# Patient Record
Sex: Male | Born: 1935
Health system: Southern US, Community
[De-identification: ages and names within clinical notes are randomized; demographics above are authoritative.]

## PROBLEM LIST (undated history)

## (undated) DIAGNOSIS — N400 Enlarged prostate without lower urinary tract symptoms: Secondary | ICD-10-CM

## (undated) DIAGNOSIS — R0600 Dyspnea, unspecified: Secondary | ICD-10-CM

## (undated) DIAGNOSIS — I499 Cardiac arrhythmia, unspecified: Secondary | ICD-10-CM

## (undated) DIAGNOSIS — N529 Male erectile dysfunction, unspecified: Secondary | ICD-10-CM

## (undated) DIAGNOSIS — E669 Obesity, unspecified: Secondary | ICD-10-CM

## (undated) DIAGNOSIS — I10 Essential (primary) hypertension: Secondary | ICD-10-CM

## (undated) DIAGNOSIS — N189 Chronic kidney disease, unspecified: Secondary | ICD-10-CM

## (undated) DIAGNOSIS — Z8489 Family history of other specified conditions: Secondary | ICD-10-CM

## (undated) DIAGNOSIS — D649 Anemia, unspecified: Secondary | ICD-10-CM

## (undated) DIAGNOSIS — E119 Type 2 diabetes mellitus without complications: Secondary | ICD-10-CM

## (undated) DIAGNOSIS — J189 Pneumonia, unspecified organism: Secondary | ICD-10-CM

## (undated) HISTORY — DX: Benign prostatic hyperplasia without lower urinary tract symptoms: N40.0

## (undated) HISTORY — PX: HERNIA REPAIR: SHX51

## (undated) HISTORY — DX: Cardiac arrhythmia, unspecified: I49.9

## (undated) HISTORY — DX: Obesity, unspecified: E66.9

## (undated) HISTORY — PX: OTHER SURGICAL HISTORY: SHX169

## (undated) HISTORY — DX: Anemia, unspecified: D64.9

## (undated) HISTORY — DX: Type 2 diabetes mellitus without complications: E11.9

## (undated) HISTORY — DX: Chronic kidney disease, unspecified: N18.9

## (undated) HISTORY — DX: Male erectile dysfunction, unspecified: N52.9

---

## 1997-08-13 ENCOUNTER — Emergency Department (HOSPITAL_COMMUNITY): Admission: EM | Admit: 1997-08-13 | Discharge: 1997-08-13 | Payer: Self-pay | Admitting: Emergency Medicine

## 2000-06-03 ENCOUNTER — Encounter: Payer: Self-pay | Admitting: Family Medicine

## 2000-06-03 ENCOUNTER — Ambulatory Visit (HOSPITAL_COMMUNITY): Admission: RE | Admit: 2000-06-03 | Discharge: 2000-06-03 | Payer: Self-pay | Admitting: Family Medicine

## 2001-09-20 ENCOUNTER — Emergency Department (HOSPITAL_COMMUNITY): Admission: EM | Admit: 2001-09-20 | Discharge: 2001-09-20 | Payer: Self-pay | Admitting: *Deleted

## 2001-09-27 ENCOUNTER — Encounter: Payer: Self-pay | Admitting: Family Medicine

## 2001-09-27 ENCOUNTER — Encounter: Admission: RE | Admit: 2001-09-27 | Discharge: 2001-09-27 | Payer: Self-pay | Admitting: Family Medicine

## 2001-10-15 ENCOUNTER — Emergency Department (HOSPITAL_COMMUNITY): Admission: EM | Admit: 2001-10-15 | Discharge: 2001-10-15 | Payer: Self-pay | Admitting: Emergency Medicine

## 2001-10-15 ENCOUNTER — Encounter: Payer: Self-pay | Admitting: *Deleted

## 2001-10-26 ENCOUNTER — Encounter: Admission: RE | Admit: 2001-10-26 | Discharge: 2001-10-26 | Payer: Self-pay | Admitting: Family Medicine

## 2001-10-26 ENCOUNTER — Encounter: Payer: Self-pay | Admitting: Family Medicine

## 2006-04-25 ENCOUNTER — Encounter: Admission: RE | Admit: 2006-04-25 | Discharge: 2006-04-25 | Payer: Self-pay | Admitting: Emergency Medicine

## 2007-12-08 ENCOUNTER — Emergency Department (HOSPITAL_COMMUNITY): Admission: EM | Admit: 2007-12-08 | Discharge: 2007-12-08 | Payer: Self-pay | Admitting: Emergency Medicine

## 2009-02-08 ENCOUNTER — Emergency Department (HOSPITAL_COMMUNITY): Admission: EM | Admit: 2009-02-08 | Discharge: 2009-02-08 | Payer: Self-pay | Admitting: Family Medicine

## 2010-04-18 ENCOUNTER — Emergency Department (HOSPITAL_COMMUNITY): Payer: Medicare Other

## 2010-04-18 ENCOUNTER — Emergency Department (HOSPITAL_COMMUNITY)
Admission: EM | Admit: 2010-04-18 | Discharge: 2010-04-18 | Disposition: A | Discharge status: Home / Self Care | Payer: Medicare Other | Attending: Emergency Medicine | Admitting: Emergency Medicine

## 2010-04-18 DIAGNOSIS — R29898 Other symptoms and signs involving the musculoskeletal system: Secondary | ICD-10-CM | POA: Insufficient documentation

## 2010-04-18 DIAGNOSIS — R5381 Other malaise: Secondary | ICD-10-CM | POA: Insufficient documentation

## 2010-04-18 DIAGNOSIS — M79609 Pain in unspecified limb: Secondary | ICD-10-CM | POA: Insufficient documentation

## 2010-04-18 DIAGNOSIS — I10 Essential (primary) hypertension: Secondary | ICD-10-CM | POA: Insufficient documentation

## 2010-04-18 DIAGNOSIS — R609 Edema, unspecified: Secondary | ICD-10-CM | POA: Insufficient documentation

## 2010-04-18 DIAGNOSIS — E119 Type 2 diabetes mellitus without complications: Secondary | ICD-10-CM | POA: Insufficient documentation

## 2010-04-18 DIAGNOSIS — M6281 Muscle weakness (generalized): Secondary | ICD-10-CM | POA: Insufficient documentation

## 2010-04-18 DIAGNOSIS — M25469 Effusion, unspecified knee: Secondary | ICD-10-CM | POA: Insufficient documentation

## 2010-04-18 DIAGNOSIS — R5383 Other fatigue: Secondary | ICD-10-CM | POA: Insufficient documentation

## 2010-04-18 DIAGNOSIS — Z79899 Other long term (current) drug therapy: Secondary | ICD-10-CM | POA: Insufficient documentation

## 2010-04-18 DIAGNOSIS — M25569 Pain in unspecified knee: Secondary | ICD-10-CM | POA: Insufficient documentation

## 2010-04-18 LAB — DIFFERENTIAL
Basophils Absolute: 0 10*3/uL (ref 0.0–0.1)
Basophils Relative: 0 % (ref 0–1)
Eosinophils Absolute: 0.1 10*3/uL (ref 0.0–0.7)
Eosinophils Relative: 3 % (ref 0–5)
Lymphocytes Relative: 20 % (ref 12–46)
Lymphs Abs: 1.1 10*3/uL (ref 0.7–4.0)
Monocytes Absolute: 0.6 10*3/uL (ref 0.1–1.0)
Monocytes Relative: 10 % (ref 3–12)
Neutro Abs: 3.8 10*3/uL (ref 1.7–7.7)
Neutrophils Relative %: 67 % (ref 43–77)

## 2010-04-18 LAB — CBC
HCT: 38.3 % — ABNORMAL LOW (ref 39.0–52.0)
Hemoglobin: 13.3 g/dL (ref 13.0–17.0)
MCH: 30.5 pg (ref 26.0–34.0)
MCHC: 34.7 g/dL (ref 30.0–36.0)
MCV: 87.8 fL (ref 78.0–100.0)
Platelets: 190 10*3/uL (ref 150–400)
RBC: 4.36 MIL/uL (ref 4.22–5.81)
RDW: 13.1 % (ref 11.5–15.5)
WBC: 5.7 10*3/uL (ref 4.0–10.5)

## 2010-04-18 LAB — BASIC METABOLIC PANEL
BUN: 14 mg/dL (ref 6–23)
CO2: 23 mEq/L (ref 19–32)
Calcium: 8.7 mg/dL (ref 8.4–10.5)
Chloride: 104 mEq/L (ref 96–112)
Creatinine, Ser: 1.09 mg/dL (ref 0.4–1.5)
GFR calc Af Amer: >60 mL/min (ref >60)
GFR calc non Af Amer: >60 mL/min (ref >60)
Glucose, Bld: 162 mg/dL — ABNORMAL HIGH (ref 70–99)
Potassium: 3.9 mEq/L (ref 3.5–5.1)
Sodium: 138 mEq/L (ref 135–145)

## 2010-04-18 LAB — POCT CARDIAC MARKERS
CKMB, poc: 1.4 ng/mL (ref 1.0–8.0)
Myoglobin, poc: 72.9 ng/mL (ref 12–200)
Troponin i, poc: <0.05 ng/mL (ref 0.00–0.09)

## 2010-04-18 LAB — URINALYSIS, ROUTINE W REFLEX MICROSCOPIC
Bilirubin Urine: NEGATIVE
Glucose, UA: 250 mg/dL — AB
Hgb urine dipstick: NEGATIVE
Ketones, ur: NEGATIVE mg/dL
Nitrite: NEGATIVE
Protein, ur: NEGATIVE mg/dL
Specific Gravity, Urine: 1.02 (ref 1.005–1.030)
Urobilinogen, UA: 0.2 mg/dL (ref 0.0–1.0)
pH: 6 (ref 5.0–8.0)

## 2010-04-18 LAB — GLUCOSE, CAPILLARY: Glucose-Capillary: 166 mg/dL — ABNORMAL HIGH (ref 70–99)

## 2013-10-24 ENCOUNTER — Encounter: Payer: Self-pay | Admitting: *Deleted

## 2014-01-02 ENCOUNTER — Ambulatory Visit
Admission: RE | Admit: 2014-01-02 | Discharge: 2014-01-02 | Disposition: A | Discharge status: Home / Self Care | Payer: Medicare Other | Source: Ambulatory Visit | Attending: Family Medicine | Admitting: Family Medicine

## 2014-01-02 ENCOUNTER — Other Ambulatory Visit: Payer: Self-pay | Admitting: Family Medicine

## 2014-01-02 DIAGNOSIS — J690 Pneumonitis due to inhalation of food and vomit: Secondary | ICD-10-CM

## 2014-09-14 ENCOUNTER — Emergency Department (INDEPENDENT_AMBULATORY_CARE_PROVIDER_SITE_OTHER): Payer: Medicare Other

## 2014-09-14 ENCOUNTER — Encounter (HOSPITAL_COMMUNITY): Payer: Self-pay | Admitting: *Deleted

## 2014-09-14 ENCOUNTER — Emergency Department (INDEPENDENT_AMBULATORY_CARE_PROVIDER_SITE_OTHER)
Admission: EM | Admit: 2014-09-14 | Discharge: 2014-09-14 | Disposition: A | Discharge status: Home / Self Care | Payer: Medicare Other | Source: Home / Self Care | Attending: Emergency Medicine | Admitting: Emergency Medicine

## 2014-09-14 DIAGNOSIS — K5900 Constipation, unspecified: Secondary | ICD-10-CM

## 2014-09-14 MED ORDER — POLYETHYLENE GLYCOL 3350 17 GM/SCOOP PO POWD: 17.0000 g | Freq: Three times a day (TID) | ORAL | Status: DC

## 2014-09-14 NOTE — Discharge Instructions (Signed)
Use MiraLAX 3 times a day until you are having diarrhea. At that time, you can back off on the MiraLAX and use it only as needed. You should have a bowel movement tomorrow. Follow-up as needed.

## 2014-09-14 NOTE — ED Notes (Signed)
Pt  denys  Any  Pain  He  denys  Any  Nausea   Or  Vomiting   -  He  States  Has  Had  No  bm  In 8  Days   he  Has   Not   triea  A  Laxative  But  States  He  Tried  An enema   And    Stool   softners     He  Is  Sitting upright on the  Exam table  In no  Severe  Distress

## 2014-09-14 NOTE — ED Provider Notes (Signed)
CSN: [id number]     Arrival date & time 09/14/14  1545 History   First MD Initiated Contact with Patient 09/14/14 1624     Chief Complaint  Patient presents with  . Constipation   (Consider location/radiation/quality/duration/timing/severity/associated sxs/prior Treatment) HPI He is a 81 year old man here with his wife for evaluation of constipation. He states his last bowel movement was 8 days ago. He states it was normal. He reports some intermittent trouble with constipation in the past, but it typically resolves with an over-the-counter stool softener within a few days. He has been taking stool softeners as well as an enema without improvement. He states he was passing gas up until today. He denies passing any gas today. He reports no appetite, but denies nausea or vomiting. He reports feeling quite bloated.  Past Medical History  Diagnosis Date  . Chronic kidney disease   . Diabetes mellitus without complication   . BPH (benign prostatic hyperplasia)   . ED (erectile dysfunction)   . Obesity   . Anemia    Past Surgical History  Procedure Laterality Date  .  left knee arthrocopy     Family History  Problem Relation Age of Onset  . CVA Father   . Hypertension Father   . Leukemia Sister   . Thyroid disease Sister   . Cancer Brother    Social History  Substance Use Topics  . Smoking status: Former Games developer  . Smokeless tobacco: None  . Alcohol Use: No    Review of Systems As in history of present illness Allergies  Hydrochlorothiazide; Lipitor; and Viagra  Home Medications   Prior to Admission medications   Medication Sig Start Date End Date Taking? Authorizing Provider  amLODipine (NORVASC) 10 MG tablet Take 10 mg by mouth daily.    Historical Provider, MD  aspirin 81 MG chewable tablet Chew by mouth daily.    Historical Provider, MD  benazepril (LOTENSIN) 40 MG tablet Take 40 mg by mouth daily.    Historical Provider, MD  cyclobenzaprine (FLEXERIL) 5 MG tablet Take  5 mg by mouth 3 (three) times daily as needed for muscle spasms.    Historical Provider, MD  glipiZIDE (GLUCOTROL) 10 MG tablet Take 10 mg by mouth daily before breakfast.    Historical Provider, MD  metFORMIN (GLUCOPHAGE) 500 MG tablet Take by mouth 2 (two) times daily with a meal.    Historical Provider, MD  polyethylene glycol powder (GLYCOLAX/MIRALAX) powder Take 17 g by mouth 3 (three) times daily. Until having diarrhea; then use daily as needed. 09/14/14   Charm Rings, MD  rosuvastatin (CRESTOR) 20 MG tablet Take 20 mg by mouth daily.    Historical Provider, MD  tadalafil (CIALIS) 5 MG tablet Take 5 mg by mouth daily as needed for erectile dysfunction.    Historical Provider, MD  vardenafil (LEVITRA) 10 MG tablet Take 10 mg by mouth daily as needed for erectile dysfunction.    Historical Provider, MD  zolpidem (AMBIEN) 10 MG tablet Take 10 mg by mouth at bedtime as needed for sleep.    Historical Provider, MD   Meds Ordered and Administered this Visit  Medications - No data to display  BP 151/68 mmHg  Pulse 71  Temp(Src) 98 F (36.7 C) (Oral)  Resp 20  SpO2 98% No data found.   Physical Exam  Constitutional: He is oriented to person, place, and time. He appears well-developed and well-nourished. No distress.  Neck: Neck supple.  Cardiovascular: Normal rate.  Pulmonary/Chest: Effort normal.  Abdominal: Soft. Bowel sounds are normal. He exhibits distension. He exhibits no mass. There is no tenderness. There is no rebound and no guarding.  Slightly high-pitched bowel sounds  Neurological: He is alert and oriented to person, place, and time.    ED Course  Procedures (including critical care time)  Labs Review Labs Reviewed - No data to display  Imaging Review Dg Abd Acute W/chest  09/14/2014   CLINICAL DATA:  Constipation  EXAM: DG ABDOMEN ACUTE W/ 1V CHEST  COMPARISON:  Chest x-ray 01/02/2014  FINDINGS: Cardiac enlargement. Uncoiled aorta. Negative for heart failure. Negative  for mass or pneumonia. Lungs are clear  Negative for bowel obstruction. Moderate retained stool throughout the colon. No air-fluid levels of bowel. No free air. No urinary tract calcifications. No acute skeletal abnormality.  IMPRESSION: Cardiac enlargement.  No acute cardiopulmonary abnormality  Constipation without bowel obstruction.   Electronically Signed   By: Marlan Palau M.D.   On: 09/14/2014 17:24      MDM   1. Constipation, unspecified constipation type    Treat with MiraLAX. We did discuss appropriate use of an enema if needed. Follow-up as needed.    Charm Rings, MD 09/14/14 [phone]

## 2014-11-27 ENCOUNTER — Other Ambulatory Visit: Payer: Self-pay | Admitting: Urology

## 2014-11-28 ENCOUNTER — Other Ambulatory Visit (HOSPITAL_COMMUNITY): Payer: Self-pay | Admitting: *Deleted

## 2014-11-28 NOTE — Patient Instructions (Addendum)
Thomas BraceRobert L Boehne  11/28/2014   Your procedure is scheduled on: Thursday 12-12-14  [facility]  [facility] 3[facility] floor to  Short Stay Center at 700 AM.  Call this number if you have problems the morning of surgery [phone]   Remember: ONLY 1 PERSON MAY GO WITH YOU TO SHORT STAY TO GET  READY MORNING OF YOUR SURGERY.  Do not eat food or drink liquids :After Midnight.     Take these medicines the morning of surgery with A SIP OF WATER: Amlodipine (Norvasc), Rosuvastatin (Crestor) DO NOT TAKE ANY DIABETIC MEDICATIONS DAY OF YOUR SURGERY                               You may not have any metal on your body including hair pins and              piercings  Do not wear jewelry, make-up, lotions, powders or perfumes, deodorant             Do not wear nail polish.  Do not shave  48 hours prior to surgery.              Men may shave face and neck.   Do not bring valuables to the hospital. [facility] IS NOT             RESPONSIBLE   FOR VALUABLES.  Contacts, dentures or bridgework may not be worn into surgery.  Leave suitcase in the car. After surgery it may be brought to your room.     Patients discharged the day of surgery will not be allowed to drive home.  Name and phone number of your driver:  Special Instructions: practice deep breathing and leg exercises              Please read over the following fact sheets you were given: _____________________________________________________________________             [facility] - Preparing for [facility] Before surgery, you can play an important role.  Because skin is not sterile, your skin needs to be as free of germs as possible.  You can reduce the number of germs on your skin by washing with CHG (chlorahexidine gluconate) soap before surgery.  CHG is an antiseptic cleaner which kills germs and bonds with the skin to continue killing germs even after washing. Please DO NOT use if  you have an allergy to CHG or antibacterial soaps.  If your skin becomes reddened/irritated stop using the CHG and inform your nurse when you arrive at Short Stay. Do not shave (including legs and underarms) for at least 48 hours prior to the first CHG shower.  You may shave your face/neck. Please follow these instructions carefully:  1.  Shower with CHG Soap the night before surgery and the  morning of Surgery.  2.  If you choose to wash your hair, wash your hair first as usual with your  normal  shampoo.  3.  After you shampoo, rinse your hair and body thoroughly to remove the  shampoo.                           4.  Use CHG as you would any other liquid soap.  You can apply chg  directly  to the skin and wash                       Gently with a scrungie or clean washcloth.  5.  Apply the CHG Soap to your body ONLY FROM THE NECK DOWN.   Do not use on face/ open                           Wound or open sores. Avoid contact with eyes, ears mouth and genitals (private parts).                       Wash face,  Genitals (private parts) with your normal soap.             6.  Wash thoroughly, paying special attention to the area where your surgery  will be performed.  7.  Thoroughly rinse your body with warm water from the neck down.  8.  DO NOT shower/wash with your normal soap after using and rinsing off  the CHG Soap.                9.  Pat yourself dry with a clean towel.            10.  Wear clean pajamas.            11.  Place clean sheets on your bed the night of your first shower and do not  sleep with pets. Day of Surgery : Do not apply any lotions/deodorants the morning of surgery.  Please wear clean clothes to the hospital/surgery center.  FAILURE TO FOLLOW THESE INSTRUCTIONS MAY RESULT IN THE CANCELLATION OF YOUR SURGERY PATIENT SIGNATURE_________________________________  NURSE  SIGNATURE__________________________________  ________________________________________________________________________  WHAT IS A BLOOD TRANSFUSION? Blood Transfusion Information  A transfusion is the replacement of blood or some of its parts. Blood is made up of multiple cells which provide different functions.  Red blood cells carry oxygen and are used for blood loss replacement.  White blood cells fight against infection.  Platelets control bleeding.  Plasma helps clot blood.  Other blood products are available for specialized needs, such as hemophilia or other clotting disorders. BEFORE THE TRANSFUSION  Who gives blood for transfusions?   Healthy volunteers who are fully evaluated to make sure their blood is safe. This is blood bank blood. Transfusion therapy is the safest it has ever been in the practice of medicine. Before blood is taken from a donor, a complete history is taken to make sure that person has no history of diseases nor engages in risky social behavior (examples are intravenous drug use or sexual activity with multiple partners). The donor's travel history is screened to minimize risk of transmitting infections, such as malaria. The donated blood is tested for signs of infectious diseases, such as HIV and hepatitis. The blood is then tested to be sure it is compatible with you in order to minimize the chance of a transfusion reaction. If you or a relative donates blood, this is often done in anticipation of surgery and is not appropriate for emergency situations. It takes many days to process the donated blood. RISKS AND COMPLICATIONS Although transfusion therapy is very safe and saves many lives, the main dangers of transfusion include:   Getting an infectious disease.  Developing a transfusion reaction. This is an allergic reaction to something in the blood you were given. Every precaution is taken  to prevent this. The decision to have a blood transfusion has been  considered carefully by your caregiver before blood is given. Blood is not given unless the benefits outweigh the risks. AFTER THE TRANSFUSION  Right after receiving a blood transfusion, you will usually feel much better and more energetic. This is especially true if your red blood cells have gotten low (anemic). The transfusion raises the level of the red blood cells which carry oxygen, and this usually causes an energy increase.  The nurse administering the transfusion will monitor you carefully for complications. HOME CARE INSTRUCTIONS  No special instructions are needed after a transfusion. You may find your energy is better. Speak with your caregiver about any limitations on activity for underlying diseases you may have. SEEK MEDICAL CARE IF:   Your condition is not improving after your transfusion.  You develop redness or irritation at the intravenous (IV) site. SEEK IMMEDIATE MEDICAL CARE IF:  Any of the following symptoms occur over the next 12 hours:  Shaking chills.  You have a temperature by mouth above 102 F (38.9 C), not controlled by medicine.  Chest, back, or muscle pain.  People around you feel you are not acting correctly or are confused.  Shortness of breath or difficulty breathing.  Dizziness and fainting.  You get a rash or develop hives.  You have a decrease in urine output.  Your urine turns a dark color or changes to pink, red, or brown. Any of the following symptoms occur over the next 10 days:  You have a temperature by mouth above 102 F (38.9 C), not controlled by medicine.  Shortness of breath.  Weakness after normal activity.  The white part of the eye turns yellow (jaundice).  You have a decrease in the amount of urine or are urinating less often.  Your urine turns a dark color or changes to pink, red, or brown. Document Released: 12/26/1999 Document Revised: 03/22/2011 Document Reviewed: 08/14/2007 [facility] Patient Information 2014  [place], [place].  _______________________________________________________________________

## 2014-11-29 ENCOUNTER — Encounter (HOSPITAL_COMMUNITY)
Admission: RE | Admit: 2014-11-29 | Discharge: 2014-11-29 | Disposition: A | Discharge status: Home / Self Care | Payer: Medicare Other | Source: Ambulatory Visit | Attending: Urology | Admitting: Urology

## 2014-11-29 ENCOUNTER — Encounter (HOSPITAL_COMMUNITY): Payer: Self-pay

## 2014-11-29 DIAGNOSIS — Z01812 Encounter for preprocedural laboratory examination: Secondary | ICD-10-CM | POA: Diagnosis present

## 2014-11-29 HISTORY — DX: Pneumonia, unspecified organism: J18.9

## 2014-11-29 HISTORY — DX: Essential (primary) hypertension: I10

## 2014-11-29 HISTORY — DX: Family history of other specified conditions: Z84.89

## 2014-11-29 LAB — CBC
HCT: 39.6 % (ref 39.0–52.0)
Hemoglobin: 13.6 g/dL (ref 13.0–17.0)
MCH: 31 pg (ref 26.0–34.0)
MCHC: 34.3 g/dL (ref 30.0–36.0)
MCV: 90.2 fL (ref 78.0–100.0)
Platelets: 204 10*3/uL (ref 150–400)
RBC: 4.39 MIL/uL (ref 4.22–5.81)
RDW: 13.5 % (ref 11.5–15.5)
WBC: 4.5 10*3/uL (ref 4.0–10.5)

## 2014-11-29 NOTE — Progress Notes (Signed)
   11/29/14 0813  OBSTRUCTIVE SLEEP APNEA  Have you ever been diagnosed with sleep apnea through a sleep study? No  Do you snore loudly (loud enough to be heard through closed doors)?  1  Do you often feel tired, fatigued, or sleepy during the daytime (such as falling asleep during driving or talking to someone)? 0  Has anyone observed you stop breathing during your sleep? 1  Do you have, or are you being treated for high blood pressure? 1  BMI more than 35 kg/m2? 1  Age > 50 (1-yes) 1  Neck circumference greater than:Male 16 inches or larger, Male 17inches or larger? 1  Male Gender (Yes=1) 1  Obstructive Sleep Apnea Score 7

## 2014-11-29 NOTE — Pre-Procedure Instructions (Addendum)
CXR 09-14-14 epic EKG 11-18-14 on chart CMET 11-18-14 on chart Hgb A1C, Lipid, TSH 11-18-14 on chart  Will have to get T&S morning of surgery. Pre-op appt is >10 days. Stop Bang score is 7. Routed results to PCP.

## 2014-12-13 NOTE — Progress Notes (Signed)
Spoke with patient by phone, patient aware surgery date changed to December 15th 2016 npo after midnight arrive 1030 am [facility] short stay take amlodipine and crestor sip of water in am.

## 2014-12-26 ENCOUNTER — Encounter (HOSPITAL_COMMUNITY)
Admission: RE | Disposition: A | Discharge status: Home / Self Care | Payer: Self-pay | Source: Ambulatory Visit | Attending: Urology

## 2014-12-26 ENCOUNTER — Encounter (HOSPITAL_COMMUNITY): Payer: Self-pay | Admitting: *Deleted

## 2014-12-26 ENCOUNTER — Ambulatory Visit (HOSPITAL_COMMUNITY): Payer: Medicare Other | Admitting: Registered Nurse

## 2014-12-26 ENCOUNTER — Observation Stay (HOSPITAL_COMMUNITY)
Admission: RE | Admit: 2014-12-26 | Discharge: 2014-12-28 | Disposition: A | Discharge status: Home / Self Care | Payer: Medicare Other | Source: Ambulatory Visit | Attending: Urology | Admitting: Urology

## 2014-12-26 DIAGNOSIS — Z7984 Long term (current) use of oral hypoglycemic drugs: Secondary | ICD-10-CM | POA: Insufficient documentation

## 2014-12-26 DIAGNOSIS — Z7982 Long term (current) use of aspirin: Secondary | ICD-10-CM | POA: Insufficient documentation

## 2014-12-26 DIAGNOSIS — T83091A Other mechanical complication of indwelling urethral catheter, initial encounter: Secondary | ICD-10-CM | POA: Diagnosis not present

## 2014-12-26 DIAGNOSIS — Z79899 Other long term (current) drug therapy: Secondary | ICD-10-CM | POA: Diagnosis not present

## 2014-12-26 DIAGNOSIS — R338 Other retention of urine: Secondary | ICD-10-CM | POA: Diagnosis not present

## 2014-12-26 DIAGNOSIS — N400 Enlarged prostate without lower urinary tract symptoms: Secondary | ICD-10-CM | POA: Diagnosis present

## 2014-12-26 DIAGNOSIS — N401 Enlarged prostate with lower urinary tract symptoms: Secondary | ICD-10-CM | POA: Diagnosis not present

## 2014-12-26 DIAGNOSIS — E1122 Type 2 diabetes mellitus with diabetic chronic kidney disease: Secondary | ICD-10-CM | POA: Diagnosis not present

## 2014-12-26 DIAGNOSIS — R109 Unspecified abdominal pain: Secondary | ICD-10-CM | POA: Diagnosis present

## 2014-12-26 DIAGNOSIS — N189 Chronic kidney disease, unspecified: Secondary | ICD-10-CM | POA: Diagnosis not present

## 2014-12-26 DIAGNOSIS — I129 Hypertensive chronic kidney disease with stage 1 through stage 4 chronic kidney disease, or unspecified chronic kidney disease: Secondary | ICD-10-CM | POA: Insufficient documentation

## 2014-12-26 DIAGNOSIS — Z87891 Personal history of nicotine dependence: Secondary | ICD-10-CM | POA: Diagnosis not present

## 2014-12-26 HISTORY — PX: TRANSURETHRAL RESECTION OF PROSTATE: SHX73

## 2014-12-26 LAB — CBC
HCT: 37 % — ABNORMAL LOW (ref 39.0–52.0)
Hemoglobin: 12.4 g/dL — ABNORMAL LOW (ref 13.0–17.0)
MCH: 30.9 pg (ref 26.0–34.0)
MCHC: 33.5 g/dL (ref 30.0–36.0)
MCV: 92.3 fL (ref 78.0–100.0)
Platelets: 186 10*3/uL (ref 150–400)
RBC: 4.01 MIL/uL — ABNORMAL LOW (ref 4.22–5.81)
RDW: 13.5 % (ref 11.5–15.5)
WBC: 3.6 10*3/uL — ABNORMAL LOW (ref 4.0–10.5)

## 2014-12-26 LAB — BASIC METABOLIC PANEL
Anion gap: 9 (ref 5–15)
BUN: 19 mg/dL (ref 6–20)
CO2: 26 mmol/L (ref 22–32)
Calcium: 8.5 mg/dL — ABNORMAL LOW (ref 8.9–10.3)
Chloride: 108 mmol/L (ref 101–111)
Creatinine, Ser: 1.35 mg/dL — ABNORMAL HIGH (ref 0.61–1.24)
GFR calc Af Amer: 56 mL/min — ABNORMAL LOW (ref >60)
GFR calc non Af Amer: 48 mL/min — ABNORMAL LOW (ref >60)
Glucose, Bld: 111 mg/dL — ABNORMAL HIGH (ref 65–99)
Potassium: 4 mmol/L (ref 3.5–5.1)
Sodium: 143 mmol/L (ref 135–145)

## 2014-12-26 LAB — GLUCOSE, CAPILLARY
Glucose-Capillary: 119 mg/dL — ABNORMAL HIGH (ref 65–99)
Glucose-Capillary: 101 mg/dL — ABNORMAL HIGH (ref 65–99)
Glucose-Capillary: 114 mg/dL — ABNORMAL HIGH (ref 65–99)
Glucose-Capillary: 210 mg/dL — ABNORMAL HIGH (ref 65–99)

## 2014-12-26 SURGERY — TRANSURETHRAL RESECTION OF THE PROSTATE WITH GYRUS INSTRUMENTS
Anesthesia: General

## 2014-12-26 MED ORDER — ROSUVASTATIN CALCIUM 20 MG PO TABS
20.0000 mg | ORAL_TABLET | Freq: Every day | ORAL | Status: DC
  Administered 2014-12-26 – 2014-12-27 (×2): 20 mg via ORAL
  Filled 2014-12-26 (×2): qty 1

## 2014-12-26 MED ORDER — INSULIN ASPART 100 UNIT/ML ~~LOC~~ SOLN
0.0000 [IU] | Freq: Three times a day (TID) | SUBCUTANEOUS | Status: DC
  Administered 2014-12-27: 2 [IU] via SUBCUTANEOUS
  Administered 2014-12-27 (×2): 3 [IU] via SUBCUTANEOUS
  Administered 2014-12-28 (×2): 2 [IU] via SUBCUTANEOUS

## 2014-12-26 MED ORDER — ZOLPIDEM TARTRATE 5 MG PO TABS: 5.0000 mg | ORAL_TABLET | Freq: Every evening | ORAL | Status: DC | PRN

## 2014-12-26 MED ORDER — ASPIRIN 81 MG PO CHEW
81.0000 mg | CHEWABLE_TABLET | Freq: Every day | ORAL | Status: DC
Start: 2014-12-27 — End: 2014-12-28
  Administered 2014-12-28: 81 mg via ORAL
  Filled 2014-12-26: qty 1

## 2014-12-26 MED ORDER — PROPOFOL 10 MG/ML IV BOLUS
INTRAVENOUS | Status: DC | PRN
  Administered 2014-12-26: 200 mg via INTRAVENOUS

## 2014-12-26 MED ORDER — FENTANYL CITRATE (PF) 100 MCG/2ML IJ SOLN
INTRAMUSCULAR | Status: AC
  Filled 2014-12-26: qty 2

## 2014-12-26 MED ORDER — DEXTROSE 5 % IV SOLN
2.0000 g | INTRAVENOUS | Status: DC
  Administered 2014-12-27 – 2014-12-28 (×2): 2 g via INTRAVENOUS
  Filled 2014-12-26 (×2): qty 2

## 2014-12-26 MED ORDER — ONDANSETRON HCL 4 MG/2ML IJ SOLN
4.0000 mg | INTRAMUSCULAR | Status: DC | PRN
  Administered 2014-12-26 – 2014-12-27 (×2): 4 mg via INTRAVENOUS
  Filled 2014-12-26 (×2): qty 2

## 2014-12-26 MED ORDER — SODIUM CHLORIDE 0.9 % IV SOLN
INTRAVENOUS | Status: DC
  Administered 2014-12-26 – 2014-12-28 (×2): via INTRAVENOUS

## 2014-12-26 MED ORDER — DIPHENHYDRAMINE HCL 50 MG/ML IJ SOLN: 12.5000 mg | Freq: Four times a day (QID) | INTRAMUSCULAR | Status: DC | PRN

## 2014-12-26 MED ORDER — LIDOCAINE HCL (CARDIAC) 20 MG/ML IV SOLN
INTRAVENOUS | Status: AC
  Filled 2014-12-26: qty 5

## 2014-12-26 MED ORDER — FENTANYL CITRATE (PF) 100 MCG/2ML IJ SOLN
INTRAMUSCULAR | Status: DC | PRN
  Administered 2014-12-26: 50 ug via INTRAVENOUS
  Administered 2014-12-26: 25 ug via INTRAVENOUS
  Administered 2014-12-26: 50 ug via INTRAVENOUS
  Administered 2014-12-26: 25 ug via INTRAVENOUS

## 2014-12-26 MED ORDER — DIPHENHYDRAMINE HCL 12.5 MG/5ML PO ELIX: 12.5000 mg | ORAL_SOLUTION | Freq: Four times a day (QID) | ORAL | Status: DC | PRN

## 2014-12-26 MED ORDER — LACTATED RINGERS IV SOLN: INTRAVENOUS | Status: DC

## 2014-12-26 MED ORDER — HYDROMORPHONE HCL 1 MG/ML IJ SOLN
0.5000 mg | INTRAMUSCULAR | Status: DC | PRN
  Administered 2014-12-26: 0.5 mg via INTRAVENOUS
  Filled 2014-12-26: qty 1

## 2014-12-26 MED ORDER — ONDANSETRON HCL 4 MG/2ML IJ SOLN
INTRAMUSCULAR | Status: AC
  Filled 2014-12-26: qty 2

## 2014-12-26 MED ORDER — DEXTROSE 5 % IV SOLN
20.0000 mg | INTRAVENOUS | Status: DC | PRN
  Administered 2014-12-26: 10 ug/min via INTRAVENOUS

## 2014-12-26 MED ORDER — OXYCODONE-ACETAMINOPHEN 5-325 MG PO TABS
1.0000 | ORAL_TABLET | ORAL | Status: DC | PRN
  Administered 2014-12-27: 2 via ORAL
  Filled 2014-12-26: qty 2

## 2014-12-26 MED ORDER — ACETAMINOPHEN 325 MG PO TABS
650.0000 mg | ORAL_TABLET | ORAL | Status: DC | PRN
  Administered 2014-12-28: 650 mg via ORAL

## 2014-12-26 MED ORDER — MENTHOL 3 MG MT LOZG
1.0000 | LOZENGE | OROMUCOSAL | Status: DC | PRN
  Filled 2014-12-26 (×2): qty 9

## 2014-12-26 MED ORDER — BENAZEPRIL HCL 10 MG PO TABS
40.0000 mg | ORAL_TABLET | Freq: Every day | ORAL | Status: DC
  Administered 2014-12-26 – 2014-12-28 (×2): 40 mg via ORAL
  Filled 2014-12-26 (×2): qty 4

## 2014-12-26 MED ORDER — SODIUM CHLORIDE 0.9 % IR SOLN
Status: DC | PRN
  Administered 2014-12-26: 18000 mL

## 2014-12-26 MED ORDER — PROPOFOL 10 MG/ML IV BOLUS
INTRAVENOUS | Status: AC
  Filled 2014-12-26: qty 20

## 2014-12-26 MED ORDER — FENTANYL CITRATE (PF) 100 MCG/2ML IJ SOLN: 25.0000 ug | INTRAMUSCULAR | Status: DC | PRN

## 2014-12-26 MED ORDER — ONDANSETRON HCL 4 MG/2ML IJ SOLN
INTRAMUSCULAR | Status: DC | PRN
  Administered 2014-12-26: 4 mg via INTRAVENOUS

## 2014-12-26 MED ORDER — EPHEDRINE SULFATE 50 MG/ML IJ SOLN
INTRAMUSCULAR | Status: DC | PRN
  Administered 2014-12-26 (×2): 5 mg via INTRAVENOUS

## 2014-12-26 MED ORDER — LACTATED RINGERS IV SOLN
INTRAVENOUS | Status: DC | PRN
  Administered 2014-12-26: 12:00:00 via INTRAVENOUS

## 2014-12-26 MED ORDER — AMLODIPINE BESYLATE 10 MG PO TABS
10.0000 mg | ORAL_TABLET | Freq: Every day | ORAL | Status: DC
  Administered 2014-12-26 – 2014-12-28 (×2): 10 mg via ORAL
  Filled 2014-12-26 (×2): qty 1

## 2014-12-26 MED ORDER — LIDOCAINE HCL (CARDIAC) 20 MG/ML IV SOLN
INTRAVENOUS | Status: DC | PRN
  Administered 2014-12-26: 100 mg via INTRAVENOUS

## 2014-12-26 MED ORDER — DEXTROSE 5 % IV SOLN
1.0000 g | INTRAVENOUS | Status: AC
  Administered 2014-12-26: 1 g via INTRAVENOUS
  Filled 2014-12-26: qty 10

## 2014-12-26 MED ORDER — BELLADONNA ALKALOIDS-OPIUM 16.2-60 MG RE SUPP: 1.0000 | Freq: Four times a day (QID) | RECTAL | Status: DC | PRN

## 2014-12-26 SURGICAL SUPPLY — 14 items
BAG URINE DRAINAGE (UROLOGICAL SUPPLIES) ×2 IMPLANT
BAG URO CATCHER STRL LF (MISCELLANEOUS) ×2 IMPLANT
CATH FOLEY 3WAY 30CC 22FR (CATHETERS) ×2 IMPLANT
GLOVE BIO SURGEON STRL SZ8 (GLOVE) ×6 IMPLANT
GOWN STRL REUS W/TWL LRG LVL3 (GOWN DISPOSABLE) ×4 IMPLANT
HOLDER FOLEY CATH W/STRAP (MISCELLANEOUS) IMPLANT
IV NS IRRIG 3000ML ARTHROMATIC (IV SOLUTION) ×4 IMPLANT
LOOP CUT BIPOLAR 24F LRG (ELECTROSURGICAL) ×2 IMPLANT
MANIFOLD NEPTUNE II (INSTRUMENTS) ×2 IMPLANT
PACK CYSTO (CUSTOM PROCEDURE TRAY) ×2 IMPLANT
PLUG CATH AND CAP STER (CATHETERS) ×2 IMPLANT
SYR 30ML LL (SYRINGE) ×2 IMPLANT
SYRINGE IRR TOOMEY STRL 70CC (SYRINGE) ×2 IMPLANT
TUBING CONNECTING 10 (TUBING) ×2 IMPLANT

## 2014-12-26 NOTE — Brief Op Note (Signed)
12/26/2014  2:44 PM  PATIENT:  Almyra Braceobert L Keehan  81 y.o. male  PRE-OPERATIVE DIAGNOSIS:  BENIGN PROSTATIC HYPERPLASIA  POST-OPERATIVE DIAGNOSIS:  BPH  PROCEDURE:  Procedure(s): TRANSURETHRAL RESECTION OF THE PROSTATE (N/A)  SURGEON:  Surgeon(s) and Role:    * Malen GauzePatrick L Lutie Pickler, MD - Primary  PHYSICIAN ASSISTANT:   ASSISTANTS: none   ANESTHESIA:   general  EBL:  Total I/O In: 1250 [I.V.:1200; IV Piggyback:50] Out: 100 [Blood:100]  BLOOD ADMINISTERED:none  DRAINS: Urinary Catheter (Foley)   LOCAL MEDICATIONS USED:  NONE  SPECIMEN:  Source of Specimen:  prostate chips  DISPOSITION OF SPECIMEN:  PATHOLOGY  COUNTS:  YES  TOURNIQUET:  * No tourniquets in log *  DICTATION: .Note written in EPIC  PLAN OF CARE: Admit for overnight observation  PATIENT DISPOSITION:  PACU - hemodynamically stable.   Delay start of Pharmacological VTE agent (>24hrs) due to surgical blood loss or risk of bleeding: not applicable

## 2014-12-26 NOTE — H&P (Signed)
Urology Admission H&P  Chief Complaint: suprapubic pain  History of Present Illness: Mr Thomas Joseph is a 81yo with a hx of BPH with LUTS and urinary retention here for TURP. He has severe LUTS which has been refractory to medical management.   Past Medical History  Diagnosis Date  . Diabetes mellitus without complication (HCC)   . BPH (benign prostatic hyperplasia)   . ED (erectile dysfunction)   . Obesity   . Anemia   . Family history of adverse reaction to anesthesia     son had some problem years ago, pt unsure of what exact problem was  . Hypertension   . Pneumonia   . Chronic kidney disease    Past Surgical History  Procedure Laterality Date  .  left knee arthrocopy    . Hernia repair      Home Medications:  Prescriptions prior to admission  Medication Sig Dispense Refill Last Dose  . amLODipine (NORVASC) 10 MG tablet Take 10 mg by mouth daily.   12/25/2014 at 2300  . aspirin 81 MG chewable tablet Chew 81 mg by mouth daily.    12/12/2014  . benazepril (LOTENSIN) 40 MG tablet Take 40 mg by mouth daily.   12/25/2014 at 2300  . cetirizine (ZYRTEC) 10 MG tablet Take 10 mg by mouth daily as needed for allergies.   Past Week at Unknown time  . COENZYME Q-10 PO Take 1 capsule by mouth daily.   12/12/2014  . glipiZIDE (GLUCOTROL XL) 10 MG 24 hr tablet Take 10 mg by mouth daily with breakfast.   Past Week at Unknown time  . metFORMIN (GLUCOPHAGE-XR) 500 MG 24 hr tablet Take 500 mg by mouth daily with supper.   12/25/2014 at 2300  . polyethylene glycol powder (GLYCOLAX/MIRALAX) powder Take 17 g by mouth 3 (three) times daily. Until having diarrhea; then use daily as needed. (Patient taking differently: Take 17 g by mouth daily as needed (constipation). Until having diarrhea; then use daily as needed.) 255 g 0 12/19/2014  . rosuvastatin (CRESTOR) 20 MG tablet Take 20 mg by mouth at bedtime.    12/19/2014  . tamsulosin (FLOMAX) 0.4 MG CAPS capsule Take 0.4 mg by mouth 2 (two) times daily.    12/25/2014 at Unknown time  . zolpidem (AMBIEN) 10 MG tablet Take 5-10 mg by mouth at bedtime as needed for sleep.    12/24/2014  . cyclobenzaprine (FLEXERIL) 5 MG tablet Take 5 mg by mouth 3 (three) times daily as needed for muscle spasms.   More than a month at Unknown time   Allergies:  Allergies  Allergen Reactions  . Hydrochlorothiazide     Unknown rxn  . Lipitor [Atorvastatin] Other (See Comments)    Muscle pain  . Viagra [Sildenafil Citrate] Palpitations    Family History  Problem Relation Age of Onset  . CVA Father   . Hypertension Father   . Leukemia Sister   . Thyroid disease Sister   . Cancer Brother    Social History:  reports that he quit smoking about 30 years ago. He has never used smokeless tobacco. He reports that he drinks alcohol. He reports that he does not use illicit drugs.  Review of Systems  Genitourinary: Positive for urgency and frequency.  All other systems reviewed and are negative.   Physical Exam:  Vital signs in last 24 hours: Temp:  [97.8 F (36.6 C)] 97.8 F (36.6 C) (12/15 1026) Pulse Rate:  [78] 78 (12/15 1026) Resp:  [18] 18 (12/15  1026) BP: (148)/(64) 148/64 mmHg (12/15 1026) SpO2:  [100 %] 100 % (12/15 1026) Weight:  [118.389 kg (261 lb)] 118.389 kg (261 lb) (12/15 1049) Physical Exam  Constitutional: He is oriented to person, place, and time. He appears well-developed and well-nourished.  HENT:  Head: Normocephalic and atraumatic.  Eyes: EOM are normal. Pupils are equal, round, and reactive to light.  Neck: Normal range of motion. No thyromegaly present.  Cardiovascular: Normal rate and regular rhythm.   Respiratory: Effort normal. No respiratory distress.  GI: Soft. He exhibits no distension.  Musculoskeletal: Normal range of motion.  Neurological: He is alert and oriented to person, place, and time.  Skin: Skin is warm and dry.  Psychiatric: He has a normal mood and affect. His behavior is normal. Judgment and thought content  normal.    Laboratory Data:  Results for orders placed or performed during the hospital encounter of 12/26/14 (from the past 24 hour(s))  Glucose, capillary     Status: Abnormal   Collection Time: 12/26/14 10:30 AM  Result Value Ref Range   Glucose-Capillary 119 (H) 65 - 99 mg/dL   No results found for this or any previous visit (from the past 240 hour(s)). Creatinine: No results for input(s): CREATININE in the last 168 hours. Baseline Creatinine: unknown  Impression/Assessment:  81yo with BPH with LUTS, urinary retention  Plan:  The risks/benefits/alternatives to TURP was explained to the patient and he understands and wishes to proceed with surgery  MCKENZIE, PATRICK L 12/26/2014, 12:21 PM

## 2014-12-26 NOTE — Anesthesia Procedure Notes (Signed)
Procedure Name: LMA Insertion Date/Time: 12/26/2014 12:42 PM Performed by: Jarvis NewcomerARMISTEAD, [name] A Pre-anesthesia Checklist: Patient identified, Emergency Drugs available, Suction available, Patient being monitored and Timeout performed Patient Re-evaluated:Patient Re-evaluated prior to inductionOxygen Delivery Method: Circle system utilized Preoxygenation: Pre-oxygenation with 100% oxygen Intubation Type: IV induction LMA: LMA with gastric port inserted LMA Size: 4.0 Number of attempts: 1 Placement Confirmation: positive ETCO2 and breath sounds checked- equal and bilateral Tube secured with: Tape Dental Injury: Teeth and Oropharynx as per pre-operative assessment

## 2014-12-26 NOTE — Anesthesia Preprocedure Evaluation (Addendum)
Anesthesia Evaluation  Patient identified by MRN, date of birth, ID band Patient awake    Reviewed: Allergy & Precautions, H&P , NPO status , Patient's Chart, lab work & pertinent test results  Airway Mallampati: II  TM Distance: >3 FB Neck ROM: full    Dental  (+) Dental Advisory Given, Edentulous Upper, Edentulous Lower   Pulmonary neg pulmonary ROS, former smoker,    Pulmonary exam normal breath sounds clear to auscultation       Cardiovascular Exercise Tolerance: Good hypertension, Pt. on medications Normal cardiovascular exam Rhythm:regular Rate:Normal     Neuro/Psych negative neurological ROS  negative psych ROS   GI/Hepatic negative GI ROS, Neg liver ROS,   Endo/Other  diabetes, Well Controlled, Type 2, Oral Hypoglycemic Agents  Renal/GU negative Renal ROS  negative genitourinary   Musculoskeletal   Abdominal   Peds  Hematology negative hematology ROS (+)   Anesthesia Other Findings   Reproductive/Obstetrics negative OB ROS                           Anesthesia Physical Anesthesia Plan  ASA: III  Anesthesia Plan: General   Post-op Pain Management:    Induction: Intravenous  Airway Management Planned: LMA  Additional Equipment:   Intra-op Plan:   Post-operative Plan:   Informed Consent: I have reviewed the patients History and Physical, chart, labs and discussed the procedure including the risks, benefits and alternatives for the proposed anesthesia with the patient or authorized representative who has indicated his/her understanding and acceptance.   Dental Advisory Given  Plan Discussed with: CRNA and Surgeon  Anesthesia Plan Comments:         Anesthesia Quick Evaluation

## 2014-12-26 NOTE — Anesthesia Postprocedure Evaluation (Signed)
Anesthesia Post Note  Patient: Almyra BraceRobert L Ehlert  Procedure(s) Performed: Procedure(s) (LRB): TRANSURETHRAL RESECTION OF THE PROSTATE (N/A)  Patient location during evaluation: PACU Anesthesia Type: General Level of consciousness: awake and alert Pain management: pain level controlled Vital Signs Assessment: post-procedure vital signs reviewed and stable Respiratory status: spontaneous breathing, nonlabored ventilation, respiratory function stable and patient connected to nasal cannula oxygen Cardiovascular status: blood pressure returned to baseline and stable Postop Assessment: no signs of nausea or vomiting Anesthetic complications: no    Last Vitals:  Filed Vitals:   12/26/14 1515 12/26/14 1530  BP: 137/70 137/72  Pulse: 58 58  Temp:    Resp: 12 11    Last Pain: There were no vitals filed for this visit.               Morna Flud L

## 2014-12-26 NOTE — Transfer of Care (Signed)
Immediate Anesthesia Transfer of Care Note  Patient: Thomas BraceRobert L Joseph  Procedure(s) Performed: Procedure(s): TRANSURETHRAL RESECTION OF THE PROSTATE (N/A)  Patient Location: PACU  Anesthesia Type:General  Level of Consciousness: awake, alert , oriented and patient cooperative  Airway & Oxygen Therapy: Patient Spontanous Breathing and Patient connected to face mask oxygen  Post-op Assessment: Report given to RN, Post -op Vital signs reviewed and stable and Patient moving all extremities  Post vital signs: Reviewed and stable  Last Vitals:  Filed Vitals:   12/26/14 1026  BP: 148/64  Pulse: 78  Temp: 36.6 C  Resp: 18    Complications: No apparent anesthesia complications

## 2014-12-26 NOTE — Progress Notes (Signed)
PHARMACY NOTE -  Rocephin  Pharmacy has been assisting with dosing of Rocephin for bladder infection. Dosage will remain stable at 2g q24 and need for further dosage adjustment appears unlikely at present.    Will sign off at this time.  Please reconsult if a change in clinical status warrants re-evaluation of dosage.  Hessie KnowsJustin M Matthan Sledge, PharmD, BCPS Pager [phone] 12/26/2014 4:11 PM

## 2014-12-27 DIAGNOSIS — E1122 Type 2 diabetes mellitus with diabetic chronic kidney disease: Secondary | ICD-10-CM | POA: Diagnosis not present

## 2014-12-27 DIAGNOSIS — R338 Other retention of urine: Secondary | ICD-10-CM | POA: Diagnosis not present

## 2014-12-27 DIAGNOSIS — N401 Enlarged prostate with lower urinary tract symptoms: Secondary | ICD-10-CM | POA: Diagnosis not present

## 2014-12-27 DIAGNOSIS — I129 Hypertensive chronic kidney disease with stage 1 through stage 4 chronic kidney disease, or unspecified chronic kidney disease: Secondary | ICD-10-CM | POA: Diagnosis not present

## 2014-12-27 LAB — CBC
HCT: 38.9 % — ABNORMAL LOW (ref 39.0–52.0)
Hemoglobin: 13 g/dL (ref 13.0–17.0)
MCH: 30.7 pg (ref 26.0–34.0)
MCHC: 33.4 g/dL (ref 30.0–36.0)
MCV: 92 fL (ref 78.0–100.0)
Platelets: 195 10*3/uL (ref 150–400)
RBC: 4.23 MIL/uL (ref 4.22–5.81)
RDW: 13.4 % (ref 11.5–15.5)
WBC: 7.6 10*3/uL (ref 4.0–10.5)

## 2014-12-27 LAB — BASIC METABOLIC PANEL
Anion gap: 9 (ref 5–15)
BUN: 18 mg/dL (ref 6–20)
CO2: 25 mmol/L (ref 22–32)
Calcium: 8.4 mg/dL — ABNORMAL LOW (ref 8.9–10.3)
Chloride: 105 mmol/L (ref 101–111)
Creatinine, Ser: 1.26 mg/dL — ABNORMAL HIGH (ref 0.61–1.24)
GFR calc Af Amer: >60 mL/min (ref >60)
GFR calc non Af Amer: 52 mL/min — ABNORMAL LOW (ref >60)
Glucose, Bld: 150 mg/dL — ABNORMAL HIGH (ref 65–99)
Potassium: 4.1 mmol/L (ref 3.5–5.1)
Sodium: 139 mmol/L (ref 135–145)

## 2014-12-27 LAB — GLUCOSE, CAPILLARY
Glucose-Capillary: 144 mg/dL — ABNORMAL HIGH (ref 65–99)
Glucose-Capillary: 165 mg/dL — ABNORMAL HIGH (ref 65–99)
Glucose-Capillary: 161 mg/dL — ABNORMAL HIGH (ref 65–99)
Glucose-Capillary: 177 mg/dL — ABNORMAL HIGH (ref 65–99)

## 2014-12-27 NOTE — Progress Notes (Signed)
Pt standing on the side of the " c/o of dizziness and discomfort in the lower abdomen. Pt states, " I do not feel good", pt started to pass out, eyes rolled back, decreased LOC. Pt quickly aroused to tactile stimulation on chest area. Pt heart rhythm remained in NSR with PVC noted, confirmed with central telemetry monitoring. Rapid response team called, Pt vagal after standing. Pt returned to bed.  BP 163/71 then stable to 147/63,  HR 83-58 NSR with occasional PVC. Pt also c/o of pressure in bladder area, irrigated and clots removed from cath. Foley catheter begin to flow with small clots noted. Remained at the bedside with pt. Will cont to monitor. SRP, RN

## 2014-12-27 NOTE — Op Note (Signed)
Preoperative diagnosis: BPH with LUTS, urinary retention  Postoperative diagnosis: same  Procedure: 1 cystoscopy 2. Transurethral resection of the prostate  Attending: Dornell Grasmick  Anesthesia: General  Estimated blood loss: Minimal  Drains: 22 French foley  Specimens: 1. Prostate Chips  Antibiotics: Rocephin  Findings: Trilobar prostate enlargement. Ureteral orifices in normal anatomic location.   Indications: Patient is a 81-year-old male with a history of BPH and urinary retention.  After discussing treatment options, they decided proceed with transurethral resection of the prostate.  Procedure her in detail: The patient was brought to the operating room and a brief timeout was done to ensure correct patient, correct procedure, correct site.  General anesthesia was administered patient was placed in dorsal lithotomy position.  Their genitalia was then prepped and draped in usual sterile fashion.  A rigid 22 French cystoscope was passed in the urethra and the bladder.  Bladder was inspected and we noted no masses or lesions.  the ureteral orifices were in the normal orthotopic locations. removed the cystoscope and placed a resectoscope into the bladder. We then turned our attention to the prostate resection. Using the bipolar resectoscope we resected the median lobe first from the bladder neck to the verumontanum. We then started at the 12 oclock position on the left lobe and resection to the 6 o'clock position from the bladder neck to the verumontanum. We then did the same resection of the right lobe. Once the resection was complete we then cauterized individual bleeders. We then removed the prostate chips and sent them for pathology.  We then re-inspected the prostatic fossa and found no residual bleeding.  the bladder was then drained, a 22 French foley was placed and this concluded the procedure which was well tolerated by patient.  Complications: None  Condition: Stable,  extubated, transferred to PACU  Plan: Patient is admitted overnight with continuous bladder irrigation. If their urine is clear tomorrow they will be discharged home and followup in 5 days for foley catheter removal and pathology discussion.  

## 2014-12-27 NOTE — Progress Notes (Signed)
Have stayed with patient since vagal event, pt talking able to take sips of liquid, wife remain at the bedside. Pt VSS--147/63 58 O2 sat 94-96 % on RA. NSR  Pt O2 remained on during the entire night shift,. checked pt frequently through the night. Pt without distress until attempt to get up to side of bed. No other issues at the present time. Will cont to monitor. SRP, RN

## 2014-12-27 NOTE — Progress Notes (Signed)
12/27/14 1045  Around 10:15-10:30am  Patient started c/o feeling the same as before this morning when he experienced a vagal episode.  Pt was vomiting, his urine changed from clear to bloody. Spoke with telemetry and around the same time patient was having PVC's/ Bigemy PVC's. Paged Dr Ronne BinningMcKenzie [phone]) waiting a response.

## 2014-12-27 NOTE — Progress Notes (Signed)
1 Day Post-Op Subjective: Patient had vagal event this morning due to a clogged foley. Urine is clear with very slow drip CBI  Objective: Vital signs in last 24 hours: Temp:  [97.2 F (36.2 C)-99 F (37.2 C)] 98.4 F (36.9 C) (12/16 0639) Pulse Rate:  [53-78] 58 (12/16 0639) Resp:  [11-18] 16 (12/16 0639) BP: (137-167)/(61-76) 147/63 mmHg (12/16 0639) SpO2:  [97 %-100 %] 100 % (12/16 0639) Weight:  [118.389 kg (261 lb)] 118.389 kg (261 lb) (12/15 1049)  Intake/Output from previous day: 12/15 0701 - 12/16 0700 In: 12373.7 [P.O.:252; I.V.:1871.7; IV Piggyback:50] Out: [id number] [Urine:12500; Blood:100] Intake/Output this shift: Total I/O In: -  Out: 600 [Urine:600]  Physical Exam:  General:alert, cooperative and appears stated age GI: soft, non tender, normal bowel sounds, no palpable masses, no organomegaly, no inguinal hernia Male genitalia: no penile lesions or discharge no testicular masses no bladder distension noted Extremities: extremities normal, atraumatic, no cyanosis or edema  Lab Results:  Recent Labs  12/26/14 1506 12/27/14 0502  HGB 12.4* 13.0  HCT 37.0* 38.9*   BMET  Recent Labs  12/26/14 1506 12/27/14 0502  NA 143 139  K 4.0 4.1  CL 108 105  CO2 26 25  GLUCOSE 111* 150*  BUN 19 18  CREATININE 1.35* 1.26*  CALCIUM 8.5* 8.4*   No results for input(s): LABPT, INR in the last 72 hours. No results for input(s): LABURIN in the last 72 hours. No results found for this or any previous visit.  Studies/Results: No results found.  Assessment/Plan: 81yo with BPH with retention s/p TURP POD#1  1. CBI discontinued 2. Continue to monitor patient with telemetry given vagal event 3. Likely discharge tomorrow     Christmas Faraci L 12/27/2014, 10:12 AM

## 2014-12-28 DIAGNOSIS — N401 Enlarged prostate with lower urinary tract symptoms: Secondary | ICD-10-CM | POA: Diagnosis not present

## 2014-12-28 LAB — GLUCOSE, CAPILLARY
Glucose-Capillary: 124 mg/dL — ABNORMAL HIGH (ref 65–99)
Glucose-Capillary: 135 mg/dL — ABNORMAL HIGH (ref 65–99)

## 2014-12-28 MED ORDER — DOCUSATE SODIUM 100 MG PO CAPS: 100.0000 mg | ORAL_CAPSULE | Freq: Two times a day (BID) | ORAL | Status: DC

## 2014-12-28 MED ORDER — OXYCODONE-ACETAMINOPHEN 5-325 MG PO TABS: 1.0000 | ORAL_TABLET | ORAL | Status: DC | PRN

## 2014-12-28 NOTE — Progress Notes (Signed)
This morning, RN tried to get patient up to walk. Patient sat on the side of the bed for a few minutes, but felt dizzy. Decided to sit patient in the chair for a little while and when RN came back, patient's dizziness had gone away. RN checked vitals and was unable to get oxygen over 88%, so 2L was put on patient and it brought it up to 93%. Patient was also told to continue to use the IS. Will try to get patient to walk later.

## 2014-12-28 NOTE — Progress Notes (Signed)
Patient got up an ambulated 60 ft this morning on 2L of oxygen. He stated that he only felt slightly dizzy when he got back to his room.

## 2014-12-28 NOTE — Progress Notes (Signed)
2 Days Post-Op Subjective: Patient doing better since yesterday's vagal episode. His Foley is draining well. He has not vomited for almost 24 hours. He does report some dizziness on walking today. This has been improving.  Objective: Vital signs in last 24 hours: Temp:  [98 F (36.7 C)-99.2 F (37.3 C)] 98.6 F (37 C) (12/17 0434) Pulse Rate:  [61-74] 70 (12/17 0434) Resp:  [16-18] 18 (12/17 0434) BP: (124-168)/(49-62) 153/62 mmHg (12/17 0434) SpO2:  [93 %-94 %] 93 % (12/17 0434)  Intake/Output from previous day: 12/16 0701 - 12/17 0700 In: 9156.7 [P.O.:360; I.V.:2446.7; IV Piggyback:50] Out: 9250 [Urine:9250] Intake/Output this shift: Total I/O In: -  Out: 1000 [Urine:1000]  Physical Exam:  General:alert, cooperative and appears stated age GI: soft, non tender, normal bowel sounds, no palpable masses, no organomegaly, no inguinal hernia Foley in place. Clear. Off CBI Extremities: extremities normal, atraumatic, no cyanosis or edema  Lab Results:  Recent Labs  12/26/14 1506 12/27/14 0502  HGB 12.4* 13.0  HCT 37.0* 38.9*   BMET  Recent Labs  12/26/14 1506 12/27/14 0502  NA 143 139  K 4.0 4.1  CL 108 105  CO2 26 25  GLUCOSE 111* 150*  BUN 19 18  CREATININE 1.35* 1.26*  CALCIUM 8.5* 8.4*   No results for input(s): LABPT, INR in the last 72 hours. No results for input(s): LABURIN in the last 72 hours. No results found for this or any previous visit.  Studies/Results: No results found.  Assessment/Plan: 81yo with BPH with retention s/p TURP POD#2  1. Continue foley 2. If patient continues to do well this PM, will d/c home with foley.     Cloyde ReamsBrian James [facility] 12/28/2014, 11:12 AM

## 2014-12-29 NOTE — Discharge Summary (Signed)
Physician Discharge Summary  Patient ID: Thomas BraceRobert L Galgano MRN: [id number] DOB/AGE: 1933-10-20 81 y.o.  Admit date: 12/26/2014 Discharge date: 12/29/2014  Admission Diagnoses: BPH  Discharge Diagnoses:  Active Problems:   BPH (benign prostatic hyperplasia)   Discharged Condition: good  Hospital Course:  81 yo male who is s/p cystoscopy and transurethral resection of the prostate with Dr. Ronne BinningMcKenzie on 12/26/14. The morning of POD#1 he had a vagal event caused a his foley catheter becoming clogged. He recovered from this promptly and his CBI was slowly weaned off prior to discharge. He otherwise did well post-operatively. His diet was slowly advanced and at the time of discharge he was tolerating a regular diet, ambulating at his baseline, making excellent UOP via foley catheter with minimal hematuria off of CBI, and pain was well controlled with oral narcotics. He was discharged to home on POD#2 with the foley catheter in place.  Consults: None  Significant Diagnostic Studies: none  Treatments: surgery: cystoscopy and transurethral resection of the prostate, 12/26/14  Discharge Exam: Blood pressure 148/66, pulse 74, temperature 98.2 F (36.8 C), temperature source Oral, resp. rate 18, height 6' (1.829 m), weight 118.389 kg (261 lb), SpO2 94 %. General:alert, cooperative and appears stated age GI: soft, non tender, normal bowel sounds, no palpable masses, no organomegaly, no inguinal hernia Foley in place. Clear. Off CBI. Extremities: extremities normal, atraumatic, no cyanosis or edema  Disposition: 01-Home or Self Care     Medication List    TAKE these medications        AMBIEN 10 MG tablet  Generic drug:  zolpidem  Take 5-10 mg by mouth at bedtime as needed for sleep.     amLODipine 10 MG tablet  Commonly known as:  NORVASC  Take 10 mg by mouth daily.     aspirin 81 MG chewable tablet  Chew 81 mg by mouth daily.     benazepril 40 MG tablet  Commonly known as:   LOTENSIN  Take 40 mg by mouth daily.     cetirizine 10 MG tablet  Commonly known as:  ZYRTEC  Take 10 mg by mouth daily as needed for allergies.     COENZYME Q-10 PO  Take 1 capsule by mouth daily.     docusate sodium 100 MG capsule  Commonly known as:  COLACE  Take 1 capsule (100 mg total) by mouth 2 (two) times daily.     FLEXERIL 5 MG tablet  Generic drug:  cyclobenzaprine  Take 5 mg by mouth 3 (three) times daily as needed for muscle spasms.     glipiZIDE 10 MG 24 hr tablet  Commonly known as:  GLUCOTROL XL  Take 10 mg by mouth daily with breakfast.     metFORMIN 500 MG 24 hr tablet  Commonly known as:  GLUCOPHAGE-XR  Take 500 mg by mouth daily with supper.     oxyCODONE-acetaminophen 5-325 MG tablet  Commonly known as:  PERCOCET/ROXICET  Take 1-2 tablets by mouth every 4 (four) hours as needed for moderate pain.     polyethylene glycol powder powder  Commonly known as:  GLYCOLAX/MIRALAX  Take 17 g by mouth 3 (three) times daily. Until having diarrhea; then use daily as needed.     rosuvastatin 20 MG tablet  Commonly known as:  CRESTOR  Take 20 mg by mouth at bedtime.     tamsulosin 0.4 MG Caps capsule  Commonly known as:  FLOMAX  Take 0.4 mg by mouth 2 (two) times daily.  Follow-up Information    Follow up with Malen Gauze, MD On 12/31/2014.   Specialty:  Urology   Why:  at 9:15   Contact information:   [address] [place] [address] [phone]       Signed: Darron Doom 12/29/2014, 11:54 AM

## 2017-05-04 ENCOUNTER — Other Ambulatory Visit: Payer: Self-pay | Admitting: Family Medicine

## 2017-05-04 ENCOUNTER — Ambulatory Visit
Admission: RE | Admit: 2017-05-04 | Discharge: 2017-05-04 | Disposition: A | Discharge status: Home / Self Care | Payer: Medicare Other | Source: Ambulatory Visit | Attending: Family Medicine | Admitting: Family Medicine

## 2017-05-04 DIAGNOSIS — R1030 Lower abdominal pain, unspecified: Secondary | ICD-10-CM

## 2017-07-12 ENCOUNTER — Encounter: Payer: Self-pay | Admitting: Sports Medicine

## 2017-07-12 ENCOUNTER — Ambulatory Visit (INDEPENDENT_AMBULATORY_CARE_PROVIDER_SITE_OTHER): Payer: 59 | Admitting: Sports Medicine

## 2017-07-12 DIAGNOSIS — M79675 Pain in left toe(s): Secondary | ICD-10-CM

## 2017-07-12 DIAGNOSIS — B351 Tinea unguium: Secondary | ICD-10-CM

## 2017-07-12 DIAGNOSIS — M79674 Pain in right toe(s): Secondary | ICD-10-CM | POA: Diagnosis not present

## 2017-07-12 DIAGNOSIS — E1151 Type 2 diabetes mellitus with diabetic peripheral angiopathy without gangrene: Secondary | ICD-10-CM

## 2017-07-12 NOTE — Progress Notes (Signed)
Subjective: Thomas Joseph is a 84 y.o. male patient with history of diabetes who presents to office today complaining of long,mildly painful nails  while ambulating in shoes; unable to trim. Patient states that the glucose reading this morning was not recorded. Patient denies any new changes in medication or new problems.   Review of Systems  Cardiovascular: Positive for leg swelling.  All other systems reviewed and are negative.    Patient Active Problem List   Diagnosis Date Noted  . BPH (benign prostatic hyperplasia) 12/26/2014   Current Outpatient Medications on File Prior to Visit  Medication Sig Dispense Refill  . amLODipine (NORVASC) 10 MG tablet Take 10 mg by mouth daily.    Marland Kitchen. aspirin 81 MG chewable tablet Chew 81 mg by mouth daily.     . benazepril (LOTENSIN) 40 MG tablet Take 40 mg by mouth daily.    . cetirizine (ZYRTEC) 10 MG tablet Take 10 mg by mouth daily as needed for allergies.    Marland Kitchen. COENZYME Q-10 PO Take 1 capsule by mouth daily.    . cyclobenzaprine (FLEXERIL) 5 MG tablet Take 5 mg by mouth 3 (three) times daily as needed for muscle spasms.    Marland Kitchen. docusate sodium (COLACE) 100 MG capsule Take 1 capsule (100 mg total) by mouth 2 (two) times daily. 30 capsule 0  . glipiZIDE (GLUCOTROL XL) 10 MG 24 hr tablet Take 10 mg by mouth daily with breakfast.    . metFORMIN (GLUCOPHAGE-XR) 500 MG 24 hr tablet Take 500 mg by mouth daily with supper.    Marland Kitchen. oxyCODONE-acetaminophen (PERCOCET/ROXICET) 5-325 MG tablet Take 1-2 tablets by mouth every 4 (four) hours as needed for moderate pain. 30 tablet 0  . polyethylene glycol powder (GLYCOLAX/MIRALAX) powder Take 17 g by mouth 3 (three) times daily. Until having diarrhea; then use daily as needed. (Patient taking differently: Take 17 g by mouth daily as needed (constipation). Until having diarrhea; then use daily as needed.) 255 g 0  . rosuvastatin (CRESTOR) 20 MG tablet Take 20 mg by mouth at bedtime.     . tamsulosin (FLOMAX) 0.4 MG CAPS  capsule Take 0.4 mg by mouth 2 (two) times daily.    Marland Kitchen. zolpidem (AMBIEN) 10 MG tablet Take 5-10 mg by mouth at bedtime as needed for sleep.      No current facility-administered medications on file prior to visit.    Allergies  Allergen Reactions  . Hydrochlorothiazide     Unknown rxn  . Lipitor [Atorvastatin] Other (See Comments)    Muscle pain  . Viagra [Sildenafil Citrate] Palpitations    No results found for this or any previous visit (from the past 2160 hour(s)).  Objective: General: Patient is awake, alert, and oriented x 3 and in no acute distress.  Integument: Skin is warm, dry and supple bilateral. Nails are tender, long, thickened and dystrophic with subungual debris, consistent with onychomycosis, 1, 3-5 bilateral. No nails on 2nd toes from previous removal. No signs of infection. No open lesions or preulcerative lesions present bilateral. Remaining integument unremarkable.  Vasculature:  Dorsalis Pedis pulse 1/4 bilateral. Posterior Tibial pulse  0/4 bilateral. Capillary fill time <5 sec 1-5 bilateral. No hair growth to the level of the digits. Temperature gradient within normal limits. + varicosities present bilateral. 1+ pitting edema present bilateral.   Neurology: The patient has intact sensation measured with a 5.07/10g Semmes Weinstein Monofilament at all pedal sites bilateral . Vibratory sensation diminished bilateral with tuning fork. No Babinski sign present  bilateral.   Musculoskeletal: Asymptomatic pes planus pedal deformities noted bilateral. Muscular strength 5/5 in all lower extremity muscular groups bilateral without pain on range of motion . No tenderness with calf compression bilateral.  Assessment and Plan: Problem List Items Addressed This Visit    None    Visit Diagnoses    Pain due to onychomycosis of toenails of both feet    -  Primary   Diabetic angiopathy (HCC)          -Examined patient. -Discussed and educated patient on diabetic foot care,  especially with  regards to the vascular, neurological and musculoskeletal systems.  -Stressed the importance of good glycemic control and the detriment of not  controlling glucose levels in relation to the foot. -Mechanically debrided all nails 1-5 bilateral using sterile nail nipper and filed with dremel without incident  -Continue with PCP management for edema -Answered all patient questions -Patient to return  in 3 months for at risk foot care -Patient advised to call the office if any problems or questions arise in the meantime.  Asencion Islam, DPM

## 2017-09-08 ENCOUNTER — Ambulatory Visit (HOSPITAL_COMMUNITY)
Admission: EM | Admit: 2017-09-08 | Discharge: 2017-09-08 | Disposition: A | Discharge status: Home / Self Care | Payer: Medicare Other | Attending: Family Medicine | Admitting: Family Medicine

## 2017-09-08 ENCOUNTER — Encounter (HOSPITAL_COMMUNITY): Payer: Self-pay | Admitting: Emergency Medicine

## 2017-09-08 DIAGNOSIS — R42 Dizziness and giddiness: Secondary | ICD-10-CM | POA: Diagnosis not present

## 2017-09-08 DIAGNOSIS — H81393 Other peripheral vertigo, bilateral: Secondary | ICD-10-CM

## 2017-09-08 LAB — POCT I-STAT, CHEM 8
BUN: 22 mg/dL (ref 8–23)
Calcium, Ion: 1.13 mmol/L — ABNORMAL LOW (ref 1.15–1.40)
Chloride: 104 mmol/L (ref 98–111)
Creatinine, Ser: 1.2 mg/dL (ref 0.61–1.24)
Glucose, Bld: 137 mg/dL — ABNORMAL HIGH (ref 70–99)
HCT: 41 % (ref 39.0–52.0)
Hemoglobin: 13.9 g/dL (ref 13.0–17.0)
Potassium: 3.5 mmol/L (ref 3.5–5.1)
Sodium: 142 mmol/L (ref 135–145)
TCO2: 24 mmol/L (ref 22–32)

## 2017-09-08 MED ORDER — MECLIZINE HCL 12.5 MG PO TABS: 12.5000 mg | ORAL_TABLET | Freq: Three times a day (TID) | ORAL | 0 refills | Status: DC | PRN

## 2017-09-08 NOTE — Discharge Instructions (Signed)
Please use meclizine as needed for dizziness, this will cause drowsiness, please be careful with walking after taking to decrease your fall risk Please perform Epley maneuver onset of multiple times a day to work on getting stents back in place Please drink plenty of fluids to stay hydrated When transitioning from sitting to standing please pause after standing before beginning walking, but symptoms subside Please return here, right to emergency room if developing persistent dizziness, worsening dizziness, changes in vision, difficulty speaking, one-sided weakness, facial drooping, passing out, noticing changes in mentation

## 2017-09-08 NOTE — ED Triage Notes (Signed)
PT reports he woke up at 0500 and felt fine. PT woke up again at 0700 and felt very dizzy. PT reports he feels as though he's going to fall over or fall into the wall. Denies room spinning sensation. PT reports "odd" sensation in head and describes it as a "swelling" sensation. Denies pain in head. PT reports dizziness is worse when changing from a lying to a sitting or standing position. No medication changes in the past 3 weeks.  Grips equal, face symmetrical, no arm drift.   PT reports vision is normal today.

## 2017-09-08 NOTE — ED Provider Notes (Signed)
[facility]    CSN: [id number] Arrival date & time: 09/08/17  0909     History   Chief Complaint Chief Complaint  Patient presents with  . Dizziness    HPI Thomas Joseph is a 84 y.o. male history of BPH, CKD, DM type II, hypertension presenting today for evaluation of dizziness.  Patient states this morning as he got out of bed and went to the bathroom he began to feel an off-balance sensation.  Felt as if he was staggering, and his wife noticed this as well.  His symptoms resolved as he was sitting down.  He requested his wife called EMS.  They did not call EMS, but her presenting care.  Patient states his blood sugars typically around 100-1 20, he did not check it this morning when he was feeling this way.  Denies any changes in vision, chest pain or shortness of breath.  Denies nausea or vomiting.  Denies recent illness.  Denies changes in hearing.  Denies feeling as if he was going to pass out.  Notices his symptoms mainly when he goes from a sitting to standing position.  States that he has been eating and drinking like normal, wife notes that he drinks plenty of fluids.  Wife denies any changes in his mental status from his baseline.  Wife does note that he had similar symptoms on Monday, 3 days ago.  Patient is a previous smoker, but quit approximately 30 years ago.  Denies previous stroke.  Patient does have a history of vertigo.  He denies room spinning today.  HPI  Past Medical History:  Diagnosis Date  . Anemia   . BPH (benign prostatic hyperplasia)   . Chronic kidney disease   . Diabetes mellitus without complication (HCC)   . ED (erectile dysfunction)   . Family history of adverse reaction to anesthesia    son had some problem years ago, pt unsure of what exact problem was  . Hypertension   . Obesity   . Pneumonia     Patient Active Problem List   Diagnosis Date Noted  . BPH (benign prostatic hyperplasia) 12/26/2014    Past Surgical History:    Procedure Laterality Date  .  LEFT KNEE ARTHROCOPY    . HERNIA REPAIR    . TRANSURETHRAL RESECTION OF PROSTATE N/A 12/26/2014   Procedure: TRANSURETHRAL RESECTION OF THE PROSTATE;  Surgeon: Malen Gauze, MD;  Location: [facility];  Service: Urology;  Laterality: N/A;       Home Medications    Prior to Admission medications   Medication Sig Start Date End Date Taking? Authorizing Provider  amLODipine (NORVASC) 10 MG tablet Take 10 mg by mouth daily.    [provider]  aspirin 81 MG chewable tablet Chew 81 mg by mouth daily.     [provider]  benazepril (LOTENSIN) 40 MG tablet Take 40 mg by mouth daily.    [provider]  cetirizine (ZYRTEC) 10 MG tablet Take 10 mg by mouth daily as needed for allergies.    [provider]  COENZYME Q-10 PO Take 1 capsule by mouth daily.    [provider]  cyclobenzaprine (FLEXERIL) 5 MG tablet Take 5 mg by mouth 3 (three) times daily as needed for muscle spasms.    [provider]  docusate sodium (COLACE) 100 MG capsule Take 1 capsule (100 mg total) by mouth 2 (two) times daily. 12/28/14   Hildred Laser, MD  glipiZIDE (GLUCOTROL XL)  10 MG 24 hr tablet Take 10 mg by mouth daily with breakfast.    [provider]  lisinopril-hydrochlorothiazide (PRINZIDE,ZESTORETIC) 20-12.5 MG tablet Take 1 tablet by mouth daily. 05/30/17   [provider]  meclizine (ANTIVERT) 12.5 MG tablet Take 1 tablet (12.5 mg total) by mouth 3 (three) times daily as needed for dizziness. 09/08/17   Phoenix Dresser C, PA-C  metFORMIN (GLUCOPHAGE-XR) 500 MG 24 hr tablet Take 500 mg by mouth daily with supper.    [provider]  oxyCODONE-acetaminophen (PERCOCET/ROXICET) 5-325 MG tablet Take 1-2 tablets by mouth every 4 (four) hours as needed for moderate pain. Patient not taking: Reported on 07/12/2017 12/28/14   Hildred Laser, MD  polyethylene glycol powder (GLYCOLAX/MIRALAX) powder Take  17 g by mouth 3 (three) times daily. Until having diarrhea; then use daily as needed. Patient not taking: Reported on 07/12/2017 09/14/14   Charm Rings, MD  rosuvastatin (CRESTOR) 20 MG tablet Take 20 mg by mouth at bedtime.     [provider]  tadalafil (CIALIS) 5 MG tablet Take 5 mg by mouth daily as needed for erectile dysfunction.    [provider]  tamsulosin (FLOMAX) 0.4 MG CAPS capsule Take 0.4 mg by mouth 2 (two) times daily.    [provider]  zolpidem (AMBIEN) 10 MG tablet Take 5-10 mg by mouth at bedtime as needed for sleep.     [provider]    Family History Family History  Problem Relation Age of Onset  . CVA Father   . Hypertension Father   . Leukemia Sister   . Thyroid disease Sister   . Cancer Brother     Social History Social History   Tobacco Use  . Smoking status: Former Smoker    Last attempt to quit: 11/28/1984    Years since quitting: 32.8  . Smokeless tobacco: Never Used  Substance Use Topics  . Alcohol use: Yes    Comment: occasional glass of wine  . Drug use: No     Allergies   Hydrochlorothiazide; Lipitor [atorvastatin]; and Viagra [sildenafil citrate]   Review of Systems Review of Systems  Constitutional: Negative for activity change, appetite change, fatigue and fever.  HENT: Negative for congestion, sinus pressure and sore throat.   Eyes: Negative for photophobia, pain and visual disturbance.  Respiratory: Negative for cough and shortness of breath.   Cardiovascular: Negative for chest pain.  Gastrointestinal: Negative for abdominal pain, nausea and vomiting.  Genitourinary: Negative for decreased urine volume and hematuria.  Musculoskeletal: Negative for back pain, myalgias, neck pain and neck stiffness.  Skin: Negative for rash.  Neurological: Positive for dizziness, light-headedness and headaches. Negative for syncope, facial asymmetry, speech difficulty, weakness and numbness.     Physical  Exam Triage Vital Signs ED Triage Vitals  Enc Vitals Group     BP 09/08/17 0943 (!) 167/79     Pulse Rate 09/08/17 0943 (!) 55     Resp 09/08/17 0943 16     Temp 09/08/17 0943 97.7 F (36.5 C)     Temp Source 09/08/17 0943 Oral     SpO2 09/08/17 0943 98 %     Weight 09/08/17 0944 238 lb (108 kg)     Height --      Head Circumference --      Peak Flow --      Pain Score 09/08/17 0944 0     Pain Loc --      Pain Edu? --  Excl. in GC? --    Orthostatic VS for the past 24 hrs:  BP- Lying Pulse- Lying BP- Sitting Pulse- Sitting BP- Standing at 0 minutes Pulse- Standing at 0 minutes  09/08/17 1118 (!) 146/93 55 161/83 57 148/81 62    Updated Vital Signs BP (!) 167/79   Pulse (!) 55   Temp 97.7 F (36.5 C) (Oral)   Resp 16   Wt 238 lb (108 kg)   SpO2 98%   BMI 32.28 kg/m   Visual Acuity Right Eye Distance:   Left Eye Distance:   Bilateral Distance:    Right Eye Near:   Left Eye Near:    Bilateral Near:     Physical Exam  Constitutional: He is oriented to person, place, and time. He appears well-developed and well-nourished.  HENT:  Head: Normocephalic and atraumatic.  Bilateral ears without tenderness to palpation of external auricle, tragus and mastoid, EAC's without erythema or swelling, TM's with good bony landmarks and cone of light. Non erythematous.  Oral mucosa pink and moist, no tonsillar enlargement or exudate. Posterior pharynx patent and nonerythematous, no uvula deviation or swelling. Normal phonation.  Eyes: Pupils are equal, round, and reactive to light. Conjunctivae and EOM are normal.  Neck: Normal range of motion. Neck supple.  Full active range of motion of neck, negative Kernig and Brudzinski.  Cardiovascular: Normal rate and regular rhythm.  No murmur heard. Pulmonary/Chest: Effort normal and breath sounds normal. No respiratory distress.  Abdominal: Soft. There is no tenderness.  Musculoskeletal: He exhibits no edema.  Neurological: He is  alert and oriented to person, place, and time.  Patient A&O x3, cranial nerves II-XII grossly intact, strength at shoulders, hips and knees 5/5, equal bilaterally, reflexes difficult to obtain bilaterally patellar and biceps.. Normal Finger to nose, RAM and heel to shin. Negative Romberg and Pronator Drift. Gait without abnormality.  Positive Dix-Hallpike with reproduction of symptoms and horizontal nystagmus  Is able to ambulate from chair to exam table without assistance-did have symptoms of dizziness, but these resolved after sitting  Skin: Skin is warm and dry.  Psychiatric: He has a normal mood and affect.  Nursing note and vitals reviewed.    UC Treatments / Results  Labs (all labs ordered are listed, but only abnormal results are displayed) Labs Reviewed  POCT I-STAT, CHEM 8 - Abnormal; Notable for the following components:      Result Value   Glucose, Bld 137 (*)    Calcium, Ion 1.13 (*)    All other components within normal limits    EKG None  Radiology No results found.  Procedures Procedures (including critical care time)  Medications Ordered in UC Medications - No data to display  Initial Impression / Assessment and Plan / UC Course  I have reviewed the triage vital signs and the nursing notes.  Pertinent labs & imaging results that were available during my care of the patient were reviewed by me and considered in my medical decision making (see chart for details).     EKG sinus bradycardia, previous EKG similar, no arrhythmia noted or signs of ischemia or infarction.  Heart rate previously has been denied as well.  Negative orthostatics, i-STAT relatively normal.  Given symptoms reproducible with Dix-Hallpike, resolving with rest, vital signs stable, no focal neuro deficit, recommending treatment for vertigo with meclizine/Epley maneuver, hydration; with close monitoring for further changes in neuro status.  Discussed this with his wife and patient.  Advised  that definitive ruling out  of intracranial pathology like stroke/hemorrhage cannot be ruled out without CT.  Given Romberg negative, and reasoning above we will try outpatient therapy, but will have patient go to emergency room if having any worsening of symptoms.Discussed strict return precautions. Patient verbalized understanding and is agreeable with plan.  Final Clinical Impressions(s) / UC Diagnoses   Final diagnoses:  Dizziness  Peripheral vertigo of both ears     Discharge Instructions     Please use meclizine as needed for dizziness, this will cause drowsiness, please be careful with walking after taking to decrease your fall risk Please perform Epley maneuver onset of multiple times a day to work on getting stents back in place Please drink plenty of fluids to stay hydrated When transitioning from sitting to standing please pause after standing before beginning walking, but symptoms subside Please return here, right to emergency room if developing persistent dizziness, worsening dizziness, changes in vision, difficulty speaking, one-sided weakness, facial drooping, passing out, noticing changes in mentation    ED Prescriptions    Medication Sig Dispense Auth. Provider   meclizine (ANTIVERT) 12.5 MG tablet Take 1 tablet (12.5 mg total) by mouth 3 (three) times daily as needed for dizziness. 30 tablet Linn Goetze, [place] C, PA-C     Controlled Substance Prescriptions [place] Controlled Substance Registry consulted? Not Applicable   Lew DawesWieters, Jazlynn Nemetz C, [place] JerseyPA-C 09/08/17 1617

## 2017-09-24 ENCOUNTER — Emergency Department (HOSPITAL_COMMUNITY): Payer: Medicare Other

## 2017-09-24 ENCOUNTER — Emergency Department (HOSPITAL_COMMUNITY)
Admission: EM | Admit: 2017-09-24 | Discharge: 2017-09-25 | Disposition: A | Discharge status: Home / Self Care | Payer: Medicare Other | Attending: Emergency Medicine | Admitting: Emergency Medicine

## 2017-09-24 ENCOUNTER — Other Ambulatory Visit: Payer: Self-pay

## 2017-09-24 ENCOUNTER — Encounter (HOSPITAL_COMMUNITY): Payer: Self-pay | Admitting: Emergency Medicine

## 2017-09-24 DIAGNOSIS — Z79899 Other long term (current) drug therapy: Secondary | ICD-10-CM | POA: Insufficient documentation

## 2017-09-24 DIAGNOSIS — Z87891 Personal history of nicotine dependence: Secondary | ICD-10-CM | POA: Diagnosis not present

## 2017-09-24 DIAGNOSIS — Z7984 Long term (current) use of oral hypoglycemic drugs: Secondary | ICD-10-CM | POA: Insufficient documentation

## 2017-09-24 DIAGNOSIS — I129 Hypertensive chronic kidney disease with stage 1 through stage 4 chronic kidney disease, or unspecified chronic kidney disease: Secondary | ICD-10-CM | POA: Insufficient documentation

## 2017-09-24 DIAGNOSIS — E1122 Type 2 diabetes mellitus with diabetic chronic kidney disease: Secondary | ICD-10-CM | POA: Insufficient documentation

## 2017-09-24 DIAGNOSIS — Z7982 Long term (current) use of aspirin: Secondary | ICD-10-CM | POA: Diagnosis not present

## 2017-09-24 DIAGNOSIS — R42 Dizziness and giddiness: Secondary | ICD-10-CM | POA: Diagnosis not present

## 2017-09-24 DIAGNOSIS — N189 Chronic kidney disease, unspecified: Secondary | ICD-10-CM | POA: Insufficient documentation

## 2017-09-24 LAB — COMPREHENSIVE METABOLIC PANEL
ALT: 11 U/L (ref 0–44)
AST: 20 U/L (ref 15–41)
Albumin: 3.7 g/dL (ref 3.5–5.0)
Alkaline Phosphatase: 55 U/L (ref 38–126)
Anion gap: 11 (ref 5–15)
BUN: 21 mg/dL (ref 8–23)
CO2: 25 mmol/L (ref 22–32)
Calcium: 8.8 mg/dL — ABNORMAL LOW (ref 8.9–10.3)
Chloride: 103 mmol/L (ref 98–111)
Creatinine, Ser: 1.51 mg/dL — ABNORMAL HIGH (ref 0.61–1.24)
GFR calc Af Amer: 48 mL/min — ABNORMAL LOW (ref >60)
GFR calc non Af Amer: 41 mL/min — ABNORMAL LOW (ref >60)
Glucose, Bld: 190 mg/dL — ABNORMAL HIGH (ref 70–99)
Potassium: 3.9 mmol/L (ref 3.5–5.1)
Sodium: 139 mmol/L (ref 135–145)
Total Bilirubin: 0.7 mg/dL (ref 0.3–1.2)
Total Protein: 6.9 g/dL (ref 6.5–8.1)

## 2017-09-24 LAB — DIFFERENTIAL
Abs Immature Granulocytes: 0 10*3/uL (ref 0.0–0.1)
Basophils Absolute: 0 10*3/uL (ref 0.0–0.1)
Basophils Relative: 1 %
Eosinophils Absolute: 0.2 10*3/uL (ref 0.0–0.7)
Eosinophils Relative: 5 %
Immature Granulocytes: 0 %
Lymphocytes Relative: 27 %
Lymphs Abs: 1.2 10*3/uL (ref 0.7–4.0)
Monocytes Absolute: 0.5 10*3/uL (ref 0.1–1.0)
Monocytes Relative: 11 %
Neutro Abs: 2.6 10*3/uL (ref 1.7–7.7)
Neutrophils Relative %: 56 %

## 2017-09-24 LAB — CBC
HCT: 39.1 % (ref 39.0–52.0)
Hemoglobin: 13.4 g/dL (ref 13.0–17.0)
MCH: 31.4 pg (ref 26.0–34.0)
MCHC: 34.3 g/dL (ref 30.0–36.0)
MCV: 91.6 fL (ref 78.0–100.0)
Platelets: 217 10*3/uL (ref 150–400)
RBC: 4.27 MIL/uL (ref 4.22–5.81)
RDW: 12.9 % (ref 11.5–15.5)
WBC: 4.6 10*3/uL (ref 4.0–10.5)

## 2017-09-24 LAB — PROTIME-INR
INR: 1.02
Prothrombin Time: 13.3 seconds (ref 11.4–15.2)

## 2017-09-24 LAB — I-STAT CHEM 8, ED
BUN: 24 mg/dL — ABNORMAL HIGH (ref 8–23)
Calcium, Ion: 1.07 mmol/L — ABNORMAL LOW (ref 1.15–1.40)
Chloride: 104 mmol/L (ref 98–111)
Creatinine, Ser: 1.5 mg/dL — ABNORMAL HIGH (ref 0.61–1.24)
Glucose, Bld: 187 mg/dL — ABNORMAL HIGH (ref 70–99)
HCT: 38 % — ABNORMAL LOW (ref 39.0–52.0)
Hemoglobin: 12.9 g/dL — ABNORMAL LOW (ref 13.0–17.0)
Potassium: 3.8 mmol/L (ref 3.5–5.1)
Sodium: 141 mmol/L (ref 135–145)
TCO2: 26 mmol/L (ref 22–32)

## 2017-09-24 LAB — I-STAT TROPONIN, ED: Troponin i, poc: 0 ng/mL (ref 0.00–0.08)

## 2017-09-24 LAB — APTT: aPTT: 30 seconds (ref 24–36)

## 2017-09-24 MED ORDER — DIAZEPAM 2 MG PO TABS
2.0000 mg | ORAL_TABLET | Freq: Once | ORAL | Status: AC
  Administered 2017-09-25: 2 mg via ORAL
  Filled 2017-09-24: qty 1

## 2017-09-24 NOTE — ED Provider Notes (Signed)
[facility] EMERGENCY DEPARTMENT Provider Note   CSN: [id number] Arrival date & time: 09/24/17  2014     History   Chief Complaint Chief Complaint  Patient presents with  . Dizziness    HPI Thomas Joseph is a 84 y.o. male.  84 yo M with a chief complaint of dizziness.  Describes this is a feeling that his eyes are spinning in his head.  He had an episode of this about a week ago and was seen at an urgent care center diagnosed with peripheral vertigo and it improved over the course of 3 days.  He was back to his normal state of health until earlier today when he was at a family reunion and went to the bathroom when he got out he felt like the world was spinning around him.  Was so bad that he could not walk.  It improved somewhat but he was able to get into the car and had a family member drive him home.  The patient's dizziness has improved and now he is feeling much better.  He denies headache or neck pain denies cough or congestion.  The history is provided by the patient and the spouse.  Illness  This is a new problem. The current episode started 3 to 5 hours ago. The problem occurs constantly. The problem has been rapidly improving. Pertinent negatives include no chest pain, no abdominal pain, no headaches and no shortness of breath. Nothing aggravates the symptoms. Nothing relieves the symptoms. He has tried nothing for the symptoms. The treatment provided no relief.    Past Medical History:  Diagnosis Date  . Anemia   . BPH (benign prostatic hyperplasia)   . Chronic kidney disease   . Diabetes mellitus without complication (HCC)   . ED (erectile dysfunction)   . Family history of adverse reaction to anesthesia    son had some problem years ago, pt unsure of what exact problem was  . Hypertension   . Obesity   . Pneumonia     Patient Active Problem List   Diagnosis Date Noted  . BPH (benign prostatic hyperplasia) 12/26/2014    Past Surgical  History:  Procedure Laterality Date  .  LEFT KNEE ARTHROCOPY    . HERNIA REPAIR    . TRANSURETHRAL RESECTION OF PROSTATE N/A 12/26/2014   Procedure: TRANSURETHRAL RESECTION OF THE PROSTATE;  Surgeon: Malen Gauze, MD;  Location: [facility];  Service: Urology;  Laterality: N/A;        Home Medications    Prior to Admission medications   Medication Sig Start Date End Date Taking? Authorizing Provider  acetaminophen (TYLENOL) 325 MG tablet Take 325-650 mg by mouth every 12 (twelve) hours as needed for mild pain or headache.   Yes [provider]  amLODipine (NORVASC) 10 MG tablet Take 10 mg by mouth daily.   Yes [provider]  aspirin 81 MG chewable tablet Chew 81 mg by mouth every other day.    Yes [provider]  benazepril (LOTENSIN) 40 MG tablet Take 40 mg by mouth daily.   Yes [provider]  cetirizine (ZYRTEC) 10 MG tablet Take 10 mg by mouth daily as needed for allergies.   Yes [provider]  COENZYME Q-10 PO Take 1 capsule by mouth daily.   Yes [provider]  lisinopril-hydrochlorothiazide (PRINZIDE,ZESTORETIC) 20-12.5 MG tablet Take 1 tablet by mouth daily. 05/30/17  Yes [provider]  metFORMIN (GLUCOPHAGE-XR) 500 MG 24 hr tablet  Take 1,000 mg by mouth daily.    Yes [provider]  rosuvastatin (CRESTOR) 20 MG tablet Take 20 mg by mouth every 14 (fourteen) days.    Yes [provider]  zolpidem (AMBIEN) 10 MG tablet Take 10 mg by mouth at bedtime as needed for sleep.    Yes [provider]  docusate sodium (COLACE) 100 MG capsule Take 1 capsule (100 mg total) by mouth 2 (two) times daily. Patient not taking: Reported on 09/24/2017 12/28/14   Hildred LaserBudzyn, Brian James, MD  meclizine (ANTIVERT) 12.5 MG tablet Take 1 tablet (12.5 mg total) by mouth 3 (three) times daily as needed for dizziness. Patient not taking: Reported on 09/24/2017 09/08/17   Wieters, Fran LowesHallie C, PA-C    oxyCODONE-acetaminophen (PERCOCET/ROXICET) 5-325 MG tablet Take 1-2 tablets by mouth every 4 (four) hours as needed for moderate pain. Patient not taking: Reported on 09/24/2017 12/28/14   Hildred LaserBudzyn, Brian James, MD  polyethylene glycol powder (GLYCOLAX/MIRALAX) powder Take 17 g by mouth 3 (three) times daily. Until having diarrhea; then use daily as needed. Patient not taking: Reported on 09/24/2017 09/14/14   Charm RingsHonig, Erin J, MD  tadalafil (CIALIS) 5 MG tablet Take 5 mg by mouth daily as needed for erectile dysfunction.    [provider]    Family History Family History  Problem Relation Age of Onset  . CVA Father   . Hypertension Father   . Leukemia Sister   . Thyroid disease Sister   . Cancer Brother     Social History Social History   Tobacco Use  . Smoking status: Former Smoker    Last attempt to quit: 11/28/1984    Years since quitting: 32.8  . Smokeless tobacco: Never Used  Substance Use Topics  . Alcohol use: Yes    Comment: occasional glass of wine  . Drug use: No     Allergies   Hydrochlorothiazide; Lipitor [atorvastatin]; and Viagra [sildenafil citrate]   Review of Systems Review of Systems  Constitutional: Negative for chills and fever.  HENT: Negative for congestion and facial swelling.   Eyes: Negative for discharge and visual disturbance.  Respiratory: Negative for shortness of breath.   Cardiovascular: Negative for chest pain and palpitations.  Gastrointestinal: Negative for abdominal pain, diarrhea and vomiting.  Musculoskeletal: Negative for arthralgias and myalgias.  Skin: Negative for color change and rash.  Neurological: Positive for dizziness. Negative for tremors, syncope and headaches.  Psychiatric/Behavioral: Negative for confusion and dysphoric mood.     Physical Exam Updated Vital Signs BP (!) 170/77   Pulse (!) 59   Temp 97.7 F (36.5 C) (Oral)   Resp 15   SpO2 99%   Physical Exam  Constitutional: He is oriented to person,  place, and time. He appears well-developed and well-nourished.  HENT:  Head: Normocephalic and atraumatic.  Eyes: Pupils are equal, round, and reactive to light. EOM are normal.  Neck: Normal range of motion. Neck supple. No JVD present.  Cardiovascular: Normal rate and regular rhythm. Exam reveals no gallop and no friction rub.  No murmur heard. Pulmonary/Chest: No respiratory distress. He has no wheezes.  Abdominal: He exhibits no distension. There is no rebound and no guarding.  Musculoskeletal: Normal range of motion.  Neurological: He is alert and oriented to person, place, and time. He has normal strength. No cranial nerve deficit or sensory deficit. Coordination and gait normal.  Slow steady gait  Skin: No rash noted. No pallor.  Psychiatric: He has a normal mood and  affect. His behavior is normal.  Nursing note and vitals reviewed.    ED Treatments / Results  Labs (all labs ordered are listed, but only abnormal results are displayed) Labs Reviewed  COMPREHENSIVE METABOLIC PANEL - Abnormal; Notable for the following components:      Result Value   Glucose, Bld 190 (*)    Creatinine, Ser 1.51 (*)    Calcium 8.8 (*)    GFR calc non Af Amer 41 (*)    GFR calc Af Amer 48 (*)    All other components within normal limits  I-STAT CHEM 8, ED - Abnormal; Notable for the following components:   BUN 24 (*)    Creatinine, Ser 1.50 (*)    Glucose, Bld 187 (*)    Calcium, Ion 1.07 (*)    Hemoglobin 12.9 (*)    HCT 38.0 (*)    All other components within normal limits  PROTIME-INR  APTT  CBC  DIFFERENTIAL  I-STAT TROPONIN, ED  CBG MONITORING, ED    EKG EKG Interpretation  Date/Time:  Saturday September 24 2017 20:37:11 EDT Ventricular Rate:  67 PR Interval:  184 QRS Duration: 84 QT Interval:  394 QTC Calculation: 416 R Axis:   -19 Text Interpretation:  Normal sinus rhythm Normal ECG No significant change since last tracing Confirmed by Melene Plan [phone]) on 09/24/2017  9:10:21 PM   Radiology Ct Head Wo Contrast  Result Date: 09/24/2017 CLINICAL DATA:  Vertigo. EXAM: CT HEAD WITHOUT CONTRAST TECHNIQUE: Contiguous axial images were obtained from the base of the skull through the vertex without intravenous contrast. COMPARISON:  04/18/2010 FINDINGS: Brain: Mild for age low density in the periventricular white matter likely related to small vessel disease. No hydrocephalus, hemorrhage, intra-axial, or extra-axial fluid collection. Subtle hypoattenuation within the anterior medulla including on images 7 and 8 of series 3. Vascular: Intracranial atherosclerosis. Skull: Normal Sinuses/Orbits: Normal imaged portions of the orbits and globes. Mild left maxillary sinus mucosal thickening. Partial left mastoid opacification is similar to on the prior. Other: None. IMPRESSION: 1. Subtle hypoattenuation within the anterior medulla could be artifactual. If the patient has localizing symptoms in this region, consider further evaluation with MRI to exclude ischemia. 2. Otherwise, no acute intracranial abnormality. 3. Chronic left-sided mastoid effusion. Minimal left maxillary sinus mucosal thickening. Electronically Signed   By: Jeronimo Greaves M.D.   On: 09/24/2017 21:13    Procedures Procedures (including critical care time)  Medications Ordered in ED Medications  diazepam (VALIUM) tablet 2 mg (has no administration in time range)     Initial Impression / Assessment and Plan / ED Course  I have reviewed the triage vital signs and the nursing notes.  Pertinent labs & imaging results that were available during my care of the patient were reviewed by me and considered in my medical decision making (see chart for details).     84 yo M with a chief complaint of dizziness.  This started when the patient went to the bathroom at a family reunion.  He started walking back and felt like he could not walk felt like the world was spinning around him and he had to stop and lay down and  it improved his symptoms somewhat.  Eventually was able to get into his car and was driven home.  His dizziness had somewhat improved but he was still having some so he came to the ED to be evaluated.  On my exam he has no focal neurologic deficit.  He is currently  able to ambulate with slow steady steps which is his baseline.  He had a CT of the head that was performed in triage that was concerning for hypoattenuation of the anterior medulla, will discuss with neurology.  I discussed the case with Dr. Otelia Limes, he independently reviewed the CT scan images and recommended an MRI to fully evaluate. The patients results and plan were reviewed and discussed.   Any x-rays performed were independently reviewed by myself.   Differential diagnosis were considered with the presenting HPI.  Medications  diazepam (VALIUM) tablet 2 mg (has no administration in time range)    Vitals:   09/24/17 2200 09/24/17 2215 09/24/17 2230 09/24/17 2245  BP: (!) 176/82 (!) 172/72 (!) 159/72 (!) 170/77  Pulse: 60 (!) 57 (!) 59 (!) 59  Resp: 14 13 16 15   Temp:      TempSrc:      SpO2: 100% 100% 99% 99%    Final diagnoses:  Dizziness      Final Clinical Impressions(s) / ED Diagnoses   Final diagnoses:  Dizziness    ED Discharge Orders    None       Melene Plan, DO 09/24/17 2326

## 2017-09-24 NOTE — ED Triage Notes (Signed)
Pt seen recently at urgent care and diagnosed with vertigo. States symptoms resolved. Today symptoms recurred about 1730.  Dizzy with movement of head and feels unable to walk for fear of falling. Neuro intact in triage.

## 2017-09-25 ENCOUNTER — Emergency Department (HOSPITAL_COMMUNITY): Payer: Medicare Other

## 2017-09-25 DIAGNOSIS — R42 Dizziness and giddiness: Secondary | ICD-10-CM | POA: Diagnosis not present

## 2017-10-11 ENCOUNTER — Encounter: Payer: Self-pay | Admitting: Sports Medicine

## 2017-10-11 ENCOUNTER — Ambulatory Visit (INDEPENDENT_AMBULATORY_CARE_PROVIDER_SITE_OTHER): Payer: Medicare Other | Admitting: Sports Medicine

## 2017-10-11 DIAGNOSIS — B351 Tinea unguium: Secondary | ICD-10-CM | POA: Diagnosis not present

## 2017-10-11 DIAGNOSIS — M79674 Pain in right toe(s): Secondary | ICD-10-CM

## 2017-10-11 DIAGNOSIS — E1151 Type 2 diabetes mellitus with diabetic peripheral angiopathy without gangrene: Secondary | ICD-10-CM

## 2017-10-11 DIAGNOSIS — M79675 Pain in left toe(s): Secondary | ICD-10-CM | POA: Diagnosis not present

## 2017-10-11 NOTE — Progress Notes (Signed)
Subjective: Thomas Joseph is a 84 y.o. male patient with history of diabetes who presents to office today complaining of long,mildly painful nails  while ambulating in shoes; unable to trim. Patient states that the glucose reading this morning was not recorded but yesterday was 118 and last A1c of 7.1. Patient denies any new changes in medication or new problems except his doctor discontinuing the Lotensin medication since he was only on it for a short period of time.  No other issues noted.    Patient Active Problem List   Diagnosis Date Noted  . BPH (benign prostatic hyperplasia) 12/26/2014   Current Outpatient Medications on File Prior to Visit  Medication Sig Dispense Refill  . acetaminophen (TYLENOL) 325 MG tablet Take 325-650 mg by mouth every 12 (twelve) hours as needed for mild pain or headache.    Marland Kitchen amLODipine (NORVASC) 10 MG tablet Take 10 mg by mouth daily.    Marland Kitchen aspirin 81 MG chewable tablet Chew 81 mg by mouth every other day.     . benazepril (LOTENSIN) 40 MG tablet Take 40 mg by mouth daily.    . cetirizine (ZYRTEC) 10 MG tablet Take 10 mg by mouth daily as needed for allergies.    Marland Kitchen COENZYME Q-10 PO Take 1 capsule by mouth daily.    Marland Kitchen docusate sodium (COLACE) 100 MG capsule Take 1 capsule (100 mg total) by mouth 2 (two) times daily. (Patient not taking: Reported on 09/24/2017) 30 capsule 0  . lisinopril-hydrochlorothiazide (PRINZIDE,ZESTORETIC) 20-12.5 MG tablet Take 1 tablet by mouth daily.  4  . meclizine (ANTIVERT) 12.5 MG tablet Take 1 tablet (12.5 mg total) by mouth 3 (three) times daily as needed for dizziness. (Patient not taking: Reported on 09/24/2017) 30 tablet 0  . metFORMIN (GLUCOPHAGE-XR) 500 MG 24 hr tablet Take 1,000 mg by mouth daily.     Marland Kitchen oxyCODONE-acetaminophen (PERCOCET/ROXICET) 5-325 MG tablet Take 1-2 tablets by mouth every 4 (four) hours as needed for moderate pain. (Patient not taking: Reported on 09/24/2017) 30 tablet 0  . polyethylene glycol powder  (GLYCOLAX/MIRALAX) powder Take 17 g by mouth 3 (three) times daily. Until having diarrhea; then use daily as needed. (Patient not taking: Reported on 09/24/2017) 255 g 0  . rosuvastatin (CRESTOR) 20 MG tablet Take 20 mg by mouth every 14 (fourteen) days.     . tadalafil (CIALIS) 5 MG tablet Take 5 mg by mouth daily as needed for erectile dysfunction.    Marland Kitchen zolpidem (AMBIEN) 10 MG tablet Take 10 mg by mouth at bedtime as needed for sleep.      No current facility-administered medications on file prior to visit.    Allergies  Allergen Reactions  . Hydrochlorothiazide Other (See Comments)    Reaction not recalled, believes it just made him urinate very frequently  . Lipitor [Atorvastatin] Other (See Comments)    Muscle pain  . Viagra [Sildenafil Citrate] Palpitations    Recent Results (from the past 2160 hour(s))  I-STAT, chem 8     Status: Abnormal   Collection Time: 09/08/17 10:39 AM  Result Value Ref Range   Sodium 142 135 - 145 mmol/L   Potassium 3.5 3.5 - 5.1 mmol/L   Chloride 104 98 - 111 mmol/L   BUN 22 8 - 23 mg/dL   Creatinine, Ser 1.20 0.61 - 1.24 mg/dL   Glucose, Bld 137 (H) 70 - 99 mg/dL   Calcium, Ion 1.13 (L) 1.15 - 1.40 mmol/L   TCO2 24 22 - 32 mmol/L  Hemoglobin 13.9 13.0 - 17.0 g/dL   HCT 41.0 39.0 - 52.0 %  Protime-INR     Status: None   Collection Time: 09/24/17  8:39 PM  Result Value Ref Range   Prothrombin Time 13.3 11.4 - 15.2 seconds   INR 1.02     Comment: Performed at [facility] [address], [place], [place] [address]  APTT     Status: None   Collection Time: 09/24/17  8:39 PM  Result Value Ref Range   aPTT 30 24 - 36 seconds    Comment: Performed at [facility] [address] [place] [address]  CBC     Status: None   Collection Time: 09/24/17  8:39 PM  Result Value Ref Range   WBC 4.6 4.0 - 10.5 K/uL   RBC 4.27 4.22 - 5.81 MIL/uL   Hemoglobin 13.4 13.0 - 17.0 g/dL   HCT 39.1 39.0 - 52.0 %   MCV 91.6 78.0 - 100.0 fL    MCH 31.4 26.0 - 34.0 pg   MCHC 34.3 30.0 - 36.0 g/dL   RDW 12.9 11.5 - 15.5 %   Platelets 217 150 - 400 K/uL    Comment: Performed at [facility], [place] [address], [place], [place] [address]  Differential     Status: None   Collection Time: 09/24/17  8:39 PM  Result Value Ref Range   Neutrophils Relative % 56 %   Neutro Abs 2.6 1.7 - 7.7 K/uL   Lymphocytes Relative 27 %   Lymphs Abs 1.2 0.7 - 4.0 K/uL   Monocytes Relative 11 %   Monocytes Absolute 0.5 0.1 - 1.0 K/uL   Eosinophils Relative 5 %   Eosinophils Absolute 0.2 0.0 - 0.7 K/uL   Basophils Relative 1 %   Basophils Absolute 0.0 0.0 - 0.1 K/uL   Immature Granulocytes 0 %   Abs Immature Granulocytes 0.0 0.0 - 0.1 K/uL    Comment: Performed at [facility] [address] [place], [place] [address]  Comprehensive metabolic panel     Status: Abnormal   Collection Time: 09/24/17  8:39 PM  Result Value Ref Range   Sodium 139 135 - 145 mmol/L   Potassium 3.9 3.5 - 5.1 mmol/L   Chloride 103 98 - 111 mmol/L   CO2 25 22 - 32 mmol/L   Glucose, Bld 190 (H) 70 - 99 mg/dL   BUN 21 8 - 23 mg/dL   Creatinine, Ser 1.51 (H) 0.61 - 1.24 mg/dL   Calcium 8.8 (L) 8.9 - 10.3 mg/dL   Total Protein 6.9 6.5 - 8.1 g/dL   Albumin 3.7 3.5 - 5.0 g/dL   AST 20 15 - 41 U/L   ALT 11 0 - 44 U/L   Alkaline Phosphatase 55 38 - 126 U/L   Total Bilirubin 0.7 0.3 - 1.2 mg/dL   GFR calc non Af Amer 41 (L) >60 mL/min   GFR calc Af Amer 48 (L) >60 mL/min    Comment: (NOTE) The eGFR has been calculated using the CKD EPI equation. This calculation has not been validated in all clinical situations. eGFR's persistently <60 mL/min signify possible Chronic Kidney Disease.    Anion gap 11 5 - 15    Comment: Performed at [facility] [address], [place],  [address]  I-stat troponin, ED     Status: None   Collection Time: 09/24/17  8:47 PM  Result Value Ref Range  Troponin i, poc 0.00 0.00 - 0.08 ng/mL   Comment 3             Comment: Due to the release kinetics of cTnI, a negative result within the first hours of the onset of symptoms does not rule out myocardial infarction with certainty. If myocardial infarction is still suspected, repeat the test at appropriate intervals.   I-Stat Chem 8, ED     Status: Abnormal   Collection Time: 09/24/17  8:48 PM  Result Value Ref Range   Sodium 141 135 - 145 mmol/L   Potassium 3.8 3.5 - 5.1 mmol/L   Chloride 104 98 - 111 mmol/L   BUN 24 (H) 8 - 23 mg/dL   Creatinine, Ser 1.50 (H) 0.61 - 1.24 mg/dL   Glucose, Bld 187 (H) 70 - 99 mg/dL   Calcium, Ion 1.07 (L) 1.15 - 1.40 mmol/L   TCO2 26 22 - 32 mmol/L   Hemoglobin 12.9 (L) 13.0 - 17.0 g/dL   HCT 38.0 (L) 39.0 - 52.0 %    Objective: General: Patient is awake, alert, and oriented x 3 and in no acute distress.  Integument: Skin is warm, dry and supple bilateral. Nails are tender, long, thickened and dystrophic with subungual debris, consistent with onychomycosis, 1, 3-5 bilateral. No nails on 2nd toes from previous removal. No signs of infection. No open lesions or preulcerative lesions present bilateral. Remaining integument unremarkable.  Vasculature:  Dorsalis Pedis pulse 1/4 bilateral. Posterior Tibial pulse  0/4 bilateral. Capillary fill time <5 sec 1-5 bilateral. No hair growth to the level of the digits. Temperature gradient within normal limits. + varicosities present bilateral. 1+ pitting edema present bilateral.   Neurology: The patient has intact sensation measured with a 5.07/10g Semmes Weinstein Monofilament at all pedal sites bilateral . Vibratory sensation diminished bilateral with tuning fork. No Babinski sign present bilateral.   Musculoskeletal: Asymptomatic pes planus pedal deformities noted bilateral. Muscular strength 5/5 in all lower extremity muscular groups bilateral without pain on range of motion . No tenderness with calf compression bilateral.  Assessment and Plan: Problem List Items  Addressed This Visit    None    Visit Diagnoses    Pain due to onychomycosis of toenails of both feet    -  Primary   Diabetic angiopathy ([place])          -Examined patient. -Discussed and educated patient on diabetic foot care, especially with  regards to the vascular, neurological and musculoskeletal systems.  -Mechanically debrided all nails 1-5 bilateral using sterile nail nipper and filed with dremel without incident  -Continue with PCP management for edema as previously recommendeds -Patient to return  in 3 months for at risk foot care -Patient advised to call the office if any problems or questions arise in the meantime.  Landis Martins, DPM

## 2017-12-17 ENCOUNTER — Encounter (HOSPITAL_COMMUNITY): Payer: Self-pay

## 2017-12-17 ENCOUNTER — Other Ambulatory Visit: Payer: Self-pay

## 2017-12-17 ENCOUNTER — Emergency Department (HOSPITAL_COMMUNITY): Payer: Medicare Other

## 2017-12-17 ENCOUNTER — Emergency Department (HOSPITAL_COMMUNITY)
Admission: EM | Admit: 2017-12-17 | Discharge: 2017-12-17 | Disposition: A | Discharge status: Home / Self Care | Payer: Medicare Other | Attending: Emergency Medicine | Admitting: Emergency Medicine

## 2017-12-17 DIAGNOSIS — Z79899 Other long term (current) drug therapy: Secondary | ICD-10-CM | POA: Insufficient documentation

## 2017-12-17 DIAGNOSIS — R1030 Lower abdominal pain, unspecified: Secondary | ICD-10-CM | POA: Diagnosis not present

## 2017-12-17 DIAGNOSIS — M545 Low back pain, unspecified: Secondary | ICD-10-CM

## 2017-12-17 DIAGNOSIS — Z7982 Long term (current) use of aspirin: Secondary | ICD-10-CM | POA: Diagnosis not present

## 2017-12-17 DIAGNOSIS — Z87891 Personal history of nicotine dependence: Secondary | ICD-10-CM | POA: Diagnosis not present

## 2017-12-17 DIAGNOSIS — Z7984 Long term (current) use of oral hypoglycemic drugs: Secondary | ICD-10-CM | POA: Diagnosis not present

## 2017-12-17 DIAGNOSIS — M549 Dorsalgia, unspecified: Secondary | ICD-10-CM

## 2017-12-17 DIAGNOSIS — M62838 Other muscle spasm: Secondary | ICD-10-CM | POA: Diagnosis not present

## 2017-12-17 DIAGNOSIS — E119 Type 2 diabetes mellitus without complications: Secondary | ICD-10-CM | POA: Insufficient documentation

## 2017-12-17 DIAGNOSIS — I10 Essential (primary) hypertension: Secondary | ICD-10-CM | POA: Diagnosis not present

## 2017-12-17 LAB — CBC WITH DIFFERENTIAL/PLATELET
Abs Immature Granulocytes: 0.01 10*3/uL (ref 0.00–0.07)
Basophils Absolute: 0 10*3/uL (ref 0.0–0.1)
Basophils Relative: 1 %
Eosinophils Absolute: 0.3 10*3/uL (ref 0.0–0.5)
Eosinophils Relative: 6 %
HCT: 39.8 % (ref 39.0–52.0)
Hemoglobin: 13 g/dL (ref 13.0–17.0)
Immature Granulocytes: 0 %
Lymphocytes Relative: 25 %
Lymphs Abs: 1 10*3/uL (ref 0.7–4.0)
MCH: 29.5 pg (ref 26.0–34.0)
MCHC: 32.7 g/dL (ref 30.0–36.0)
MCV: 90.5 fL (ref 80.0–100.0)
Monocytes Absolute: 0.6 10*3/uL (ref 0.1–1.0)
Monocytes Relative: 14 %
Neutro Abs: 2.2 10*3/uL (ref 1.7–7.7)
Neutrophils Relative %: 54 %
Platelets: 220 10*3/uL (ref 150–400)
RBC: 4.4 MIL/uL (ref 4.22–5.81)
RDW: 13 % (ref 11.5–15.5)
WBC: 4.1 10*3/uL (ref 4.0–10.5)
nRBC: 0 % (ref 0.0–0.2)

## 2017-12-17 LAB — COMPREHENSIVE METABOLIC PANEL
ALT: 13 U/L (ref 0–44)
AST: 20 U/L (ref 15–41)
Albumin: 3.6 g/dL (ref 3.5–5.0)
Alkaline Phosphatase: 50 U/L (ref 38–126)
Anion gap: 10 (ref 5–15)
BUN: 18 mg/dL (ref 8–23)
CO2: 23 mmol/L (ref 22–32)
Calcium: 9 mg/dL (ref 8.9–10.3)
Chloride: 105 mmol/L (ref 98–111)
Creatinine, Ser: 1.27 mg/dL — ABNORMAL HIGH (ref 0.61–1.24)
GFR calc Af Amer: >60 mL/min (ref >60)
GFR calc non Af Amer: 52 mL/min — ABNORMAL LOW (ref >60)
Glucose, Bld: 117 mg/dL — ABNORMAL HIGH (ref 70–99)
Potassium: 3.8 mmol/L (ref 3.5–5.1)
Sodium: 138 mmol/L (ref 135–145)
Total Bilirubin: 0.6 mg/dL (ref 0.3–1.2)
Total Protein: 7 g/dL (ref 6.5–8.1)

## 2017-12-17 LAB — URINALYSIS, ROUTINE W REFLEX MICROSCOPIC
Bilirubin Urine: NEGATIVE
Glucose, UA: 50 mg/dL — AB
Hgb urine dipstick: NEGATIVE
Ketones, ur: NEGATIVE mg/dL
Leukocytes, UA: NEGATIVE
Nitrite: NEGATIVE
Protein, ur: NEGATIVE mg/dL
Specific Gravity, Urine: 1.017 (ref 1.005–1.030)
pH: 6 (ref 5.0–8.0)

## 2017-12-17 LAB — LIPASE, BLOOD: Lipase: 33 U/L (ref 11–51)

## 2017-12-17 MED ORDER — IOHEXOL 300 MG/ML  SOLN
100.0000 mL | Freq: Once | INTRAMUSCULAR | Status: AC | PRN
  Administered 2017-12-17: 100 mL via INTRAVENOUS

## 2017-12-17 MED ORDER — LIDOCAINE 5 % EX PTCH
1.0000 | MEDICATED_PATCH | CUTANEOUS | Status: DC
  Administered 2017-12-17: 1 via TRANSDERMAL
  Filled 2017-12-17: qty 1

## 2017-12-17 NOTE — ED Provider Notes (Signed)
[facility] EMERGENCY DEPARTMENT Provider Note   CSN: [id number] Arrival date & time: 12/17/17  1154     History   Chief Complaint No chief complaint on file.   HPI JAMAI DOLCE is a 84 y.o. male.  The history is provided by the patient and medical records. No language interpreter was used.  Back Pain   This is a recurrent problem. The current episode started yesterday. The problem occurs constantly. The problem has not changed since onset.The pain is associated with twisting. The pain is present in the lumbar spine. The quality of the pain is described as shooting and aching. The pain does not radiate. The pain is moderate. The symptoms are aggravated by twisting, bending and certain positions. The pain is the same all the time. Associated symptoms include abdominal pain. Pertinent negatives include no chest pain, no fever, no numbness, no headaches, no abdominal swelling, no bowel incontinence, no perianal numbness, no bladder incontinence, no dysuria, no pelvic pain, no leg pain, no paresthesias, no paresis, no tingling and no weakness. He has tried nothing for the symptoms. The treatment provided no relief.    Past Medical History:  Diagnosis Date  . Anemia   . BPH (benign prostatic hyperplasia)   . Chronic kidney disease   . Diabetes mellitus without complication (HCC)   . ED (erectile dysfunction)   . Family history of adverse reaction to anesthesia    son had some problem years ago, pt unsure of what exact problem was  . Hypertension   . Obesity   . Pneumonia     Patient Active Problem List   Diagnosis Date Noted  . BPH (benign prostatic hyperplasia) 12/26/2014    Past Surgical History:  Procedure Laterality Date  .  LEFT KNEE ARTHROCOPY    . HERNIA REPAIR    . TRANSURETHRAL RESECTION OF PROSTATE N/A 12/26/2014   Procedure: TRANSURETHRAL RESECTION OF THE PROSTATE;  Surgeon: Malen Gauze, MD;  Location: [facility];  Service: Urology;   Laterality: N/A;        Home Medications    Prior to Admission medications   Medication Sig Start Date End Date Taking? Authorizing Provider  acetaminophen (TYLENOL) 325 MG tablet Take 325-650 mg by mouth every 12 (twelve) hours as needed for mild pain or headache.    [provider]  amLODipine (NORVASC) 10 MG tablet Take 10 mg by mouth daily.    [provider]  aspirin 81 MG chewable tablet Chew 81 mg by mouth every other day.     [provider]  benazepril (LOTENSIN) 40 MG tablet Take 40 mg by mouth daily.    [provider]  cetirizine (ZYRTEC) 10 MG tablet Take 10 mg by mouth daily as needed for allergies.    [provider]  COENZYME Q-10 PO Take 1 capsule by mouth daily.    [provider]  docusate sodium (COLACE) 100 MG capsule Take 1 capsule (100 mg total) by mouth 2 (two) times daily. Patient not taking: Reported on 09/24/2017 12/28/14   Hildred Laser, MD  lisinopril-hydrochlorothiazide (PRINZIDE,ZESTORETIC) 20-12.5 MG tablet Take 1 tablet by mouth daily. 05/30/17   [provider]  meclizine (ANTIVERT) 12.5 MG tablet Take 1 tablet (12.5 mg total) by mouth 3 (three) times daily as needed for dizziness. 09/08/17   Wieters, Hallie C, PA-C  metFORMIN (GLUCOPHAGE-XR) 500 MG 24 hr tablet Take 1,000 mg by mouth daily.     [provider]  oxyCODONE-acetaminophen (  PERCOCET/ROXICET) 5-325 MG tablet Take 1-2 tablets by mouth every 4 (four) hours as needed for moderate pain. Patient not taking: Reported on 09/24/2017 12/28/14   Hildred Laser, MD  polyethylene glycol powder (GLYCOLAX/MIRALAX) powder Take 17 g by mouth 3 (three) times daily. Until having diarrhea; then use daily as needed. 09/14/14   Charm Rings, MD  rosuvastatin (CRESTOR) 20 MG tablet Take 20 mg by mouth every 14 (fourteen) days.     [provider]  tadalafil (CIALIS) 5 MG tablet Take 5 mg by mouth daily as needed for erectile  dysfunction.    [provider]  zolpidem (AMBIEN) 10 MG tablet Take 10 mg by mouth at bedtime as needed for sleep.     [provider]    Family History Family History  Problem Relation Age of Onset  . CVA Father   . Hypertension Father   . Leukemia Sister   . Thyroid disease Sister   . Cancer Brother     Social History Social History   Tobacco Use  . Smoking status: Former Smoker    Last attempt to quit: 11/28/1984    Years since quitting: 33.0  . Smokeless tobacco: Never Used  Substance Use Topics  . Alcohol use: Yes    Comment: occasional glass of wine  . Drug use: No     Allergies   Hydrochlorothiazide; Lipitor [atorvastatin]; and Viagra [sildenafil citrate]   Review of Systems Review of Systems  Constitutional: Negative for chills, diaphoresis, fatigue and fever.  HENT: Negative for congestion.   Respiratory: Negative for cough, chest tightness, shortness of breath and wheezing.   Cardiovascular: Negative for chest pain, palpitations and leg swelling.  Gastrointestinal: Positive for abdominal pain. Negative for bowel incontinence, constipation, diarrhea, nausea and vomiting.  Genitourinary: Negative for bladder incontinence, dysuria, flank pain, frequency and pelvic pain.  Musculoskeletal: Positive for back pain. Negative for neck pain and neck stiffness.  Skin: Negative for rash and wound.  Neurological: Negative for tingling, weakness, light-headedness, numbness, headaches and paresthesias.  Psychiatric/Behavioral: Negative for agitation.  All other systems reviewed and are negative.    Physical Exam Updated Vital Signs BP (!) 170/117 (BP Location: Right Arm)   Pulse 60   Temp 97.8 F (36.6 C) (Oral)   Resp 16   Ht 6' (1.829 m)   Wt 108.9 kg   SpO2 99%   BMI 32.55 kg/m   Physical Exam  Constitutional: He is oriented to person, place, and time. He appears well-developed and well-nourished. No distress.  HENT:  Head:  Normocephalic.  Nose: Nose normal.  Mouth/Throat: Oropharynx is clear and moist. No oropharyngeal exudate.  Eyes: Pupils are equal, round, and reactive to light. Conjunctivae and EOM are normal.  Neck: Normal range of motion.  Cardiovascular: Normal rate and intact distal pulses.  No murmur heard. Pulmonary/Chest: Effort normal and breath sounds normal. No stridor. No respiratory distress. He has no wheezes. He has no rales. He exhibits no tenderness.  Abdominal: Soft. Bowel sounds are normal. He exhibits no distension. There is no tenderness.  Musculoskeletal: He exhibits tenderness. He exhibits no edema.       Lumbar back: He exhibits pain and spasm.       Back:  Neurological: He is alert and oriented to person, place, and time. No cranial nerve deficit or sensory deficit. He exhibits normal muscle tone. Coordination normal.  Skin: Skin is warm. Capillary refill takes less than 2 seconds. He is not diaphoretic. No erythema. No  pallor.  Psychiatric: He has a normal mood and affect.  Nursing note and vitals reviewed.    ED Treatments / Results  Labs (all labs ordered are listed, but only abnormal results are displayed) Labs Reviewed  COMPREHENSIVE METABOLIC PANEL - Abnormal; Notable for the following components:      Result Value   Glucose, Bld 117 (*)    Creatinine, Ser 1.27 (*)    GFR calc non Af Amer 52 (*)    All other components within normal limits  URINALYSIS, ROUTINE W REFLEX MICROSCOPIC - Abnormal; Notable for the following components:   Glucose, UA 50 (*)    All other components within normal limits  URINE CULTURE  CBC WITH DIFFERENTIAL/PLATELET  LIPASE, BLOOD    EKG None  Radiology Ct Abdomen Pelvis W Contrast  Result Date: 12/17/2017 CLINICAL DATA:  Low abdominal pain radiating across the back to both sides of the back. EXAM: CT ABDOMEN AND PELVIS WITH CONTRAST TECHNIQUE: Multidetector CT imaging of the abdomen and pelvis was performed using the standard  protocol following bolus administration of intravenous contrast. CONTRAST:  100mL OMNIPAQUE IOHEXOL 300 MG/ML  SOLN COMPARISON:  None. FINDINGS: Lower chest: No acute abnormality. Hepatobiliary: 11 mm hypodense, fluid attenuating left hepatic mass most consistent with a cyst. Tiny 3 mm indeterminate hypodense lesion in the right hepatic lobe. No other solid hepatic mass. No gallstones, gallbladder wall thickening, or biliary dilatation. Pancreas: Unremarkable. No pancreatic ductal dilatation or surrounding inflammatory changes. Spleen: Normal in size without focal abnormality. Adrenals/Urinary Tract: Normal adrenal glands. 7.5 x 7.1 cm hypodense, fluid attenuating right renal mass most consistent with a cyst. Kidneys otherwise unremarkable. No urolithiasis or obstructive uropathy. Normal bladder. Stomach/Bowel: No bowel dilatation to suggest bowel obstruction. No pneumatosis, pneumoperitoneum or portal venous gas. Extensive diverticulosis without evidence of diverticulitis. Mild presacral soft tissue edema primarily around the posterior half of the rectum which may reflect mild proctitis. Normal appendix. Vascular/Lymphatic: No significant vascular findings are present. No enlarged abdominal or pelvic lymph nodes. Reproductive: Prostate is unremarkable. Other: No abdominal wall hernia or abnormality. No abdominopelvic ascites. Musculoskeletal: No acute osseous abnormality. No aggressive osseous lesion. Severe bilateral facet arthropathy L5-S1. Moderate bilateral facet arthropathy at L4-5. Mild osteoarthritis of bilateral sacroiliac joints. IMPRESSION: 1. Mild presacral soft tissue edema primarily around the posterior half of the rectum which may reflect mild proctitis. 2. Extensive diverticulosis without evidence of diverticulitis. Electronically Signed   By: Elige KoHetal  Patel   On: 12/17/2017 15:43   Ct L-spine No Charge  Result Date: 12/17/2017 CLINICAL DATA:  Back pain. EXAM: CT LUMBAR SPINE WITHOUT CONTRAST  TECHNIQUE: Multidetector CT imaging of the lumbar spine was performed without intravenous contrast administration. Multiplanar CT image reconstructions were also generated. COMPARISON:  None. FINDINGS: Segmentation: 5 lumbar type vertebrae. Alignment: Normal. Vertebrae: No acute fracture or focal pathologic process. Generalized osteopenia. Paraspinal and other soft tissues: No acute paraspinal abnormality. Other: Small hiatal hernia. Normal caliber abdominal aorta. Osteoarthritis of bilateral sacroiliac joints. Disc levels: Disc spaces are relatively well maintained. Severe bilateral facet arthropathy at L5-S1 with severe bilateral foraminal stenosis. Moderate bilateral facet arthropathy L4-5. IMPRESSION: 1.  No acute osseous injury of the lumbar spine. 2. Lumbar spine spondylosis as described above. Electronically Signed   By: Elige KoHetal  Patel   On: 12/17/2017 15:30    Procedures Procedures (including critical care time)  Medications Ordered in ED Medications  lidocaine (LIDODERM) 5 % 1 patch (1 patch Transdermal Patch Applied 12/17/17 1622)  iohexol (OMNIPAQUE) 300 MG/ML  solution 100 mL (100 mLs Intravenous Contrast Given 12/17/17 1506)     Initial Impression / Assessment and Plan / ED Course  I have reviewed the triage vital signs and the nursing notes.  Pertinent labs & imaging results that were available during my care of the patient were reviewed by me and considered in my medical decision making (see chart for details).     AARISH ROCKERS is a 84 y.o. male with a past medical history significant for diabetes, hypertension, chronic kidney disease, BPH status post transurethral resection of prostate, and obesity who presents with low back pain and abdominal pain.  Patient reports that he is having low back pain on and off for months that is worsened with movement and twisting.  He reports that last night when getting his fire built he bent over and then popped back up when he had sudden onset  of severe pain across his back.  He reports this was worse than before.  He denies any numbness, tingling, weakness of the legs.  He denies any loss of control of his bowel or bladder.  He reports he is also had some pain wrapping around from his back around towards his abdomen today and reports his urine has been slightly darker.  He denies any hematuria, dysuria, nausea, vomiting, fevers, or chills.  No other recent injuries.  No other complaints.  He describes the pain as up to 10 out of 10 in severity but is currently moderate.  He is able to ambulate.  On exam, patient had no numbness, tingling, or weakness of extremities.  Patient had normal sensation in his legs.  Symmetric radial pulses in upper extremities and DP and PT pulses in his ankles.  No significant edema seen.  No significant abdominal tenderness present.  Patient had tenderness across his low back.  No midline tenderness.  Lungs clear chest is nontender.  Patient otherwise resting comfortably.  Clinically I am most concerned about a musculoskeletal type back pain however given the patient's age of 41 and the acute worsening with bending, patient will have imaging to further evaluate.  Given the abdominal pain he was having earlier, will broaden the CT imaging to include abdomen and pelvis.  Patient will screen laboratory testing.  Imaging shows diverticulosis but no diverticulitis.  Mild proctitis discomfort but no evidence of abscess or perforation.  Patient denied any rectal pain on interview.  Patient had some muscle spasm on his back on reassessment.  CT scan showed no evidence of fracture or dislocation of his back.  Arthritis was seen in his back.  Suspect this is the cause of symptoms and he has associated muscle spasm with the bending and pulling it.  Patient given a Lidoderm patch which was applied and given a prescription for them.  He will follow-up with his PCP for further management.  He also had no other questions or concerns  and understood return precautions.  Patient was discharged in good condition after reassuring work-up.     Final Clinical Impressions(s) / ED Diagnoses   Final diagnoses:  Back pain  Acute bilateral low back pain without sciatica  Lower abdominal pain  Muscle spasm    ED Discharge Orders    None      Clinical Impression: 1. Acute bilateral low back pain without sciatica   2. Back pain   3. Lower abdominal pain   4. Muscle spasm     Disposition: Discharge  Condition: Good  I have discussed the  results, Dx and Tx plan with the pt(& family if present). He/she/they expressed understanding and agree(s) with the plan. Discharge instructions discussed at great length. Strict return precautions discussed and pt &/or family have verbalized understanding of the instructions. No further questions at time of discharge.    Discharge Medication List as of 12/17/2017  4:10 PM      Follow Up: Mila Palmer, MD [address] [place] [address] [phone]     [facility] EMERGENCY DEPARTMENT [address] [id number] mc [place] [address] [phone]       Neelie Welshans, Canary Brim, MD 12/17/17 [phone]

## 2017-12-17 NOTE — Discharge Instructions (Signed)
Your work-up today was overall reassuring.  I suspect you have musculoskeletal type back pain causing your discomfort.  This is also supported by how bending over, straining, and twisting makes it worse.  We did not see any acute fracture or dislocation on the imaging however we did see evidence of the mild diverticulosis in the mild irritation near your rectum.  Please stay hydrated and follow-up with your primary doctor in the next several days.  Please use the patch to help with your discomfort in your back and rest.  If any symptoms change or worsen, please return to the nearest emergency room.

## 2017-12-17 NOTE — ED Notes (Signed)
Patient verbalizes understanding of discharge instructions. Opportunity for questioning and answers were provided. Armband removed by staff, pt discharged from ED.  

## 2017-12-17 NOTE — ED Triage Notes (Signed)
Pt arrives from home with complaints of back pain radiating to his anterior lower abdomen since about a week ago, worse when he stands upright after being bent over for a period of time. Pt reports he has a hx of prostate enlargement and sometimes has urinary retention. Pt states he does not have back pain at this time, family bedside. Pt placed in position of comfort with call bell in reach, bed locked and lowered.

## 2017-12-18 LAB — URINE CULTURE: Culture: NO GROWTH

## 2018-01-17 ENCOUNTER — Ambulatory Visit (INDEPENDENT_AMBULATORY_CARE_PROVIDER_SITE_OTHER): Payer: Medicare Other | Admitting: Podiatry

## 2018-01-17 DIAGNOSIS — E1151 Type 2 diabetes mellitus with diabetic peripheral angiopathy without gangrene: Secondary | ICD-10-CM

## 2018-01-17 DIAGNOSIS — M79674 Pain in right toe(s): Secondary | ICD-10-CM | POA: Diagnosis not present

## 2018-01-17 DIAGNOSIS — M79675 Pain in left toe(s): Secondary | ICD-10-CM

## 2018-01-17 DIAGNOSIS — B351 Tinea unguium: Secondary | ICD-10-CM

## 2018-01-17 NOTE — Patient Instructions (Signed)

## 2018-02-01 ENCOUNTER — Emergency Department (HOSPITAL_COMMUNITY)
Admission: EM | Admit: 2018-02-01 | Discharge: 2018-02-01 | Disposition: A | Discharge status: Home / Self Care | Payer: Medicare Other | Attending: Emergency Medicine | Admitting: Emergency Medicine

## 2018-02-01 ENCOUNTER — Encounter (HOSPITAL_COMMUNITY): Payer: Self-pay | Admitting: Emergency Medicine

## 2018-02-01 ENCOUNTER — Other Ambulatory Visit: Payer: Self-pay

## 2018-02-01 ENCOUNTER — Emergency Department (HOSPITAL_COMMUNITY): Payer: Medicare Other

## 2018-02-01 DIAGNOSIS — R1031 Right lower quadrant pain: Secondary | ICD-10-CM | POA: Diagnosis not present

## 2018-02-01 DIAGNOSIS — Z7984 Long term (current) use of oral hypoglycemic drugs: Secondary | ICD-10-CM | POA: Insufficient documentation

## 2018-02-01 DIAGNOSIS — E1122 Type 2 diabetes mellitus with diabetic chronic kidney disease: Secondary | ICD-10-CM | POA: Insufficient documentation

## 2018-02-01 DIAGNOSIS — Z79899 Other long term (current) drug therapy: Secondary | ICD-10-CM | POA: Diagnosis not present

## 2018-02-01 DIAGNOSIS — I129 Hypertensive chronic kidney disease with stage 1 through stage 4 chronic kidney disease, or unspecified chronic kidney disease: Secondary | ICD-10-CM | POA: Diagnosis not present

## 2018-02-01 DIAGNOSIS — N189 Chronic kidney disease, unspecified: Secondary | ICD-10-CM | POA: Diagnosis not present

## 2018-02-01 DIAGNOSIS — Z87891 Personal history of nicotine dependence: Secondary | ICD-10-CM | POA: Diagnosis not present

## 2018-02-01 DIAGNOSIS — R109 Unspecified abdominal pain: Secondary | ICD-10-CM

## 2018-02-01 DIAGNOSIS — Z7982 Long term (current) use of aspirin: Secondary | ICD-10-CM | POA: Insufficient documentation

## 2018-02-01 LAB — URINALYSIS, ROUTINE W REFLEX MICROSCOPIC
Bilirubin Urine: NEGATIVE
Glucose, UA: >=500 mg/dL — AB
Hgb urine dipstick: NEGATIVE
Ketones, ur: NEGATIVE mg/dL
Leukocytes, UA: NEGATIVE
Nitrite: NEGATIVE
Protein, ur: 30 mg/dL — AB
Specific Gravity, Urine: 1.021 (ref 1.005–1.030)
pH: 5 (ref 5.0–8.0)

## 2018-02-01 LAB — CBC WITH DIFFERENTIAL/PLATELET
Abs Immature Granulocytes: 0.01 10*3/uL (ref 0.00–0.07)
Basophils Absolute: 0 10*3/uL (ref 0.0–0.1)
Basophils Relative: 1 %
Eosinophils Absolute: 0.2 10*3/uL (ref 0.0–0.5)
Eosinophils Relative: 3 %
HCT: 37.9 % — ABNORMAL LOW (ref 39.0–52.0)
Hemoglobin: 12.8 g/dL — ABNORMAL LOW (ref 13.0–17.0)
Immature Granulocytes: 0 %
Lymphocytes Relative: 21 %
Lymphs Abs: 1.1 10*3/uL (ref 0.7–4.0)
MCH: 30.5 pg (ref 26.0–34.0)
MCHC: 33.8 g/dL (ref 30.0–36.0)
MCV: 90.2 fL (ref 80.0–100.0)
Monocytes Absolute: 0.6 10*3/uL (ref 0.1–1.0)
Monocytes Relative: 11 %
Neutro Abs: 3.2 10*3/uL (ref 1.7–7.7)
Neutrophils Relative %: 64 %
Platelets: 206 10*3/uL (ref 150–400)
RBC: 4.2 MIL/uL — ABNORMAL LOW (ref 4.22–5.81)
RDW: 13.1 % (ref 11.5–15.5)
WBC: 5.1 10*3/uL (ref 4.0–10.5)
nRBC: 0 % (ref 0.0–0.2)

## 2018-02-01 LAB — COMPREHENSIVE METABOLIC PANEL
ALT: 15 U/L (ref 0–44)
AST: 25 U/L (ref 15–41)
Albumin: 3.5 g/dL (ref 3.5–5.0)
Alkaline Phosphatase: 51 U/L (ref 38–126)
Anion gap: 9 (ref 5–15)
BUN: 23 mg/dL (ref 8–23)
CO2: 24 mmol/L (ref 22–32)
Calcium: 9 mg/dL (ref 8.9–10.3)
Chloride: 108 mmol/L (ref 98–111)
Creatinine, Ser: 1.49 mg/dL — ABNORMAL HIGH (ref 0.61–1.24)
GFR calc Af Amer: 50 mL/min — ABNORMAL LOW (ref >60)
GFR calc non Af Amer: 43 mL/min — ABNORMAL LOW (ref >60)
Glucose, Bld: 125 mg/dL — ABNORMAL HIGH (ref 70–99)
Potassium: 3.7 mmol/L (ref 3.5–5.1)
Sodium: 141 mmol/L (ref 135–145)
Total Bilirubin: 0.4 mg/dL (ref 0.3–1.2)
Total Protein: 6.8 g/dL (ref 6.5–8.1)

## 2018-02-01 MED ORDER — IBUPROFEN 600 MG PO TABS: 600.0000 mg | ORAL_TABLET | Freq: Three times a day (TID) | ORAL | 0 refills | Status: DC

## 2018-02-01 MED ORDER — IBUPROFEN 400 MG PO TABS
600.0000 mg | ORAL_TABLET | Freq: Once | ORAL | Status: AC
  Administered 2018-02-01: 600 mg via ORAL
  Filled 2018-02-01: qty 1

## 2018-02-01 MED ORDER — TRAMADOL HCL 50 MG PO TABS: 50.0000 mg | ORAL_TABLET | Freq: Four times a day (QID) | ORAL | 0 refills | Status: DC | PRN

## 2018-02-01 MED ORDER — IOHEXOL 300 MG/ML  SOLN
100.0000 mL | Freq: Once | INTRAMUSCULAR | Status: AC | PRN
  Administered 2018-02-01: 100 mL via INTRAVENOUS

## 2018-02-01 MED ORDER — TRAMADOL HCL 50 MG PO TABS
50.0000 mg | ORAL_TABLET | Freq: Once | ORAL | Status: AC
  Administered 2018-02-01: 50 mg via ORAL
  Filled 2018-02-01: qty 1

## 2018-02-01 MED ORDER — SODIUM CHLORIDE 0.9 % IV BOLUS
1000.0000 mL | Freq: Once | INTRAVENOUS | Status: AC
  Administered 2018-02-01: 1000 mL via INTRAVENOUS

## 2018-02-01 NOTE — ED Provider Notes (Signed)
[facility] EMERGENCY DEPARTMENT Provider Note   CSN: [id number] Arrival date & time: 02/01/18  1710     History   Chief Complaint Chief Complaint  Patient presents with  . Flank Pain    HPI Thomas Joseph is a 85 y.o. male.  Patient is an 85 year old male with past medical history of diabetes, chronic renal insufficiency, hypertension.  He presents today for evaluation of right lower quadrant pain.  This started approximately 5 days ago and is worsening.  He had a similar episode 1 month ago with unremarkable work-up.  His pain improved, however is now returning.  He denies any fevers or chills.  He denies any bowel or bladder complaints.  The history is provided by the patient.  Flank Pain  This is a new problem. The problem occurs constantly. The problem has been gradually worsening. Associated symptoms include abdominal pain. Exacerbated by: Movement and raising his right leg. The symptoms are relieved by rest. He has tried nothing for the symptoms.    Past Medical History:  Diagnosis Date  . Anemia   . BPH (benign prostatic hyperplasia)   . Chronic kidney disease   . Diabetes mellitus without complication (HCC)   . ED (erectile dysfunction)   . Family history of adverse reaction to anesthesia    son had some problem years ago, pt unsure of what exact problem was  . Hypertension   . Obesity   . Pneumonia     Patient Active Problem List   Diagnosis Date Noted  . BPH (benign prostatic hyperplasia) 12/26/2014    Past Surgical History:  Procedure Laterality Date  .  LEFT KNEE ARTHROCOPY    . HERNIA REPAIR    . TRANSURETHRAL RESECTION OF PROSTATE N/A 12/26/2014   Procedure: TRANSURETHRAL RESECTION OF THE PROSTATE;  Surgeon: Malen Gauze, MD;  Location: [facility];  Service: Urology;  Laterality: N/A;        Home Medications    Prior to Admission medications   Medication Sig Start Date End Date Taking? Authorizing Provider    acetaminophen (TYLENOL) 325 MG tablet Take 325-650 mg by mouth every 12 (twelve) hours as needed for mild pain or headache.    [provider]  amLODipine (NORVASC) 10 MG tablet Take 10 mg by mouth daily.    [provider]  aspirin 81 MG chewable tablet Chew 81 mg by mouth every other day.     [provider]  benazepril (LOTENSIN) 40 MG tablet Take 40 mg by mouth daily.    [provider]  cetirizine (ZYRTEC) 10 MG tablet Take 10 mg by mouth daily as needed for allergies.    [provider]  COENZYME Q-10 PO Take 1 capsule by mouth daily.    [provider]  docusate sodium (COLACE) 100 MG capsule Take 1 capsule (100 mg total) by mouth 2 (two) times daily. 12/28/14   Hildred Laser, MD  lisinopril-hydrochlorothiazide (PRINZIDE,ZESTORETIC) 20-12.5 MG tablet Take 1 tablet by mouth daily. 05/30/17   [provider]  meclizine (ANTIVERT) 12.5 MG tablet Take 1 tablet (12.5 mg total) by mouth 3 (three) times daily as needed for dizziness. 09/08/17   Wieters, Hallie C, PA-C  metFORMIN (GLUCOPHAGE-XR) 500 MG 24 hr tablet Take 1,000 mg by mouth daily.     [provider]  oxyCODONE-acetaminophen (PERCOCET/ROXICET) 5-325 MG tablet Take 1-2 tablets by mouth every 4 (four) hours as needed for moderate pain. 12/28/14   Hildred Laser, MD  polyethylene glycol powder (GLYCOLAX/MIRALAX) powder Take 17 g by mouth 3 (three) times daily. Until having diarrhea; then use daily as needed. 09/14/14   Charm RingsHonig, Erin J, MD  rosuvastatin (CRESTOR) 20 MG tablet Take 20 mg by mouth every 14 (fourteen) days.     [provider]  tadalafil (CIALIS) 5 MG tablet Take 5 mg by mouth daily as needed for erectile dysfunction.    [provider]  zolpidem (AMBIEN) 10 MG tablet Take 10 mg by mouth at bedtime as needed for sleep.     [provider]    Family History Family History  Problem Relation Age of Onset  . CVA Father   .  Hypertension Father   . Leukemia Sister   . Thyroid disease Sister   . Cancer Brother     Social History Social History   Tobacco Use  . Smoking status: Former Smoker    Last attempt to quit: 11/28/1984    Years since quitting: 33.2  . Smokeless tobacco: Never Used  Substance Use Topics  . Alcohol use: Yes    Comment: occasional glass of wine  . Drug use: No     Allergies   Hydrochlorothiazide; Lipitor [atorvastatin]; and Viagra [sildenafil citrate]   Review of Systems Review of Systems  Gastrointestinal: Positive for abdominal pain.  Genitourinary: Positive for flank pain.  All other systems reviewed and are negative.    Physical Exam Updated Vital Signs BP (!) 159/75 (BP Location: Left Arm)   Pulse 77   Temp 97.7 F (36.5 C) (Oral)   Resp 16   SpO2 98%   Physical Exam Vitals signs and nursing note reviewed.  Constitutional:      General: He is not in acute distress.    Appearance: He is well-developed. He is not diaphoretic.  HENT:     Head: Normocephalic and atraumatic.  Neck:     Musculoskeletal: Normal range of motion and neck supple.  Cardiovascular:     Rate and Rhythm: Normal rate and regular rhythm.     Heart sounds: No murmur. No friction rub.  Pulmonary:     Effort: Pulmonary effort is normal. No respiratory distress.     Breath sounds: Normal breath sounds. No wheezing or rales.  Abdominal:     General: Bowel sounds are normal. There is no distension.     Palpations: Abdomen is soft.     Tenderness: There is no abdominal tenderness. There is no guarding or rebound.     Comments: There is tenderness to palpation in the right lower quadrant/right flank.  Musculoskeletal: Normal range of motion.  Skin:    General: Skin is warm and dry.  Neurological:     Mental Status: He is alert and oriented to person, place, and time.     Coordination: Coordination normal.      ED Treatments / Results  Labs (all labs ordered are listed, but only  abnormal results are displayed) Labs Reviewed  URINALYSIS, ROUTINE W REFLEX MICROSCOPIC - Abnormal; Notable for the following components:      Result Value   Glucose, UA >=500 (*)    Protein, ur 30 (*)    Bacteria, UA RARE (*)    All other components within normal limits  COMPREHENSIVE METABOLIC PANEL  CBC WITH DIFFERENTIAL/PLATELET    EKG None  Radiology No results found.  Procedures Procedures (including critical care time)  Medications Ordered in ED Medications  sodium chloride 0.9 % bolus 1,000 mL (has no administration in  time range)     Initial Impression / Assessment and Plan / ED Course  I have reviewed the triage vital signs and the nursing notes.  Pertinent labs & imaging results that were available during my care of the patient were reviewed by me and considered in my medical decision making (see chart for details).  Patient presents with right lower quadrant and right flank pain.  His work-up is unremarkable.  He has no white count or fever and CT scan shows no evidence for appendicitis, kidney stone, or other intra-abdominal process.  The patient's pain is worse when he ambulates and lift his his leg and goes away completely when he lies still.  I highly suspect a musculoskeletal etiology.  He will be treated with anti-inflammatories, tramadol, and follow-up with primary doctor if not improving.  Final Clinical Impressions(s) / ED Diagnoses   Final diagnoses:  None    ED Discharge Orders    None       Geoffery Lyonselo, Vanessa Kampf, MD 02/01/18 2041

## 2018-02-01 NOTE — ED Notes (Signed)
Pt alert and oriented in NAD. Pt and wife verbalized understanding of discharge instructions  

## 2018-02-01 NOTE — Discharge Instructions (Addendum)
Motrin as prescribed.  Tramadol as prescribed as needed for pain not relieved with Motrin.  Follow-up with your primary doctor if symptoms or not improving in the next 3 to 4 days, and return to the ER if you develop worsening pain, high fever, or other new and concerning symptoms.

## 2018-02-01 NOTE — ED Triage Notes (Signed)
Pt reports R flank pain radiating to R side. Pt reports being told he had back spasms x 1 month ago. Pt reports it went away and came back x 1 week, worsening today. Pt reports some nausea today, no vomiting, denies changes in urination.

## 2018-02-07 ENCOUNTER — Encounter: Payer: Self-pay | Admitting: Podiatry

## 2018-02-07 NOTE — Progress Notes (Signed)
Subjective: Thomas Joseph is a 85 y.o. y.o. male who presents for preventative foot care today with diabetes and PAD and cc of painful, discolored, thick toenails and painful callus/corn which interfere with daily activities. Pain is aggravated when wearing enclosed shoe gear. Pain is relieved with periodic professional debridement.  Mila Palmer, MD is his PCP.   Current Outpatient Medications:  .  acetaminophen (TYLENOL) 325 MG tablet, Take 325-650 mg by mouth every 12 (twelve) hours as needed for mild pain or headache., Disp: , Rfl:  .  amLODipine (NORVASC) 10 MG tablet, Take 10 mg by mouth daily., Disp: , Rfl:  .  aspirin 81 MG chewable tablet, Chew 81 mg by mouth every other day. , Disp: , Rfl:  .  benazepril (LOTENSIN) 40 MG tablet, Take 40 mg by mouth daily., Disp: , Rfl:  .  cetirizine (ZYRTEC) 10 MG tablet, Take 10 mg by mouth daily as needed for allergies., Disp: , Rfl:  .  COENZYME Q-10 PO, Take 1 capsule by mouth daily., Disp: , Rfl:  .  docusate sodium (COLACE) 100 MG capsule, Take 1 capsule (100 mg total) by mouth 2 (two) times daily., Disp: 30 capsule, Rfl: 0 .  lisinopril-hydrochlorothiazide (PRINZIDE,ZESTORETIC) 20-12.5 MG tablet, Take 1 tablet by mouth daily., Disp: , Rfl: 4 .  meclizine (ANTIVERT) 12.5 MG tablet, Take 1 tablet (12.5 mg total) by mouth 3 (three) times daily as needed for dizziness., Disp: 30 tablet, Rfl: 0 .  metFORMIN (GLUCOPHAGE-XR) 500 MG 24 hr tablet, Take 1,000 mg by mouth daily. , Disp: , Rfl:  .  oxyCODONE-acetaminophen (PERCOCET/ROXICET) 5-325 MG tablet, Take 1-2 tablets by mouth every 4 (four) hours as needed for moderate pain., Disp: 30 tablet, Rfl: 0 .  polyethylene glycol powder (GLYCOLAX/MIRALAX) powder, Take 17 g by mouth 3 (three) times daily. Until having diarrhea; then use daily as needed., Disp: 255 g, Rfl: 0 .  rosuvastatin (CRESTOR) 20 MG tablet, Take 20 mg by mouth every 14 (fourteen) days. , Disp: , Rfl:  .  tadalafil (CIALIS) 5 MG  tablet, Take 5 mg by mouth daily as needed for erectile dysfunction., Disp: , Rfl:  .  zolpidem (AMBIEN) 10 MG tablet, Take 10 mg by mouth at bedtime as needed for sleep. , Disp: , Rfl:  .  ibuprofen (ADVIL,MOTRIN) 600 MG tablet, Take 1 tablet (600 mg total) by mouth 3 (three) times daily., Disp: 12 tablet, Rfl: 0 .  traMADol (ULTRAM) 50 MG tablet, Take 1 tablet (50 mg total) by mouth every 6 (six) hours as needed., Disp: 15 tablet, Rfl: 0  Allergies  Allergen Reactions  . Hydrochlorothiazide Other (See Comments)    Reaction not recalled, believes it just made him urinate very frequently  . Lipitor [Atorvastatin] Other (See Comments)    Muscle pain  . Viagra [Sildenafil Citrate] Palpitations    Objective: Vascular Examination: Capillary refill time <4 seconds x 10 digits  Dorsalis pedis pulses 1/4 b/l  Posterior tibial pulses absent b/l  No digital hair x 10 digits  Skin temperature warm to cool b/l  +Varicosities b/l LE  Edema +1 BLE  Dermatological Examination: Skin warm, dry and supple b/l  Toenails 1, 3-5 b/l discolored, thick, dystrophic with subungual debris and pain with palpation to nailbeds due to thickness of nails.  Anonychia 2nd digits b/l secondary to permanent total nail avulsions. Nail beds in tact b/l.  Musculoskeletal: Muscle strength 5/5 to all LE muscle groups  Pes planus b/l  Neurological: Sensation intact with  10 gram monofilament.  Vibratory sensation diminished  Assessment: 1. Painful onychomycosis toenails 1-5 b/l 2. NIDDM with PAD   Plan: 1. Discuss diabetic foot care principles. Literature dispensed. 2. Toenails 1, 3-5 b/l were debrided in length and girth without iatrogenic bleeding. 3. Patient to continue soft, supportive shoe gear 4. Patient to report any pedal injuries to medical professional  5. Follow up 3 months.  6. Patient/POA to call should there be a concern in the interim.

## 2018-04-18 ENCOUNTER — Ambulatory Visit: Payer: Medicare Other | Admitting: Podiatry

## 2018-06-09 ENCOUNTER — Other Ambulatory Visit: Payer: Self-pay

## 2018-06-09 ENCOUNTER — Encounter: Payer: Self-pay | Admitting: Podiatry

## 2018-06-09 ENCOUNTER — Ambulatory Visit: Payer: Medicare Other | Admitting: Podiatry

## 2018-06-09 DIAGNOSIS — E119 Type 2 diabetes mellitus without complications: Secondary | ICD-10-CM

## 2018-06-09 DIAGNOSIS — M79675 Pain in left toe(s): Secondary | ICD-10-CM

## 2018-06-09 DIAGNOSIS — E1151 Type 2 diabetes mellitus with diabetic peripheral angiopathy without gangrene: Secondary | ICD-10-CM | POA: Diagnosis not present

## 2018-06-09 DIAGNOSIS — B351 Tinea unguium: Secondary | ICD-10-CM | POA: Diagnosis not present

## 2018-06-09 DIAGNOSIS — M79674 Pain in right toe(s): Secondary | ICD-10-CM | POA: Diagnosis not present

## 2018-06-09 NOTE — Patient Instructions (Signed)
Diabetes Mellitus and Foot Care Foot care is an important part of your health, especially when you have diabetes. Diabetes may cause you to have problems because of poor blood flow (circulation) to your feet and legs, which can cause your skin to:  Become thinner and drier.  Break more easily.  Heal more slowly.  Peel and crack. You may also have nerve damage (neuropathy) in your legs and feet, causing decreased feeling in them. This means that you may not notice minor injuries to your feet that could lead to more serious problems. Noticing and addressing any potential problems early is the best way to prevent future foot problems. How to care for your feet Foot hygiene  Wash your feet daily with warm water and mild soap. Do not use hot water. Then, pat your feet and the areas between your toes until they are completely dry. Do not soak your feet as this can dry your skin.  Trim your toenails straight across. Do not dig under them or around the cuticle. File the edges of your nails with an emery board or nail file.  Apply a moisturizing lotion or petroleum jelly to the skin on your feet and to dry, brittle toenails. Use lotion that does not contain alcohol and is unscented. Do not apply lotion between your toes. Shoes and socks  Wear clean socks or stockings every day. Make sure they are not too tight. Do not wear knee-high stockings since they may decrease blood flow to your legs.  Wear shoes that fit properly and have enough cushioning. Always look in your shoes before you put them on to be sure there are no objects inside.  To break in new shoes, wear them for just a few hours a day. This prevents injuries on your feet. Wounds, scrapes, corns, and calluses  Check your feet daily for blisters, cuts, bruises, sores, and redness. If you cannot see the bottom of your feet, use a mirror or ask someone for help.  Do not cut corns or calluses or try to remove them with medicine.  If you  find a minor scrape, cut, or break in the skin on your feet, keep it and the skin around it clean and dry. You may clean these areas with mild soap and water. Do not clean the area with peroxide, alcohol, or iodine.  If you have a wound, scrape, corn, or callus on your foot, look at it several times a day to make sure it is healing and not infected. Check for: ? Redness, swelling, or pain. ? Fluid or blood. ? Warmth. ? Pus or a bad smell. General instructions  Do not cross your legs. This may decrease blood flow to your feet.  Do not use heating pads or hot water bottles on your feet. They may burn your skin. If you have lost feeling in your feet or legs, you may not know this is happening until it is too late.  Protect your feet from hot and cold by wearing shoes, such as at the beach or on hot pavement.  Schedule a complete foot exam at least once a year (annually) or more often if you have foot problems. If you have foot problems, report any cuts, sores, or bruises to your health care provider immediately. Contact a health care provider if:  You have a medical condition that increases your risk of infection and you have any cuts, sores, or bruises on your feet.  You have an injury that is not   healing.  You have redness on your legs or feet.  You feel burning or tingling in your legs or feet.  You have pain or cramps in your legs and feet.  Your legs or feet are numb.  Your feet always feel cold.  You have pain around a toenail. Get help right away if:  You have a wound, scrape, corn, or callus on your foot and: ? You have pain, swelling, or redness that gets worse. ? You have fluid or blood coming from the wound, scrape, corn, or callus. ? Your wound, scrape, corn, or callus feels warm to the touch. ? You have pus or a bad smell coming from the wound, scrape, corn, or callus. ? You have a fever. ? You have a red line going up your leg. Summary  Check your feet every day  for cuts, sores, red spots, swelling, and blisters.  Moisturize feet and legs daily.  Wear shoes that fit properly and have enough cushioning.  If you have foot problems, report any cuts, sores, or bruises to your health care provider immediately.  Schedule a complete foot exam at least once a year (annually) or more often if you have foot problems. This information is not intended to replace advice given to you by your health care provider. Make sure you discuss any questions you have with your health care provider. Document Released: 12/26/1999 Document Revised: 02/09/2017 Document Reviewed: 01/30/2016 Elsevier Interactive Patient Education  2019 [employer]  Onychomycosis/Fungal Toenails  WHAT IS IT? An infection that lies within the keratin of your nail plate that is caused by a fungus.  WHY ME? Fungal infections affect all ages, sexes, races, and creeds.  There may be many factors that predispose you to a fungal infection such as age, coexisting medical conditions such as diabetes, or an autoimmune disease; stress, medications, fatigue, genetics, etc.  Bottom line: fungus thrives in a warm, moist environment and your shoes offer such a location.  IS IT CONTAGIOUS? Theoretically, yes.  You do not want to share shoes, nail clippers or files with someone who has fungal toenails.  Walking around barefoot in the same room or sleeping in the same bed is unlikely to transfer the organism.  It is important to realize, however, that fungus can spread easily from one nail to the next on the same foot.  HOW DO WE TREAT THIS?  There are several ways to treat this condition.  Treatment may depend on many factors such as age, medications, pregnancy, liver and kidney conditions, etc.  It is best to ask your doctor which options are available to you.  1. No treatment.   Unlike many other medical concerns, you can live with this condition.  However for many people this can be a painful condition and  may lead to ingrown toenails or a bacterial infection.  It is recommended that you keep the nails cut short to help reduce the amount of fungal nail. 2. Topical treatment.  These range from herbal remedies to prescription strength nail lacquers.  About 40-50% effective, topicals require twice daily application for approximately 9 to 12 months or until an entirely new nail has grown out.  The most effective topicals are medical grade medications available through physicians offices. 3. Oral antifungal medications.  With an 80-90% cure rate, the most common oral medication requires 3 to 4 months of therapy and stays in your system for a year as the new nail grows out.  Oral antifungal medications do require   blood work to make sure it is a safe drug for you.  A liver function panel will be performed prior to starting the medication and after the first month of treatment.  It is important to have the blood work performed to avoid any harmful side effects.  In general, this medication safe but blood work is required. 4. Laser Therapy.  This treatment is performed by applying a specialized laser to the affected nail plate.  This therapy is noninvasive, fast, and non-painful.  It is not covered by insurance and is therefore, out of pocket.  The results have been very good with a 80-95% cure rate.  The [facility] is the only practice in the area to offer this therapy. 5. Permanent Nail Avulsion.  Removing the entire nail so that a new nail will not grow back. 

## 2018-06-11 NOTE — Progress Notes (Signed)
Subjective: Thomas Joseph is a 85 y.o. y.o. male who presents for preventative diabetic foot care today with h/o PAD.  He is followed for painful, discolored, thick toenails which interfere with daily activities. Pain is aggravated when wearing enclosed shoe gear. Pain is relieved with periodic professional debridement.  Thomas Joseph, Sharon, MD is his PCP.    Current Outpatient Medications:  .  acetaminophen (TYLENOL) 325 MG tablet, Take 325-650 mg by mouth every 12 (twelve) hours as needed for mild pain or headache., Disp: , Rfl:  .  amLODipine (NORVASC) 10 MG tablet, Take 10 mg by mouth daily., Disp: , Rfl:  .  aspirin 81 MG chewable tablet, Chew 81 mg by mouth every other day. , Disp: , Rfl:  .  benazepril (LOTENSIN) 40 MG tablet, Take 40 mg by mouth daily., Disp: , Rfl:  .  cetirizine (ZYRTEC) 10 MG tablet, Take 10 mg by mouth daily as needed for allergies., Disp: , Rfl:  .  COENZYME Q-10 PO, Take 1 capsule by mouth daily., Disp: , Rfl:  .  docusate sodium (COLACE) 100 MG capsule, Take 1 capsule (100 mg total) by mouth 2 (two) times daily., Disp: 30 capsule, Rfl: 0 .  ibuprofen (ADVIL,MOTRIN) 600 MG tablet, Take 1 tablet (600 mg total) by mouth 3 (three) times daily., Disp: 12 tablet, Rfl: 0 .  lisinopril-hydrochlorothiazide (PRINZIDE,ZESTORETIC) 20-12.5 MG tablet, Take 1 tablet by mouth daily., Disp: , Rfl: 4 .  meclizine (ANTIVERT) 12.5 MG tablet, Take 1 tablet (12.5 mg total) by mouth 3 (three) times daily as needed for dizziness., Disp: 30 tablet, Rfl: 0 .  meloxicam (MOBIC) 15 MG tablet, Take 15 mg by mouth daily as needed., Disp: , Rfl:  .  metFORMIN (GLUCOPHAGE-XR) 500 MG 24 hr tablet, Take 1,000 mg by mouth daily. , Disp: , Rfl:  .  oxyCODONE-acetaminophen (PERCOCET/ROXICET) 5-325 MG tablet, Take 1-2 tablets by mouth every 4 (four) hours as needed for moderate pain., Disp: 30 tablet, Rfl: 0 .  polyethylene glycol powder (GLYCOLAX/MIRALAX) powder, Take 17 g by mouth 3 (three) times  daily. Until having diarrhea; then use daily as needed., Disp: 255 g, Rfl: 0 .  rosuvastatin (CRESTOR) 20 MG tablet, Take 20 mg by mouth every 14 (fourteen) days. , Disp: , Rfl:  .  tadalafil (CIALIS) 5 MG tablet, Take 5 mg by mouth daily as needed for erectile dysfunction., Disp: , Rfl:  .  tiZANidine (ZANAFLEX) 4 MG tablet, TAKE 1 TABLET BY MOUTH THREE TIMES A DAY AS NEEDED FOR 10 DAYS, Disp: , Rfl:  .  traMADol (ULTRAM) 50 MG tablet, Take 1 tablet (50 mg total) by mouth every 6 (six) hours as needed., Disp: 15 tablet, Rfl: 0 .  zolpidem (AMBIEN) 10 MG tablet, Take 10 mg by mouth at bedtime as needed for sleep. , Disp: , Rfl:   Allergies  Allergen Reactions  . Hydrochlorothiazide Other (See Comments)    Reaction not recalled, believes it just made him urinate very frequently  . Lipitor [Atorvastatin] Other (See Comments)    Muscle pain  . Viagra [Sildenafil Citrate] Palpitations    Objective: There were no vitals filed for this visit.  Vascular Examination: Capillary refill time <3 seconds x 10 digits  Dorsalis pedis pulses 1/4 b/l.  Posterior tibial pulses absent b/l.  No digital hair x 10 digits  Skin temperature warm to cool b/l  Dermatological Examination: Skin thin, shiny and atrophic b/l.  Toenails 1, 3-5 b/l  discolored, thick, dystrophic with subungual debris  and pain with palpation to nailbeds due to thickness of nails.  Anonychia b/l 2nd toe(s) with evidence of permanent total nail avulsion. Nailbed(s) completely epithelialized and intact.  Musculoskeletal: Muscle strength 5/5 to all LE muscle groups.  Pes planus b/l LE.  Neurological: Sensation intact with 10 gram monofilament.  Vibratory sensation diminished b/l.  Assessment: 1. Painful onychomycosis toenails 1 b/l and 3-5 b/l 2. NIDDM with Peripheral arterial disease 3. Pes planus b/l 4. Encounter for diabetic foot exam  Plan: 1. Discuss diabetic foot care principles. Literature  dispensed. 2. Diabetic foot exam performed today. 3. Toenails 1-5 b/l were debrided in length and girth without iatrogenic bleeding. 4. Patient to continue soft, supportive shoe gear daily. 5. Patient to report any pedal injuries to medical professional immediately. 6. Follow up 3 months.  7. Patient/POA to call should there be a concern in the interim.

## 2018-09-08 ENCOUNTER — Encounter: Payer: Self-pay | Admitting: Podiatry

## 2018-09-08 ENCOUNTER — Ambulatory Visit (INDEPENDENT_AMBULATORY_CARE_PROVIDER_SITE_OTHER): Payer: Medicare Other | Admitting: Podiatry

## 2018-09-08 ENCOUNTER — Other Ambulatory Visit: Payer: Self-pay

## 2018-09-08 DIAGNOSIS — E1151 Type 2 diabetes mellitus with diabetic peripheral angiopathy without gangrene: Secondary | ICD-10-CM | POA: Diagnosis not present

## 2018-09-08 DIAGNOSIS — M79675 Pain in left toe(s): Secondary | ICD-10-CM | POA: Diagnosis not present

## 2018-09-08 DIAGNOSIS — B351 Tinea unguium: Secondary | ICD-10-CM | POA: Diagnosis not present

## 2018-09-08 DIAGNOSIS — M79674 Pain in right toe(s): Secondary | ICD-10-CM | POA: Diagnosis not present

## 2018-09-08 NOTE — Patient Instructions (Signed)

## 2018-09-17 NOTE — Progress Notes (Signed)
Subjective:  Thomas Joseph presents to clinic today with cc of  painful, thick, discolored, elongated toenails b/l that become tender and cannot cut because of thickness. Pain is aggravated when wearing enclosed shoe gear.    Current Outpatient Medications:  .  acetaminophen (TYLENOL) 325 MG tablet, Take 325-650 mg by mouth every 12 (twelve) hours as needed for mild pain or headache., Disp: , Rfl:  .  amLODipine (NORVASC) 10 MG tablet, Take 10 mg by mouth daily., Disp: , Rfl:  .  aspirin 81 MG chewable tablet, Chew 81 mg by mouth every other day. , Disp: , Rfl:  .  benazepril (LOTENSIN) 40 MG tablet, Take 40 mg by mouth daily., Disp: , Rfl:  .  cetirizine (ZYRTEC) 10 MG tablet, Take 10 mg by mouth daily as needed for allergies., Disp: , Rfl:  .  COENZYME Q-10 PO, Take 1 capsule by mouth daily., Disp: , Rfl:  .  docusate sodium (COLACE) 100 MG capsule, Take 1 capsule (100 mg total) by mouth 2 (two) times daily., Disp: 30 capsule, Rfl: 0 .  ibuprofen (ADVIL,MOTRIN) 600 MG tablet, Take 1 tablet (600 mg total) by mouth 3 (three) times daily., Disp: 12 tablet, Rfl: 0 .  lisinopril-hydrochlorothiazide (PRINZIDE,ZESTORETIC) 20-12.5 MG tablet, Take 1 tablet by mouth daily., Disp: , Rfl: 4 .  meclizine (ANTIVERT) 12.5 MG tablet, Take 1 tablet (12.5 mg total) by mouth 3 (three) times daily as needed for dizziness., Disp: 30 tablet, Rfl: 0 .  meloxicam (MOBIC) 15 MG tablet, Take 15 mg by mouth daily as needed., Disp: , Rfl:  .  metFORMIN (GLUCOPHAGE-XR) 500 MG 24 hr tablet, Take 1,000 mg by mouth daily. , Disp: , Rfl:  .  oxyCODONE-acetaminophen (PERCOCET/ROXICET) 5-325 MG tablet, Take 1-2 tablets by mouth every 4 (four) hours as needed for moderate pain., Disp: 30 tablet, Rfl: 0 .  polyethylene glycol powder (GLYCOLAX/MIRALAX) powder, Take 17 g by mouth 3 (three) times daily. Until having diarrhea; then use daily as needed., Disp: 255 g, Rfl: 0 .  rosuvastatin (CRESTOR) 20 MG tablet, Take 20 mg by  mouth every 14 (fourteen) days. , Disp: , Rfl:  .  tadalafil (CIALIS) 20 MG tablet, Take 20 mg by mouth as needed., Disp: , Rfl:  .  tadalafil (CIALIS) 5 MG tablet, Take 5 mg by mouth daily as needed for erectile dysfunction., Disp: , Rfl:  .  tiZANidine (ZANAFLEX) 4 MG tablet, TAKE 1 TABLET BY MOUTH THREE TIMES A DAY AS NEEDED FOR 10 DAYS, Disp: , Rfl:  .  traMADol (ULTRAM) 50 MG tablet, Take 1 tablet (50 mg total) by mouth every 6 (six) hours as needed., Disp: 15 tablet, Rfl: 0 .  zolpidem (AMBIEN) 10 MG tablet, Take 10 mg by mouth at bedtime as needed for sleep. , Disp: , Rfl:    Allergies  Allergen Reactions  . Hydrochlorothiazide Other (See Comments)    Reaction not recalled, believes it just made him urinate very frequently  . Lipitor [Atorvastatin] Other (See Comments)    Muscle pain  . Viagra [Sildenafil Citrate] Palpitations     Objective: Physical Examination:  Vascular Examination: Capillary refill time <3 seconds x 8 digits.  DP pulses 1/4 b/l.  PT pulses nonpalpable b/l.  Digital hair absent b/l.   No edema noted b/l.  Skin temperature gradient warm to cool b/l.  Dermatological Examination: Skin thin, shiny and atrophic b/l.  No open wounds b/l.  No interdigital macerations noted b/l.  Anonychia b/l 2nd toe(s)  with evidence of permanent total nail avulsion. Nailbed(s) completely epithelialized and intact.  Elongated, thick, discolored brittle toenails with subungual debris and pain on dorsal palpation of nailbeds 1 b/l and 3-5 b/l.  Musculoskeletal Examination: Muscle strength 5/5 b/l to all muscle groups b/l.  Pes planus b/l LE.  No pain, crepitus or joint discomfort with active/passive ROM.  Neurological Examination: Sensation intact 5/5 b/l with 10 gram monofilament.  Vibratory sensation diminished b/l.  Assessment: Mycotic nail infection with pain 1 b/l and 3-5 b/l NIDDM with PAD  Plan: 1. Toenails 1 b/l and 3-5 b/l were debrided in length  and girth without iatrogenic laceration. 2.  Continue soft, supportive shoe gear daily. 3.  Report any pedal injuries to medical professional. 4.  Follow up 3 months. 5.  Patient/POA to call should there be a question/concern in there interim.

## 2018-12-22 ENCOUNTER — Ambulatory Visit: Payer: Medicare Other | Admitting: Podiatry

## 2019-01-24 ENCOUNTER — Ambulatory Visit: Payer: Medicare Other | Attending: Internal Medicine

## 2019-01-24 DIAGNOSIS — Z23 Encounter for immunization: Secondary | ICD-10-CM | POA: Insufficient documentation

## 2019-01-24 NOTE — Progress Notes (Signed)
   Covid-19 Vaccination Clinic  Name:  Thomas Joseph    MRN: [id number] DOB: October 10, 1933  01/24/2019  Mr. Thomas Joseph was observed post Covid-19 immunization for 15 minutes without incidence. He was provided with Vaccine Information Sheet and instruction to access the V-Safe system.   Mr. Thomas Joseph was instructed to call 911 with any severe reactions post vaccine: Marland Kitchen Difficulty breathing  . Swelling of your face and throat  . A fast heartbeat  . A bad rash all over your body  . Dizziness and weakness    Immunizations Administered    Name Date Dose VIS Date Route   Pfizer COVID-19 Vaccine 01/24/2019  9:56 AM 0.3 mL 12/22/2018 Intramuscular   Manufacturer: [employer], [employer]   Lot: [id number]   NDC: [address]-2

## 2019-02-02 ENCOUNTER — Ambulatory Visit (INDEPENDENT_AMBULATORY_CARE_PROVIDER_SITE_OTHER)
Admission: EM | Admit: 2019-02-02 | Discharge: 2019-02-02 | Disposition: A | Discharge status: Home / Self Care | Payer: Medicare Other | Source: Home / Self Care

## 2019-02-02 ENCOUNTER — Other Ambulatory Visit: Payer: Self-pay

## 2019-02-02 ENCOUNTER — Encounter (HOSPITAL_COMMUNITY): Payer: Self-pay | Admitting: Emergency Medicine

## 2019-02-02 ENCOUNTER — Emergency Department (HOSPITAL_COMMUNITY): Payer: Medicare Other

## 2019-02-02 ENCOUNTER — Inpatient Hospital Stay (HOSPITAL_COMMUNITY)
Admission: EM | Admit: 2019-02-02 | Discharge: 2019-02-08 | DRG: 177 | Disposition: A | Discharge status: Home / Self Care | Payer: Medicare Other | Attending: Internal Medicine | Admitting: Internal Medicine

## 2019-02-02 DIAGNOSIS — Z7982 Long term (current) use of aspirin: Secondary | ICD-10-CM

## 2019-02-02 DIAGNOSIS — Z806 Family history of leukemia: Secondary | ICD-10-CM

## 2019-02-02 DIAGNOSIS — Z888 Allergy status to other drugs, medicaments and biological substances status: Secondary | ICD-10-CM | POA: Diagnosis not present

## 2019-02-02 DIAGNOSIS — J9601 Acute respiratory failure with hypoxia: Secondary | ICD-10-CM

## 2019-02-02 DIAGNOSIS — Z8249 Family history of ischemic heart disease and other diseases of the circulatory system: Secondary | ICD-10-CM | POA: Diagnosis not present

## 2019-02-02 DIAGNOSIS — Z791 Long term (current) use of non-steroidal anti-inflammatories (NSAID): Secondary | ICD-10-CM

## 2019-02-02 DIAGNOSIS — I129 Hypertensive chronic kidney disease with stage 1 through stage 4 chronic kidney disease, or unspecified chronic kidney disease: Secondary | ICD-10-CM | POA: Diagnosis present

## 2019-02-02 DIAGNOSIS — N1831 Chronic kidney disease, stage 3a: Secondary | ICD-10-CM | POA: Diagnosis present

## 2019-02-02 DIAGNOSIS — N400 Enlarged prostate without lower urinary tract symptoms: Secondary | ICD-10-CM | POA: Diagnosis present

## 2019-02-02 DIAGNOSIS — I10 Essential (primary) hypertension: Secondary | ICD-10-CM | POA: Diagnosis present

## 2019-02-02 DIAGNOSIS — D631 Anemia in chronic kidney disease: Secondary | ICD-10-CM | POA: Diagnosis present

## 2019-02-02 DIAGNOSIS — Z20822 Contact with and (suspected) exposure to covid-19: Secondary | ICD-10-CM

## 2019-02-02 DIAGNOSIS — Z79899 Other long term (current) drug therapy: Secondary | ICD-10-CM

## 2019-02-02 DIAGNOSIS — N179 Acute kidney failure, unspecified: Secondary | ICD-10-CM | POA: Diagnosis present

## 2019-02-02 DIAGNOSIS — Z7984 Long term (current) use of oral hypoglycemic drugs: Secondary | ICD-10-CM

## 2019-02-02 DIAGNOSIS — Z9079 Acquired absence of other genital organ(s): Secondary | ICD-10-CM

## 2019-02-02 DIAGNOSIS — J1282 Pneumonia due to coronavirus disease 2019: Secondary | ICD-10-CM | POA: Diagnosis present

## 2019-02-02 DIAGNOSIS — Z823 Family history of stroke: Secondary | ICD-10-CM | POA: Diagnosis not present

## 2019-02-02 DIAGNOSIS — T502X5A Adverse effect of carbonic-anhydrase inhibitors, benzothiadiazides and other diuretics, initial encounter: Secondary | ICD-10-CM | POA: Diagnosis not present

## 2019-02-02 DIAGNOSIS — E860 Dehydration: Secondary | ICD-10-CM | POA: Diagnosis present

## 2019-02-02 DIAGNOSIS — Z87891 Personal history of nicotine dependence: Secondary | ICD-10-CM

## 2019-02-02 DIAGNOSIS — E118 Type 2 diabetes mellitus with unspecified complications: Secondary | ICD-10-CM | POA: Diagnosis not present

## 2019-02-02 DIAGNOSIS — Y92239 Unspecified place in hospital as the place of occurrence of the external cause: Secondary | ICD-10-CM | POA: Diagnosis not present

## 2019-02-02 DIAGNOSIS — E1122 Type 2 diabetes mellitus with diabetic chronic kidney disease: Secondary | ICD-10-CM | POA: Diagnosis present

## 2019-02-02 DIAGNOSIS — U071 COVID-19: Secondary | ICD-10-CM | POA: Diagnosis present

## 2019-02-02 DIAGNOSIS — Z8349 Family history of other endocrine, nutritional and metabolic diseases: Secondary | ICD-10-CM

## 2019-02-02 DIAGNOSIS — E876 Hypokalemia: Secondary | ICD-10-CM | POA: Diagnosis present

## 2019-02-02 LAB — LACTIC ACID, PLASMA
Lactic Acid, Venous: 1.6 mmol/L (ref 0.5–1.9)
Lactic Acid, Venous: 1.6 mmol/L (ref 0.5–1.9)

## 2019-02-02 LAB — COMPREHENSIVE METABOLIC PANEL
ALT: 17 U/L (ref 0–44)
AST: 36 U/L (ref 15–41)
Albumin: 3.1 g/dL — ABNORMAL LOW (ref 3.5–5.0)
Alkaline Phosphatase: 56 U/L (ref 38–126)
Anion gap: 12 (ref 5–15)
BUN: 26 mg/dL — ABNORMAL HIGH (ref 8–23)
CO2: 26 mmol/L (ref 22–32)
Calcium: 8.5 mg/dL — ABNORMAL LOW (ref 8.9–10.3)
Chloride: 102 mmol/L (ref 98–111)
Creatinine, Ser: 1.47 mg/dL — ABNORMAL HIGH (ref 0.61–1.24)
GFR calc Af Amer: 50 mL/min — ABNORMAL LOW (ref >60)
GFR calc non Af Amer: 43 mL/min — ABNORMAL LOW (ref >60)
Glucose, Bld: 133 mg/dL — ABNORMAL HIGH (ref 70–99)
Potassium: 3.4 mmol/L — ABNORMAL LOW (ref 3.5–5.1)
Sodium: 140 mmol/L (ref 135–145)
Total Bilirubin: 0.7 mg/dL (ref 0.3–1.2)
Total Protein: 7.5 g/dL (ref 6.5–8.1)

## 2019-02-02 LAB — CREATININE, SERUM
Creatinine, Ser: 1.41 mg/dL — ABNORMAL HIGH (ref 0.61–1.24)
GFR calc Af Amer: 53 mL/min — ABNORMAL LOW (ref >60)
GFR calc non Af Amer: 46 mL/min — ABNORMAL LOW (ref >60)

## 2019-02-02 LAB — C-REACTIVE PROTEIN: CRP: 22.1 mg/dL — ABNORMAL HIGH (ref <1.0)

## 2019-02-02 LAB — FERRITIN: Ferritin: 428 ng/mL — ABNORMAL HIGH (ref 24–336)

## 2019-02-02 LAB — CBC WITH DIFFERENTIAL/PLATELET
Abs Immature Granulocytes: 0.02 10*3/uL (ref 0.00–0.07)
Basophils Absolute: 0 10*3/uL (ref 0.0–0.1)
Basophils Relative: 0 %
Eosinophils Absolute: 0 10*3/uL (ref 0.0–0.5)
Eosinophils Relative: 0 %
HCT: 38.3 % — ABNORMAL LOW (ref 39.0–52.0)
Hemoglobin: 13.1 g/dL (ref 13.0–17.0)
Immature Granulocytes: 0 %
Lymphocytes Relative: 13 %
Lymphs Abs: 0.7 10*3/uL (ref 0.7–4.0)
MCH: 31 pg (ref 26.0–34.0)
MCHC: 34.2 g/dL (ref 30.0–36.0)
MCV: 90.8 fL (ref 80.0–100.0)
Monocytes Absolute: 0.5 10*3/uL (ref 0.1–1.0)
Monocytes Relative: 9 %
Neutro Abs: 4.2 10*3/uL (ref 1.7–7.7)
Neutrophils Relative %: 78 %
Platelets: 206 10*3/uL (ref 150–400)
RBC: 4.22 MIL/uL (ref 4.22–5.81)
RDW: 12.9 % (ref 11.5–15.5)
WBC: 5.4 10*3/uL (ref 4.0–10.5)
nRBC: 0 % (ref 0.0–0.2)

## 2019-02-02 LAB — FIBRINOGEN: Fibrinogen: 728 mg/dL — ABNORMAL HIGH (ref 210–475)

## 2019-02-02 LAB — HEMOGLOBIN A1C
Hgb A1c MFr Bld: 7 % — ABNORMAL HIGH (ref 4.8–5.6)
Mean Plasma Glucose: 154.2 mg/dL

## 2019-02-02 LAB — CBC
HCT: 41 % (ref 39.0–52.0)
Hemoglobin: 13.7 g/dL (ref 13.0–17.0)
MCH: 30.9 pg (ref 26.0–34.0)
MCHC: 33.4 g/dL (ref 30.0–36.0)
MCV: 92.3 fL (ref 80.0–100.0)
Platelets: 213 10*3/uL (ref 150–400)
RBC: 4.44 MIL/uL (ref 4.22–5.81)
RDW: 12.9 % (ref 11.5–15.5)
WBC: 5.1 10*3/uL (ref 4.0–10.5)
nRBC: 0 % (ref 0.0–0.2)

## 2019-02-02 LAB — PROCALCITONIN: Procalcitonin: 0.16 ng/mL

## 2019-02-02 LAB — D-DIMER, QUANTITATIVE: D-Dimer, Quant: 1.3 ug/mL-FEU — ABNORMAL HIGH (ref 0.00–0.50)

## 2019-02-02 LAB — GLUCOSE, CAPILLARY: Glucose-Capillary: 127 mg/dL — ABNORMAL HIGH (ref 70–99)

## 2019-02-02 LAB — LACTATE DEHYDROGENASE: LDH: 243 U/L — ABNORMAL HIGH (ref 98–192)

## 2019-02-02 LAB — ABO/RH: ABO/RH(D): A POS

## 2019-02-02 LAB — TRIGLYCERIDES: Triglycerides: 107 mg/dL (ref <150)

## 2019-02-02 LAB — POC SARS CORONAVIRUS 2 AG -  ED: SARS Coronavirus 2 Ag: NEGATIVE

## 2019-02-02 MED ORDER — SENNOSIDES-DOCUSATE SODIUM 8.6-50 MG PO TABS: 1.0000 | ORAL_TABLET | Freq: Every evening | ORAL | Status: DC | PRN

## 2019-02-02 MED ORDER — ACETAMINOPHEN 325 MG PO TABS: 650.0000 mg | ORAL_TABLET | Freq: Four times a day (QID) | ORAL | Status: DC | PRN

## 2019-02-02 MED ORDER — IPRATROPIUM-ALBUTEROL 20-100 MCG/ACT IN AERS
1.0000 | INHALATION_SPRAY | Freq: Four times a day (QID) | RESPIRATORY_TRACT | Status: DC
  Administered 2019-02-02 – 2019-02-05 (×11): 1 via RESPIRATORY_TRACT
  Filled 2019-02-02: qty 4

## 2019-02-02 MED ORDER — ASPIRIN 81 MG PO CHEW
81.0000 mg | CHEWABLE_TABLET | ORAL | Status: DC
  Administered 2019-02-02 – 2019-02-08 (×4): 81 mg via ORAL
  Filled 2019-02-02 (×7): qty 1

## 2019-02-02 MED ORDER — AMLODIPINE BESYLATE 10 MG PO TABS
10.0000 mg | ORAL_TABLET | Freq: Every day | ORAL | Status: DC
  Administered 2019-02-03 – 2019-02-08 (×6): 10 mg via ORAL
  Filled 2019-02-02 (×6): qty 1

## 2019-02-02 MED ORDER — ACETAMINOPHEN 650 MG RE SUPP: 650.0000 mg | Freq: Four times a day (QID) | RECTAL | Status: DC | PRN

## 2019-02-02 MED ORDER — ZINC SULFATE 220 (50 ZN) MG PO CAPS
220.0000 mg | ORAL_CAPSULE | Freq: Every day | ORAL | Status: DC
  Administered 2019-02-02 – 2019-02-08 (×7): 220 mg via ORAL
  Filled 2019-02-02 (×7): qty 1

## 2019-02-02 MED ORDER — ONDANSETRON HCL 4 MG PO TABS: 4.0000 mg | ORAL_TABLET | Freq: Four times a day (QID) | ORAL | Status: DC | PRN

## 2019-02-02 MED ORDER — ONDANSETRON HCL 4 MG/2ML IJ SOLN: 4.0000 mg | Freq: Four times a day (QID) | INTRAMUSCULAR | Status: DC | PRN

## 2019-02-02 MED ORDER — SODIUM CHLORIDE 0.9 % IV SOLN: INTRAVENOUS | Status: AC

## 2019-02-02 MED ORDER — DEXAMETHASONE SODIUM PHOSPHATE 10 MG/ML IJ SOLN
6.0000 mg | INTRAMUSCULAR | Status: DC
  Administered 2019-02-02 – 2019-02-04 (×3): 6 mg via INTRAVENOUS
  Filled 2019-02-02 (×3): qty 1

## 2019-02-02 MED ORDER — ENOXAPARIN SODIUM 60 MG/0.6ML ~~LOC~~ SOLN
50.0000 mg | SUBCUTANEOUS | Status: DC
  Administered 2019-02-02: 50 mg via SUBCUTANEOUS
  Filled 2019-02-02: qty 0.6

## 2019-02-02 MED ORDER — INSULIN ASPART 100 UNIT/ML ~~LOC~~ SOLN
0.0000 [IU] | Freq: Three times a day (TID) | SUBCUTANEOUS | Status: DC
  Administered 2019-02-03 (×2): 3 [IU] via SUBCUTANEOUS
  Administered 2019-02-03 – 2019-02-04 (×2): 2 [IU] via SUBCUTANEOUS
  Administered 2019-02-04 – 2019-02-05 (×3): 3 [IU] via SUBCUTANEOUS
  Administered 2019-02-05: 5 [IU] via SUBCUTANEOUS

## 2019-02-02 MED ORDER — ASCORBIC ACID 500 MG PO TABS
500.0000 mg | ORAL_TABLET | Freq: Every day | ORAL | Status: DC
  Administered 2019-02-02 – 2019-02-08 (×7): 500 mg via ORAL
  Filled 2019-02-02 (×7): qty 1

## 2019-02-02 MED ORDER — INSULIN ASPART 100 UNIT/ML ~~LOC~~ SOLN
0.0000 [IU] | Freq: Every day | SUBCUTANEOUS | Status: DC
  Administered 2019-02-03: 2 [IU] via SUBCUTANEOUS

## 2019-02-02 MED ORDER — GUAIFENESIN-DM 100-10 MG/5ML PO SYRP
10.0000 mL | ORAL_SOLUTION | ORAL | Status: DC | PRN
  Administered 2019-02-05: 10 mL via ORAL
  Filled 2019-02-02: qty 10

## 2019-02-02 MED ORDER — POTASSIUM CHLORIDE CRYS ER 20 MEQ PO TBCR
40.0000 meq | EXTENDED_RELEASE_TABLET | Freq: Once | ORAL | Status: AC
  Administered 2019-02-02: 40 meq via ORAL
  Filled 2019-02-02: qty 2

## 2019-02-02 NOTE — H&P (Signed)
History and Physical    Thomas Joseph DOB: 02-22-1933 DOA: 02/02/2019  PCP: Mila Palmer, MD  Patient coming from: Home  I have personally briefly reviewed patient's old medical records in [facility]  Chief Complaint: Shortness of breath, cough, weakness, fatigue, body aches  HPI: Thomas Joseph is a 86 y.o. male with medical history significant of essential hypertension and type 2 diabetes mellitus who presented to the ED via EMS for progressive shortness of breath.  Patient also endorses weakness, fatigue, body aches, in addition to loss of taste/appetite.  Patient reports recently received initial dose of Covid-19 vaccination.  He also reports that his wife at home is positive for Covid-19.  He also states has had very poor oral intake over the past several days due to his loss of taste/smell.  Denies headache, no chest pain, palpitations, no abdominal pain, no lower extremity edema.  ED Course: Temperature 98.2, HR 70, RR 20, BP 151/70, SPO2 85% on room air.  The BC count 5.4, hemoglobin 13.1, platelet 206, sodium 140, potassium 3.4, chloride 102, CO2 26, BUN 26, creatinine 1.47, glucose 133.  D-dimer 1.30, LDH 243, ferritin 428, CRP 22.1, lactic acid 1.6, procalcitonin 0.16.  Chest x-ray with bilateral interstitial edema consistent with multifocal pneumonia.  Covid-19 PCR pending at time of admission.  ED physician referred for admission given hypoxic respiratory failure with highly concerning for Covid-19 viral pneumonia.  Review of Systems: As per HPI otherwise 10 point review of systems negative.    Past Medical History:  Diagnosis Date  . Anemia   . BPH (benign prostatic hyperplasia)   . Diabetes mellitus without complication (HCC)   . ED (erectile dysfunction)   . Family history of adverse reaction to anesthesia    son had some problem years ago, pt unsure of what exact problem was  . Hypertension   . Obesity   . Pneumonia     Past Surgical  History:  Procedure Laterality Date  .  LEFT KNEE ARTHROCOPY    . HERNIA REPAIR    . TRANSURETHRAL RESECTION OF PROSTATE N/A 12/26/2014   Procedure: TRANSURETHRAL RESECTION OF THE PROSTATE;  Surgeon: Malen Gauze, MD;  Location: [facility];  Service: Urology;  Laterality: N/A;     reports that he quit smoking about 34 years ago. He has never used smokeless tobacco. He reports current alcohol use. He reports that he does not use drugs.  Allergies  Allergen Reactions  . Hydrochlorothiazide Other (See Comments)    Reaction not recalled, believes it just made him urinate very frequently  . Lipitor [Atorvastatin] Other (See Comments)    Muscle pain  . Viagra [Sildenafil Citrate] Palpitations    Family History  Problem Relation Age of Onset  . CVA Father   . Hypertension Father   . Leukemia Sister   . Thyroid disease Sister   . Cancer Brother     Family history reviewed and not pertinent   Prior to Admission medications   Medication Sig Start Date End Date Taking? Authorizing Provider  acetaminophen (TYLENOL) 325 MG tablet Take 325-650 mg by mouth every 12 (twelve) hours as needed for mild pain or headache.   Yes [provider]  amLODipine (NORVASC) 10 MG tablet Take 10 mg by mouth daily.   Yes [provider]  aspirin 81 MG chewable tablet Chew 81 mg by mouth every other day.    Yes [provider]  cetirizine (ZYRTEC) 10 MG tablet Take 10 mg  by mouth daily as needed for allergies.   Yes [provider]  ibuprofen (ADVIL,MOTRIN) 600 MG tablet Take 1 tablet (600 mg total) by mouth 3 (three) times daily. 02/01/18  Yes Delo, Riley Lam, MD  latanoprost (XALATAN) 0.005 % ophthalmic solution Place 1 drop into both eyes at bedtime. 01/19/19  Yes [provider]  lisinopril-hydrochlorothiazide (PRINZIDE,ZESTORETIC) 20-12.5 MG tablet Take 1 tablet by mouth daily. 05/30/17  Yes [provider]  meloxicam (MOBIC) 15 MG tablet Take 15 mg by  mouth daily as needed. 05/16/18  Yes [provider]  metFORMIN (GLUCOPHAGE-XR) 500 MG 24 hr tablet Take 1,000 mg by mouth daily.    Yes [provider]  tadalafil (CIALIS) 20 MG tablet Take 20 mg by mouth as needed. 08/23/18  Yes [provider]  benazepril (LOTENSIN) 40 MG tablet Take 40 mg by mouth daily.  02/02/19  [provider]  zolpidem (AMBIEN) 10 MG tablet Take 10 mg by mouth at bedtime as needed for sleep.   02/02/19  [provider]    Physical Exam: Vitals:   02/02/19 1500 02/02/19 1545 02/02/19 1615 02/02/19 1641  BP: (!) 144/76 (!) 150/75 (!) 145/74 103/73  Pulse: 65 66 62 62  Resp: (!) 27 (!) 30 (!) 27 (!) 8  Temp:      TempSrc:      SpO2: 96% 100% 100% 99%  Weight:      Height:        Constitutional: NAD, calm, comfortable, slightly ill in appearance Eyes: PERRL, lids and conjunctivae normal ENMT: Mucous membranes are dry. Posterior pharynx clear of any exudate or lesions.Normal dentition.  Neck: normal, supple, no masses, no thyromegaly Respiratory: clear to auscultation bilaterally, no wheezing, no crackles. Normal respiratory effort. No accessory muscle use.  On 2 L nasal cannula oxygenating 96% Cardiovascular: Regular rate and rhythm, no murmurs / rubs / gallops. No extremity edema. 2+ pedal pulses. No carotid bruits.  Abdomen: no tenderness, no masses palpated. No hepatosplenomegaly. Bowel sounds positive.  Musculoskeletal: no clubbing / cyanosis. No joint deformity upper and lower extremities. Good ROM, no contractures. Normal muscle tone.  Skin: no rashes, lesions, ulcers. No induration Neurologic: CN 2-12 grossly intact. Sensation intact, DTR normal. Strength 5/5 in all 4.  Psychiatric: Normal judgment and insight. Alert and oriented x 3. Normal mood.    Labs on Admission: I have personally reviewed following labs and imaging studies  CBC: Recent Labs  Lab 02/02/19 1345  WBC 5.4  NEUTROABS 4.2  HGB 13.1  HCT  38.3*  MCV 90.8  PLT 206   Basic Metabolic Panel: Recent Labs  Lab 02/02/19 1345  NA 140  K 3.4*  CL 102  CO2 26  GLUCOSE 133*  BUN 26*  CREATININE 1.47*  CALCIUM 8.5*   GFR: Estimated Creatinine Clearance: 48.1 mL/min (A) (by C-G formula based on SCr of 1.47 mg/dL (H)). Liver Function Tests: Recent Labs  Lab 02/02/19 1345  AST 36  ALT 17  ALKPHOS 56  BILITOT 0.7  PROT 7.5  ALBUMIN 3.1*   No results for input(s): LIPASE, AMYLASE in the last 168 hours. No results for input(s): AMMONIA in the last 168 hours. Coagulation Profile: No results for input(s): INR, PROTIME in the last 168 hours. Cardiac Enzymes: No results for input(s): CKTOTAL, CKMB, CKMBINDEX, TROPONINI in the last 168 hours. BNP (last 3 results) No results for input(s): PROBNP in the last 8760 hours. HbA1C: No results for input(s): HGBA1C in the last 72 hours. CBG:  No results for input(s): GLUCAP in the last 168 hours. Lipid Profile: Recent Labs    02/02/19 1345  TRIG 107   Thyroid Function Tests: No results for input(s): TSH, T4TOTAL, FREET4, T3FREE, THYROIDAB in the last 72 hours. Anemia Panel: Recent Labs    02/02/19 1345  FERRITIN 428*   Urine analysis:    Component Value Date/Time   COLORURINE YELLOW 02/01/2018 1750   APPEARANCEUR CLEAR 02/01/2018 1750   LABSPEC 1.021 02/01/2018 1750   PHURINE 5.0 02/01/2018 1750   GLUCOSEU >=500 (A) 02/01/2018 1750   HGBUR NEGATIVE 02/01/2018 1750   BILIRUBINUR NEGATIVE 02/01/2018 1750   KETONESUR NEGATIVE 02/01/2018 1750   PROTEINUR 30 (A) 02/01/2018 1750   UROBILINOGEN 0.2 04/18/2010 0910   NITRITE NEGATIVE 02/01/2018 1750   LEUKOCYTESUR NEGATIVE 02/01/2018 1750    Radiological Exams on Admission: DG Chest Port 1 View  Result Date: 02/02/2019 CLINICAL DATA:  COVID infection suspected. URI symptoms 3 days after COVID vaccine. EXAM: PORTABLE CHEST 1 VIEW COMPARISON:  01/02/2014. FINDINGS: Mediastinum hilar structures are unremarkable. Mild  cardiomegaly. Bilateral interstitial prominence. Interstitial edema and/or pneumonitis could present this fashion. Right base subsegmental atelectasis. No pleural effusion or pneumothorax. Degenerative change thoracic spine. IMPRESSION: 1.  Cardiomegaly.  No pulmonary venous congestion. 2. Bilateral interstitial prominence consistent with interstitial edema and/or pneumonitis. 3.  Right base subsegmental atelectasis. Electronically Signed   By: Maisie Fus  Register   On: 02/02/2019 13:13    EKG: Independently reviewed.   Assessment/Plan Principal Problem:   Acute respiratory failure with hypoxia (HCC) Active Problems:   Essential hypertension   Type 2 diabetes mellitus with complication, without long-term current use of insulin (HCC)   Hypokalemia   Acute respiratory failure with hypoxia Patient presenting with progressive shortness of breath, cough, weakness, fatigue and associated loss of taste/smell.  Patient with positive Covid-19 contacts at home, spouse.  Chest x-ray with bilateral interstitial edema consistent with multifocal pneumonia.  Elevated D-dimer, elevated inflammatory markers to include LDH, ferritin, CRP.  Symptoms and laboratory/x-ray findings consistent with acute Covid-19 pneumonia, although awaiting Covid-19 PCR to return. --Admit to inpatient, telemetry --Start Decadron 6 mg IV daily --Await return of Covid-19 test before starting remdesivir/Actemra --Continue supplemental oxygen, titrate to maintain SPO2 greater than 92% --Continue supportive care with vitamin C, zinc, Tylenol --Follow CBC, CMP, D-dimer, ferritin, and CRP daily --Continue airborne/contact isolation precautions  Hypokalemia Potassium 3.4 on admission, will replete. --Repeat electrolytes in a.m. to include magnesium  Essential hypertension BP 151/70.  On amlodipine 10 mg p.o. daily and lisinopril-HCTZ 20-12.5 mg p.o. daily at home. --Continue amlodipine --Hold lisinopril-HCTZ with a slight bump in  creatinine. --Continue to monitor blood pressure closely  Type 2 diabetes mellitus, non-insulin-dependent On Metformin XR 1000 mg p.o. daily at home. --Hold oral hypoglycemics while inpatient --Insulin sliding scale for coverage --Hemoglobin A1c   DVT prophylaxis: Lovenox Code Status: Full code Family Communication: Discussed with patient extensively at bedside Disposition Plan: Anticipate discharge home once oxygen has been titrated off Consults called: None Admission status: Inpatient   Severity of Illness: The appropriate patient status for this patient is INPATIENT. Inpatient status is judged to be reasonable and necessary in order to provide the required intensity of service to ensure the patient's safety. The patient's presenting symptoms, physical exam findings, and initial radiographic and laboratory data in the context of their chronic comorbidities is felt to place them at high risk for further clinical deterioration. Furthermore, it is not anticipated that the patient will be medically stable for  discharge from the hospital within 2 midnights of admission. The following factors support the patient status of inpatient.   " The patient's presenting symptoms include shortness of breath, cough, loss of taste/smell, body aches, weakness, fatigue " The worrisome physical exam findings include hypoxia " The initial radiographic and laboratory data are worrisome because of elevated D-dimer, elevated inflammatory markers to include LDH, ferritin, CRP " The chronic co-morbidities include hypertension, diabetes, known Covid-19 contact at home (spouse).   * I certify that at the point of admission it is my clinical judgment that the patient will require inpatient hospital care spanning beyond 2 midnights from the point of admission due to high intensity of service, high risk for further deterioration and high frequency of surveillance required.*    Mccartney Brucks J [place] DO [facility] Available via Epic secure chat 7am-7pm After these hours, please refer to coverage provider listed on [email or web address] 02/02/2019, 4:49 PM

## 2019-02-02 NOTE — ED Provider Notes (Signed)
Dickey Thomas Joseph   CSN: [id number] Arrival date & time: 02/02/19  1153     History Chief Complaint  Patient presents with  . Shortness of Breath    Thomas Joseph is a 85 y.o. male.  86 yo M with a chief complaint of cough congestion fever chills myalgias nausea vomiting diarrhea shortness of breath and loss of taste and smell.  Going on for the past couple weeks.  Corresponded couple days after he had the coronavirus vaccination.  He and his wife are both sick.  Wife has been having more cough anything else.  The history is provided by the patient.  Shortness of Breath Severity:  Moderate Onset quality:  Gradual Timing:  Constant Progression:  Worsening Chronicity:  New Relieved by:  Nothing Worsened by:  Nothing Ineffective treatments:  None tried Associated symptoms: cough, fever and vomiting   Associated symptoms: no abdominal pain, no chest pain, no headaches and no rash        Past Medical History:  Diagnosis Date  . Anemia   . BPH (benign prostatic hyperplasia)   . Diabetes mellitus without complication (Thomas Joseph)   . ED (erectile dysfunction)   . Family history of adverse reaction to anesthesia    son had some problem years ago, pt unsure of what exact problem was  . Hypertension   . Obesity   . Pneumonia     Patient Active Problem List   Diagnosis Date Noted  . BPH (benign prostatic hyperplasia) 12/26/2014    Past Surgical History:  Procedure Laterality Date  .  LEFT KNEE ARTHROCOPY    . HERNIA REPAIR    . TRANSURETHRAL RESECTION OF PROSTATE N/A 12/26/2014   Procedure: TRANSURETHRAL RESECTION OF THE PROSTATE;  Surgeon: Cleon Gustin, MD;  Location: [facility];  Service: Urology;  Laterality: N/A;       Family History  Problem Relation Age of Onset  . CVA Father   . Hypertension Father   . Leukemia Sister   . Thyroid disease Sister   . Cancer Brother     Social History   Tobacco Use  . Smoking  status: Former Smoker    Quit date: 11/28/1984    Years since quitting: 34.2  . Smokeless tobacco: Never Used  Substance Use Topics  . Alcohol use: Yes    Comment: occasional glass of wine  . Drug use: No    Home Medications Prior to Admission medications   Medication Sig Start Date End Date Taking? Authorizing Provider  acetaminophen (TYLENOL) 325 MG tablet Take 325-650 mg by mouth every 12 (twelve) hours as needed for mild pain or headache.    [provider]  amLODipine (NORVASC) 10 MG tablet Take 10 mg by mouth daily.    [provider]  aspirin 81 MG chewable tablet Chew 81 mg by mouth every other day.     [provider]  cetirizine (ZYRTEC) 10 MG tablet Take 10 mg by mouth daily as needed for allergies.    [provider]  ibuprofen (ADVIL,MOTRIN) 600 MG tablet Take 1 tablet (600 mg total) by mouth 3 (three) times daily. 02/01/18   Veryl Speak, MD  lisinopril-hydrochlorothiazide (PRINZIDE,ZESTORETIC) 20-12.5 MG tablet Take 1 tablet by mouth daily. 05/30/17   [provider]  meloxicam (MOBIC) 15 MG tablet Take 15 mg by mouth daily as needed. 05/16/18   [provider]  metFORMIN (GLUCOPHAGE-XR) 500 MG 24 hr tablet Take 1,000 mg by mouth daily.  [provider]  rosuvastatin (CRESTOR) 20 MG tablet Take 20 mg by mouth every 14 (fourteen) days.     [provider]  tadalafil (CIALIS) 20 MG tablet Take 20 mg by mouth as needed. 08/23/18   [provider]  tadalafil (CIALIS) 5 MG tablet Take 5 mg by mouth daily as needed for erectile dysfunction.    [provider]  benazepril (LOTENSIN) 40 MG tablet Take 40 mg by mouth daily.  02/02/19  [provider]  zolpidem (AMBIEN) 10 MG tablet Take 10 mg by mouth at bedtime as needed for sleep.   02/02/19  [provider]    Allergies    Hydrochlorothiazide, Lipitor [atorvastatin], and Viagra [sildenafil citrate]  Review of Systems     Review of Systems  Constitutional: Positive for chills and fever.  HENT: Negative for congestion and facial swelling.   Eyes: Negative for discharge and visual disturbance.  Respiratory: Positive for cough and shortness of breath.   Cardiovascular: Negative for chest pain and palpitations.  Gastrointestinal: Positive for diarrhea, nausea and vomiting. Negative for abdominal pain.  Musculoskeletal: Positive for myalgias. Negative for arthralgias.  Skin: Negative for color change and rash.  Neurological: Negative for tremors, syncope and headaches.  Psychiatric/Behavioral: Negative for confusion and dysphoric mood.    Physical Exam Updated Vital Signs BP (!) 144/76   Pulse 65   Temp 98.2 F (36.8 C) (Oral)   Resp (!) 27   Ht 6' (1.829 m)   Wt 107 kg   SpO2 96%   BMI 32.01 kg/m   Physical Exam Vitals and nursing Joseph reviewed.  Constitutional:      Appearance: He is well-developed.  HENT:     Head: Normocephalic and atraumatic.  Eyes:     Pupils: Pupils are equal, round, and reactive to light.  Neck:     Vascular: No JVD.  Cardiovascular:     Rate and Rhythm: Normal rate and regular rhythm.     Heart sounds: No murmur. No friction rub. No gallop.   Pulmonary:     Effort: No respiratory distress.     Breath sounds: No wheezing.  Abdominal:     General: There is no distension.     Tenderness: There is no guarding or rebound.  Musculoskeletal:        General: Normal range of motion.     Cervical back: Normal range of motion and neck supple.  Skin:    Coloration: Skin is not pale.     Findings: No rash.  Neurological:     Mental Status: He is alert and oriented to person, place, and time.  Psychiatric:        Behavior: Behavior normal.     ED Results / Procedures / Treatments   Labs (all labs ordered are listed, but only abnormal results are displayed) Labs Reviewed  CBC WITH DIFFERENTIAL/PLATELET - Abnormal; Notable for the following components:      Result  Value   HCT 38.3 (*)    All other components within normal limits  COMPREHENSIVE METABOLIC PANEL - Abnormal; Notable for the following components:   Potassium 3.4 (*)    Glucose, Bld 133 (*)    BUN 26 (*)    Creatinine, Ser 1.47 (*)    Calcium 8.5 (*)    Albumin 3.1 (*)    GFR calc non Af Amer 43 (*)    GFR calc Af Amer 50 (*)    All other components within normal limits  D-DIMER, QUANTITATIVE (NOT AT [facility]) - Abnormal; Notable for the following components:   D-Dimer, Quant 1.30 (*)    All other components within normal limits  LACTATE DEHYDROGENASE - Abnormal; Notable for the following components:   LDH 243 (*)    All other components within normal limits  FERRITIN - Abnormal; Notable for the following components:   Ferritin 428 (*)    All other components within normal limits  FIBRINOGEN - Abnormal; Notable for the following components:   Fibrinogen 728 (*)    All other components within normal limits  C-REACTIVE PROTEIN - Abnormal; Notable for the following components:   CRP 22.1 (*)    All other components within normal limits  SARS CORONAVIRUS 2 (TAT 6-24 HRS)  CULTURE, BLOOD (ROUTINE X 2)  CULTURE, BLOOD (ROUTINE X 2)  LACTIC ACID, PLASMA  PROCALCITONIN  TRIGLYCERIDES  LACTIC ACID, PLASMA    EKG None  Radiology DG Chest Port 1 View  Result Date: 02/02/2019 CLINICAL DATA:  COVID infection suspected. URI symptoms 3 days after COVID vaccine. EXAM: PORTABLE CHEST 1 VIEW COMPARISON:  01/02/2014. FINDINGS: Mediastinum hilar structures are unremarkable. Mild cardiomegaly. Bilateral interstitial prominence. Interstitial edema and/or pneumonitis could present this fashion. Right base subsegmental atelectasis. No pleural effusion or pneumothorax. Degenerative change thoracic spine. IMPRESSION: 1.  Cardiomegaly.  No pulmonary venous congestion. 2. Bilateral interstitial prominence consistent with interstitial edema and/or pneumonitis. 3.  Right base subsegmental atelectasis.  Electronically Signed   By: Maisie Fus  Register   On: 02/02/2019 13:13    Procedures Procedures (including critical care time)  Medications Ordered in ED Medications - No data to display  ED Course  I have reviewed the triage vital signs and the nursing notes.  Pertinent labs & imaging results that were available during my care of the patient were reviewed by me and considered in my medical decision making (see chart for details).    MDM Rules/Calculators/A&P                      86 yo M with a cc of cough congestion fever chills myalgias nausea vomiting diarrhea loss of taste and smell.  Going on for about 2 weeks.  Went to urgent care today and found to be hypoxic on room air.  Sent here for admission.  Interestingly the patient's wife tested positive but he tested negative on the rapid assay.  Suspect that he likely has the coronavirus based on his symptomatology.  Chest x-ray reviewed by me with interstitial infiltrates.  D-dimer elevated.  Discussed with hospitalist admission.  CRITICAL CARE Performed by: Rae Roam   Total critical care time: 35 minutes  Critical care time was exclusive of separately billable procedures and treating other patients.  Critical care was necessary to treat or prevent imminent or life-threatening deterioration.  Critical care was time spent personally by me on the following activities: development of treatment plan with patient and/or surrogate as well as nursing, discussions with consultants, evaluation of patient's response to treatment, examination of patient, obtaining history from patient or surrogate, ordering and performing treatments and interventions, ordering and review of laboratory studies, ordering and review of radiographic studies, pulse oximetry and re-evaluation of patient's condition.  The patients results and plan were reviewed and discussed.   Any x-rays performed were independently reviewed by myself.   Differential  diagnosis were considered with the presenting HPI.  Medications - No data to display  Vitals:   02/02/19 1300 02/02/19 1306 02/02/19 1430  02/02/19 1500  BP: (!) 166/83 (!) 166/83 (!) 151/70 (!) 144/76  Pulse: 68 69 70 65  Resp: (!) 29 (!) 29 20 (!) 27  Temp:      TempSrc:      SpO2: 98% 98% 99% 96%  Weight:      Height:        Final diagnoses:  Acute respiratory failure with hypoxia (HCC)  Suspected COVID-19 virus infection    Admission/ observation were discussed with the admitting physician, patient and/or family and they are comfortable with the plan.    Final Clinical Impression(s) / ED Diagnoses Final diagnoses:  Acute respiratory failure with hypoxia (HCC)  Suspected COVID-19 virus infection    Rx / DC Orders ED Discharge Orders    None       Melene Plan, DO 02/02/19 1541

## 2019-02-02 NOTE — ED Triage Notes (Signed)
Pt c/o SOB, chills, HA, congestion, running nose and loss of taste and smell 3 days after received his 1st COVID vaccine.

## 2019-02-02 NOTE — ED Triage Notes (Signed)
Patient arrived by EMS c/o SOB.   Paitent had COVID vaccine recently. Patient started experiencing COVID symptoms.   HX DM, HTN.

## 2019-02-02 NOTE — ED Notes (Signed)
Ems called for transport to ED. Pt is stable at this time. O2 95% on 2l/min/[place].

## 2019-02-02 NOTE — Discharge Instructions (Signed)
86 year old male with history of non-insulin-dependent DM, HTN, CKD, anemia, BPH comes in for 6-day history of URI symptoms.  Symptoms started 3 days after Covid vaccine.  Has cough, rhinorrhea, loss of taste and smell, shortness of breath.  Mid chest pain on exertion, denies current chest pain.  Patient was found tachypneic at 28 with O2 sat 85% on room air.  He was put on 2 L oxygen, which increased his O2 sat to 94%.  He is fatigued in appearance, but alert and oriented, nontoxic.  Heart: Regular rate and rhythm Lungs: Slight increased work of breathing, no accessory muscle use.  Lung exam limited due to shallow short breaths.  No obvious adventitious lung sounds.  Rapid Covid: Negative, however, wife also in department, with positive rapid Covid test.  EKG: NSR, 75bpm, no acute ST changes, no obvious change from prior EKG.  Given history and exam, patient transferred via EMS to emergency department for further evaluation.

## 2019-02-02 NOTE — ED Provider Notes (Signed)
[facility]    CSN: [id number] Arrival date & time: 02/02/19  0948      History   Chief Complaint Chief Complaint  Patient presents with  . Shortness of Breath    HPI Thomas Joseph is a 86 y.o. male.   86 year old male with history of noninsulin dependant DM, HTN, CKD, anemia, BPH comes in for 6 day of URI symptoms. States symptoms started 3 days after COVID vaccine. Denies any side effects/symptoms within 36 hours of vaccine. Wife with similar symptoms. He has cough, rhinorrhea, loss of taste, shortness of breath. Denies fever. Experiences mid chest pain on exertion at times. Denies current chest pain. Nausea without vomiting. Former smoker.   Last a1c 6.7     Past Medical History:  Diagnosis Date  . Anemia   . BPH (benign prostatic hyperplasia)   . Chronic kidney disease   . Diabetes mellitus without complication (HCC)   . ED (erectile dysfunction)   . Family history of adverse reaction to anesthesia    son had some problem years ago, pt unsure of what exact problem was  . Hypertension   . Obesity   . Pneumonia     Patient Active Problem List   Diagnosis Date Noted  . BPH (benign prostatic hyperplasia) 12/26/2014    Past Surgical History:  Procedure Laterality Date  .  LEFT KNEE ARTHROCOPY    . HERNIA REPAIR    . TRANSURETHRAL RESECTION OF PROSTATE N/A 12/26/2014   Procedure: TRANSURETHRAL RESECTION OF THE PROSTATE;  Surgeon: Malen Gauze, MD;  Location: [facility];  Service: Urology;  Laterality: N/A;       Home Medications    Prior to Admission medications   Medication Sig Start Date End Date Taking? Authorizing Provider  acetaminophen (TYLENOL) 325 MG tablet Take 325-650 mg by mouth every 12 (twelve) hours as needed for mild pain or headache.    [provider]  amLODipine (NORVASC) 10 MG tablet Take 10 mg by mouth daily.    [provider]  aspirin 81 MG chewable tablet Chew 81 mg by mouth every other day.      [provider]  cetirizine (ZYRTEC) 10 MG tablet Take 10 mg by mouth daily as needed for allergies.    [provider]  ibuprofen (ADVIL,MOTRIN) 600 MG tablet Take 1 tablet (600 mg total) by mouth 3 (three) times daily. 02/01/18   Geoffery Lyons, MD  lisinopril-hydrochlorothiazide (PRINZIDE,ZESTORETIC) 20-12.5 MG tablet Take 1 tablet by mouth daily. 05/30/17   [provider]  meloxicam (MOBIC) 15 MG tablet Take 15 mg by mouth daily as needed. 05/16/18   [provider]  metFORMIN (GLUCOPHAGE-XR) 500 MG 24 hr tablet Take 1,000 mg by mouth daily.     [provider]  rosuvastatin (CRESTOR) 20 MG tablet Take 20 mg by mouth every 14 (fourteen) days.     [provider]  tadalafil (CIALIS) 20 MG tablet Take 20 mg by mouth as needed. 08/23/18   [provider]  tadalafil (CIALIS) 5 MG tablet Take 5 mg by mouth daily as needed for erectile dysfunction.    [provider]  benazepril (LOTENSIN) 40 MG tablet Take 40 mg by mouth daily.  02/02/19  [provider]  zolpidem (AMBIEN) 10 MG tablet Take 10 mg by mouth at bedtime as needed for sleep.   02/02/19  [provider]    Family History Family History  Problem Relation Age of Onset  .  CVA Father   . Hypertension Father   . Leukemia Sister   . Thyroid disease Sister   . Cancer Brother     Social History Social History   Tobacco Use  . Smoking status: Former Smoker    Quit date: 11/28/1984    Years since quitting: 34.2  . Smokeless tobacco: Never Used  Substance Use Topics  . Alcohol use: Yes    Comment: occasional glass of wine  . Drug use: No     Allergies   Hydrochlorothiazide, Lipitor [atorvastatin], and Viagra [sildenafil citrate]   Review of Systems Review of Systems  Reason unable to perform ROS: See HPI as above.     Physical Exam Triage Vital Signs ED Triage Vitals [02/02/19 1002]  Enc Vitals Group     BP (!) 163/75     Pulse Rate  78     Resp (!) 28     Temp 98.4 F (36.9 C)     Temp Source Oral     SpO2 (!) 85 %     Weight      Height      Head Circumference      Peak Flow      Pain Score      Pain Loc      Pain Edu?      Excl. in [place]?    No data found.  Updated Vital Signs BP (!) 163/75 (BP Location: Left Arm)   Pulse 78   Temp 98.4 F (36.9 C) (Oral)   Resp (!) 28   SpO2 94%   Physical Exam Constitutional:      Appearance: Normal appearance.     Comments: Fatigued. Nontoxic in appearance.   HENT:     Head: Normocephalic and atraumatic.     Mouth/Throat:     Mouth: Mucous membranes are moist.     Pharynx: Oropharynx is clear. Uvula midline.  Cardiovascular:     Rate and Rhythm: Normal rate and regular rhythm.     Heart sounds: Normal heart sounds. No murmur. No friction rub. No gallop.   Pulmonary:     Effort: No prolonged expiration or retractions.     Comments: Slight increase work of breath. No accessory muscle use. Lung exam limited due to shallow short breaths, no obvious adventitious lung sounds.  Musculoskeletal:     Cervical back: Normal range of motion and neck supple.  Neurological:     General: No focal deficit present.     Mental Status: He is alert and oriented to person, place, and time.      UC Treatments / Results  Labs (all labs ordered are listed, but only abnormal results are displayed) Labs Reviewed  POC SARS CORONAVIRUS 2 AG -  ED - Normal    EKG   Radiology No results found.  Procedures Procedures (including critical care time)  Medications Ordered in UC Medications - No data to display  Initial Impression / Assessment and Plan / UC Course  I have reviewed the triage vital signs and the nursing notes.  Pertinent labs & imaging results that were available during my care of the patient were reviewed by me and considered in my medical decision making (see chart for details).    86 year old male with history of non-insulin-dependent DM, HTN, CKD,  anemia, BPH comes in for 6-day history of URI symptoms.  Symptoms started 3 days after Covid vaccine.  Has cough, rhinorrhea, loss of taste and smell, shortness of breath.  Mid chest  pain on exertion, denies current chest pain.  Patient was found tachypneic at 28 with O2 sat 85% on room air.  He was put on 2 L oxygen, which increased his O2 sat to 94%.  He is fatigued in appearance, but alert and oriented, nontoxic.  Heart: Regular rate and rhythm Lungs: Slight increased work of breathing, no accessory muscle use.  Lung exam limited due to shallow short breaths.  No obvious adventitious lung sounds.  Rapid Covid: Negative, however, wife also in department, with positive rapid Covid test.  EKG: NSR, 75bpm, no acute ST changes, no obvious change from prior EKG.  Given history and exam, patient transferred via EMS to emergency department for further evaluation.  Final Clinical Impressions(s) / UC Diagnoses   Final diagnoses:  Suspected COVID-19 virus infection  Acute respiratory failure with hypoxia [facility])    ED Prescriptions    None     PDMP not reviewed this encounter.   Belinda Fisher, PA-C 02/02/19 1128

## 2019-02-03 LAB — D-DIMER, QUANTITATIVE: D-Dimer, Quant: 5.75 ug/mL-FEU — ABNORMAL HIGH (ref 0.00–0.50)

## 2019-02-03 LAB — COMPREHENSIVE METABOLIC PANEL
ALT: 17 U/L (ref 0–44)
AST: 40 U/L (ref 15–41)
Albumin: 2.9 g/dL — ABNORMAL LOW (ref 3.5–5.0)
Alkaline Phosphatase: 57 U/L (ref 38–126)
Anion gap: 15 (ref 5–15)
BUN: 25 mg/dL — ABNORMAL HIGH (ref 8–23)
CO2: 21 mmol/L — ABNORMAL LOW (ref 22–32)
Calcium: 8.4 mg/dL — ABNORMAL LOW (ref 8.9–10.3)
Chloride: 104 mmol/L (ref 98–111)
Creatinine, Ser: 1.26 mg/dL — ABNORMAL HIGH (ref 0.61–1.24)
GFR calc Af Amer: >60 mL/min (ref >60)
GFR calc non Af Amer: 52 mL/min — ABNORMAL LOW (ref >60)
Glucose, Bld: 157 mg/dL — ABNORMAL HIGH (ref 70–99)
Potassium: 4.7 mmol/L (ref 3.5–5.1)
Sodium: 140 mmol/L (ref 135–145)
Total Bilirubin: 1.5 mg/dL — ABNORMAL HIGH (ref 0.3–1.2)
Total Protein: 7.2 g/dL (ref 6.5–8.1)

## 2019-02-03 LAB — GLUCOSE, CAPILLARY
Glucose-Capillary: 172 mg/dL — ABNORMAL HIGH (ref 70–99)
Glucose-Capillary: 215 mg/dL — ABNORMAL HIGH (ref 70–99)
Glucose-Capillary: 244 mg/dL — ABNORMAL HIGH (ref 70–99)
Glucose-Capillary: 203 mg/dL — ABNORMAL HIGH (ref 70–99)

## 2019-02-03 LAB — CBC WITH DIFFERENTIAL/PLATELET
Abs Immature Granulocytes: 0.03 10*3/uL (ref 0.00–0.07)
Basophils Absolute: 0 10*3/uL (ref 0.0–0.1)
Basophils Relative: 0 %
Eosinophils Absolute: 0 10*3/uL (ref 0.0–0.5)
Eosinophils Relative: 0 %
HCT: 42.8 % (ref 39.0–52.0)
Hemoglobin: 14.2 g/dL (ref 13.0–17.0)
Immature Granulocytes: 1 %
Lymphocytes Relative: 15 %
Lymphs Abs: 0.7 10*3/uL (ref 0.7–4.0)
MCH: 30.9 pg (ref 26.0–34.0)
MCHC: 33.2 g/dL (ref 30.0–36.0)
MCV: 93 fL (ref 80.0–100.0)
Monocytes Absolute: 0.1 10*3/uL (ref 0.1–1.0)
Monocytes Relative: 2 %
Neutro Abs: 3.7 10*3/uL (ref 1.7–7.7)
Neutrophils Relative %: 82 %
Platelets: 192 10*3/uL (ref 150–400)
RBC: 4.6 MIL/uL (ref 4.22–5.81)
RDW: 12.9 % (ref 11.5–15.5)
WBC: 4.5 10*3/uL (ref 4.0–10.5)
nRBC: 0 % (ref 0.0–0.2)

## 2019-02-03 LAB — FERRITIN: Ferritin: 423 ng/mL — ABNORMAL HIGH (ref 24–336)

## 2019-02-03 LAB — C-REACTIVE PROTEIN: CRP: 21.3 mg/dL — ABNORMAL HIGH (ref <1.0)

## 2019-02-03 LAB — MAGNESIUM: Magnesium: 1.9 mg/dL (ref 1.7–2.4)

## 2019-02-03 MED ORDER — ENOXAPARIN SODIUM 40 MG/0.4ML ~~LOC~~ SOLN
40.0000 mg | Freq: Two times a day (BID) | SUBCUTANEOUS | Status: DC
  Administered 2019-02-03 – 2019-02-08 (×11): 40 mg via SUBCUTANEOUS
  Filled 2019-02-03 (×11): qty 0.4

## 2019-02-03 NOTE — Progress Notes (Addendum)
PROGRESS NOTE  Thomas Joseph WER:[id number] DOB: [birth year] DOA: 02/02/2019 PCP: Mila Palmer, MD  HPI/Recap of past 24 hours:   Subjective patient seen and examined at bedside he stated he feels much better.  Patient's wife has Covid.  Patient does have symptoms of Covid but he is negative.  Assessment/Plan: Principal Problem:   Acute respiratory failure with hypoxia (HCC) Active Problems:   Essential hypertension   Type 2 diabetes mellitus with complication, without long-term current use of insulin (HCC)   Hypokalemia Acute respiratory failure with hypoxia  Patient presented with progressive shortness of breath cough weakness fatigue associated with loss of taste and smell. He had a positive COVID-19 contact with his wife at home his chest x-ray did show interstitial edema consistent with multifocal pneumonia elevated D-dimer elevated inflammatory markers to include LDH ferritin CRP. So his symptoms and laboratory findings were consistent with Covid 19 pneumonia but his COVID-19 PCR test was negative We will continue airborne and contact precaution. Continue Decadron 6 mg IV Continue oxygen nasal cannula Continue supportive care with zinc vitamin C Tylenol  Hypokalemia Improved --Repeat electrolytes in a.m. to include magnesium  Essential hypertension Continue amlodipine  We will continue to hold lisinopril-HCTZ 20-12.5 mg p.o. daily due to a bump in his creatinine. We will continue to monitor blood pressure and creatinine closely and reintroduce lisinopril and hydrochlorothiazide when needed.  Type 2 diabetes mellitus, non-insulin-dependent Uncontrolled. His Metformin is on hold due to increasing creatinine. We will continue insulin sliding scale  Code Status: Full  Severity of Illness: The appropriate patient status for this patient is INPATIENT. Inpatient status is judged to be reasonable and necessary in order to provide the required intensity of service to  ensure the patient's safety. The patient's presenting symptoms, physical exam findings, and initial radiographic and laboratory data in the context of their chronic comorbidities is felt to place them at high risk for further clinical deterioration. Furthermore, it is not anticipated that the patient will be medically stable for discharge from the hospital within 2 midnights of admission. The following factors support the patient status of inpatient.   " Still requiring antibiotics and IV as well as oxygen * I certify that at the point of admission it is my clinical judgment that the patient will require inpatient hospital care spanning beyond 2 midnights from the point of admission due to high intensity of service, high risk for further deterioration and high frequency of surveillance required.*    Family Communication: None at bedside  Disposition Plan: Home when stable   Consultants:  None  Procedures:  None  Antimicrobials:  Antibiotics  DVT prophylaxis: Lovenox   Objective: Vitals:   02/02/19 1747 02/02/19 1841 02/02/19 2146 02/03/19 0548  BP: (!) 161/88 (!) 167/79 (!) 170/79 (!) 141/76  Pulse: 68 74 73 66  Resp: (!) 33 (!) 22 (!) 22 20  Temp:  97.7 F (36.5 C) 99.4 F (37.4 C) 98.2 F (36.8 C)  TempSrc:  Oral Oral Oral  SpO2: 99% 96% 95% 96%  Weight:      Height:        Intake/Output Summary (Last 24 hours) at 02/03/2019 0840 Last data filed at 02/03/2019 0600 Gross per 24 hour  Intake 881.38 ml  Output 850 ml  Net 31.38 ml   Filed Weights   02/02/19 1209  Weight: 107 kg   Body mass index is 32.01 kg/m.  Exam:  . General: 86 y.o. year-old male well developed well nourished in no  acute distress.  Alert and oriented x3. . Cardiovascular: Regular rate and rhythm with no rubs or gallops.  No thyromegaly or JVD noted.   Marland Kitchen Respiratory: Clear to auscultation with no wheezes or rales. Good inspiratory effort. . Abdomen: Soft nontender nondistended with normal  bowel sounds x4 quadrants. . Musculoskeletal: No lower extremity edema. 2/4 pulses in all 4 extremities. . Skin: No ulcerative lesions noted or rashes, . Psychiatry: Mood is appropriate for condition and setting    Data Reviewed: CBC: Recent Labs  Lab 02/02/19 1345 02/02/19 1910 02/03/19 0334  WBC 5.4 5.1 4.5  NEUTROABS 4.2  --  3.7  HGB 13.1 13.7 14.2  HCT 38.3* 41.0 42.8  MCV 90.8 92.3 93.0  PLT 206 213 192   Basic Metabolic Panel: Recent Labs  Lab 02/02/19 1345 02/02/19 1910 02/03/19 0334  NA 140  --  140  K 3.4*  --  4.7  CL 102  --  104  CO2 26  --  21*  GLUCOSE 133*  --  157*  BUN 26*  --  25*  CREATININE 1.47* 1.41* 1.26*  CALCIUM 8.5*  --  8.4*  MG  --   --  1.9   GFR: Estimated Creatinine Clearance: 56.2 mL/min (A) (by C-G formula based on SCr of 1.26 mg/dL (H)). Liver Function Tests: Recent Labs  Lab 02/02/19 1345 02/03/19 0334  AST 36 40  ALT 17 17  ALKPHOS 56 57  BILITOT 0.7 1.5*  PROT 7.5 7.2  ALBUMIN 3.1* 2.9*   No results for input(s): LIPASE, AMYLASE in the last 168 hours. No results for input(s): AMMONIA in the last 168 hours. Coagulation Profile: No results for input(s): INR, PROTIME in the last 168 hours. Cardiac Enzymes: No results for input(s): CKTOTAL, CKMB, CKMBINDEX, TROPONINI in the last 168 hours. BNP (last 3 results) No results for input(s): PROBNP in the last 8760 hours. HbA1C: Recent Labs    02/02/19 1910  HGBA1C 7.0*   CBG: Recent Labs  Lab 02/02/19 2144 02/03/19 0739  GLUCAP 127* 172*   Lipid Profile: Recent Labs    02/02/19 1345  TRIG 107   Thyroid Function Tests: No results for input(s): TSH, T4TOTAL, FREET4, T3FREE, THYROIDAB in the last 72 hours. Anemia Panel: Recent Labs    02/02/19 1345 02/03/19 0334  FERRITIN 428* 423*   Urine analysis:    Component Value Date/Time   COLORURINE YELLOW 02/01/2018 1750   APPEARANCEUR CLEAR 02/01/2018 1750   LABSPEC 1.021 02/01/2018 1750   PHURINE 5.0  02/01/2018 1750   GLUCOSEU >=500 (A) 02/01/2018 1750   HGBUR NEGATIVE 02/01/2018 1750   BILIRUBINUR NEGATIVE 02/01/2018 1750   KETONESUR NEGATIVE 02/01/2018 1750   PROTEINUR 30 (A) 02/01/2018 1750   UROBILINOGEN 0.2 04/18/2010 0910   NITRITE NEGATIVE 02/01/2018 1750   LEUKOCYTESUR NEGATIVE 02/01/2018 1750   Sepsis Labs: @LABRCNTIP (procalcitonin:4,lacticidven:4)  ) Recent Results (from the past 240 hour(s))  Blood Culture (routine x 2)     Status: None (Preliminary result)   Collection Time: 02/02/19 12:35 PM   Specimen: BLOOD LEFT HAND  Result Value Ref Range Status   Specimen Description   Final    BLOOD LEFT HAND Performed at [facility], [address] [place], [place]    Special Requests   Final    BOTTLES DRAWN AEROBIC AND ANAEROBIC Blood Culture results may not be optimal due to an inadequate volume of blood received in culture bottles Performed at [facility], [address] [place], [place] [address]    Culture   Final    NO GROWTH < 24 HOURS Performed at [facility], [place] [address] [place], [place] [address]    Report Status PENDING  Incomplete  Blood Culture (routine x 2)     Status: None (Preliminary result)   Collection Time: 02/02/19 12:40 PM   Specimen: BLOOD  Result Value Ref Range Status   Specimen Description   Final    BLOOD LEFT ARM Performed at [facility] [address] [place], [place] [address]    Special Requests   Final    BOTTLES DRAWN AEROBIC ONLY Blood Culture results may not be optimal due to an inadequate volume of blood received in culture bottles Performed at [facility] [address], [place], [place] [address]    Culture   Final    NO GROWTH < 24 HOURS Performed at [facility] [address] [place], [place] [address]    Report Status PENDING  Incomplete      Studies: DG Chest Port 1 View  Result Date:  02/02/2019 CLINICAL DATA:  COVID infection suspected. URI symptoms 3 days after COVID vaccine. EXAM: PORTABLE CHEST 1 VIEW COMPARISON:  01/02/2014. FINDINGS: Mediastinum hilar structures are unremarkable. Mild cardiomegaly. Bilateral interstitial prominence. Interstitial edema and/or pneumonitis could present this fashion. Right base subsegmental atelectasis. No pleural effusion or pneumothorax. Degenerative change thoracic spine. IMPRESSION: 1.  Cardiomegaly.  No pulmonary venous congestion. 2. Bilateral interstitial prominence consistent with interstitial edema and/or pneumonitis. 3.  Right base subsegmental atelectasis. Electronically Signed   By: Whiting   On: 02/02/2019 13:13    Scheduled Meds: . amLODipine  10 mg Oral Daily  . vitamin C  500 mg Oral Daily  . aspirin  81 mg Oral QODAY  . dexamethasone (DECADRON) injection  6 mg Intravenous Q24H  . enoxaparin (LOVENOX) injection  50 mg Subcutaneous Q24H  . insulin aspart  0-5 Units Subcutaneous QHS  . insulin aspart  0-9 Units Subcutaneous TID WC  . Ipratropium-Albuterol  1 puff Inhalation Q6H  . zinc sulfate  220 mg Oral Daily    Continuous Infusions: . sodium chloride 75 mL/hr at 02/03/19 0600     LOS: 1 day     Cristal Deer, MD [facility]  To reach me or the doctor on call, go to: [email or web address] Password [email or web address]  02/03/2019, 8:40 AM

## 2019-02-03 NOTE — Progress Notes (Signed)
Spoke to [employer], wife, on the phone with updates and plan of care.

## 2019-02-03 NOTE — Evaluation (Signed)
Physical Therapy Evaluation Patient Details Name: Thomas Joseph MRN: [id number] DOB: 10-15-1933 Today's Date: 02/03/2019   History of Present Illness  Pt is 86 y.o. male with medical history significant of essential hypertension and type 2 diabetes mellitus who presented to the ED via EMS for progressive shortness of breath.  He had initial COVID vaccine and wife is at home + COVID 19.  Pt admitted with resp failure with hypoxia with suspected COVID  Clinical Impression   Pt admitted with above diagnosis. Pt motivated to work with therapy and was able to transfer and ambulate with min guard to min A.  He does have mild decrease in mobility and decreased gait speed.  O2 sats do decrease to 88% with activity on RA.   Pt currently with functional limitations due to the deficits listed below (see PT Problem List). Pt will benefit from skilled PT to increase their independence and safety with mobility to allow discharge to the venue listed below.       Follow Up Recommendations No PT follow up    Equipment Recommendations  None recommended by PT    Recommendations for Other Services       Precautions / Restrictions Precautions Precautions: Fall      Mobility  Bed Mobility Overal bed mobility: Needs Assistance Bed Mobility: Supine to Sit     Supine to sit: Min assist        Transfers Overall transfer level: Needs assistance Equipment used: Straight cane Transfers: Sit to/from Stand Sit to Stand: Min guard;From elevated surface         General transfer comment: increased time and multiple attempts; cues for safe hand placment  Ambulation/Gait Ambulation/Gait assistance: Min guard Gait Distance (Feet): 50 Feet Assistive device: Straight cane Gait Pattern/deviations: Decreased stride length;Shuffle;Narrow base of support Gait velocity: decreased   General Gait Details: assist for lines/leads; cues for increased step length  Stairs            Wheelchair  Mobility    Modified Rankin (Stroke Patients Only)       Balance Overall balance assessment: Needs assistance Sitting-balance support: No upper extremity supported;Feet supported Sitting balance-Leahy Scale: Good     Standing balance support: Single extremity supported;During functional activity Standing balance-Leahy Scale: Fair                               Pertinent Vitals/Pain Pain Assessment: No/denies pain    Home Living Family/patient expects to be discharged to:: Private residence Living Arrangements: Spouse/significant other Available Help at Discharge: Family Type of Home: House Home Access: Stairs to enter Entrance Stairs-Rails: Psychiatric nurse of Steps: 3 Home Layout: One level Home Equipment: Environmental consultant - 2 wheels;Cane - single point      Prior Function Level of Independence: Independent         Comments: Doesn't typically use cane but did recently due to [place]        Extremity/Trunk Assessment   Upper Extremity Assessment Upper Extremity Assessment: Overall WFL for tasks assessed    Lower Extremity Assessment Lower Extremity Assessment: Overall WFL for tasks assessed    Cervical / Trunk Assessment Cervical / Trunk Assessment: Normal  Communication   Communication: No difficulties  Cognition Arousal/Alertness: Awake/alert Behavior During Therapy: WFL for tasks assessed/performed Overall Cognitive Status: Within Functional Limits for tasks assessed  General Comments General comments (skin integrity, edema, etc.): Pt was on 2 LPM o2 at arrival with sats 99%.  Tried RA and sats 93% at rest but dropped to 88-89% with activity, recovering with <30 seconds rest and cues for deep breaths.  Left on 2 LPM o2 with sats back at 98%.    Exercises     Assessment/Plan    PT Assessment Patient needs continued PT services  PT Problem List Decreased  strength;Decreased mobility;Decreased safety awareness;Decreased activity tolerance;Decreased coordination;Cardiopulmonary status limiting activity;Decreased balance;Decreased knowledge of use of DME       PT Treatment Interventions DME instruction;Therapeutic activities;Gait training;Therapeutic exercise;Patient/family education;Stair training;Balance training;Functional mobility training    PT Goals (Current goals can be found in the Care Plan section)  Acute Rehab PT Goals Patient Stated Goal: return home; doesn't feel he will need HH therapy PT Goal Formulation: With patient Time For Goal Achievement: 02/17/19 Potential to Achieve Goals: Good    Frequency Min 3X/week   Barriers to discharge        Co-evaluation               AM-PAC PT "6 Clicks" Mobility  Outcome Measure Help needed turning from your back to your side while in a flat bed without using bedrails?: A Little Help needed moving from lying on your back to sitting on the side of a flat bed without using bedrails?: None Help needed moving to and from a bed to a chair (including a wheelchair)?: None Help needed standing up from a chair using your arms (e.g., wheelchair or bedside chair)?: None Help needed to walk in hospital room?: None Help needed climbing 3-5 steps with a railing? : A Little 6 Click Score: 22    End of Session Equipment Utilized During Treatment: Gait belt;Oxygen Activity Tolerance: Patient tolerated treatment well Patient left: with chair alarm set;in chair;with call bell/phone within reach Nurse Communication: Mobility status PT Visit Diagnosis: Unsteadiness on feet (R26.81)    Time: 9767-3419 PT Time Calculation (min) (ACUTE ONLY): 25 min   Charges:   PT Evaluation $PT Eval Moderate Complexity: 1 Mod          Royetta Asal, PT Acute Rehab Services Pager [phone] [facility] [phone] [facility] [phone]   Rayetta Humphrey 02/03/2019, 4:02 PM

## 2019-02-04 LAB — COMPREHENSIVE METABOLIC PANEL
ALT: 17 U/L (ref 0–44)
AST: 30 U/L (ref 15–41)
Albumin: 2.7 g/dL — ABNORMAL LOW (ref 3.5–5.0)
Alkaline Phosphatase: 51 U/L (ref 38–126)
Anion gap: 9 (ref 5–15)
BUN: 35 mg/dL — ABNORMAL HIGH (ref 8–23)
CO2: 22 mmol/L (ref 22–32)
Calcium: 8.4 mg/dL — ABNORMAL LOW (ref 8.9–10.3)
Chloride: 106 mmol/L (ref 98–111)
Creatinine, Ser: 1.22 mg/dL (ref 0.61–1.24)
GFR calc Af Amer: >60 mL/min (ref >60)
GFR calc non Af Amer: 54 mL/min — ABNORMAL LOW (ref >60)
Glucose, Bld: 225 mg/dL — ABNORMAL HIGH (ref 70–99)
Potassium: 4.3 mmol/L (ref 3.5–5.1)
Sodium: 137 mmol/L (ref 135–145)
Total Bilirubin: 0.7 mg/dL (ref 0.3–1.2)
Total Protein: 6.8 g/dL (ref 6.5–8.1)

## 2019-02-04 LAB — CBC WITH DIFFERENTIAL/PLATELET
Abs Immature Granulocytes: 0.02 10*3/uL (ref 0.00–0.07)
Basophils Absolute: 0 10*3/uL (ref 0.0–0.1)
Basophils Relative: 0 %
Eosinophils Absolute: 0 10*3/uL (ref 0.0–0.5)
Eosinophils Relative: 0 %
HCT: 38 % — ABNORMAL LOW (ref 39.0–52.0)
Hemoglobin: 12.8 g/dL — ABNORMAL LOW (ref 13.0–17.0)
Immature Granulocytes: 0 %
Lymphocytes Relative: 9 %
Lymphs Abs: 0.6 10*3/uL — ABNORMAL LOW (ref 0.7–4.0)
MCH: 30.5 pg (ref 26.0–34.0)
MCHC: 33.7 g/dL (ref 30.0–36.0)
MCV: 90.5 fL (ref 80.0–100.0)
Monocytes Absolute: 0.2 10*3/uL (ref 0.1–1.0)
Monocytes Relative: 3 %
Neutro Abs: 5.9 10*3/uL (ref 1.7–7.7)
Neutrophils Relative %: 88 %
Platelets: 272 10*3/uL (ref 150–400)
RBC: 4.2 MIL/uL — ABNORMAL LOW (ref 4.22–5.81)
RDW: 12.6 % (ref 11.5–15.5)
WBC: 6.7 10*3/uL (ref 4.0–10.5)
nRBC: 0 % (ref 0.0–0.2)

## 2019-02-04 LAB — GLUCOSE, CAPILLARY
Glucose-Capillary: 191 mg/dL — ABNORMAL HIGH (ref 70–99)
Glucose-Capillary: 246 mg/dL — ABNORMAL HIGH (ref 70–99)
Glucose-Capillary: 207 mg/dL — ABNORMAL HIGH (ref 70–99)
Glucose-Capillary: 176 mg/dL — ABNORMAL HIGH (ref 70–99)

## 2019-02-04 LAB — MAGNESIUM: Magnesium: 1.8 mg/dL (ref 1.7–2.4)

## 2019-02-04 LAB — D-DIMER, QUANTITATIVE: D-Dimer, Quant: 0.69 ug/mL-FEU — ABNORMAL HIGH (ref 0.00–0.50)

## 2019-02-04 LAB — FERRITIN: Ferritin: 440 ng/mL — ABNORMAL HIGH (ref 24–336)

## 2019-02-04 LAB — C-REACTIVE PROTEIN: CRP: 13.5 mg/dL — ABNORMAL HIGH (ref <1.0)

## 2019-02-04 NOTE — Progress Notes (Addendum)
PROGRESS NOTE  Thomas Joseph TKW:[id number] DOB: 1934/01/09 DOA: 02/02/2019 PCP: Jonathon Jordan, MD  HPI/Recap of past 59 hours: 86 year old male with history significant for hypertension, type 2 diabetes mellitus, who presented to the emergency room with shortness of breath cough weakness fatigue and body aches loss of sense of taste and smell as a result he has had very poor p.o. intake and requiring oxygen..  Patient reports recently received initial dose of Covid-19 vaccination.     Subjective patient seen and examined at bedside he stated he feels much better.  Patient's wife has Covid.  Patient does have symptoms of Covid but he is negative.  February 03, 2022. Subjective: Patient seen and examined at bedside he stated he is doing well denies any complaint no cough no fever.  Assessment/Plan: Principal Problem:   Acute respiratory failure with hypoxia (HCC) Active Problems:   Essential hypertension   Type 2 diabetes mellitus with complication, without long-term current use of insulin (HCC)   Hypokalemia Acute respiratory failure with hypoxia  Patient presented with progressive shortness of breath cough weakness fatigue associated with loss of taste and smell. He had a positive COVID-19 contact with his wife at home wife was seen in the emergency room but discharged home.  She is still coughing she said and she is being quarantined because she has 2 other sons living with them.  His chest x-ray did show interstitial edema consistent with multifocal pneumonia elevated D-dimer elevated inflammatory markers to include LDH ferritin CRP. So his symptoms and laboratory findings were consistent with Covid 19 pneumonia but his COVID-19 PCR test was negative We will continue airborne and contact precaution. Continue Decadron 6 mg IV Continue oxygen nasal cannula Continue supportive care with zinc vitamin C Tylenol I will reorder a PCR COVID-19 to recheck and confirm negativity of  positivity since all of his markers are consistent with COVID-19 We will begin to attempt to wean him off oxygen  Hypokalemia Improved --Repeat electrolytes in a.m. to include magnesium  Essential hypertension Continue amlodipine  We will continue to hold lisinopril-HCTZ 20-12.5 mg p.o. daily due to a bump in his creatinine. We will continue to monitor blood pressure and creatinine closely and reintroduce lisinopril and hydrochlorothiazide when needed.  Type 2 diabetes mellitus, non-insulin-dependent Uncontrolled. His Metformin is on hold due to increasing creatinine. We will continue insulin sliding scale  Code Status: Full  Severity of Illness: The appropriate patient status for this patient is INPATIENT. Inpatient status is judged to be reasonable and necessary in order to provide the required intensity of service to ensure the patient's safety. The patient's presenting symptoms, physical exam findings, and initial radiographic and laboratory data in the context of their chronic comorbidities is felt to place them at high risk for further clinical deterioration. Furthermore, it is not anticipated that the patient will be medically stable for discharge from the hospital within 2 midnights of admission. The following factors support the patient status of inpatient.   " Still requiring antibiotics and IV as well as oxygen * I certify that at the point of admission it is my clinical judgment that the patient will require inpatient hospital care spanning beyond 2 midnights from the point of admission due to high intensity of service, high risk for further deterioration and high frequency of surveillance required.*    Family Communication: Spoke with his wife Mattie on the phone and gave her update  Disposition Plan: Home when stable   Consultants:  None  Procedures:  None  Antimicrobials:  Antibiotics  DVT prophylaxis: Lovenox   Objective: Vitals:   02/03/19 0951 02/03/19  1451 02/03/19 2003 02/04/19 0625  BP: (!) 174/84 (!) 147/72 (!) 154/76 138/73  Pulse: 73 75 68 62  Resp:  18 (!) 24 20  Temp:  98.3 F (36.8 C) 98.1 F (36.7 C) 97.8 F (36.6 C)  TempSrc:  Oral Oral Oral  SpO2: 99% 96% 97% 95%  Weight:      Height:        Intake/Output Summary (Last 24 hours) at 02/04/2019 0913 Last data filed at 02/04/2019 4097 Gross per 24 hour  Intake 2236.55 ml  Output 1525 ml  Net 711.55 ml   Filed Weights   02/02/19 1209  Weight: 107 kg   Body mass index is 32.01 kg/m.  Exam:  . General: 86 y.o. year-old male well developed well nourished in no acute distress.  Alert and oriented x3. . Cardiovascular: Regular rate and rhythm with no rubs or gallops.  No thyromegaly or JVD noted.   Marland Kitchen Respiratory: Bilateral adventitious sounds to auscultation with no wheezes or rales. Good inspiratory effort. . Abdomen: Soft nontender nondistended with normal bowel sounds x4 quadrants. . Musculoskeletal: No lower extremity edema. 2/4 pulses in all 4 extremities. . Skin: No ulcerative lesions noted or rashes, . Psychiatry: Mood is appropriate for condition and setting    Data Reviewed: CBC: Recent Labs  Lab 02/02/19 1345 02/02/19 1910 02/03/19 0334 02/04/19 0348  WBC 5.4 5.1 4.5 6.7  NEUTROABS 4.2  --  3.7 5.9  HGB 13.1 13.7 14.2 12.8*  HCT 38.3* 41.0 42.8 38.0*  MCV 90.8 92.3 93.0 90.5  PLT 206 213 192 272   Basic Metabolic Panel: Recent Labs  Lab 02/02/19 1345 02/02/19 1910 02/03/19 0334 02/04/19 0348  NA 140  --  140 137  K 3.4*  --  4.7 4.3  CL 102  --  104 106  CO2 26  --  21* 22  GLUCOSE 133*  --  157* 225*  BUN 26*  --  25* 35*  CREATININE 1.47* 1.41* 1.26* 1.22  CALCIUM 8.5*  --  8.4* 8.4*  MG  --   --  1.9 1.8   GFR: Estimated Creatinine Clearance: 58 mL/min (by C-G formula based on SCr of 1.22 mg/dL). Liver Function Tests: Recent Labs  Lab 02/02/19 1345 02/03/19 0334 02/04/19 0348  AST 36 40 30  ALT 17 17 17   ALKPHOS 56 57  51  BILITOT 0.7 1.5* 0.7  PROT 7.5 7.2 6.8  ALBUMIN 3.1* 2.9* 2.7*   No results for input(s): LIPASE, AMYLASE in the last 168 hours. No results for input(s): AMMONIA in the last 168 hours. Coagulation Profile: No results for input(s): INR, PROTIME in the last 168 hours. Cardiac Enzymes: No results for input(s): CKTOTAL, CKMB, CKMBINDEX, TROPONINI in the last 168 hours. BNP (last 3 results) No results for input(s): PROBNP in the last 8760 hours. HbA1C: Recent Labs    02/02/19 1910  HGBA1C 7.0*   CBG: Recent Labs  Lab 02/03/19 0739 02/03/19 1223 02/03/19 1627 02/03/19 2000 02/04/19 0735  GLUCAP 172* 215* 244* 203* 191*   Lipid Profile: Recent Labs    02/02/19 1345  TRIG 107   Thyroid Function Tests: No results for input(s): TSH, T4TOTAL, FREET4, T3FREE, THYROIDAB in the last 72 hours. Anemia Panel: Recent Labs    02/03/19 0334 02/04/19 0348  FERRITIN 423* 440*   Urine analysis:    Component Value Date/Time  COLORURINE YELLOW 02/01/2018 1750   APPEARANCEUR CLEAR 02/01/2018 1750   LABSPEC 1.021 02/01/2018 1750   PHURINE 5.0 02/01/2018 1750   GLUCOSEU >=500 (A) 02/01/2018 1750   HGBUR NEGATIVE 02/01/2018 1750   BILIRUBINUR NEGATIVE 02/01/2018 1750   KETONESUR NEGATIVE 02/01/2018 1750   PROTEINUR 30 (A) 02/01/2018 1750   UROBILINOGEN 0.2 04/18/2010 0910   NITRITE NEGATIVE 02/01/2018 1750   LEUKOCYTESUR NEGATIVE 02/01/2018 1750   Sepsis Labs: @LABRCNTIP (procalcitonin:4,lacticidven:4)  ) Recent Results (from the past 240 hour(s))  Blood Culture (routine x 2)     Status: None (Preliminary result)   Collection Time: 02/02/19 12:35 PM   Specimen: BLOOD LEFT HAND  Result Value Ref Range Status   Specimen Description   Final    BLOOD LEFT HAND Performed at [facility], [address] [place], [place]    Special Requests   Final    BOTTLES DRAWN AEROBIC AND ANAEROBIC Blood Culture results may not be optimal due to an  inadequate volume of blood received in culture bottles Performed at [facility], [address] [place], [place]    Culture   Final    NO GROWTH < 24 HOURS Performed at [facility], [address] [place], [place]    Report Status PENDING  Incomplete  Blood Culture (routine x 2)     Status: None (Preliminary result)   Collection Time: 02/02/19 12:40 PM   Specimen: BLOOD  Result Value Ref Range Status   Specimen Description   Final    BLOOD LEFT ARM Performed at [facility], [address] [place], [place]    Special Requests   Final    BOTTLES DRAWN AEROBIC ONLY Blood Culture results may not be optimal due to an inadequate volume of blood received in culture bottles Performed at [facility], [address] [place], [place]    Culture   Final    NO GROWTH < 24 HOURS Performed at [facility], [address], [place], [place]    Report Status PENDING  Incomplete      Studies: No results found.  Scheduled Meds: . amLODipine  10 mg Oral Daily  . vitamin C  500 mg Oral Daily  . aspirin  81 mg Oral QODAY  . dexamethasone (DECADRON) injection  6 mg Intravenous Q24H  . enoxaparin (LOVENOX) injection  40 mg Subcutaneous Q12H  . insulin aspart  0-5 Units Subcutaneous QHS  . insulin aspart  0-9 Units Subcutaneous TID WC  . Ipratropium-Albuterol  1 puff Inhalation Q6H  . zinc sulfate  220 mg Oral Daily    Continuous Infusions:    LOS: 2 days     [id number], MD [facility]  To reach me or the doctor on call, go to: [email or web address] Password [facility]  02/04/2019, 9:13 AM

## 2019-02-05 LAB — CBC WITH DIFFERENTIAL/PLATELET
Abs Immature Granulocytes: 0.03 10*3/uL (ref 0.00–0.07)
Basophils Absolute: 0 10*3/uL (ref 0.0–0.1)
Basophils Relative: 0 %
Eosinophils Absolute: 0 10*3/uL (ref 0.0–0.5)
Eosinophils Relative: 0 %
HCT: 36.7 % — ABNORMAL LOW (ref 39.0–52.0)
Hemoglobin: 12.4 g/dL — ABNORMAL LOW (ref 13.0–17.0)
Immature Granulocytes: 0 %
Lymphocytes Relative: 7 %
Lymphs Abs: 0.5 10*3/uL — ABNORMAL LOW (ref 0.7–4.0)
MCH: 30.1 pg (ref 26.0–34.0)
MCHC: 33.8 g/dL (ref 30.0–36.0)
MCV: 89.1 fL (ref 80.0–100.0)
Monocytes Absolute: 0.2 10*3/uL (ref 0.1–1.0)
Monocytes Relative: 2 %
Neutro Abs: 6.8 10*3/uL (ref 1.7–7.7)
Neutrophils Relative %: 91 %
Platelets: 302 10*3/uL (ref 150–400)
RBC: 4.12 MIL/uL — ABNORMAL LOW (ref 4.22–5.81)
RDW: 12.5 % (ref 11.5–15.5)
WBC: 7.6 10*3/uL (ref 4.0–10.5)
nRBC: 0 % (ref 0.0–0.2)

## 2019-02-05 LAB — COMPREHENSIVE METABOLIC PANEL
ALT: 19 U/L (ref 0–44)
AST: 29 U/L (ref 15–41)
Albumin: 2.7 g/dL — ABNORMAL LOW (ref 3.5–5.0)
Alkaline Phosphatase: 51 U/L (ref 38–126)
Anion gap: 9 (ref 5–15)
BUN: 36 mg/dL — ABNORMAL HIGH (ref 8–23)
CO2: 24 mmol/L (ref 22–32)
Calcium: 8.5 mg/dL — ABNORMAL LOW (ref 8.9–10.3)
Chloride: 104 mmol/L (ref 98–111)
Creatinine, Ser: 1.33 mg/dL — ABNORMAL HIGH (ref 0.61–1.24)
GFR calc Af Amer: 57 mL/min — ABNORMAL LOW (ref >60)
GFR calc non Af Amer: 49 mL/min — ABNORMAL LOW (ref >60)
Glucose, Bld: 258 mg/dL — ABNORMAL HIGH (ref 70–99)
Potassium: 4.3 mmol/L (ref 3.5–5.1)
Sodium: 137 mmol/L (ref 135–145)
Total Bilirubin: 0.4 mg/dL (ref 0.3–1.2)
Total Protein: 6.8 g/dL (ref 6.5–8.1)

## 2019-02-05 LAB — SARS CORONAVIRUS 2 (TAT 6-24 HRS): SARS Coronavirus 2: POSITIVE — AB

## 2019-02-05 LAB — GLUCOSE, CAPILLARY
Glucose-Capillary: 221 mg/dL — ABNORMAL HIGH (ref 70–99)
Glucose-Capillary: 297 mg/dL — ABNORMAL HIGH (ref 70–99)
Glucose-Capillary: 222 mg/dL — ABNORMAL HIGH (ref 70–99)
Glucose-Capillary: 162 mg/dL — ABNORMAL HIGH (ref 70–99)

## 2019-02-05 LAB — MAGNESIUM: Magnesium: 1.8 mg/dL (ref 1.7–2.4)

## 2019-02-05 LAB — D-DIMER, QUANTITATIVE: D-Dimer, Quant: 0.42 ug/mL-FEU (ref 0.00–0.50)

## 2019-02-05 LAB — FERRITIN: Ferritin: 436 ng/mL — ABNORMAL HIGH (ref 24–336)

## 2019-02-05 LAB — C-REACTIVE PROTEIN: CRP: 7.4 mg/dL — ABNORMAL HIGH (ref <1.0)

## 2019-02-05 MED ORDER — HYDROCOD POLST-CPM POLST ER 10-8 MG/5ML PO SUER: 5.0000 mL | Freq: Two times a day (BID) | ORAL | Status: DC | PRN

## 2019-02-05 MED ORDER — ASCORBIC ACID 500 MG PO TABS: 500.0000 mg | ORAL_TABLET | Freq: Every day | ORAL | Status: DC

## 2019-02-05 MED ORDER — INSULIN ASPART 100 UNIT/ML ~~LOC~~ SOLN
0.0000 [IU] | Freq: Three times a day (TID) | SUBCUTANEOUS | Status: DC
  Administered 2019-02-05 – 2019-02-06 (×2): 5 [IU] via SUBCUTANEOUS
  Administered 2019-02-06: 3 [IU] via SUBCUTANEOUS
  Administered 2019-02-06 – 2019-02-07 (×2): 5 [IU] via SUBCUTANEOUS
  Administered 2019-02-07: 3 [IU] via SUBCUTANEOUS
  Administered 2019-02-08 (×2): 2 [IU] via SUBCUTANEOUS

## 2019-02-05 MED ORDER — ZINC SULFATE 220 (50 ZN) MG PO CAPS: 220.0000 mg | ORAL_CAPSULE | Freq: Every day | ORAL | Status: DC

## 2019-02-05 MED ORDER — ADULT MULTIVITAMIN W/MINERALS CH
1.0000 | ORAL_TABLET | Freq: Every day | ORAL | Status: DC
  Administered 2019-02-05 – 2019-02-08 (×4): 1 via ORAL
  Filled 2019-02-05 (×4): qty 1

## 2019-02-05 MED ORDER — SODIUM CHLORIDE 0.9 % IV SOLN
200.0000 mg | Freq: Once | INTRAVENOUS | Status: AC
  Administered 2019-02-05: 200 mg via INTRAVENOUS
  Filled 2019-02-05: qty 200

## 2019-02-05 MED ORDER — SODIUM CHLORIDE 0.9 % IV SOLN
100.0000 mg | Freq: Every day | INTRAVENOUS | Status: DC
  Administered 2019-02-06 – 2019-02-08 (×3): 100 mg via INTRAVENOUS
  Filled 2019-02-05 (×3): qty 20

## 2019-02-05 MED ORDER — DEXAMETHASONE SODIUM PHOSPHATE 10 MG/ML IJ SOLN
6.0000 mg | Freq: Every day | INTRAMUSCULAR | Status: DC
  Administered 2019-02-05 – 2019-02-08 (×4): 6 mg via INTRAVENOUS
  Filled 2019-02-05 (×4): qty 1

## 2019-02-05 MED ORDER — LINAGLIPTIN 5 MG PO TABS
5.0000 mg | ORAL_TABLET | Freq: Every day | ORAL | Status: DC
  Administered 2019-02-05 – 2019-02-08 (×4): 5 mg via ORAL
  Filled 2019-02-05 (×4): qty 1

## 2019-02-05 MED ORDER — IPRATROPIUM-ALBUTEROL 20-100 MCG/ACT IN AERS
1.0000 | INHALATION_SPRAY | Freq: Three times a day (TID) | RESPIRATORY_TRACT | Status: DC
  Administered 2019-02-05 – 2019-02-08 (×9): 1 via RESPIRATORY_TRACT
  Filled 2019-02-05: qty 4

## 2019-02-05 MED ORDER — INSULIN ASPART 100 UNIT/ML ~~LOC~~ SOLN
0.0000 [IU] | Freq: Every day | SUBCUTANEOUS | Status: DC
  Administered 2019-02-06 – 2019-02-07 (×2): 2 [IU] via SUBCUTANEOUS

## 2019-02-05 MED ORDER — GUAIFENESIN-DM 100-10 MG/5ML PO SYRP: 10.0000 mL | ORAL_SOLUTION | ORAL | Status: DC | PRN

## 2019-02-05 NOTE — Care Management Important Message (Signed)
Important Message  Patient Details IM Letter given to Cookie McGibboney to present to the Patient Name: Thomas Joseph MRN: [id number] Date of Birth: [birth year]   Medicare Important Message Given:  Yes     Caren Macadam 02/05/2019, 10:47 AM

## 2019-02-05 NOTE — Plan of Care (Signed)
  Problem: Clinical Measurements: Goal: Respiratory complications will improve Outcome: Progressing   Problem: Nutrition: Goal: Adequate nutrition will be maintained Outcome: Progressing   

## 2019-02-05 NOTE — Progress Notes (Signed)
CRITICAL VALUE ALERT  Critical Value:  COVID (+)  Date & Time Notied:  02/05/2019 1102  Provider Notified: Pola Corn  Orders Received/Actions taken: N/A

## 2019-02-05 NOTE — Progress Notes (Signed)
PROGRESS NOTE  Thomas Joseph  DOB: [birth year]  PCP: Mila Palmer, MD EVO:[id number]  DOA: 02/02/2019 Admitted From: Home  LOS: 3 days   Chief Complaint  Patient presents with  . Shortness of Breath   Brief narrative: Thomas Joseph is a 86 y.o. male with PMH significant for HTN, T2DM who lives at home with his wife and is physically active.   Patient presented to the ED on 1/22 with progressive shortness of breath, weakness, body aches, loss of taste/appetite.  Patient reports recently received initial dose of Covid-19 vaccination.  He also reports that his wife at home is positive for Covid-19.  In the ED, patient was afebrile, oxygen saturation 85% on room air requiring supplemental oxygen by nasal cannula. Blood work showed WBC count 5.4, BUN 26, creatinine 1.47 Covid PCR obtained on admission was negative however inflammatory markers were elevated D-dimer 1.30, LDH 243, ferritin 428, CRP 22.1, lactic acid 1.6, procalcitonin 0.16.   Chest x-ray with bilateral interstitial edema consistent with multifocal pneumonia.   Patient was admitted as PUI. A repeat Covid PCR was obtained on 1/24, resulted positive on 1/25.  Subjective: Patient was seen and examined this morning.  Pleasant elderly African-American male.  Lying down in bed.  Feels weak.  On 2 L oxygen by nasal cannula. Not in distress otherwise  Assessment/Plan: COVID pneumonia Acute respiratory failure with hypoxia - 2lpmO2 -Presented with fever, cough, shortness of breath, exposure to spouse who is Covid positive -Chest imaging -multifocal pneumonia -Treatment: Decadron 6 mg daily for 10 days, IV Remdesivir for 5 days to complete on 1/29 -Supportive care: Vitamin C, Zinc, inhalers, Tylenol, Antitussives - benzonatate, Mucinex -Oxygen - SpO2: 92 % O2 Flow Rate (L/min): 2 L/min -Continue airborne/contact isolation precautions. -WBC normal. Inflammatory markers trend as below.  Significantly improving D-dimer and  CRP level in last 3 days.  Lab Results  Component Value Date   SARSCOV2NAA POSITIVE (A) 02/04/2019    Recent Labs  Lab 02/02/19 1345 02/02/19 1910 02/03/19 0334 02/04/19 0348 02/05/19 0401  WBC 5.4 5.1 4.5 6.7 7.6   Recent Labs    02/03/19 0334 02/04/19 0348 02/05/19 0401  DDIMER 5.75* 0.69* 0.42  FERRITIN 423* 440* 436*  CRP 21.3* 13.5* 7.4*    Essential hypertension CKD stage III A -Home meds include amlodipine 10 mg p.o. daily and lisinopril-HCTZ 20-12.5 mg p.o. daily -Continue amlodipine -Hold lisinopril-HCTZ with a slight bump in creatinine.  Continue to monitor creatinine.  Seems to be on NSAIDs at home.  Advised to avoid. -Continue to monitor blood pressure closely  T2DM - A1c 7 -On Metformin XR 1000 mg p.o. daily at home. -Hold oral hypoglycemics while inpatient -Insulin sliding scale for coverage  Mobility: Encourage ambulation.  PT eval obtained.  No need of therapy outpatient Diet: Cardiac/diabetic diet Fluid: Not on IV fluid DVT prophylaxis:  Lovenox subcu Code Status:  Full code Family Communication:  Not at bedside Expected Discharge:  Continue inpatient management for now  Consultants:  None  Procedures:  None  Antimicrobials: Anti-infectives (From admission, onward)   Start     Dose/Rate Route Frequency Ordered Stop   02/06/19 1000  remdesivir 100 mg in sodium chloride 0.9 % 100 mL IVPB     100 mg 200 mL/hr over 30 Minutes Intravenous Daily 02/05/19 1108 02/10/19 0959   02/05/19 1115  remdesivir 200 mg in sodium chloride 0.9% 250 mL IVPB     200 mg 580 mL/hr over 30 Minutes Intravenous Once 02/05/19 1108  02/05/19 1241        Code Status: Full Code   Diet Order            Diet heart healthy/carb modified Room service appropriate? Yes; Fluid consistency: Thin  Diet effective now              Infusions:  . [START ON 02/06/2019] remdesivir 100 mg in NS 100 mL      Scheduled Meds: . amLODipine  10 mg Oral Daily  . vitamin C   500 mg Oral Daily  . aspirin  81 mg Oral QODAY  . dexamethasone (DECADRON) injection  6 mg Intravenous Daily  . enoxaparin (LOVENOX) injection  40 mg Subcutaneous Q12H  . insulin aspart  0-15 Units Subcutaneous TID WC  . insulin aspart  0-5 Units Subcutaneous QHS  . Ipratropium-Albuterol  1 puff Inhalation TID  . linagliptin  5 mg Oral Daily  . multivitamin with minerals  1 tablet Oral Daily  . zinc sulfate  220 mg Oral Daily    PRN meds: acetaminophen **OR** acetaminophen, chlorpheniramine-HYDROcodone, guaiFENesin-dextromethorphan, guaiFENesin-dextromethorphan, ondansetron **OR** ondansetron (ZOFRAN) IV, senna-docusate   Objective: Vitals:   02/05/19 0921 02/05/19 1340  BP: (!) 155/85 (!) 142/87  Pulse: 71 69  Resp: 16 18  Temp:  97.9 F (36.6 C)  SpO2: 93% 92%    Intake/Output Summary (Last 24 hours) at 02/05/2019 1423 Last data filed at 02/05/2019 1200 Gross per 24 hour  Intake 600 ml  Output 1425 ml  Net -825 ml   Filed Weights   02/02/19 1209  Weight: 107 kg   Weight change:  Body mass index is 32.01 kg/m.   Physical Exam: General exam: Appears calm and comfortable.  Feels tired Skin: No rashes, lesions or ulcers. HEENT: Atraumatic, normocephalic, supple neck, no obvious bleeding Lungs: Clear to auscultation bilaterally CVS: Regular rate and rhythm, no murmur GI/Abd soft, nontender, nondistended, bowel sound present CNS: Alert, awake, oriented x3 Psychiatry: Mood appropriate Extremities: No pedal edema, no calf tenderness  Data Review: I have personally reviewed the laboratory data and studies available.  Recent Labs  Lab 02/02/19 1345 02/02/19 1910 02/03/19 0334 02/04/19 0348 02/05/19 0401  WBC 5.4 5.1 4.5 6.7 7.6  NEUTROABS 4.2  --  3.7 5.9 6.8  HGB 13.1 13.7 14.2 12.8* 12.4*  HCT 38.3* 41.0 42.8 38.0* 36.7*  MCV 90.8 92.3 93.0 90.5 89.1  PLT 206 213 192 272 302   Recent Labs  Lab 02/02/19 1345 02/02/19 1910 02/03/19 0334 02/04/19 0348  02/05/19 0401  NA 140  --  140 137 137  K 3.4*  --  4.7 4.3 4.3  CL 102  --  104 106 104  CO2 26  --  21* 22 24  GLUCOSE 133*  --  157* 225* 258*  BUN 26*  --  25* 35* 36*  CREATININE 1.47* 1.41* 1.26* 1.22 1.33*  CALCIUM 8.5*  --  8.4* 8.4* 8.5*  MG  --   --  1.9 1.8 1.8    Terrilee Croak, MD  [facility] 02/05/2019

## 2019-02-06 LAB — COMPREHENSIVE METABOLIC PANEL
ALT: 22 U/L (ref 0–44)
AST: 32 U/L (ref 15–41)
Albumin: 2.7 g/dL — ABNORMAL LOW (ref 3.5–5.0)
Alkaline Phosphatase: 46 U/L (ref 38–126)
Anion gap: 9 (ref 5–15)
BUN: 33 mg/dL — ABNORMAL HIGH (ref 8–23)
CO2: 24 mmol/L (ref 22–32)
Calcium: 8.7 mg/dL — ABNORMAL LOW (ref 8.9–10.3)
Chloride: 104 mmol/L (ref 98–111)
Creatinine, Ser: 1.17 mg/dL (ref 0.61–1.24)
GFR calc Af Amer: >60 mL/min (ref >60)
GFR calc non Af Amer: 57 mL/min — ABNORMAL LOW (ref >60)
Glucose, Bld: 203 mg/dL — ABNORMAL HIGH (ref 70–99)
Potassium: 4.3 mmol/L (ref 3.5–5.1)
Sodium: 137 mmol/L (ref 135–145)
Total Bilirubin: 0.8 mg/dL (ref 0.3–1.2)
Total Protein: 6.6 g/dL (ref 6.5–8.1)

## 2019-02-06 LAB — GLUCOSE, CAPILLARY
Glucose-Capillary: 190 mg/dL — ABNORMAL HIGH (ref 70–99)
Glucose-Capillary: 208 mg/dL — ABNORMAL HIGH (ref 70–99)
Glucose-Capillary: 231 mg/dL — ABNORMAL HIGH (ref 70–99)
Glucose-Capillary: 203 mg/dL — ABNORMAL HIGH (ref 70–99)

## 2019-02-06 LAB — CBC WITH DIFFERENTIAL/PLATELET
Abs Immature Granulocytes: 0.11 10*3/uL — ABNORMAL HIGH (ref 0.00–0.07)
Basophils Absolute: 0 10*3/uL (ref 0.0–0.1)
Basophils Relative: 0 %
Eosinophils Absolute: 0 10*3/uL (ref 0.0–0.5)
Eosinophils Relative: 0 %
HCT: 36.7 % — ABNORMAL LOW (ref 39.0–52.0)
Hemoglobin: 12.4 g/dL — ABNORMAL LOW (ref 13.0–17.0)
Immature Granulocytes: 1 %
Lymphocytes Relative: 7 %
Lymphs Abs: 0.5 10*3/uL — ABNORMAL LOW (ref 0.7–4.0)
MCH: 30.2 pg (ref 26.0–34.0)
MCHC: 33.8 g/dL (ref 30.0–36.0)
MCV: 89.3 fL (ref 80.0–100.0)
Monocytes Absolute: 0.5 10*3/uL (ref 0.1–1.0)
Monocytes Relative: 6 %
Neutro Abs: 6.8 10*3/uL (ref 1.7–7.7)
Neutrophils Relative %: 86 %
Platelets: 288 10*3/uL (ref 150–400)
RBC: 4.11 MIL/uL — ABNORMAL LOW (ref 4.22–5.81)
RDW: 12.3 % (ref 11.5–15.5)
WBC: 8 10*3/uL (ref 4.0–10.5)
nRBC: 0 % (ref 0.0–0.2)

## 2019-02-06 LAB — D-DIMER, QUANTITATIVE: D-Dimer, Quant: 0.52 ug/mL-FEU — ABNORMAL HIGH (ref 0.00–0.50)

## 2019-02-06 LAB — C-REACTIVE PROTEIN: CRP: 4.3 mg/dL — ABNORMAL HIGH (ref <1.0)

## 2019-02-06 LAB — FERRITIN: Ferritin: 419 ng/mL — ABNORMAL HIGH (ref 24–336)

## 2019-02-06 LAB — MAGNESIUM: Magnesium: 1.8 mg/dL (ref 1.7–2.4)

## 2019-02-06 NOTE — Plan of Care (Signed)
  Problem: Nutrition: Goal: Adequate nutrition will be maintained Outcome: Progressing   

## 2019-02-06 NOTE — Progress Notes (Addendum)
PROGRESS NOTE    Thomas Joseph   WYO:[id number]  DOB: Jun 16, 1933  DOA: 02/02/2019 PCP: Mila Palmer, MD   Brief Narrative:  Thomas Joseph is a 86 y.o.malewith PMH significant for HTN, T2DM who lives at home with his wife and is physically active.   Patient presented to the ED on 1/22 with progressive shortness of breath, weakness, body aches, loss of taste/appetite. Patient reports recently received initial dose of Covid-19 vaccination. He also reports that his wife at home is positive for Covid-19.  In the ED, patient was afebrile, oxygen saturation 85% on room air requiring supplemental oxygen by nasal cannula. Blood work showed WBC count 5.4, BUN 26, creatinine 1.47 Covid PCR obtained on admission was negative however inflammatory markers were elevated D-dimer 1.30, LDH 243, ferritin 428, CRP 22.1, lactic acid 1.6, procalcitonin 0.16.  Chest x-ray with bilateral interstitial edema consistent with multifocal pneumonia.  Patient was admitted as PUI. A repeat Covid PCR was obtained on 1/24, resulted positive on 1/25.   Subjective: He feels tired and has a mild cough. Appetite is better today.     Assessment & Plan:   Principal Problem:   Acute respiratory failure with hypoxia with COVID 19 pneumonia - initial COVID swab was not sent to lab- rechecked on 1/25 and found to be + - admits to fever, dyspnea on exertion and cough. His spouse has COVID as well - CXR > multifocal pneumonia - cont Decadron and Remdesivir- Remdesivir started on 1/25 - last dose will be 1/29- inflammatory markers improving - cont Vit C, Zinc and inhalers - he is still requiring 2 L O2 today    Active Problems: AKI- dehydration - Cr 1.47 on admission- improved to normal- HCTZ being held    Essential hypertension - cont Amlodipine- hold Lisinopril and HCTZ    Type 2 diabetes mellitus with complication, without long-term current use of insulin - A1c is 7.0 - Metformin on hold- cont  SSI and Tradjenta      Hypokalemia- 3.4 - ? Due to HCTZ- improved  Anemia of chronic disease - Hb 12-13 range- follow  Time spent in minutes: 35 DVT prophylaxis: Lovenox Code Status: Full code Family Communication:  Disposition Plan: home- no PT needs Consultants:   none Procedures:   none Antimicrobials:  Anti-infectives (From admission, onward)   Start     Dose/Rate Route Frequency Ordered Stop   02/06/19 1000  remdesivir 100 mg in sodium chloride 0.9 % 100 mL IVPB     100 mg 200 mL/hr over 30 Minutes Intravenous Daily 02/05/19 1108 02/10/19 0959   02/05/19 1115  remdesivir 200 mg in sodium chloride 0.9% 250 mL IVPB     200 mg 580 mL/hr over 30 Minutes Intravenous Once 02/05/19 1108 02/05/19 1241       Objective: Vitals:   02/05/19 0921 02/05/19 1340 02/05/19 2049 02/06/19 0510  BP: (!) 155/85 (!) 142/87 (!) 149/82 (!) 147/82  Pulse: 71 69 60 61  Resp: 16 18 20 20   Temp:  97.9 F (36.6 C) 98 F (36.7 C) 97.9 F (36.6 C)  TempSrc:  Oral Oral Oral  SpO2: 93% 92% 99% 100%  Weight:      Height:        Intake/Output Summary (Last 24 hours) at 02/06/2019 1152 Last data filed at 02/06/2019 02/08/2019 Gross per 24 hour  Intake 180 ml  Output 1275 ml  Net -1095 ml   Filed Weights   02/02/19 1209  Weight: 107 kg  Examination: General exam: Appears comfortable  HEENT: PERRLA, oral mucosa moist, no sclera icterus or thrush Respiratory system: Clear to auscultation. Respiratory effort normal. Cardiovascular system: S1 & S2 heard, RRR.   Gastrointestinal system: Abdomen soft, non-tender, nondistended. Normal bowel sounds. Central nervous system: Alert and oriented. No focal neurological deficits. Extremities: No cyanosis, clubbing or edema Skin: No rashes or ulcers Psychiatry:  Mood & affect appropriate.     Data Reviewed: I have personally reviewed following labs and imaging studies  CBC: Recent Labs  Lab 02/02/19 1345 02/02/19 1345 02/02/19 1910  02/03/19 0334 02/04/19 0348 02/05/19 0401 02/06/19 0356  WBC 5.4   < > 5.1 4.5 6.7 7.6 8.0  NEUTROABS 4.2  --   --  3.7 5.9 6.8 6.8  HGB 13.1   < > 13.7 14.2 12.8* 12.4* 12.4*  HCT 38.3*   < > 41.0 42.8 38.0* 36.7* 36.7*  MCV 90.8   < > 92.3 93.0 90.5 89.1 89.3  PLT 206   < > 213 192 272 302 288   < > = values in this interval not displayed.   Basic Metabolic Panel: Recent Labs  Lab 02/02/19 1345 02/02/19 1345 02/02/19 1910 02/03/19 0334 02/04/19 0348 02/05/19 0401 02/06/19 0356  NA 140  --   --  140 137 137 137  K 3.4*  --   --  4.7 4.3 4.3 4.3  CL 102  --   --  104 106 104 104  CO2 26  --   --  21* 22 24 24   GLUCOSE 133*  --   --  157* 225* 258* 203*  BUN 26*  --   --  25* 35* 36* 33*  CREATININE 1.47*   < > 1.41* 1.26* 1.22 1.33* 1.17  CALCIUM 8.5*  --   --  8.4* 8.4* 8.5* 8.7*  MG  --   --   --  1.9 1.8 1.8 1.8   < > = values in this interval not displayed.   GFR: Estimated Creatinine Clearance: 60.5 mL/min (by C-G formula based on SCr of 1.17 mg/dL). Liver Function Tests: Recent Labs  Lab 02/02/19 1345 02/03/19 0334 02/04/19 0348 02/05/19 0401 02/06/19 0356  AST 36 40 30 29 32  ALT 17 17 17 19 22   ALKPHOS 56 57 51 51 46  BILITOT 0.7 1.5* 0.7 0.4 0.8  PROT 7.5 7.2 6.8 6.8 6.6  ALBUMIN 3.1* 2.9* 2.7* 2.7* 2.7*   No results for input(s): LIPASE, AMYLASE in the last 168 hours. No results for input(s): AMMONIA in the last 168 hours. Coagulation Profile: No results for input(s): INR, PROTIME in the last 168 hours. Cardiac Enzymes: No results for input(s): CKTOTAL, CKMB, CKMBINDEX, TROPONINI in the last 168 hours. BNP (last 3 results) No results for input(s): PROBNP in the last 8760 hours. HbA1C: No results for input(s): HGBA1C in the last 72 hours. CBG: Recent Labs  Lab 02/05/19 1156 02/05/19 1729 02/05/19 2046 02/06/19 0717 02/06/19 1135  GLUCAP 297* 222* 162* 190* 208*   Lipid Profile: No results for input(s): CHOL, HDL, LDLCALC, TRIG, CHOLHDL,  LDLDIRECT in the last 72 hours. Thyroid Function Tests: No results for input(s): TSH, T4TOTAL, FREET4, T3FREE, THYROIDAB in the last 72 hours. Anemia Panel: Recent Labs    02/05/19 0401 02/06/19 0356  FERRITIN 436* 419*   Urine analysis:    Component Value Date/Time   COLORURINE YELLOW 02/01/2018 1750   APPEARANCEUR CLEAR 02/01/2018 1750   LABSPEC 1.021 02/01/2018 1750   PHURINE 5.0 02/01/2018  [place] >=500 (A) 02/01/2018 1750   HGBUR NEGATIVE 02/01/2018 1750   BILIRUBINUR NEGATIVE 02/01/2018 1750   KETONESUR NEGATIVE 02/01/2018 1750   PROTEINUR 30 (A) 02/01/2018 1750   UROBILINOGEN 0.2 04/18/2010 0910   NITRITE NEGATIVE 02/01/2018 1750   LEUKOCYTESUR NEGATIVE 02/01/2018 1750   Sepsis Labs: @LABRCNTIP (procalcitonin:4,lacticidven:4) ) Recent Results (from the past 240 hour(s))  Blood Culture (routine x 2)     Status: None (Preliminary result)   Collection Time: 02/02/19 12:35 PM   Specimen: BLOOD LEFT HAND  Result Value Ref Range Status   Specimen Description   Final    BLOOD LEFT HAND Performed at [facility], [place] [address] [place], [place] [address]    Special Requests   Final    BOTTLES DRAWN AEROBIC AND ANAEROBIC Blood Culture results may not be optimal due to an inadequate volume of blood received in culture bottles Performed at [place] [address] [place], [place] [address]    Culture   Final    NO GROWTH 4 DAYS Performed at [facility], [place] [address], [place], [place] [address]    Report Status PENDING  Incomplete  Blood Culture (routine x 2)     Status: None (Preliminary result)   Collection Time: 02/02/19 12:40 PM   Specimen: BLOOD  Result Value Ref Range Status   Specimen Description   Final    BLOOD LEFT ARM Performed at [place] [address], [place], [place] [address]    Special Requests   Final    BOTTLES DRAWN AEROBIC ONLY Blood Culture results may not be  optimal due to an inadequate volume of blood received in culture bottles Performed at [place] [address], [place], [place] [address]    Culture   Final    NO GROWTH 4 DAYS Performed at [facility], [place] [address] [place], [place] [address]    Report Status PENDING  Incomplete  SARS CORONAVIRUS 2 (TAT 6-24 HRS) Nasopharyngeal Nasopharyngeal Swab     Status: Abnormal   Collection Time: 02/04/19  5:18 PM   Specimen: Nasopharyngeal Swab  Result Value Ref Range Status   SARS Coronavirus 2 POSITIVE (A) NEGATIVE Final    Comment: RESULT CALLED TO, READ BACK BY AND VERIFIED WITH: Marrian Salvage RN 11:05 02/05/19 ([email or web address]) (NOTE) SARS-CoV-2 target nucleic acids are DETECTED. The SARS-CoV-2 RNA is generally detectable in upper and lower respiratory specimens during the acute phase of infection. Positive results are indicative of the presence of SARS-CoV-2 RNA. Clinical correlation with patient history and other diagnostic information is  necessary to determine patient infection status. Positive results do not rule out bacterial infection or co-infection with other viruses.  The expected result is Negative. Fact Sheet for Patients: SugarRoll.be Fact Sheet for Healthcare Providers: https://www.woods-mathews.com/ This test is not yet approved or cleared by the [place] FDA and  has been authorized for detection and/or diagnosis of SARS-CoV-2 by FDA under an Emergency Use Authorization (EUA). This EUA will remain  in effect (meaning this test can be used) for th e duration of the COVID-19 declaration under Section 564(b)(1) of the Act, 21 U.S.C. section 360bbb-3(b)(1), unless the authorization is terminated or revoked sooner. Performed at [facility], [place] [address], [place], [place] [address]          Radiology Studies: No results found.    Scheduled Meds: . amLODipine  10 mg Oral Daily  .  vitamin  C  500 mg Oral Daily  . aspirin  81 mg Oral QODAY  . dexamethasone (DECADRON) injection  6 mg Intravenous Daily  . enoxaparin (LOVENOX) injection  40 mg Subcutaneous Q12H  . insulin aspart  0-15 Units Subcutaneous TID WC  . insulin aspart  0-5 Units Subcutaneous QHS  . Ipratropium-Albuterol  1 puff Inhalation TID  . linagliptin  5 mg Oral Daily  . multivitamin with minerals  1 tablet Oral Daily  . zinc sulfate  220 mg Oral Daily   Continuous Infusions: . remdesivir 100 mg in NS 100 mL 100 mg (02/06/19 1012)     LOS: 4 days      Calvert Cantor, MD [facility] Pager: [email or web address] Password [facility] 02/06/2019, 11:52 AM

## 2019-02-06 NOTE — Progress Notes (Signed)
Inpatient Diabetes Program Recommendations  AACE/ADA: New Consensus Statement on Inpatient Glycemic Control (2015)  Target Ranges:  Prepandial:   less than 140 mg/dL      Peak postprandial:   less than 180 mg/dL (1-2 hours)      Critically ill patients:  140 - 180 mg/dL   Lab Results  Component Value Date   GLUCAP 190 (H) 02/06/2019   HGBA1C 7.0 (H) 02/02/2019    Review of Glycemic Control Results for [place], SHELLHAMMER (MRN [id number]) as of 02/06/2019 09:51  Ref. Range 02/05/2019 08:03 02/05/2019 11:56 02/05/2019 17:29 02/05/2019 20:46 02/06/2019 07:17  Glucose-Capillary Latest Ref Range: 70 - 99 mg/dL 146 (H) 431 (H) 427 (H) 162 (H) 190 (H)   Diabetes history: DM 2 Outpatient Diabetes medications: Metformin 1000 mg Daily Current orders for Inpatient glycemic control:  Novolog 0-15 units tid + hs Tradjenta 5 mg Daily  Decadron 6 mg Q24 hours BUN/Creat: 33/1.17  Inpatient Diabetes Program Recommendations:    Consider increasing Novolog to 0-20 units tid resistant while on steroids.  Thanks,  Christena Deem RN, MSN, BC-ADM Inpatient Diabetes Coordinator Team Pager [phone] (8a-5p)

## 2019-02-06 NOTE — Progress Notes (Signed)
SATURATION QUALIFICATIONS: (This note is used to comply with regulatory documentation for home oxygen)  Patient Saturations on Room Air at Rest = 100%  Patient Saturations on Room Air while Ambulating = 96%  Patient Saturations on 2 Liters of oxygen while Ambulating = 98%  Please briefly explain why patient needs home oxygen:patient does not require oxygen at this time.   Patient walked in his room for about 20 minutes on room air and his o2 sat remained no lower than 92% but stayed in upper 90's the entire time. Patient was left on room air while sitting in his chair, will continue to monitor saturations closely.

## 2019-02-06 NOTE — Progress Notes (Signed)
Have removed 02 2L  to trial sats. Currently 94% on room air.

## 2019-02-06 NOTE — Progress Notes (Signed)
Physical Therapy Treatment Patient Details Name: Thomas Joseph MRN: [id number] DOB: July 02, 1933 Today's Date: 02/06/2019    History of Present Illness Pt is 86 y.o. male with medical history significant of essential hypertension and type 2 diabetes mellitus who presented to the ED via EMS for progressive shortness of breath.  He had initial COVID vaccine and wife is at home + COVID 19.  Pt admitted with resp failure with hypoxia with suspected COVID    PT Comments    Patient progress gait and therapeutic exercises today in therapy session. Patient on RA at start of session and resting at 90%, dropped to 87-88% with ambulation and 2L/min donned via nasal cannula. Pt's SpO2 improved to 95-96% with mobility. He continues to require min guard for gait with SPC. Patient instructed on functional LE strengthening with LAQ and repeated sit<>stands, he required rest break after 5 reps due to fatigue/SOB. Patient also challenged today with balance activities in SLS, pt improved with repetition and intermittent UE support on counter. Pateint will continue to benefit from skilled PT interventions to address impairments. Acute PT will progress as able.   Follow Up Recommendations  No PT follow up     Equipment Recommendations  None recommended by PT    Recommendations for Other Services       Precautions / Restrictions Precautions Precautions: Fall Restrictions Weight Bearing Restrictions: No    Mobility  Bed Mobility               General bed mobility comments: Pt  OOB at start of session and ended in recliner  Transfers Overall transfer level: Needs assistance Equipment used: None Transfers: Sit to/from Stand Sit to Stand: Min guard         General transfer comment: pt with increased time/effort to initiate power up, assist to give pt SPC in standing. guarding for safety.  Ambulation/Gait Ambulation/Gait assistance: Min guard Gait Distance (Feet): 60 Feet(laps in hospital  room) Assistive device: Straight cane Gait Pattern/deviations: Decreased stride length;Shuffle;Narrow base of support Gait velocity: decreased   General Gait Details: assist for line management with pulse ox monitor and Oxygen. pt on RA initially and noted to be resting at 90%, SpO2 dropped to 87-88% on RA with ambnulattion. 2L/min donned and pt SpO2 improved to 95-96% with ambulation.   Stairs             Wheelchair Mobility    Modified Rankin (Stroke Patients Only)       Balance Overall balance assessment: Needs assistance Sitting-balance support: No upper extremity supported;Feet supported Sitting balance-Leahy Scale: Good     Standing balance support: Single extremity supported;During functional activity Standing balance-Leahy Scale: Fair Standing balance comment: Balance activities performed for balance training. Pt unable to maintain SLS on bil LE without external support. Pt performed 3x SLS on bil LE at counter with bil UE support and intermittently raising hand off counter to challenge balance. min guard/assist required for safety.           Cognition Arousal/Alertness: Awake/alert Behavior During Therapy: WFL for tasks assessed/performed Overall Cognitive Status: Within Functional Limits for tasks assessed         Exercises General Exercises - Lower Extremity Long Arc Quad: AROM;10 reps;Seated;Both Mini-Sqauts: Strengthening;10 reps;Both(2x5 reps Sit<>Stand from EOB; no UE use for power up, elevated EOB.)    General Comments        Pertinent Vitals/Pain Pain Assessment: No/denies pain           PT Goals (current  goals can now be found in the care plan section) Acute Rehab PT Goals Patient Stated Goal: return home; doesn't feel he will need HH therapy PT Goal Formulation: With patient Time For Goal Achievement: 02/17/19 Potential to Achieve Goals: Good Progress towards PT goals: Progressing toward goals    Frequency    Min 3X/week       PT Plan Current plan remains appropriate       AM-PAC PT "6 Clicks" Mobility   Outcome Measure  Help needed turning from your back to your side while in a flat bed without using bedrails?: A Little Help needed moving from lying on your back to sitting on the side of a flat bed without using bedrails?: None Help needed moving to and from a bed to a chair (including a wheelchair)?: A Little Help needed standing up from a chair using your arms (e.g., wheelchair or bedside chair)?: A Little Help needed to walk in hospital room?: A Little Help needed climbing 3-5 steps with a railing? : A Little 6 Click Score: 19    End of Session Equipment Utilized During Treatment: Gait belt;Oxygen Activity Tolerance: Patient tolerated treatment well Patient left: in chair;with call bell/phone within reach Nurse Communication: Mobility status PT Visit Diagnosis: Unsteadiness on feet (R26.81)     Time: 6546-5035 PT Time Calculation (min) (ACUTE ONLY): 27 min  Charges:  $Gait Training: 8-22 mins $Therapeutic Exercise: 8-22 mins                    Wynn Maudlin, DPT Physical Therapist with [facility] [phone]  02/06/2019 5:28 PM

## 2019-02-07 DIAGNOSIS — E118 Type 2 diabetes mellitus with unspecified complications: Secondary | ICD-10-CM

## 2019-02-07 DIAGNOSIS — I10 Essential (primary) hypertension: Secondary | ICD-10-CM

## 2019-02-07 DIAGNOSIS — Z20822 Contact with and (suspected) exposure to covid-19: Secondary | ICD-10-CM

## 2019-02-07 LAB — CBC WITH DIFFERENTIAL/PLATELET
Abs Immature Granulocytes: 0.26 10*3/uL — ABNORMAL HIGH (ref 0.00–0.07)
Basophils Absolute: 0 10*3/uL (ref 0.0–0.1)
Basophils Relative: 0 %
Eosinophils Absolute: 0 10*3/uL (ref 0.0–0.5)
Eosinophils Relative: 0 %
HCT: 37.2 % — ABNORMAL LOW (ref 39.0–52.0)
Hemoglobin: 12.7 g/dL — ABNORMAL LOW (ref 13.0–17.0)
Immature Granulocytes: 3 %
Lymphocytes Relative: 8 %
Lymphs Abs: 0.8 10*3/uL (ref 0.7–4.0)
MCH: 30.6 pg (ref 26.0–34.0)
MCHC: 34.1 g/dL (ref 30.0–36.0)
MCV: 89.6 fL (ref 80.0–100.0)
Monocytes Absolute: 1 10*3/uL (ref 0.1–1.0)
Monocytes Relative: 11 %
Neutro Abs: 6.9 10*3/uL (ref 1.7–7.7)
Neutrophils Relative %: 78 %
Platelets: 288 10*3/uL (ref 150–400)
RBC: 4.15 MIL/uL — ABNORMAL LOW (ref 4.22–5.81)
RDW: 12.2 % (ref 11.5–15.5)
WBC: 9 10*3/uL (ref 4.0–10.5)
nRBC: 0 % (ref 0.0–0.2)

## 2019-02-07 LAB — COMPREHENSIVE METABOLIC PANEL
ALT: 25 U/L (ref 0–44)
AST: 28 U/L (ref 15–41)
Albumin: 2.7 g/dL — ABNORMAL LOW (ref 3.5–5.0)
Alkaline Phosphatase: 45 U/L (ref 38–126)
Anion gap: 7 (ref 5–15)
BUN: 33 mg/dL — ABNORMAL HIGH (ref 8–23)
CO2: 27 mmol/L (ref 22–32)
Calcium: 8.6 mg/dL — ABNORMAL LOW (ref 8.9–10.3)
Chloride: 103 mmol/L (ref 98–111)
Creatinine, Ser: 1.04 mg/dL (ref 0.61–1.24)
GFR calc Af Amer: >60 mL/min (ref >60)
GFR calc non Af Amer: >60 mL/min (ref >60)
Glucose, Bld: 144 mg/dL — ABNORMAL HIGH (ref 70–99)
Potassium: 4 mmol/L (ref 3.5–5.1)
Sodium: 137 mmol/L (ref 135–145)
Total Bilirubin: 0.7 mg/dL (ref 0.3–1.2)
Total Protein: 6.2 g/dL — ABNORMAL LOW (ref 6.5–8.1)

## 2019-02-07 LAB — CULTURE, BLOOD (ROUTINE X 2)
Culture: NO GROWTH
Culture: NO GROWTH

## 2019-02-07 LAB — FERRITIN: Ferritin: 437 ng/mL — ABNORMAL HIGH (ref 24–336)

## 2019-02-07 LAB — GLUCOSE, CAPILLARY
Glucose-Capillary: 111 mg/dL — ABNORMAL HIGH (ref 70–99)
Glucose-Capillary: 249 mg/dL — ABNORMAL HIGH (ref 70–99)
Glucose-Capillary: 195 mg/dL — ABNORMAL HIGH (ref 70–99)
Glucose-Capillary: 249 mg/dL — ABNORMAL HIGH (ref 70–99)

## 2019-02-07 LAB — MAGNESIUM: Magnesium: 2 mg/dL (ref 1.7–2.4)

## 2019-02-07 LAB — D-DIMER, QUANTITATIVE: D-Dimer, Quant: 0.54 ug/mL-FEU — ABNORMAL HIGH (ref 0.00–0.50)

## 2019-02-07 LAB — C-REACTIVE PROTEIN: CRP: 2.7 mg/dL — ABNORMAL HIGH (ref <1.0)

## 2019-02-07 MED ORDER — LACTULOSE 10 GM/15ML PO SOLN
20.0000 g | Freq: Every day | ORAL | Status: DC | PRN
  Administered 2019-02-07: 20 g via ORAL
  Filled 2019-02-07: qty 30

## 2019-02-07 NOTE — Progress Notes (Signed)
PROGRESS NOTE    Thomas Joseph  DPO:[id number] DOB: 1933-08-31 DOA: 02/02/2019 PCP: Jonathon Jordan, MD    Brief Narrative:  86 y.o.malewithPMH significant for HTN,T2DMwho lives at home with his wife and is physically active.  Patient presented to the ED on 1/22 with progressive shortness of breath, weakness, body aches,loss of taste/appetite. Patient reports recently received initial dose of Covid-19 vaccination. He also reports that his wife at home is positive for Covid-19. In the ED, patient was afebrile, oxygen saturation 85% on room air requiring supplemental oxygen by nasal cannula. Blood work showed WBC count 5.4,BUN 26, creatinine 1.47 Covid PCR obtained on admission was negative however inflammatory markers were elevated D-dimer 1.30, LDH 243, ferritin 428, CRP 22.1, lactic acid 1.6, procalcitonin 0.16.  Chest x-ray with bilateral interstitial edema consistent with multifocal pneumonia. Patient was admitted as PUI. A repeat Covid PCR was obtained on 1/24, resulted positive on 1/25.  Assessment & Plan:   Principal Problem:   Acute respiratory failure with hypoxia (HCC) Active Problems:   Essential hypertension   Type 2 diabetes mellitus with complication, without long-term current use of insulin (HCC)   Hypokalemia  Principal Problem:   Acute respiratory failure with hypoxia with COVID 19 pneumonia - initial COVID swab was not sent to lab- rechecked on 1/25 and found to be + - admits to fever, dyspnea on exertion and cough. His spouse has COVID as well - CXR > multifocal pneumonia - cont Decadron and Remdesivir- Remdesivir started on 1/25 - last dose will be 1/29- inflammatory markers improving - cont Vit C, Zinc and inhalers - Weaned to room air at this time, did complain of increased sob on ambulation  Active Problems: AKI- dehydration - Cr 1.47 on admission- improved to normal- HCTZ being held -Renal function improved    Essential  hypertension -BP stable at this time - cont Amlodipine- hold Lisinopril and HCTZ    Type 2 diabetes mellitus with complication, without long-term current use of insulin - A1c is 7.0 - Metformin currently on hold- cont SSI and Tradjenta      Hypokalemia- 3.4 - Likely secondary to HCTZ- improved  Anemia of chronic disease - Hb 12-13 range -Stable. Repeat CBC in AM  DVT prophylaxis: Lovenox subq Code Status: Full Family Communication: Pt in room, family not at bedside Disposition Plan: Uncertain at this time  Consultants:     Procedures:     Antimicrobials: Anti-infectives (From admission, onward)   Start     Dose/Rate Route Frequency Ordered Stop   02/06/19 1000  remdesivir 100 mg in sodium chloride 0.9 % 100 mL IVPB     100 mg 200 mL/hr over 30 Minutes Intravenous Daily 02/05/19 1108 02/10/19 0959   02/05/19 1115  remdesivir 200 mg in sodium chloride 0.9% 250 mL IVPB     200 mg 580 mL/hr over 30 Minutes Intravenous Once 02/05/19 1108 02/05/19 1241       Subjective: Reports feeling better, however did state feeling sob on minimal to mod exertion  Objective: Vitals:   02/06/19 1324 02/06/19 2052 02/07/19 0555 02/07/19 1500  BP:  (!) 150/93 (!) 162/90 128/67  Pulse:  64 (!) 55 61  Resp:      Temp: 97.6 F (36.4 C) 97.7 F (36.5 C) 97.8 F (36.6 C) 97.7 F (36.5 C)  TempSrc: Oral Oral Oral Oral  SpO2:  97% 96% 95%  Weight:      Height:        Intake/Output Summary (Last 24  hours) at 02/07/2019 1758 Last data filed at 02/07/2019 1732 Gross per 24 hour  Intake 220 ml  Output 1525 ml  Net -1305 ml   Filed Weights   02/02/19 1209  Weight: 107 kg    Examination:  General exam: Appears calm and comfortable  Respiratory system: Clear to auscultation. Respiratory effort normal. Cardiovascular system: S1 & S2 heard, Regular Gastrointestinal system: Abdomen is nondistended, soft and nontender. No organomegaly or masses felt. Normal bowel sounds  heard. Central nervous system: Alert and oriented. No focal neurological deficits. Extremities: Symmetric 5 x 5 power. Skin: No rashes, lesions  Psychiatry: Judgement and insight appear normal. Mood & affect appropriate.   Data Reviewed: I have personally reviewed following labs and imaging studies  CBC: Recent Labs  Lab 02/03/19 0334 02/04/19 0348 02/05/19 0401 02/06/19 0356 02/07/19 0223  WBC 4.5 6.7 7.6 8.0 9.0  NEUTROABS 3.7 5.9 6.8 6.8 6.9  HGB 14.2 12.8* 12.4* 12.4* 12.7*  HCT 42.8 38.0* 36.7* 36.7* 37.2*  MCV 93.0 90.5 89.1 89.3 89.6  PLT 192 272 302 288 288   Basic Metabolic Panel: Recent Labs  Lab 02/03/19 0334 02/04/19 0348 02/05/19 0401 02/06/19 0356 02/07/19 0223  NA 140 137 137 137 137  K 4.7 4.3 4.3 4.3 4.0  CL 104 106 104 104 103  CO2 21* 22 24 24 27   GLUCOSE 157* 225* 258* 203* 144*  BUN 25* 35* 36* 33* 33*  CREATININE 1.26* 1.22 1.33* 1.17 1.04  CALCIUM 8.4* 8.4* 8.5* 8.7* 8.6*  MG 1.9 1.8 1.8 1.8 2.0   GFR: Estimated Creatinine Clearance: 68.1 mL/min (by C-G formula based on SCr of 1.04 mg/dL). Liver Function Tests: Recent Labs  Lab 02/03/19 0334 02/04/19 0348 02/05/19 0401 02/06/19 0356 02/07/19 0223  AST 40 30 29 32 28  ALT 17 17 19 22 25   ALKPHOS 57 51 51 46 45  BILITOT 1.5* 0.7 0.4 0.8 0.7  PROT 7.2 6.8 6.8 6.6 6.2*  ALBUMIN 2.9* 2.7* 2.7* 2.7* 2.7*   No results for input(s): LIPASE, AMYLASE in the last 168 hours. No results for input(s): AMMONIA in the last 168 hours. Coagulation Profile: No results for input(s): INR, PROTIME in the last 168 hours. Cardiac Enzymes: No results for input(s): CKTOTAL, CKMB, CKMBINDEX, TROPONINI in the last 168 hours. BNP (last 3 results) No results for input(s): PROBNP in the last 8760 hours. HbA1C: No results for input(s): HGBA1C in the last 72 hours. CBG: Recent Labs  Lab 02/06/19 1640 02/06/19 2150 02/07/19 0727 02/07/19 1159 02/07/19 1730  GLUCAP 231* 203* 111* 249* 195*   Lipid  Profile: No results for input(s): CHOL, HDL, LDLCALC, TRIG, CHOLHDL, LDLDIRECT in the last 72 hours. Thyroid Function Tests: No results for input(s): TSH, T4TOTAL, FREET4, T3FREE, THYROIDAB in the last 72 hours. Anemia Panel: Recent Labs    02/06/19 0356 02/07/19 0223  FERRITIN 419* 437*   Sepsis Labs: Recent Labs  Lab 02/02/19 1345 02/02/19 1910  PROCALCITON 0.16  --   LATICACIDVEN 1.6 1.6    Recent Results (from the past 240 hour(s))  Blood Culture (routine x 2)     Status: None   Collection Time: 02/02/19 12:35 PM   Specimen: BLOOD LEFT HAND  Result Value Ref Range Status   Specimen Description   Final    BLOOD LEFT HAND Performed at [facility], [address], [place], [place]    Special Requests   Final    BOTTLES DRAWN AEROBIC AND ANAEROBIC Blood Culture  results may not be optimal due to an inadequate volume of blood received in culture bottles Performed at [facility], [address] [place], [place] [address]    Culture   Final    NO GROWTH 5 DAYS Performed at [facility], [address] [place], [place] [address]    Report Status 02/07/2019 FINAL  Final  Blood Culture (routine x 2)     Status: None   Collection Time: 02/02/19 12:40 PM   Specimen: BLOOD  Result Value Ref Range Status   Specimen Description   Final    BLOOD LEFT ARM Performed at [facility], [address] [place], [place] [address]    Special Requests   Final    BOTTLES DRAWN AEROBIC ONLY Blood Culture results may not be optimal due to an inadequate volume of blood received in culture bottles Performed at [facility], [address], [place], [place] [address]    Culture   Final    NO GROWTH 5 DAYS Performed at [facility], [address] [place], [place] [address]    Report Status 02/07/2019 FINAL  Final  SARS CORONAVIRUS 2 (TAT 6-24 HRS) Nasopharyngeal Nasopharyngeal Swab      Status: Abnormal   Collection Time: 02/04/19  5:18 PM   Specimen: Nasopharyngeal Swab  Result Value Ref Range Status   SARS Coronavirus 2 POSITIVE (A) NEGATIVE Final    Comment: RESULT CALLED TO, READ BACK BY AND VERIFIED WITH: Cory Munch RN 11:05 02/05/19 ([email or web address]) (NOTE) SARS-CoV-2 target nucleic acids are DETECTED. The SARS-CoV-2 RNA is generally detectable in upper and lower respiratory specimens during the acute phase of infection. Positive results are indicative of the presence of SARS-CoV-2 RNA. Clinical correlation with patient history and other diagnostic information is  necessary to determine patient infection status. Positive results do not rule out bacterial infection or co-infection with other viruses.  The expected result is Negative. Fact Sheet for Patients: HairSlick.no Fact Sheet for Healthcare Providers: quierodirigir.com This test is not yet approved or cleared by the [place] FDA and  has been authorized for detection and/or diagnosis of SARS-CoV-2 by FDA under an Emergency Use Authorization (EUA). This EUA will remain  in effect (meaning this test can be used) for th e duration of the COVID-19 declaration under Section 564(b)(1) of the Act, 21 U.S.C. section 360bbb-3(b)(1), unless the authorization is terminated or revoked sooner. Performed at [facility], [address], [place], [place] [address]      Radiology Studies: No results found.  Scheduled Meds: . amLODipine  10 mg Oral Daily  . vitamin C  500 mg Oral Daily  . aspirin  81 mg Oral QODAY  . dexamethasone (DECADRON) injection  6 mg Intravenous Daily  . enoxaparin (LOVENOX) injection  40 mg Subcutaneous Q12H  . insulin aspart  0-15 Units Subcutaneous TID WC  . insulin aspart  0-5 Units Subcutaneous QHS  . Ipratropium-Albuterol  1 puff Inhalation TID  . linagliptin  5 mg Oral Daily  . multivitamin with minerals  1 tablet Oral Daily   . zinc sulfate  220 mg Oral Daily   Continuous Infusions: . remdesivir 100 mg in NS 100 mL 100 mg (02/07/19 0917)     LOS: 5 days   Rickey Barbara, MD [facility] Pager On Amion  If 7PM-7AM, please contact night-coverage 02/07/2019, 5:58 PM

## 2019-02-07 NOTE — Progress Notes (Signed)
   02/07/19 1200  Clinical Encounter Type  Visited With Patient;Other (Comment)  Visit Type Initial;Psychological support;Spiritual support  Referral From Nurse  Consult/Referral To Chaplain  Spiritual Encounters  Spiritual Needs Emotional;Other (Comment) (Clothing )  Stress Factors  Patient Stress Factors Other (Comment) (Clothing )   I provided the patient with lounge pants per request from the nurse.   Please, contact Spiritual Care for further assistance.   Chaplain Clint Bolder M.Div., [facility]

## 2019-02-07 NOTE — TOC Progression Note (Signed)
Transition of Care [facility]) - Progression Note    Patient Details  Name: Thomas Joseph MRN: [id number] Date of Birth: Aug 26, 1933  [facility] CM/SW Contact  Geni Bers, RN Phone Number: 02/07/2019, 3:34 PM  Clinical Narrative:    Pt from home with wife. TOC will follow for discharge needs.         Expected Discharge Plan and Services                                                 Social Determinants of Health (SDOH) Interventions    Readmission Risk Interventions No flowsheet data found.

## 2019-02-07 NOTE — Plan of Care (Signed)
  Problem: Education: Goal: Knowledge of General Education information will improve Description: Including pain rating scale, medication(s)/side effects and non-pharmacologic comfort measures Outcome: Progressing   Problem: Health Behavior/Discharge Planning: Goal: Ability to manage health-related needs will improve Outcome: Progressing   Problem: Clinical Measurements: Goal: Ability to maintain clinical measurements within normal limits will improve Outcome: Progressing Goal: Respiratory complications will improve Outcome: Progressing   Problem: Nutrition: Goal: Adequate nutrition will be maintained Outcome: Progressing   Problem: Elimination: Goal: Will not experience complications related to urinary retention Outcome: Progressing   Problem: Pain Managment: Goal: General experience of comfort will improve Outcome: Progressing   Problem: Safety: Goal: Ability to remain free from injury will improve Outcome: Progressing

## 2019-02-08 DIAGNOSIS — U071 COVID-19: Principal | ICD-10-CM

## 2019-02-08 LAB — COMPREHENSIVE METABOLIC PANEL
ALT: 33 U/L (ref 0–44)
AST: 34 U/L (ref 15–41)
Albumin: 2.5 g/dL — ABNORMAL LOW (ref 3.5–5.0)
Alkaline Phosphatase: 44 U/L (ref 38–126)
Anion gap: 7 (ref 5–15)
BUN: 32 mg/dL — ABNORMAL HIGH (ref 8–23)
CO2: 25 mmol/L (ref 22–32)
Calcium: 8.6 mg/dL — ABNORMAL LOW (ref 8.9–10.3)
Chloride: 103 mmol/L (ref 98–111)
Creatinine, Ser: 1.16 mg/dL (ref 0.61–1.24)
GFR calc Af Amer: >60 mL/min (ref >60)
GFR calc non Af Amer: 58 mL/min — ABNORMAL LOW (ref >60)
Glucose, Bld: 141 mg/dL — ABNORMAL HIGH (ref 70–99)
Potassium: 4 mmol/L (ref 3.5–5.1)
Sodium: 135 mmol/L (ref 135–145)
Total Bilirubin: 0.4 mg/dL (ref 0.3–1.2)
Total Protein: 5.9 g/dL — ABNORMAL LOW (ref 6.5–8.1)

## 2019-02-08 LAB — C-REACTIVE PROTEIN: CRP: 2 mg/dL — ABNORMAL HIGH (ref <1.0)

## 2019-02-08 LAB — FERRITIN: Ferritin: 395 ng/mL — ABNORMAL HIGH (ref 24–336)

## 2019-02-08 LAB — GLUCOSE, CAPILLARY
Glucose-Capillary: 122 mg/dL — ABNORMAL HIGH (ref 70–99)
Glucose-Capillary: 131 mg/dL — ABNORMAL HIGH (ref 70–99)

## 2019-02-08 MED ORDER — ASCORBIC ACID 500 MG PO TABS: 500.0000 mg | ORAL_TABLET | Freq: Every day | ORAL | 0 refills | Status: AC

## 2019-02-08 MED ORDER — DEXAMETHASONE 6 MG PO TABS: 6.0000 mg | ORAL_TABLET | Freq: Every day | ORAL | 0 refills | Status: AC

## 2019-02-08 MED ORDER — ENOXAPARIN SODIUM 40 MG/0.4ML ~~LOC~~ SOLN: 40.0000 mg | SUBCUTANEOUS | Status: DC

## 2019-02-08 MED ORDER — ZINC SULFATE 220 (50 ZN) MG PO CAPS: 220.0000 mg | ORAL_CAPSULE | Freq: Every day | ORAL | 0 refills | Status: AC

## 2019-02-08 MED ORDER — MAGNESIUM CITRATE PO SOLN
1.0000 | Freq: Once | ORAL | Status: AC
  Administered 2019-02-08: 1 via ORAL
  Filled 2019-02-08: qty 296

## 2019-02-08 MED ORDER — BISACODYL 10 MG RE SUPP
10.0000 mg | Freq: Once | RECTAL | Status: AC
  Administered 2019-02-08: 10 mg via RECTAL
  Filled 2019-02-08: qty 1

## 2019-02-08 NOTE — Discharge Instructions (Addendum)
You are scheduled for an outpatient infusion of Remdesivir at 230PM on Friday 1/29.  Please report to [facility] at [address]  Drive to the security guard and tell them you are here for an infusion. They will direct you to the front entrance where we will come and get you.  For questions call [phone].  Thanks

## 2019-02-08 NOTE — Progress Notes (Signed)
Pt a & o x 4. Dc instructions given, pt verbalized understanding and is aware of infusion appt. All questions answered.

## 2019-02-08 NOTE — Discharge Summary (Signed)
Physician Discharge Summary  Thomas Joseph DXI:[id number] DOB: [birth year] DOA: 02/02/2019  PCP: Mila Palmer, MD  Admit date: 02/02/2019 Discharge date: 02/08/2019  Admitted From: Home Disposition:  Home  Recommendations for Outpatient Follow-up:  1. Follow up with PCP in 1-2 weeks 2. Follow up at [facility] for final dose of remdesivir on 1/29  Discharge Condition:Improved CODE STATUS:Full Diet recommendation: Diabetic   Brief/Interim Summary: 86 y.o.malewithPMH significant for HTN,T2DMwho lives at home with his wife and is physically active.  Patient presented to the ED on 1/22 with progressive shortness of breath, weakness, body aches,loss of taste/appetite. Patient reports recently received initial dose of Covid-19 vaccination. He also reports that his wife at home is positive for Covid-19. In the ED, patient was afebrile, oxygen saturation 85% on room air requiring supplemental oxygen by nasal cannula. Blood work showed WBC count 5.4,BUN 26, creatinine 1.47 Covid PCR obtained on admission was negative however inflammatory markers were elevated D-dimer 1.30, LDH 243, ferritin 428, CRP 22.1, lactic acid 1.6, procalcitonin 0.16.  Chest x-ray with bilateral interstitial edema consistent with multifocal pneumonia. Patient was admitted as PUI. A repeat Covid PCR was obtained on 1/24, resulted positive on 1/25.  Discharge Diagnoses:  Principal Problem:   Acute respiratory failure with hypoxia (HCC) Active Problems:   Essential hypertension   Type 2 diabetes mellitus with complication, without long-term current use of insulin (HCC)   Hypokalemia  Principal Problem: Acute respiratory failure with hypoxia with COVID 19 pneumonia - initial COVID swab was not sent to lab- rechecked on 1/25 and found to be + - admits to fever, dyspnea on exertion and cough. His spouse has COVID as well - CXR > multifocal pneumonia - cont Decadron and Remdesivir- Remdesivir started on  1/25 - last dose will be 1/29- inflammatory markers improving - cont Vit C, Zinc and inhalers - Successfully weaned to room air -Pt to receive final dose of Remdesivir as outpatient on 1/29 at 230pm at [facility] -Will complete 6 more days of oral decadron to complete course of steroids  Active Problems: AKI- dehydration - Cr 1.47 on admission- improved to normal- HCTZ being held -Renal function improved  Essential hypertension -BP stable at this time - cont Amlodipine- hold Lisinopril and HCTZ -BP remains stable at time of d/c -Would f/u blood pressure closely as outpatient and titrate bp meds as needed  Type 2 diabetes mellitus with complication, without long-term current use of insulin - A1c is 7.0 - Metformin on hold while in hospital -Resume home meds on d/c  Hypokalemia- 3.4 - Likely secondary to HCTZ- improved  Anemia of chronic disease - Hb 12-13 range  Discharge Instructions   Allergies as of 02/08/2019      Reactions   Hydrochlorothiazide Other (See Comments)   Reaction not recalled, believes it just made him urinate very frequently   Lipitor [atorvastatin] Other (See Comments)   Muscle pain   Viagra [sildenafil Citrate] Palpitations      Medication List    STOP taking these medications   ibuprofen 600 MG tablet Commonly known as: ADVIL   lisinopril-hydrochlorothiazide 20-12.5 MG tablet Commonly known as: ZESTORETIC   meloxicam 15 MG tablet Commonly known as: MOBIC   tadalafil 20 MG tablet Commonly known as: CIALIS     TAKE these medications   acetaminophen 325 MG tablet Commonly known as: TYLENOL Take 325-650 mg by mouth every 12 (twelve) hours as needed for mild pain or headache.   amLODipine 10 MG tablet Commonly known as: NORVASC Take  10 mg by mouth daily.   ascorbic acid 500 MG tablet Commonly known as: VITAMIN C Take 1 tablet (500 mg total) by mouth daily for 7 days. Start taking on: February 09, 2019   aspirin 81 MG chewable  tablet Chew 81 mg by mouth every other day.   cetirizine 10 MG tablet Commonly known as: ZYRTEC Take 10 mg by mouth daily as needed for allergies.   dexamethasone 6 MG tablet Commonly known as: DECADRON Take 1 tablet (6 mg total) by mouth daily for 6 days. Start taking on: February 09, 2019   latanoprost 0.005 % ophthalmic solution Commonly known as: XALATAN Place 1 drop into both eyes at bedtime.   metFORMIN 500 MG 24 hr tablet Commonly known as: GLUCOPHAGE-XR Take 1,000 mg by mouth daily.   zinc sulfate 220 (50 Zn) MG capsule Take 1 capsule (220 mg total) by mouth daily for 7 days. Start taking on: February 09, 2019      Follow-up Information    Mila Palmer, MD. Schedule an appointment as soon as possible for a visit in 3 week(s).   Specialty: Family Medicine Contact information: [address] [place] [address] [phone]          Allergies  Allergen Reactions  . Hydrochlorothiazide Other (See Comments)    Reaction not recalled, believes it just made him urinate very frequently  . Lipitor [Atorvastatin] Other (See Comments)    Muscle pain  . Viagra [Sildenafil Citrate] Palpitations     Procedures/Studies: DG Chest Port 1 View  Result Date: 02/02/2019 CLINICAL DATA:  COVID infection suspected. URI symptoms 3 days after COVID vaccine. EXAM: PORTABLE CHEST 1 VIEW COMPARISON:  01/02/2014. FINDINGS: Mediastinum hilar structures are unremarkable. Mild cardiomegaly. Bilateral interstitial prominence. Interstitial edema and/or pneumonitis could present this fashion. Right base subsegmental atelectasis. No pleural effusion or pneumothorax. Degenerative change thoracic spine. IMPRESSION: 1.  Cardiomegaly.  No pulmonary venous congestion. 2. Bilateral interstitial prominence consistent with interstitial edema and/or pneumonitis. 3.  Right base subsegmental atelectasis. Electronically Signed   By: Maisie Fus  Register   On: 02/02/2019 13:13      Subjective: Without complaints this AM  Discharge Exam: Vitals:   02/08/19 0552 02/08/19 1322  BP: 138/80 133/70  Pulse: (!) 54 70  Resp: 15   Temp: 98.1 F (36.7 C) 97.6 F (36.4 C)  SpO2: 97% 98%   Vitals:   02/07/19 1500 02/07/19 2047 02/08/19 0552 02/08/19 1322  BP: 128/67 138/73 138/80 133/70  Pulse: 61 (!) 57 (!) 54 70  Resp:  17 15   Temp: 97.7 F (36.5 C) 98.6 F (37 C) 98.1 F (36.7 C) 97.6 F (36.4 C)  TempSrc: Oral Oral Oral Oral  SpO2: 95% 95% 97% 98%  Weight:      Height:        General: Pt is alert, awake, not in acute distress Cardiovascular: RRR, S1/S2 +, no rubs, no gallops Respiratory: CTA bilaterally, no wheezing, no rhonchi Abdominal: Soft, NT, ND, bowel sounds + Extremities: no edema, no cyanosis   The results of significant diagnostics from this hospitalization (including imaging, microbiology, ancillary and laboratory) are listed below for reference.     Microbiology: Recent Results (from the past 240 hour(s))  Blood Culture (routine x 2)     Status: None   Collection Time: 02/02/19 12:35 PM   Specimen: BLOOD LEFT HAND  Result Value Ref Range Status   Specimen Description   Final    BLOOD LEFT HAND  Performed at [facility], [address] [place], [place] [address]    Special Requests   Final    BOTTLES DRAWN AEROBIC AND ANAEROBIC Blood Culture results may not be optimal due to an inadequate volume of blood received in culture bottles Performed at [facility], [address] [place], [place] [address]    Culture   Final    NO GROWTH 5 DAYS Performed at [facility], [address] [place], [place] [address]    Report Status 02/07/2019 FINAL  Final  Blood Culture (routine x 2)     Status: None   Collection Time: 02/02/19 12:40 PM   Specimen: BLOOD  Result Value Ref Range Status   Specimen Description   Final    BLOOD LEFT ARM Performed at [facility], [address] [place], [place] [address]    Special Requests   Final    BOTTLES DRAWN AEROBIC ONLY Blood Culture results may not be optimal due to an inadequate volume of blood received in culture bottles Performed at [facility], [address] [place], [place] [address]    Culture   Final    NO GROWTH 5 DAYS Performed at [facility], [address] [place], [place] [address]    Report Status 02/07/2019 FINAL  Final  SARS CORONAVIRUS 2 (TAT 6-24 HRS) Nasopharyngeal Nasopharyngeal Swab     Status: Abnormal   Collection Time: 02/04/19  5:18 PM   Specimen: Nasopharyngeal Swab  Result Value Ref Range Status   SARS Coronavirus 2 POSITIVE (A) NEGATIVE Final    Comment: RESULT CALLED TO, READ BACK BY AND VERIFIED WITH: Cory MunchA. Melton RN 11:05 02/05/19 ([email or web address]) (NOTE) SARS-CoV-2 target nucleic acids are DETECTED. The SARS-CoV-2 RNA is generally detectable in upper and lower respiratory specimens during the acute phase of infection. Positive results are indicative of the presence of SARS-CoV-2 RNA. Clinical correlation with patient history and other diagnostic information is  necessary to determine patient infection status. Positive results do not rule out bacterial infection or co-infection with other viruses.  The expected result is Negative. Fact Sheet for Patients: HairSlick.nohttps://www.fda.gov/media/138098/download Fact Sheet for Healthcare Providers: quierodirigir.comhttps://www.fda.gov/media/138095/download This test is not yet approved or cleared by the [place] FDA and  has been authorized for detection and/or diagnosis of SARS-CoV-2 by FDA under an Emergency Use Authorization (EUA). This EUA will remain  in effect (meaning this test can be used) for th e duration of the COVID-19 declaration under Section 564(b)(1) of the Act, 21 U.S.C. section 360bbb-3(b)(1), unless the authorization is terminated or revoked sooner. Performed at [facility], [address]  [place], [place] [address]      Labs: BNP (last 3 results) No results for input(s): BNP in the last 8760 hours. Basic Metabolic Panel: Recent Labs  Lab 02/03/19 0334 02/03/19 0334 02/04/19 0348 02/05/19 0401 02/06/19 0356 02/07/19 0223 02/08/19 0352  NA 140   < > 137 137 137 137 135  K 4.7   < > 4.3 4.3 4.3 4.0 4.0  CL 104   < > 106 104 104 103 103  CO2 21*   < > 22 24 24 27 25   GLUCOSE 157*   < > 225* 258* 203* 144* 141*  BUN 25*   < > 35* 36* 33* 33* 32*  CREATININE 1.26*   < > 1.22 1.33* 1.17 1.04 1.16  CALCIUM 8.4*   < > 8.4* 8.5* 8.7* 8.6*  8.6*  MG 1.9  --  1.8 1.8 1.8 2.0  --    < > = values in this interval not displayed.   Liver Function Tests: Recent Labs  Lab 02/04/19 0348 02/05/19 0401 02/06/19 0356 02/07/19 0223 02/08/19 0352  AST 30 29 32 28 34  ALT 17 19 22 25  33  ALKPHOS 51 51 46 45 44  BILITOT 0.7 0.4 0.8 0.7 0.4  PROT 6.8 6.8 6.6 6.2* 5.9*  ALBUMIN 2.7* 2.7* 2.7* 2.7* 2.5*   No results for input(s): LIPASE, AMYLASE in the last 168 hours. No results for input(s): AMMONIA in the last 168 hours. CBC: Recent Labs  Lab 02/03/19 0334 02/04/19 0348 02/05/19 0401 02/06/19 0356 02/07/19 0223  WBC 4.5 6.7 7.6 8.0 9.0  NEUTROABS 3.7 5.9 6.8 6.8 6.9  HGB 14.2 12.8* 12.4* 12.4* 12.7*  HCT 42.8 38.0* 36.7* 36.7* 37.2*  MCV 93.0 90.5 89.1 89.3 89.6  PLT 192 272 302 288 288   Cardiac Enzymes: No results for input(s): CKTOTAL, CKMB, CKMBINDEX, TROPONINI in the last 168 hours. BNP: Invalid input(s): POCBNP CBG: Recent Labs  Lab 02/07/19 1159 02/07/19 1730 02/07/19 2044 02/08/19 0748 02/08/19 1131  GLUCAP 249* 195* 249* 122* 131*   D-Dimer Recent Labs    02/06/19 0356 02/07/19 0223  DDIMER 0.52* 0.54*   Hgb A1c No results for input(s): HGBA1C in the last 72 hours. Lipid Profile No results for input(s): CHOL, HDL, LDLCALC, TRIG, CHOLHDL, LDLDIRECT in the last 72 hours. Thyroid function studies No results for input(s): TSH, T4TOTAL, T3FREE,  THYROIDAB in the last 72 hours.  Invalid input(s): FREET3 Anemia work up Recent Labs    02/07/19 0223 02/08/19 0352  FERRITIN 437* 395*   Urinalysis    Component Value Date/Time   COLORURINE YELLOW 02/01/2018 1750   APPEARANCEUR CLEAR 02/01/2018 1750   LABSPEC 1.021 02/01/2018 1750   PHURINE 5.0 02/01/2018 1750   GLUCOSEU >=500 (A) 02/01/2018 1750   HGBUR NEGATIVE 02/01/2018 1750   BILIRUBINUR NEGATIVE 02/01/2018 1750   KETONESUR NEGATIVE 02/01/2018 1750   PROTEINUR 30 (A) 02/01/2018 1750   UROBILINOGEN 0.2 04/18/2010 0910   NITRITE NEGATIVE 02/01/2018 1750   LEUKOCYTESUR NEGATIVE 02/01/2018 1750   Sepsis Labs Invalid input(s): PROCALCITONIN,  WBC,  LACTICIDVEN Microbiology Recent Results (from the past 240 hour(s))  Blood Culture (routine x 2)     Status: None   Collection Time: 02/02/19 12:35 PM   Specimen: BLOOD LEFT HAND  Result Value Ref Range Status   Specimen Description   Final    BLOOD LEFT HAND Performed at [facility], [place] [address] [place], [place] [address]    Special Requests   Final    BOTTLES DRAWN AEROBIC AND ANAEROBIC Blood Culture results may not be optimal due to an inadequate volume of blood received in culture bottles Performed at [facility] [address] [place], [place] [address]    Culture   Final    NO GROWTH 5 DAYS Performed at [facility], [place] [address], [place], [place] [address]    Report Status 02/07/2019 FINAL  Final  Blood Culture (routine x 2)     Status: None   Collection Time: 02/02/19 12:40 PM   Specimen: BLOOD  Result Value Ref Range Status   Specimen Description   Final    BLOOD LEFT ARM Performed at [facility] [address] [place], [place] [address]    Special Requests   Final  BOTTLES DRAWN AEROBIC ONLY Blood Culture results may not be optimal due to an inadequate volume of blood received in culture bottles Performed at [facility], [address], [place], [place] [address]    Culture   Final    NO GROWTH 5 DAYS Performed at [facility], [address], [place], [place] [address]    Report Status 02/07/2019 FINAL  Final  SARS CORONAVIRUS 2 (TAT 6-24 HRS) Nasopharyngeal Nasopharyngeal Swab     Status: Abnormal   Collection Time: 02/04/19  5:18 PM   Specimen: Nasopharyngeal Swab  Result Value Ref Range Status   SARS Coronavirus 2 POSITIVE (A) NEGATIVE Final    Comment: RESULT CALLED TO, READ BACK BY AND VERIFIED WITH: Cory Munch RN 11:05 02/05/19 ([email or web address]) (NOTE) SARS-CoV-2 target nucleic acids are DETECTED. The SARS-CoV-2 RNA is generally detectable in upper and lower respiratory specimens during the acute phase of infection. Positive results are indicative of the presence of SARS-CoV-2 RNA. Clinical correlation with patient history and other diagnostic information is  necessary to determine patient infection status. Positive results do not rule out bacterial infection or co-infection with other viruses.  The expected result is Negative. Fact Sheet for Patients: HairSlick.no Fact Sheet for Healthcare Providers: quierodirigir.com This test is not yet approved or cleared by the [place] FDA and  has been authorized for detection and/or diagnosis of SARS-CoV-2 by FDA under an Emergency Use Authorization (EUA). This EUA will remain  in effect (meaning this test can be used) for th e duration of the COVID-19 declaration under Section 564(b)(1) of the Act, 21 U.S.C. section 360bbb-3(b)(1), unless the authorization is terminated or revoked sooner. Performed at [facility], [address] [place], [place] [address]    Time spent:  SIGNED:   Rickey Barbara, MD  [facility] 02/08/2019, 1:43 PM  If 7PM-7AM, please contact night-coverage

## 2019-02-08 NOTE — Progress Notes (Signed)
Patient scheduled for outpatient Remdesivir infusion at 230PM Friday 1/29.  Please advise them to report to [facility] at [address]  Drive to the security guard and tell them you are here for an infusion. They will direct you to the front entrance where we will come and get you.  For questions call [phone].  Thanks

## 2019-02-09 ENCOUNTER — Ambulatory Visit (HOSPITAL_COMMUNITY)
Admission: RE | Admit: 2019-02-09 | Discharge: 2019-02-09 | Disposition: A | Discharge status: Home / Self Care | Payer: Medicare Other | Source: Ambulatory Visit | Attending: Pulmonary Disease | Admitting: Pulmonary Disease

## 2019-02-09 DIAGNOSIS — U071 COVID-19: Secondary | ICD-10-CM | POA: Insufficient documentation

## 2019-02-09 MED ORDER — ALBUTEROL SULFATE HFA 108 (90 BASE) MCG/ACT IN AERS: 2.0000 | INHALATION_SPRAY | Freq: Once | RESPIRATORY_TRACT | Status: DC | PRN

## 2019-02-09 MED ORDER — FAMOTIDINE IN NACL 20-0.9 MG/50ML-% IV SOLN: 20.0000 mg | Freq: Once | INTRAVENOUS | Status: DC | PRN

## 2019-02-09 MED ORDER — SODIUM CHLORIDE 0.9 % IV SOLN
INTRAVENOUS | Status: AC
  Administered 2019-02-09: 15:00:00 100 mg via INTRAVENOUS
  Filled 2019-02-09: qty 20

## 2019-02-09 MED ORDER — SODIUM CHLORIDE 0.9 % IV SOLN: 100.0000 mg | Freq: Once | INTRAVENOUS | Status: AC

## 2019-02-09 MED ORDER — DIPHENHYDRAMINE HCL 50 MG/ML IJ SOLN: 50.0000 mg | Freq: Once | INTRAMUSCULAR | Status: DC | PRN

## 2019-02-09 MED ORDER — SODIUM CHLORIDE 0.9 % IV SOLN: INTRAVENOUS | Status: DC | PRN

## 2019-02-09 MED ORDER — METHYLPREDNISOLONE SODIUM SUCC 125 MG IJ SOLR: 125.0000 mg | Freq: Once | INTRAMUSCULAR | Status: DC | PRN

## 2019-02-09 MED ORDER — EPINEPHRINE 0.3 MG/0.3ML IJ SOAJ: 0.3000 mg | Freq: Once | INTRAMUSCULAR | Status: DC | PRN

## 2019-02-09 NOTE — Discharge Instructions (Signed)

## 2019-02-09 NOTE — Progress Notes (Signed)
  Diagnosis: COVID-19  Physician: Dr. Delford Field   Procedure: Covid Infusion Clinic Med: remdesivir infusion.  Complications: No immediate complications noted.  Discharge: Discharged home   Thomas Joseph 02/09/2019

## 2019-02-13 ENCOUNTER — Ambulatory Visit: Payer: Medicare Other

## 2019-03-05 ENCOUNTER — Ambulatory Visit
Admission: RE | Admit: 2019-03-05 | Discharge: 2019-03-05 | Disposition: A | Discharge status: Home / Self Care | Payer: Medicare Other | Source: Ambulatory Visit | Attending: Family Medicine | Admitting: Family Medicine

## 2019-03-05 ENCOUNTER — Other Ambulatory Visit: Payer: Self-pay | Admitting: Family Medicine

## 2019-03-05 DIAGNOSIS — U071 COVID-19: Secondary | ICD-10-CM

## 2019-03-07 ENCOUNTER — Ambulatory Visit: Payer: Medicare Other | Admitting: Podiatry

## 2019-04-11 ENCOUNTER — Other Ambulatory Visit: Payer: Self-pay

## 2019-04-11 ENCOUNTER — Ambulatory Visit (HOSPITAL_COMMUNITY)
Admission: RE | Admit: 2019-04-11 | Discharge: 2019-04-11 | Disposition: A | Discharge status: Home / Self Care | Payer: Medicare Other | Source: Ambulatory Visit | Attending: Nurse Practitioner | Admitting: Nurse Practitioner

## 2019-04-11 ENCOUNTER — Ambulatory Visit (INDEPENDENT_AMBULATORY_CARE_PROVIDER_SITE_OTHER): Payer: Medicare Other | Admitting: Nurse Practitioner

## 2019-04-11 VITALS — BP 130/68 | HR 80 | Temp 96.9°F | Ht 72.0 in | Wt 230.2 lb

## 2019-04-11 DIAGNOSIS — R0602 Shortness of breath: Secondary | ICD-10-CM | POA: Insufficient documentation

## 2019-04-11 DIAGNOSIS — R5383 Other fatigue: Secondary | ICD-10-CM

## 2019-04-11 DIAGNOSIS — I499 Cardiac arrhythmia, unspecified: Secondary | ICD-10-CM | POA: Diagnosis not present

## 2019-04-11 DIAGNOSIS — J9811 Atelectasis: Secondary | ICD-10-CM | POA: Insufficient documentation

## 2019-04-11 DIAGNOSIS — Z8616 Personal history of COVID-19: Secondary | ICD-10-CM

## 2019-04-11 DIAGNOSIS — R531 Weakness: Secondary | ICD-10-CM

## 2019-04-11 NOTE — Progress Notes (Signed)
@Patient  ID: Thomas Joseph, male    DOB: Apr 02, 1933, 86 y.o.   MRN: [id number]  Chief Complaint  Patient presents with  . Post COVID    Tested positive 1/24. Admitted 1/22-1/28. Was referred to [place] by his PCP at [facility] is currently haveing ongoing symptoms of SOB and fatigue.    Referring provider: Jonathon Jordan, MD  86 year old male with history of hypertension, type 2 diabetes mellitus, hypokalemia.  Diagnosed with Covid in January 2021.  Recent Significant Encounters:   02/02/19 - 02/08/19 Hospital admission: Patient was admitted to the hospital for acute respiratory failure with hypoxia with Covid pneumonia.  Patient was treated with remdesivir and Decadron.  Patient was discharged home on room air.  Imaging:   02/02/19 Chest xray: Cardiomegaly.  No pulmonary venous congestion. Bilateral interstitial prominence consistent with interstitial edema and/or pneumonitis. Right base subsegmental atelectasis.  03/06/19 chest xray: Lungs clear. No adenopathy.   HPI Patient presents today for post Covid care clinic visit.  Patient states that since hospital discharge he has continued to have ongoing shortness of breath with exertion.  He also complains today of fatigue.  Patient was discharged home from the hospital on room air. He was not ordered physical therapy.  He states that he has been more sedentary since hospital discharge.  He states that he does have a good appetite and is drinking plenty of fluids.  He is compliant with medications.  He denies any cough or wheeze.  He has had recent labs through PCP office.  He has not had a follow-up chest x-ray since 03/05/2019. Denies f/c/s, n/v/d, hemoptysis, PND, chest pain or edema.    Note: patient walked in office today - sats remained over 96% for entire walk - heart rate and rhythm irregular with exertion - this could be due to frequent PVC's, but will refer to cardiology for further evaluation. Heart rate dropped to 46 while  ambulating - this may not be a true reading due to heart rhythm irregularity.         Allergies  Allergen Reactions  . Hydrochlorothiazide Other (See Comments)    Reaction not recalled, believes it just made him urinate very frequently  . Lipitor [Atorvastatin] Other (See Comments)    Muscle pain  . Viagra [Sildenafil Citrate] Palpitations    Immunization History  Administered Date(s) Administered  . PFIZER SARS-COV-2 Vaccination 01/24/2019    Past Medical History:  Diagnosis Date  . Anemia   . BPH (benign prostatic hyperplasia)   . Diabetes mellitus without complication ([place])   . ED (erectile dysfunction)   . Family history of adverse reaction to anesthesia    son had some problem years ago, pt unsure of what exact problem was  . Hypertension   . Obesity   . Pneumonia     Tobacco History: Social History   Tobacco Use  Smoking Status Former Smoker  . Quit date: 11/28/1984  . Years since quitting: 34.3  Smokeless Tobacco Never Used   Counseling given: Not Answered   Outpatient Encounter Medications as of 04/11/2019  Medication Sig  . acetaminophen (TYLENOL) 325 MG tablet Take 325-650 mg by mouth every 12 (twelve) hours as needed for mild pain or headache.  Marland Kitchen amLODipine (NORVASC) 10 MG tablet Take 10 mg by mouth daily.  Marland Kitchen aspirin 81 MG chewable tablet Chew 81 mg by mouth every other day.   . cetirizine (ZYRTEC) 10 MG tablet Take 10 mg by mouth daily as needed for allergies.  Marland Kitchen  latanoprost (XALATAN) 0.005 % ophthalmic solution Place 1 drop into both eyes at bedtime.  . metFORMIN (GLUCOPHAGE-XR) 500 MG 24 hr tablet Take 1,000 mg by mouth daily.   . [DISCONTINUED] benazepril (LOTENSIN) 40 MG tablet Take 40 mg by mouth daily.  . [DISCONTINUED] zolpidem (AMBIEN) 10 MG tablet Take 10 mg by mouth at bedtime as needed for sleep.    No facility-administered encounter medications on file as of 04/11/2019.     Review of Systems  Review of Systems  Constitutional:  Positive for activity change (decreased) and fatigue. Negative for appetite change, chills and fever.  HENT: Negative.   Respiratory: Positive for shortness of breath. Negative for cough and wheezing.   Cardiovascular: Negative.  Negative for chest pain, palpitations and leg swelling.  Gastrointestinal: Negative.   Allergic/Immunologic: Negative.   Neurological: Negative.   Psychiatric/Behavioral: Negative for decreased concentration.       Physical Exam  BP 130/68 (BP Location: Left Arm, Patient Position: Sitting, Cuff Size: Small)   Pulse 80   Temp (!) 96.9 F (36.1 C)   Ht 6' (1.829 m)   Wt 230 lb 4 oz (104.4 kg)   SpO2 98%   BMI 31.23 kg/m   Wt Readings from Last 5 Encounters:  04/11/19 230 lb 4 oz (104.4 kg)  02/02/19 236 lb (107 kg)  12/17/17 240 lb (108.9 kg)  09/08/17 238 lb (108 kg)  12/26/14 261 lb (118.4 kg)     Physical Exam Vitals and nursing note reviewed.  Constitutional:      General: He is not in acute distress.    Appearance: He is well-developed.  Cardiovascular:     Rate and Rhythm: Normal rate. Rhythm irregular.  Pulmonary:     [place]: Pulmonary effort is normal. No accessory muscle usage.     Breath sounds: Normal breath sounds. No decreased breath sounds or wheezing.     Comments: Crackles noted to left lung base Musculoskeletal:     Right lower leg: No edema.     Left lower leg: No edema.  Skin:    General: Skin is warm and dry.  Neurological:     Mental Status: He is alert and oriented to person, place, and time.        Assessment & Plan:   History of COVID-19 Shortness of breath: Walked in office today - sats remained over 96% for entire walk. Crackles noted to left lung base. Denies any cough or fever.   Plan: Will check x ray and call with results  Continue deep breathing exercises   Irregular heart rhythm: Walked in office today - sats remained over 96% for entire walk - heart rate and rhythm irregular with exertion -  this could be due to frequent PVC's, but will refer to cardiology for further evaluation. Heart rate dropped to 46 while ambulating - this may not be a true reading due to heart rhythm irregularity.  EKG showed NSR with PAC.    Fatigue Generalized weakness: This could be due to a combination of physical deconditioning and heart issue.   Plan: Stay well hydrated Stay active  Will order PT  Covid nutritional rehabilitation handout given      Ivonne Andrew, NP 04/11/2019

## 2019-04-11 NOTE — Patient Instructions (Addendum)
Will check x ray and call with results  Stay active  Will order PT  Walked in office today - sats remained over 96% for entire walk - heart rate and rhythm irregular with exertion - this could be due to frequent PVC's, but will refer to cardiology for further evaluation. Heart rate dropped to 46 while ambulating - this may not be a true reading due to heart rhythm irregularity.    EKG showed NSR with PAC  Covid nutritional rehabilitation handout given   Fatigue If you have fatigue, you feel tired all the time and have a lack of energy or a lack of motivation. Fatigue may make it difficult to start or complete tasks because of exhaustion. In general, occasional or mild fatigue is often a normal response to activity or life. However, long-lasting (chronic) or extreme fatigue may be a symptom of a medical condition. Follow these instructions at home: General instructions  Watch your fatigue for any changes.  Go to bed and get up at the same time every day.  Avoid fatigue by pacing yourself during the day and getting enough sleep at night.  Maintain a healthy weight. Medicines  Take over-the-counter and prescription medicines only as told by your health care provider.  Take a multivitamin, if told by your health care provider.  Do not use herbal or dietary supplements unless they are approved by your health care provider. Activity   Exercise regularly, as told by your health care provider.  Use or practice techniques to help you relax, such as yoga, tai chi, meditation, or massage therapy. Eating and drinking   Avoid heavy meals in the evening.  Eat a well-balanced diet, which includes lean proteins, whole grains, plenty of fruits and vegetables, and low-fat dairy products.  Avoid consuming too much caffeine.  Avoid the use of alcohol.  Drink enough fluid to keep your urine pale yellow. Lifestyle  Change situations that cause you stress. Try to keep your work and  personal schedule in balance.  Do not use any products that contain nicotine or tobacco, such as cigarettes and e-cigarettes. If you need help quitting, ask your health care provider.  Do not use drugs. Contact a health care provider if:  Your fatigue does not get better.  You have a fever.  You suddenly lose or gain weight.  You have headaches.  You have trouble falling asleep or sleeping through the night.  You feel angry, guilty, anxious, or sad.  You are unable to have a bowel movement (constipation).  Your skin is dry.  You have swelling in your legs or another part of your body. Get help right away if:  You feel confused.  Your vision is blurry.  You feel faint or you pass out.  You have a severe headache.  You have severe pain in your abdomen, your back, or the area between your waist and hips (pelvis).  You have chest pain, shortness of breath, or an irregular or fast heartbeat.  You are unable to urinate, or you urinate less than normal.  You have abnormal bleeding, such as bleeding from the rectum, vagina, nose, lungs, or nipples.  You vomit blood.  You have thoughts about hurting yourself or others. If you ever feel like you may hurt yourself or others, or have thoughts about taking your own life, get help right away. You can go to your nearest emergency department or call:  Your local emergency services (911 in the [place]  A suicide crisis helpline,  such as the [facility] at 305-252-9235. This is open 24 hours a day. Summary  If you have fatigue, you feel tired all the time and have a lack of energy or a lack of motivation.  Fatigue may make it difficult to start or complete tasks because of exhaustion.  Long-lasting (chronic) or extreme fatigue may be a symptom of a medical condition.  Exercise regularly, as told by your health care provider.  Change situations that cause you stress. Try to keep your work and  personal schedule in balance. This information is not intended to replace advice given to you by your health care provider. Make sure you discuss any questions you have with your health care provider. Document Revised: 07/19/2018 Document Reviewed: 09/22/2016 Elsevier Patient Education  2020 [employer].

## 2019-04-11 NOTE — Assessment & Plan Note (Signed)
Shortness of breath: Walked in office today - sats remained over 96% for entire walk. Crackles noted to left lung base. Denies any cough or fever.   Plan: Will check x ray and call with results  Continue deep breathing exercises   Irregular heart rhythm: Walked in office today - sats remained over 96% for entire walk - heart rate and rhythm irregular with exertion - this could be due to frequent PVC's, but will refer to cardiology for further evaluation. Heart rate dropped to 46 while ambulating - this may not be a true reading due to heart rhythm irregularity.  EKG showed NSR with PAC.    Fatigue Generalized weakness: This could be due to a combination of physical deconditioning and heart issue.   Plan: Stay well hydrated Stay active  Will order PT  Covid nutritional rehabilitation handout given

## 2019-04-15 ENCOUNTER — Other Ambulatory Visit: Payer: Self-pay

## 2019-04-15 ENCOUNTER — Ambulatory Visit
Admission: EM | Admit: 2019-04-15 | Discharge: 2019-04-15 | Disposition: A | Discharge status: Home / Self Care | Payer: Medicare Other | Attending: Physician Assistant | Admitting: Physician Assistant

## 2019-04-15 DIAGNOSIS — K644 Residual hemorrhoidal skin tags: Secondary | ICD-10-CM

## 2019-04-15 DIAGNOSIS — R197 Diarrhea, unspecified: Secondary | ICD-10-CM

## 2019-04-15 DIAGNOSIS — K625 Hemorrhage of anus and rectum: Secondary | ICD-10-CM | POA: Diagnosis not present

## 2019-04-15 LAB — POCT URINALYSIS DIP (MANUAL ENTRY)
Bilirubin, UA: NEGATIVE
Blood, UA: NEGATIVE
Glucose, UA: 250 mg/dL — AB
Leukocytes, UA: NEGATIVE
Nitrite, UA: NEGATIVE
Protein Ur, POC: NEGATIVE mg/dL
Spec Grav, UA: 1.025 (ref 1.010–1.025)
Urobilinogen, UA: 1 E.U./dL
pH, UA: 6.5 (ref 5.0–8.0)

## 2019-04-15 LAB — POCT FASTING CBG KUC MANUAL ENTRY: POCT Glucose (KUC): 206 mg/dL — AB (ref 70–99)

## 2019-04-15 LAB — POC HEMOCCULT BLD/STL (OFFICE/1-CARD/DIAGNOSTIC): Fecal Occult Blood, POC: POSITIVE — AB

## 2019-04-15 MED ORDER — HYDROCORTISONE ACETATE 25 MG RE SUPP: 25.0000 mg | Freq: Two times a day (BID) | RECTAL | 0 refills | Status: DC

## 2019-04-15 NOTE — Discharge Instructions (Signed)
Start anusol as needed. Sitz bath-- warm bath soaks for at least 15-20 mins. Keep hydrated, urine should be clear to pale yellow in color. Please call your PCP tomorrow to let them know you are having blood in the stool, and if it has continued, or now stopped. If blood continues, please make appointment to follow up. If you develop abdominal pain, fever, weakness, dizziness, go to the emergency department for further evaluation.

## 2019-04-15 NOTE — ED Triage Notes (Addendum)
Patient presents with diarrhea x 2 episodes with nausea, no vomiting.  Symptoms started early this afternoon. Patient reports he noticed moderate amount of red blood in stool each time. He denies history of hemorrhoids, but notes having trouble constipation.

## 2019-04-15 NOTE — ED Provider Notes (Signed)
[facility]    CSN: [id number] Arrival date & time: 04/15/19  1429      History   Chief Complaint Chief Complaint  Patient presents with  . Diarrhea  . Nausea    HPI Thomas Joseph is a 86 y.o. male.   86 year old male with history of noninsulin dependant DM, BPH, HTN comes in for 2 episodes of loose stools with nausea few hours prior to arrival. States had BRB with wiping, and then noted commode to have some blood as well. Had some abdominal cramping prior to bowel movements that has completely resolved. Nausea without vomiting. Denies fever, chills, body aches. Denies weakness, dizziness, syncope. Last colonoscopy 10-15 years ago, and was told no longer needed due to age. Denies history of abnormal colonoscopy. Last a1c 7.8.  History of COVID 01/2019 requiring admission, and is followed by pulmonology since.      Past Medical History:  Diagnosis Date  . Anemia   . BPH (benign prostatic hyperplasia)   . Diabetes mellitus without complication ([place])   . ED (erectile dysfunction)   . Family history of adverse reaction to anesthesia    son had some problem years ago, pt unsure of what exact problem was  . Hypertension   . Obesity   . Pneumonia     Patient Active Problem List   Diagnosis Date Noted  . History of COVID-19 04/11/2019  . Shortness of breath 04/11/2019  . Irregular heart rhythm 04/11/2019  . Fatigue 04/11/2019  . Generalized weakness 04/11/2019  . Acute respiratory failure with hypoxia ([place]) 02/02/2019  . Essential hypertension 02/02/2019  . Type 2 diabetes mellitus with complication, without long-term current use of insulin ([place]) 02/02/2019  . Hypokalemia 02/02/2019  . BPH (benign prostatic hyperplasia) 12/26/2014    Past Surgical History:  Procedure Laterality Date  .  LEFT KNEE ARTHROCOPY    . HERNIA REPAIR    . TRANSURETHRAL RESECTION OF PROSTATE N/A 12/26/2014   Procedure: TRANSURETHRAL RESECTION OF THE PROSTATE;  Surgeon: Cleon Gustin, MD;  Location: [facility];  Service: Urology;  Laterality: N/A;       Home Medications    Prior to Admission medications   Medication Sig Start Date End Date Taking? Authorizing Provider  acetaminophen (TYLENOL) 325 MG tablet Take 325-650 mg by mouth every 12 (twelve) hours as needed for mild pain or headache.    [provider]  amLODipine (NORVASC) 10 MG tablet Take 10 mg by mouth daily.    [provider]  aspirin 81 MG chewable tablet Chew 81 mg by mouth every other day.     [provider]  cetirizine (ZYRTEC) 10 MG tablet Take 10 mg by mouth daily as needed for allergies.    [provider]  hydrocortisone (ANUSOL-HC) 25 MG suppository Place 1 suppository (25 mg total) rectally 2 (two) times daily. 04/15/19   Tasia Catchings, Liane Tribbey V, PA-C  latanoprost (XALATAN) 0.005 % ophthalmic solution Place 1 drop into both eyes at bedtime. 01/19/19   [provider]  metFORMIN (GLUCOPHAGE-XR) 500 MG 24 hr tablet Take 1,000 mg by mouth daily.     [provider]  benazepril (LOTENSIN) 40 MG tablet Take 40 mg by mouth daily.  02/02/19  [provider]  zolpidem (AMBIEN) 10 MG tablet Take 10 mg by mouth at bedtime as needed for sleep.   02/02/19  [provider]    Family History Family History  Problem Relation Age of Onset  .  CVA Father   . Hypertension Father   . Leukemia Sister   . Thyroid disease Sister   . Cancer Brother     Social History Social History   Tobacco Use  . Smoking status: Former Smoker    Quit date: 11/28/1984    Years since quitting: 34.4  . Smokeless tobacco: Never Used  Substance Use Topics  . Alcohol use: Not Currently    Comment: occasional glass of wine  . Drug use: No     Allergies   Hydrochlorothiazide, Lipitor [atorvastatin], and Viagra [sildenafil citrate]   Review of Systems Review of Systems  Reason unable to perform ROS: See HPI as above.     Physical Exam Triage Vital  Signs ED Triage Vitals [04/15/19 1445]  Enc Vitals Group     BP (!) 145/78     Pulse Rate 90     Resp 15     Temp 97.7 F (36.5 C)     Temp Source Oral     SpO2 98 %     Weight      Height      Head Circumference      Peak Flow      Pain Score 0     Pain Loc      Pain Edu?      Excl. in GC?    No data found.  Updated Vital Signs BP (!) 145/78 (BP Location: Left Arm)   Pulse 90   Temp 97.7 F (36.5 C) (Oral)   Resp 15   SpO2 98%   Physical Exam Exam conducted with a chaperone present.  Constitutional:      General: He is not in acute distress.    Appearance: Normal appearance. He is not ill-appearing, toxic-appearing or diaphoretic.  HENT:     Head: Normocephalic and atraumatic.  Cardiovascular:     Rate and Rhythm: Normal rate and regular rhythm.  Pulmonary:     Effort: Pulmonary effort is normal. No respiratory distress.     Comments: LCTAB Abdominal:     General: Bowel sounds are normal.     Palpations: Abdomen is soft.     Tenderness: There is no abdominal tenderness. There is no right CVA tenderness, left CVA tenderness, guarding or rebound.  Genitourinary:    Rectum: Guaiac result positive.     Comments: External hemorrhoid noted. No swelling, erythema, warmth to the rectum. No blood visible. No prolapse internal hemorrhoid.  Musculoskeletal:     Cervical back: Normal range of motion and neck supple.  Skin:    General: Skin is warm and dry.  Neurological:     Mental Status: He is alert and oriented to person, place, and time.      UC Treatments / Results  Labs (all labs ordered are listed, but only abnormal results are displayed) Labs Reviewed  POCT FASTING CBG [facility] MANUAL ENTRY - Abnormal; Notable for the following components:      Result Value   POCT Glucose ([facility]) 206 (*)    All other components within normal limits  POCT URINALYSIS DIP (MANUAL ENTRY) - Abnormal; Notable for the following components:   Glucose, UA =250 (*)    Ketones, POC UA  trace (5) (*)    All other components within normal limits  POC HEMOCCULT BLD/STL (OFFICE/1-CARD/DIAGNOSTIC) - Abnormal; Notable for the following components:   Fecal Occult Blood, POC Positive (*)    All other components within normal limits    EKG   Radiology No  results found.  Procedures Procedures (including critical care time)  Medications Ordered in UC Medications - No data to display  Initial Impression / Assessment and Plan / UC Course  I have reviewed the triage vital signs and the nursing notes.  Pertinent labs & imaging results that were available during my care of the patient were reviewed by me and considered in my medical decision making (see chart for details).    No alarming signs on exam. Discussed to monitor CBG, low suspicion for DKA/HHS at this time with patient's presentation. Discussed possible internal hemorrhoids causing symptoms. Will provide symptomatic treatment at this time. Patient to call PCP tomorrow to inform of current symptoms. May need further evaluation if symptoms not improving. Strict return precautions given. Patient expresses understanding and agrees to plan.  Final Clinical Impressions(s) / UC Diagnoses   Final diagnoses:  BRBPR (bright red blood per rectum)  External hemorrhoid  Diarrhea, unspecified type   ED Prescriptions    Medication Sig Dispense Auth. Provider   hydrocortisone (ANUSOL-HC) 25 MG suppository Place 1 suppository (25 mg total) rectally 2 (two) times daily. 12 suppository Belinda Fisher, PA-C     PDMP not reviewed this encounter.   Belinda Fisher, PA-C 04/15/19 1526

## 2019-04-16 ENCOUNTER — Other Ambulatory Visit: Payer: Self-pay | Admitting: Nurse Practitioner

## 2019-04-16 DIAGNOSIS — R531 Weakness: Secondary | ICD-10-CM

## 2019-04-16 DIAGNOSIS — R0602 Shortness of breath: Secondary | ICD-10-CM

## 2019-04-16 DIAGNOSIS — Z8616 Personal history of COVID-19: Secondary | ICD-10-CM

## 2019-04-16 DIAGNOSIS — R5383 Other fatigue: Secondary | ICD-10-CM

## 2019-04-18 ENCOUNTER — Ambulatory Visit: Payer: Medicare Other | Attending: Internal Medicine

## 2019-04-18 DIAGNOSIS — Z23 Encounter for immunization: Secondary | ICD-10-CM

## 2019-04-18 NOTE — Progress Notes (Signed)
   Covid-19 Vaccination Clinic  Name:  Thomas Joseph    MRN: [id number] DOB: 16-Jul-1933  04/18/2019  Mr. Thomas Joseph was observed post Covid-19 immunization for 15 minutes without incident. He was provided with Vaccine Information Sheet and instruction to access the V-Safe system.   Mr. Thomas Joseph was instructed to call 911 with any severe reactions post vaccine: Marland Kitchen Difficulty breathing  . Swelling of face and throat  . A fast heartbeat  . A bad rash all over body  . Dizziness and weakness   Immunizations Administered    Name Date Dose VIS Date Route   Pfizer COVID-19 Vaccine 04/18/2019  2:10 PM 0.3 mL 12/22/2018 Intramuscular   Manufacturer: [employer], [employer]   Lot: [id number]   NDC: [address]-5

## 2019-05-03 ENCOUNTER — Encounter: Payer: Self-pay | Admitting: Physical Therapy

## 2019-05-03 ENCOUNTER — Ambulatory Visit: Payer: Medicare Other | Attending: Nurse Practitioner | Admitting: Physical Therapy

## 2019-05-03 ENCOUNTER — Telehealth: Payer: Self-pay | Admitting: Cardiology

## 2019-05-03 ENCOUNTER — Other Ambulatory Visit: Payer: Self-pay

## 2019-05-03 VITALS — BP 152/74 | HR 66

## 2019-05-03 DIAGNOSIS — R0602 Shortness of breath: Secondary | ICD-10-CM | POA: Diagnosis not present

## 2019-05-03 DIAGNOSIS — Z8616 Personal history of COVID-19: Secondary | ICD-10-CM

## 2019-05-03 DIAGNOSIS — M6281 Muscle weakness (generalized): Secondary | ICD-10-CM | POA: Insufficient documentation

## 2019-05-03 DIAGNOSIS — R262 Difficulty in walking, not elsewhere classified: Secondary | ICD-10-CM | POA: Insufficient documentation

## 2019-05-03 NOTE — Progress Notes (Signed)
Cardiology Office Note   Date:  05/04/2019   ID:  [name], DOB [birth year], MRN [id number]  PCP:  Mila Palmer, MD  Cardiologist:   No primary care provider on file. Referring:  Mila Palmer, MD  Chief Complaint  Patient presents with  . Irregular Heart Beat      History of Present Illness: Thomas Joseph is a 86 y.o. male who is referred by Mila Palmer, MD  for evaluation of an irregular heart rate.   He had COVID in Jan.  He had SOB with this.  He is slowly recovering.  He is however, starting to get back to some yard work.  He was able to get wood in the last few days for his wood stove.    He has had palpitations.  I do notice from looking at his primary care referral notes that when they were walking him recently in the office he was noted to have some skipped beats.  He does not recall feeling these.  It was question whether it could be PVCs.  His oxygen saturation was fine.  It was said that his heart rate was 46.  EKG did demonstrate premature atrial contractions.  He does not feel any irregular heartbeat.  He says his breathing is slowly getting better.  He denies any chest pressure, neck or arm discomfort.  He denies any PND or orthopnea.  He has had no weight gain.  He has some chronic mild right lower extremity edema.  Past Medical History:  Diagnosis Date  . Anemia   . BPH (benign prostatic hyperplasia)   . Diabetes mellitus without complication ([facility])   . ED (erectile dysfunction)   . Family history of adverse reaction to anesthesia    son had some problem years ago, pt unsure of what exact problem was  . Hypertension   . Obesity   . Pneumonia     Past Surgical History:  Procedure Laterality Date  .  LEFT KNEE ARTHROCOPY    . HERNIA REPAIR    . TRANSURETHRAL RESECTION OF PROSTATE N/A 12/26/2014   Procedure: TRANSURETHRAL RESECTION OF THE PROSTATE;  Surgeon: Malen Gauze, MD;  Location: [facility];  Service: Urology;  Laterality: N/A;       Current Outpatient Medications  Medication Sig Dispense Refill  . acetaminophen (TYLENOL) 325 MG tablet Take 325-650 mg by mouth every 12 (twelve) hours as needed for mild pain or headache.    Marland Kitchen amLODipine (NORVASC) 10 MG tablet Take 10 mg by mouth daily.    Marland Kitchen aspirin 81 MG chewable tablet Chew 81 mg by mouth every other day.     . cetirizine (ZYRTEC) 10 MG tablet Take 10 mg by mouth daily as needed for allergies.    . hydrocortisone (ANUSOL-HC) 25 MG suppository Place 1 suppository (25 mg total) rectally 2 (two) times daily. 12 suppository 0  . latanoprost (XALATAN) 0.005 % ophthalmic solution Place 1 drop into both eyes at bedtime.    . metFORMIN (GLUCOPHAGE-XR) 500 MG 24 hr tablet Take 1,000 mg by mouth daily.      No current facility-administered medications for this visit.    Allergies:   Hydrochlorothiazide, Lipitor [atorvastatin], and Viagra [sildenafil citrate]    Social History:  The patient  reports that he quit smoking about 34 years ago. He has never used smokeless tobacco. He reports previous alcohol use. He reports that he does not use drugs.   Family History:  The patient's family history  includes CVA in his father; Cancer in his brother; Hypertension in his father; Leukemia in his sister; Thyroid disease in his sister.    ROS:  Please see the history of present illness.   Otherwise, review of systems are positive for none.   All other systems are reviewed and negative.    PHYSICAL EXAM: VS:  BP 134/62 (BP Location: Left Arm, Patient Position: Sitting, Cuff Size: Normal)   Pulse (!) 59   Temp (!) 97.2 F (36.2 C)   Ht 6' (1.829 m)   Wt 230 lb (104.3 kg)   BMI 31.19 kg/m  , BMI Body mass index is 31.19 kg/m. GENERAL:  Well appearing HEENT:  Pupils equal round and reactive, fundi not visualized, oral mucosa unremarkable NECK:  No jugular venous distention, waveform within normal limits, carotid upstroke brisk and symmetric, no bruits, no  thyromegaly LYMPHATICS:  No cervical, inguinal adenopathy LUNGS:  Clear to auscultation bilaterally BACK:  No CVA tenderness CHEST:  Unremarkable HEART:  PMI not displaced or sustained,S1 and S2 within normal limits, no S3, no S4, no clicks, no rubs, no murmurs ABD:  Flat, positive bowel sounds normal in frequency in pitch, no bruits, no rebound, no guarding, no midline pulsatile mass, no hepatomegaly, no splenomegaly EXT:  2 plus pulses throughout, mild right leg edema, no cyanosis no clubbing SKIN:  No rashes no nodules NEURO:  Cranial nerves II through XII grossly intact, motor grossly intact throughout PSYCH:  Cognitively intact, oriented to person place and time    EKG:  EKG is ordered today. The ekg ordered today demonstrates sinus rhythm, rate 59, axis within normal limits, intervals within normal limits, no acute ST-T wave changes.   Recent Labs: 02/07/2019: Hemoglobin 12.7; Magnesium 2.0; Platelets 288 02/08/2019: ALT 33; BUN 32; Creatinine, Ser 1.16; Potassium 4.0; Sodium 135    Lipid Panel    Component Value Date/Time   TRIG 107 02/02/2019 1345      Wt Readings from Last 3 Encounters:  05/04/19 230 lb (104.3 kg)  04/11/19 230 lb 4 oz (104.4 kg)  02/02/19 236 lb (107 kg)      Other studies Reviewed: Additional studies/ records that were reviewed today include: Officer records and hospital records from January. Review of the above records demonstrates:  Please see elsewhere in the note.     ASSESSMENT AND PLAN:  PALPITATIONS: He was apparently having some PACs but I cannot find this EKG.  He is not having any symptoms.  He has a normal EKG.  His exam is unremarkable.  I looked through the hospitalization from January and do not see that there was any suggestion of cardiac involvement.  He did require an echo.  While there can be Covid involvement of the myocardium gets going to be relatively uncommon probably 2% and I do not see a suggestion of this on him.  No  further testing.  HTN: Blood pressures well controlled.  No change in therapy.  DM: A1c is 7.7 and higher since Covid.  I will defer to Mila Palmer, MD  LIPID: I do not see a more recent lipid than September when his LDL was 125.  My goal would be to suggest an LDL at least less than 100 and consideration of a statin but again I will defer to Mila Palmer, MD  COVID EDUCATION:   He has had the vaccine.   Current medicines are reviewed at length with the patient today.  The patient does not have concerns regarding medicines.  The following changes have been made:  no change  Labs/ tests ordered today include: None  Orders Placed This Encounter  Procedures  . EKG 12-Lead     Disposition:   FU with me as needed.     Signed, Minus Breeding, MD  05/04/2019 1:00 PM    [place]

## 2019-05-03 NOTE — Therapy (Signed)
[facility] [address] [place], [place], [address] Phone: [phone]   Fax:  [phone]  Physical Therapy Evaluation  Patient Details  Name: MC BLOODWORTH MRN: [id number] Date of Birth: [birth year] Referring Provider (PT): Angus Seller, NP   Encounter Date: 05/03/2019  PT End of Session - 05/03/19 1123    Visit Number  1    Number of Visits  12    Date for PT Re-Evaluation  06/14/19    Authorization Type  [employer] Medicare, progress note by visit 10    PT Start Time  0928    PT Stop Time  1014    PT Time Calculation (min)  46 min    Activity Tolerance  Patient limited by fatigue    Behavior During Therapy  [facility] for tasks assessed/performed       Past Medical History:  Diagnosis Date  . Anemia   . BPH (benign prostatic hyperplasia)   . Diabetes mellitus without complication (HCC)   . ED (erectile dysfunction)   . Family history of adverse reaction to anesthesia    son had some problem years ago, pt unsure of what exact problem was  . Hypertension   . Obesity   . Pneumonia     Past Surgical History:  Procedure Laterality Date  .  LEFT KNEE ARTHROCOPY    . HERNIA REPAIR    . TRANSURETHRAL RESECTION OF PROSTATE N/A 12/26/2014   Procedure: TRANSURETHRAL RESECTION OF THE PROSTATE;  Surgeon: Malen Gauze, MD;  Location: [facility];  Service: Urology;  Laterality: N/A;    Vitals:   05/03/19 0937  BP: (!) 152/74  Pulse: 66  SpO2: 99%     Subjective Assessment - 05/03/19 1018    Subjective  Pt. is a 86 y/o male referred to PT for issues with weakness and fatigue/limited activity tolerance s/p Covid infection in [place] in which he was hospitalized from 02/02/19-02/08/19 with subsequent d/c home. Pt. reports limited activity tolerance with difficulty for chores and yardwork and has had more difficulty walking with limited walking tolerance. Of note he is also pending visit with cardiologist tomorrow for irregular  heartbeat noted at last office visit with referring provider.    Pertinent History  Diabetic, Covid hospitalization 02/02/19-02/08/19    Limitations  Lifting;House hold activities;Standing;Walking    Patient Stated Goals  Improve activity tolerance    Currently in Pain?  No/denies         [facility] PT Assessment - 05/03/19 0001      Assessment   Medical Diagnosis  History of Covid 19, weakness, fatigue, SOB    Referring Provider (PT)  Angus Seller, NP    Onset Date/Surgical Date  02/02/19    Hand Dominance  Right    Next MD Visit  --   cardiology visit tomorrow   Prior Therapy  none      Precautions   Precautions  None      Restrictions   Weight Bearing Restrictions  No      Balance Screen   Has the patient fallen in the past 6 months  No      Home Environment   Living Environment  Private residence    Living Arrangements  Spouse/significant other;Children    Type of Home  House    Home Access  --   no stairs   Home Layout  One level    Home Equipment  [place] - single point      Prior Function  Level of Independence  Independent with basic ADLs;Independent with community mobility without device      Cognition   Overall Cognitive Status  Within Functional Limits for tasks assessed      Observation/Other Assessments   Focus on Therapeutic Outcomes (FOTO)   --   not assessed     Posture/Postural Control   Posture Comments  Rounded shoulders      ROM / Strength   AROM / PROM / Strength  AROM;Strength      AROM   Overall AROM Comments  Bilat. UE/LE AROM grossly [facility]      Strength   Strength Assessment Site  Shoulder;Elbow;Wrist;Hip;Knee;Ankle    Right/Left Shoulder  Right;Left    Right Shoulder Flexion  5/5    Right Shoulder ABduction  5/5    Right Shoulder Internal Rotation  5/5    Right Shoulder External Rotation  5/5    Left Shoulder Flexion  4+/5    Left Shoulder ABduction  4+/5    Left Shoulder Internal Rotation  5/5    Left Shoulder External Rotation  4+/5     Right/Left Elbow  Right;Left    Right Elbow Flexion  5/5    Right Elbow Extension  5/5    Left Elbow Flexion  5/5    Left Elbow Extension  4+/5    Right/Left Wrist  Right;Left    Right Wrist Flexion  5/5    Right Wrist Extension  5/5    Left Wrist Flexion  5/5    Left Wrist Extension  5/5    Right/Left Hip  Right;Left    Right Hip Flexion  4/5    Right Hip External Rotation   4/5    Right Hip Internal Rotation  4/5    Left Hip Flexion  4/5    Left Hip External Rotation  4/5    Left Hip Internal Rotation  4/5    Right/Left Knee  Right;Left    Right Knee Flexion  5/5    Right Knee Extension  4+/5    Left Knee Flexion  5/5    Left Knee Extension  4+/5    Right/Left Ankle  Right;Left    Right Ankle Dorsiflexion  4+/5    Right Ankle Inversion  4/5    Right Ankle Eversion  4/5    Left Ankle Dorsiflexion  4+/5    Left Ankle Inversion  4/5    Left Ankle Eversion  4/5      Transfers   Five time sit to stand comments   28 seconds      Ambulation/Gait   Gait Comments  Pt. ambulates independently without AD with decreased bilateral steplength, decreased arm swing and decreased cadence      6 minute walk test results    Aerobic Endurance Distance Walked  595      Balance   Balance Assessed  Yes   SLS: Unable bilat.     Standardized Balance Assessment   Standardized Balance Assessment  Berg Balance Test      Berg Balance Test   Sit to Stand  Able to stand  independently using hands    Standing Unsupported  Able to stand safely 2 minutes    Sitting with Back Unsupported but Feet Supported on Floor or Stool  Able to sit safely and securely 2 minutes    Stand to Sit  Controls descent by using hands    Transfers  Able to transfer safely, definite need of hands  Standing Unsupported with Eyes Closed  Able to stand 10 seconds safely    Standing Unsupported with Feet Together  Able to place feet together independently and stand for 1 minute with supervision    From Standing,  Reach Forward with Outstretched Arm  Can reach confidently >25 cm (10")    From Standing Position, Pick up Object from Floor  Able to pick up shoe safely and easily    From Standing Position, Turn to Look Behind Over each Shoulder  Looks behind from both sides and weight shifts well    Turn 360 Degrees  Able to turn 360 degrees safely but slowly    Standing Unsupported, Alternately Place Feet on Step/Stool  Able to complete 4 steps without aid or supervision    Standing Unsupported, One Foot in Front  Able to plae foot ahead of the other independently and hold 30 seconds    Standing on One Leg  Tries to lift leg/unable to hold 3 seconds but remains standing independently    Total Score  44                Objective measurements completed on examination: See above findings.      [facility] Adult PT Treatment/Exercise - 05/03/19 0001      Exercises   Exercises  --   brief HEP handout review            PT Education - 05/03/19 1123    Education Details  POC, eval findings, HEP    Person(s) Educated  Patient    Methods  Explanation;Demonstration;Verbal cues;Handout    Comprehension  Verbalized understanding       PT Short Term Goals - 05/03/19 1130      PT SHORT TERM GOAL #1   Title  Improve 5xsit<>stand time to 20 sec or less as demonstration of improved leg strength to assist transfers and stair navigation    Baseline  28 sec    Time  3    Period  Weeks    Status  New    Target Date  05/24/19      PT SHORT TERM GOAL #2   Title  Independent with initial HEP    Time  3    Period  Weeks    Status  New    Target Date  05/24/19        PT Long Term Goals - 05/03/19 1129      PT LONG TERM GOAL #1   Title  Independent with advanced HEP for independent exercise progression after therapy d/c    Baseline  basic HEP at eval, will continue to update    Time  6    Period  Weeks    Status  New    Target Date  06/14/19      PT LONG TERM GOAL #2   Title  Improve 6  min walk test distance by 100 feet or greater to improve tolerance for community mobility for activities such as grocery shopping and yardwork    Baseline  595 feet    Time  6    Period  Weeks    Status  New    Target Date  06/14/19      PT LONG TERM GOAL #3   Title  Increase LE strength at leats grossly 1/2 MMT grade to improve ability for transfers from low seats    Baseline  see objective    Time  6    Period  Weeks  Status  New    Target Date  06/14/19      PT LONG TERM GOAL #4   Title  Improve Berg Balance Test score at least 5 points to work towards decreased fall risk    Baseline  44/56    Time  6    Period  Weeks    Status  New    Target Date  06/14/19             Plan - 05/03/19 1124    Clinical Impression Statement  Pt. presents with continued muscle weakness and decreased functional activity tolerance 3 months s/p Covid 19 infection and subsequent hospitalization. Eval findings include LE>UE general weakness, decreased balance, and limited cardiorespiratory endurance per 6 min walk test with associated decreased activity tolerance. Pt. would benefit from PT to help address current deficits to improve aiblity and tolerance for functional mobility.    Personal Factors and Comorbidities  Age;Comorbidity 2    Comorbidities  Diabetic, recent Covid infection    Examination-Activity Limitations  Lift;Squat;Locomotion Level;Stairs;Stand    Examination-Participation Restrictions  Community Activity;Shop;Yard Work;Cleaning    Stability/Clinical Decision Making  Stable/Uncomplicated    Clinical Decision Making  Low    Rehab Potential  Good    PT Frequency  2x / week    PT Duration  6 weeks    PT Treatment/Interventions  ADLs/Self Care Home Management;Cryotherapy;Moist Heat;Manual techniques;Patient/family education;Therapeutic exercise;Balance training;Gait training;Therapeutic activities;Functional mobility training;Neuromuscular re-education;Energy conservation;Stair  training    PT Next Visit Plan  NUSTEP warm up, try partial squats at counter, step ups, Airex heel raises and marches, general LE/UE strengthening and balance work, gait training for increased steplength and arm swing    PT [place]: brief HEP at eval for sit<>stands, heel raises and hip abd SLR in standing-will add/update as appropriate at future sessions       Patient will benefit from skilled therapeutic intervention in order to improve the following deficits and impairments:  Abnormal gait, Decreased balance, Decreased endurance, Difficulty walking, Decreased activity tolerance, Decreased strength, Postural dysfunction  Visit Diagnosis: History of 2019 novel coronavirus disease (COVID-19) - Plan: PT plan of care cert/re-cert  Muscle weakness (generalized) - Plan: PT plan of care cert/re-cert  Difficulty in walking, not elsewhere classified - Plan: PT plan of care cert/re-cert     Problem List Patient Active Problem List   Diagnosis Date Noted  . History of COVID-19 04/11/2019  . Shortness of breath 04/11/2019  . Irregular heart rhythm 04/11/2019  . Fatigue 04/11/2019  . Generalized weakness 04/11/2019  . Acute respiratory failure with hypoxia ([place]) 02/02/2019  . Essential hypertension 02/02/2019  . Type 2 diabetes mellitus with complication, without long-term current use of insulin ([place]) 02/02/2019  . Hypokalemia 02/02/2019  . BPH (benign prostatic hyperplasia) 12/26/2014    Beaulah Dinning, PT, DPT 05/03/19 11:38 AM  [facility] [address] [place], [place], [address] Phone: [phone]   Fax:  [phone]  Name: DELRON COMER MRN: [id number] Date of Birth: 07/09/1933

## 2019-05-03 NOTE — Telephone Encounter (Signed)
Patient's wife states she is requesting to accompany the patient during his appointment scheduled for 05/04/19 with Dr. Antoine Poche in order to assist him with his paperwork. She states she will leave the office after assisting him. Please advise.

## 2019-05-03 NOTE — Therapy (Signed)
[facility] Outpatient Rehabilitation [facility] [address] [place], [place], [address] Phone: [phone]   Fax:  [phone]  Physical Therapy Evaluation  Patient Details  Name: Thomas Joseph MRN: [id number] Date of Birth: 1933-04-16 Referring Provider (PT): Angus Seller, NP   Encounter Date: 05/03/2019  PT End of Session - 05/03/19 1123    Visit Number  1    Number of Visits  12    Date for PT Re-Evaluation  06/14/19    Authorization Type  [employer] Medicare, progress note by visit 10    PT Start Time  0928    PT Stop Time  1014    PT Time Calculation (min)  46 min    Activity Tolerance  Patient limited by fatigue    Behavior During Therapy  [facility] for tasks assessed/performed       Past Medical History:  Diagnosis Date  . Anemia   . BPH (benign prostatic hyperplasia)   . Diabetes mellitus without complication (HCC)   . ED (erectile dysfunction)   . Family history of adverse reaction to anesthesia    son had some problem years ago, pt unsure of what exact problem was  . Hypertension   . Obesity   . Pneumonia     Past Surgical History:  Procedure Laterality Date  .  LEFT KNEE ARTHROCOPY    . HERNIA REPAIR    . TRANSURETHRAL RESECTION OF PROSTATE N/A 12/26/2014   Procedure: TRANSURETHRAL RESECTION OF THE PROSTATE;  Surgeon: Malen Gauze, MD;  Location: [facility];  Service: Urology;  Laterality: N/A;    Vitals:   05/03/19 0937  BP: (!) 152/74  Pulse: 66  SpO2: 99%     Subjective Assessment - 05/03/19 1018    Subjective  Pt. is a 86 y/o male referred to PT for issues with weakness and fatigue/limited activity tolerance s/p Covid infection in [place] in which he was hospitalized from 02/02/19-02/08/19 with subsequent d/c home. Pt. reports limited activity tolerance with difficulty for chores and yardwork and has had more difficulty walking with limited walking tolerance. Of note he is also pending visit with cardiologist tomorrow for irregular  heartbeat noted at last office visit with referring provider.    Pertinent History  Diabetic, Covid hospitalization 02/02/19-02/08/19    Limitations  Lifting;House hold activities;Standing;Walking    Patient Stated Goals  Improve activity tolerance    Currently in Pain?  No/denies         [facility] PT Assessment - 05/03/19 0001      Assessment   Medical Diagnosis  History of Covid 19, weakness, fatigue, SOB    Referring Provider (PT)  Angus Seller, NP    Onset Date/Surgical Date  02/02/19    Hand Dominance  Right    Next MD Visit  --   cardiology visit tomorrow   Prior Therapy  none      Precautions   Precautions  None      Restrictions   Weight Bearing Restrictions  No      Balance Screen   Has the patient fallen in the past 6 months  No      Home Environment   Living Environment  Private residence    Living Arrangements  Spouse/significant other;Children    Type of Home  House    Home Access  --   no stairs   Home Layout  One level    Home Equipment  [place] - single point      Prior Function  Level of Independence  Independent with basic ADLs;Independent with community mobility without device      Cognition   Overall Cognitive Status  Within Functional Limits for tasks assessed      Observation/Other Assessments   Focus on Therapeutic Outcomes (FOTO)   --   not assessed     Posture/Postural Control   Posture Comments  Rounded shoulders      ROM / Strength   AROM / PROM / Strength  AROM;Strength      AROM   Overall AROM Comments  Bilat. UE/LE AROM grossly [facility]      Strength   Strength Assessment Site  Shoulder;Elbow;Wrist;Hip;Knee;Ankle    Right/Left Shoulder  Right;Left    Right Shoulder Flexion  5/5    Right Shoulder ABduction  5/5    Right Shoulder Internal Rotation  5/5    Right Shoulder External Rotation  5/5    Left Shoulder Flexion  4+/5    Left Shoulder ABduction  4+/5    Left Shoulder Internal Rotation  5/5    Left Shoulder External Rotation  4+/5     Right/Left Elbow  Right;Left    Right Elbow Flexion  5/5    Right Elbow Extension  5/5    Left Elbow Flexion  5/5    Left Elbow Extension  4+/5    Right/Left Wrist  Right;Left    Right Wrist Flexion  5/5    Right Wrist Extension  5/5    Left Wrist Flexion  5/5    Left Wrist Extension  5/5    Right/Left Hip  Right;Left    Right Hip Flexion  4/5    Right Hip External Rotation   4/5    Right Hip Internal Rotation  4/5    Left Hip Flexion  4/5    Left Hip External Rotation  4/5    Left Hip Internal Rotation  4/5    Right/Left Knee  Right;Left    Right Knee Flexion  5/5    Right Knee Extension  4+/5    Left Knee Flexion  5/5    Left Knee Extension  4+/5    Right/Left Ankle  Right;Left    Right Ankle Dorsiflexion  4+/5    Right Ankle Inversion  4/5    Right Ankle Eversion  4/5    Left Ankle Dorsiflexion  4+/5    Left Ankle Inversion  4/5    Left Ankle Eversion  4/5      Transfers   Five time sit to stand comments   28 seconds      Ambulation/Gait   Gait Comments  Pt. ambulates independently without AD with decreased bilateral steplength, decreased arm swing and decreased cadence      6 minute walk test results    Aerobic Endurance Distance Walked  595      Balance   Balance Assessed  Yes   SLS: Unable bilat.     Standardized Balance Assessment   Standardized Balance Assessment  Berg Balance Test      Berg Balance Test   Sit to Stand  Able to stand  independently using hands    Standing Unsupported  Able to stand safely 2 minutes    Sitting with Back Unsupported but Feet Supported on Floor or Stool  Able to sit safely and securely 2 minutes    Stand to Sit  Controls descent by using hands    Transfers  Able to transfer safely, definite need of hands  Standing Unsupported with Eyes Closed  Able to stand 10 seconds safely    Standing Unsupported with Feet Together  Able to place feet together independently and stand for 1 minute with supervision    From Standing,  Reach Forward with Outstretched Arm  Can reach confidently >25 cm (10")    From Standing Position, Pick up Object from Floor  Able to pick up shoe safely and easily    From Standing Position, Turn to Look Behind Over each Shoulder  Looks behind from both sides and weight shifts well    Turn 360 Degrees  Able to turn 360 degrees safely but slowly    Standing Unsupported, Alternately Place Feet on Step/Stool  Able to complete 4 steps without aid or supervision    Standing Unsupported, One Foot in Front  Able to plae foot ahead of the other independently and hold 30 seconds    Standing on One Leg  Tries to lift leg/unable to hold 3 seconds but remains standing independently    Total Score  44                Objective measurements completed on examination: See above findings.      [facility] Adult PT Treatment/Exercise - 05/03/19 0001      Exercises   Exercises  --   brief HEP handout review            PT Education - 05/03/19 1123    Education Details  POC, eval findings, HEP    Person(s) Educated  Patient    Methods  Explanation;Demonstration;Verbal cues;Handout    Comprehension  Verbalized understanding       PT Short Term Goals - 05/03/19 1130      PT SHORT TERM GOAL #1   Title  Improve 5xsit<>stand time to 20 sec or less as demonstration of improved leg strength to assist transfers and stair navigation    Baseline  28 sec    Time  3    Period  Weeks    Status  New    Target Date  05/24/19      PT SHORT TERM GOAL #2   Title  Independent with initial HEP    Time  3    Period  Weeks    Status  New    Target Date  05/24/19        PT Long Term Goals - 05/03/19 1129      PT LONG TERM GOAL #1   Title  Independent with advanced HEP for independent exercise progression after therapy d/c    Baseline  basic HEP at eval, will continue to update    Time  6    Period  Weeks    Status  New    Target Date  06/14/19      PT LONG TERM GOAL #2   Title  Improve 6  min walk test distance by 100 feet or greater to improve tolerance for community mobility for activities such as grocery shopping and yardwork    Baseline  595 feet    Time  6    Period  Weeks    Status  New    Target Date  06/14/19      PT LONG TERM GOAL #3   Title  Increase LE strength at leats grossly 1/2 MMT grade to improve ability for transfers from low seats    Baseline  see objective    Time  6    Period  Weeks  Status  New    Target Date  06/14/19      PT LONG TERM GOAL #4   Title  Improve Berg Balance Test score at least 5 points to work towards decreased fall risk    Baseline  44/56    Time  6    Period  Weeks    Status  New    Target Date  06/14/19             Plan - 05/03/19 1124    Clinical Impression Statement  Pt. presents with continued muscle weakness and decreased functional activity tolerance 3 months s/p Covid 19 infection and subsequent hospitalization. Eval findings include LE>UE general weakness, decreased balance, and limited cardiorespiratory endurance per 6 min walk test with associated decreased activity tolerance. Pt. would benefit from PT to help address current deficits to improve aiblity and tolerance for functional mobility.    Personal Factors and Comorbidities  Age;Comorbidity 2    Comorbidities  Diabetic, recent Covid infection    Examination-Activity Limitations  Lift;Squat;Locomotion Level;Stairs;Stand    Examination-Participation Restrictions  Community Activity;Shop;Yard Work;Cleaning    Stability/Clinical Decision Making  Stable/Uncomplicated    Clinical Decision Making  Low    Rehab Potential  Good    PT Frequency  2x / week    PT Duration  6 weeks    PT Treatment/Interventions  ADLs/Self Care Home Management;Cryotherapy;Moist Heat;Manual techniques;Patient/family education;Therapeutic exercise;Balance training;Gait training;Therapeutic activities;Functional mobility training;Neuromuscular re-education;Energy conservation;Stair  training    PT Next Visit Plan  NUSTEP warm up, try partial squats at counter, step ups, Airex heel raises and marches, general LE/UE strengthening and balance work, gait training for increased steplength and arm swing    PT Home Exercise Plan  brief HEP at eval for sit<>stands, heel raises and hip abd SLR in standing-will add/update as appropriate at future sessions       Patient will benefit from skilled therapeutic intervention in order to improve the following deficits and impairments:  Abnormal gait, Decreased balance, Decreased endurance, Difficulty walking, Decreased activity tolerance, Decreased strength, Postural dysfunction  Visit Diagnosis: History of 2019 novel coronavirus disease (COVID-19)  Muscle weakness (generalized)  Difficulty in walking, not elsewhere classified     Problem List Patient Active Problem List   Diagnosis Date Noted  . History of COVID-19 04/11/2019  . Shortness of breath 04/11/2019  . Irregular heart rhythm 04/11/2019  . Fatigue 04/11/2019  . Generalized weakness 04/11/2019  . Acute respiratory failure with hypoxia ([place]) 02/02/2019  . Essential hypertension 02/02/2019  . Type 2 diabetes mellitus with complication, without long-term current use of insulin ([place]) 02/02/2019  . Hypokalemia 02/02/2019  . BPH (benign prostatic hyperplasia) 12/26/2014    Beaulah Dinning, PT, DPT 05/03/19 11:36 AM  [place] [facility] [address] [place], [place], [address] Phone: [phone]   Fax:  [phone]  Name: Thomas Joseph MRN: [id number] Date of Birth: [birth year]

## 2019-05-04 ENCOUNTER — Encounter: Payer: Self-pay | Admitting: Cardiology

## 2019-05-04 ENCOUNTER — Ambulatory Visit: Payer: Medicare Other | Admitting: Cardiology

## 2019-05-04 VITALS — BP 134/62 | HR 59 | Temp 97.2°F | Ht 72.0 in | Wt 230.0 lb

## 2019-05-04 DIAGNOSIS — R002 Palpitations: Secondary | ICD-10-CM | POA: Diagnosis not present

## 2019-05-04 DIAGNOSIS — I10 Essential (primary) hypertension: Secondary | ICD-10-CM

## 2019-05-04 DIAGNOSIS — Z7189 Other specified counseling: Secondary | ICD-10-CM | POA: Diagnosis not present

## 2019-05-04 NOTE — Patient Instructions (Signed)
Medication Instructions:  No changes *If you need a refill on your cardiac medications before your next appointment, please call your pharmacy*  Lab Work: None ordered this visit  Testing/Procedures: None ordered this visit  Follow-Up: At [facility], you and your health needs are our priority.  As part of our continuing mission to provide you with exceptional heart care, we have created designated Provider Care Teams.  These Care Teams include your primary Cardiologist (physician) and Advanced Practice Providers (APPs -  Physician Assistants and Nurse Practitioners) who all work together to provide you with the care you need, when you need it.    Your next appointment:   Follow up as needed 

## 2019-05-16 ENCOUNTER — Ambulatory Visit: Payer: Medicare Other | Attending: Nurse Practitioner | Admitting: Physical Therapy

## 2019-05-16 ENCOUNTER — Other Ambulatory Visit: Payer: Self-pay

## 2019-05-16 ENCOUNTER — Encounter: Payer: Self-pay | Admitting: Physical Therapy

## 2019-05-16 VITALS — HR 60

## 2019-05-16 DIAGNOSIS — M6281 Muscle weakness (generalized): Secondary | ICD-10-CM | POA: Diagnosis present

## 2019-05-16 DIAGNOSIS — Z8616 Personal history of COVID-19: Secondary | ICD-10-CM | POA: Diagnosis not present

## 2019-05-16 DIAGNOSIS — R262 Difficulty in walking, not elsewhere classified: Secondary | ICD-10-CM | POA: Insufficient documentation

## 2019-05-16 NOTE — Therapy (Signed)
[facility] [address] [place], [place], [address] Phone: [phone]   Fax:  [phone]  Physical Therapy Treatment  Patient Details  Name: Thomas Joseph MRN: [id number] Date of Birth: 01-Aug-1933 Referring Provider (PT): Angus Seller, NP   Encounter Date: 05/16/2019  PT End of Session - 05/16/19 1037    Visit Number  2    Number of Visits  12    Date for PT Re-Evaluation  06/14/19    Authorization Type  [employer] Medicare, progress note by visit 10    PT Start Time  1014    PT Stop Time  1054    PT Time Calculation (min)  40 min    Activity Tolerance  Patient tolerated treatment well    Behavior During Therapy  [facility] for tasks assessed/performed       Past Medical History:  Diagnosis Date  . Anemia   . BPH (benign prostatic hyperplasia)   . Diabetes mellitus without complication (HCC)   . ED (erectile dysfunction)   . Family history of adverse reaction to anesthesia    son had some problem years ago, pt unsure of what exact problem was  . Hypertension   . Obesity   . Pneumonia     Past Surgical History:  Procedure Laterality Date  .  LEFT KNEE ARTHROCOPY    . HERNIA REPAIR    . TRANSURETHRAL RESECTION OF PROSTATE N/A 12/26/2014   Procedure: TRANSURETHRAL RESECTION OF THE PROSTATE;  Surgeon: Malen Gauze, MD;  Location: [facility];  Service: Urology;  Laterality: N/A;    Vitals:   05/16/19 1023  Pulse: 60  SpO2: 96%    Subjective Assessment - 05/16/19 1021    Subjective  No pain pre-tx. Fatigue has still been an issue limiting activity tolerance such as for yardwork.    Currently in Pain?  No/denies                       [facility] Adult PT Treatment/Exercise - 05/16/19 0001      Exercises   Exercises  Shoulder;Knee/Hip;Ankle      Knee/Hip Exercises: Aerobic   Recumbent Bike  L1 x 5 min   at end of tx., monitoring/assist to keep feet on pedals   Nustep  L2 x 5 min UE/LE      Knee/Hip  Exercises: Standing   Heel Raises  Both;2 sets;10 reps    Heel Raises Limitations  on Airex    Hip Flexion Limitations  Airex marches x 20 reps    Forward Step Up  Right;Left;10 reps;Hand Hold: 2;Step Height: 6"    Functional Squat Limitations  partial squat at counter x 15 reps    Rocker Board  2 minutes    Rocker Board Limitations  1 min ea. dynamic balance fw/rev and lateral shifts    Other Standing Knee Exercises  Romberg eyes closed at counter on Airex 20 sec x 3 feet together      Shoulder Exercises: ROM/Strengthening   Cybex Press Limitations  20 lbs. 2x10 vertical grips    Cybex Row Limitations  25 lbs. 2x10 vertical grips               PT Short Term Goals - 05/03/19 1130      PT SHORT TERM GOAL #1   Title  Improve 5xsit<>stand time to 20 sec or less as demonstration of improved leg strength to assist transfers and stair navigation    Baseline  28 sec    Time  3    Period  Weeks    Status  New    Target Date  05/24/19      PT SHORT TERM GOAL #2   Title  Independent with initial HEP    Time  3    Period  Weeks    Status  New    Target Date  05/24/19        PT Long Term Goals - 05/03/19 1129      PT LONG TERM GOAL #1   Title  Independent with advanced HEP for independent exercise progression after therapy d/c    Baseline  basic HEP at eval, will continue to update    Time  6    Period  Weeks    Status  New    Target Date  06/14/19      PT LONG TERM GOAL #2   Title  Improve 6 min walk test distance by 100 feet or greater to improve tolerance for community mobility for activities such as grocery shopping and yardwork    Baseline  595 feet    Time  6    Period  Weeks    Status  New    Target Date  06/14/19      PT LONG TERM GOAL #3   Title  Increase LE strength at leats grossly 1/2 MMT grade to improve ability for transfers from low seats    Baseline  see objective    Time  6    Period  Weeks    Status  New    Target Date  06/14/19      PT LONG  TERM GOAL #4   Title  Improve Berg Balance Test score at least 5 points to work towards decreased fall risk    Baseline  44/56    Time  6    Period  Weeks    Status  New    Target Date  06/14/19            Plan - 05/16/19 1037    Clinical Impression Statement  Pt. returns for first follow up tx. after eval. Worked on general strengthening/conditioning and included work on balance/proprioceptive challenges given fall risk. Session well tolerated with minimal rests reqiured and vital maintained within acceptable limits. Pt. would benefit from continued PT for continued work on building functional activity tolerance s/p Covid infection.    Personal Factors and Comorbidities  Age;Comorbidity 2    Comorbidities  Diabetic, recent Covid infection    Examination-Activity Limitations  Lift;Squat;Locomotion Level;Stairs;Stand    Examination-Participation Restrictions  Community Activity;Shop;Yard Work;Cleaning    Stability/Clinical Decision Making  Stable/Uncomplicated    Clinical Decision Making  Low    Rehab Potential  Good    PT Frequency  2x / week    PT Duration  6 weeks    PT Treatment/Interventions  ADLs/Self Care Home Management;Cryotherapy;Moist Heat;Manual techniques;Patient/family education;Therapeutic exercise;Balance training;Gait training;Therapeutic activities;Functional mobility training;Neuromuscular re-education;Energy conservation;Stair training    PT Next Visit Plan  NUSTEP warm up, partial squats at counter, step ups, Airex heel raises and marches, general LE/UE strengthening and balance work, gait training for increased steplength and arm swing    PT [place]: brief HEP at eval for sit<>stands, heel raises and hip abd SLR in standing-will add/update as appropriate at future sessions    Consulted and Agree with Plan of Care  Patient       Patient will benefit  from skilled therapeutic intervention in order to improve the following deficits and  impairments:  Abnormal gait, Decreased balance, Decreased endurance, Difficulty walking, Decreased activity tolerance, Decreased strength, Postural dysfunction  Visit Diagnosis: History of 2019 novel coronavirus disease (COVID-19)  Muscle weakness (generalized)  Difficulty in walking, not elsewhere classified     Problem List Patient Active Problem List   Diagnosis Date Noted  . History of COVID-19 04/11/2019  . Shortness of breath 04/11/2019  . Irregular heart rhythm 04/11/2019  . Fatigue 04/11/2019  . Generalized weakness 04/11/2019  . Acute respiratory failure with hypoxia (HCC) 02/02/2019  . Essential hypertension 02/02/2019  . Type 2 diabetes mellitus with complication, without long-term current use of insulin (HCC) 02/02/2019  . Hypokalemia 02/02/2019  . BPH (benign prostatic hyperplasia) 12/26/2014    Lazarus Gowda, PT, DPT 05/16/19 10:55 AM  [facility] [address] [place], [place], [address] Phone: [phone]   Fax:  [phone]  Name: Thomas Joseph MRN: [id number] Date of Birth: [birth year]

## 2019-05-18 ENCOUNTER — Ambulatory Visit: Payer: Medicare Other | Admitting: Physical Therapy

## 2019-05-18 ENCOUNTER — Encounter: Payer: Self-pay | Admitting: Physical Therapy

## 2019-05-18 ENCOUNTER — Other Ambulatory Visit: Payer: Self-pay

## 2019-05-18 ENCOUNTER — Encounter: Payer: Self-pay | Admitting: Podiatry

## 2019-05-18 ENCOUNTER — Ambulatory Visit: Payer: Medicare Other | Admitting: Podiatry

## 2019-05-18 VITALS — HR 75

## 2019-05-18 VITALS — Temp 98.1°F

## 2019-05-18 DIAGNOSIS — M79675 Pain in left toe(s): Secondary | ICD-10-CM

## 2019-05-18 DIAGNOSIS — B351 Tinea unguium: Secondary | ICD-10-CM | POA: Diagnosis not present

## 2019-05-18 DIAGNOSIS — M6281 Muscle weakness (generalized): Secondary | ICD-10-CM

## 2019-05-18 DIAGNOSIS — R262 Difficulty in walking, not elsewhere classified: Secondary | ICD-10-CM

## 2019-05-18 DIAGNOSIS — Z8616 Personal history of COVID-19: Secondary | ICD-10-CM

## 2019-05-18 DIAGNOSIS — M79674 Pain in right toe(s): Secondary | ICD-10-CM | POA: Diagnosis not present

## 2019-05-18 DIAGNOSIS — E1151 Type 2 diabetes mellitus with diabetic peripheral angiopathy without gangrene: Secondary | ICD-10-CM | POA: Diagnosis not present

## 2019-05-18 NOTE — Patient Instructions (Signed)
Diabetes Mellitus and Foot Care Foot care is an important part of your health, especially when you have diabetes. Diabetes may cause you to have problems because of poor blood flow (circulation) to your feet and legs, which can cause your skin to:  Become thinner and drier.  Break more easily.  Heal more slowly.  Peel and crack. You may also have nerve damage (neuropathy) in your legs and feet, causing decreased feeling in them. This means that you may not notice minor injuries to your feet that could lead to more serious problems. Noticing and addressing any potential problems early is the best way to prevent future foot problems. How to care for your feet Foot hygiene  Wash your feet daily with warm water and mild soap. Do not use hot water. Then, pat your feet and the areas between your toes until they are completely dry. Do not soak your feet as this can dry your skin.  Trim your toenails straight across. Do not dig under them or around the cuticle. File the edges of your nails with an emery board or nail file.  Apply a moisturizing lotion or petroleum jelly to the skin on your feet and to dry, brittle toenails. Use lotion that does not contain alcohol and is unscented. Do not apply lotion between your toes. Shoes and socks  Wear clean socks or stockings every day. Make sure they are not too tight. Do not wear knee-high stockings since they may decrease blood flow to your legs.  Wear shoes that fit properly and have enough cushioning. Always look in your shoes before you put them on to be sure there are no objects inside.  To break in new shoes, wear them for just a few hours a day. This prevents injuries on your feet. Wounds, scrapes, corns, and calluses  Check your feet daily for blisters, cuts, bruises, sores, and redness. If you cannot see the bottom of your feet, use a mirror or ask someone for help.  Do not cut corns or calluses or try to remove them with medicine.  If you  find a minor scrape, cut, or break in the skin on your feet, keep it and the skin around it clean and dry. You may clean these areas with mild soap and water. Do not clean the area with peroxide, alcohol, or iodine.  If you have a wound, scrape, corn, or callus on your foot, look at it several times a day to make sure it is healing and not infected. Check for: ? Redness, swelling, or pain. ? Fluid or blood. ? Warmth. ? Pus or a bad smell. General instructions  Do not cross your legs. This may decrease blood flow to your feet.  Do not use heating pads or hot water bottles on your feet. They may burn your skin. If you have lost feeling in your feet or legs, you may not know this is happening until it is too late.  Protect your feet from hot and cold by wearing shoes, such as at the beach or on hot pavement.  Schedule a complete foot exam at least once a year (annually) or more often if you have foot problems. If you have foot problems, report any cuts, sores, or bruises to your health care provider immediately. Contact a health care provider if:  You have a medical condition that increases your risk of infection and you have any cuts, sores, or bruises on your feet.  You have an injury that is not   healing.  You have redness on your legs or feet.  You feel burning or tingling in your legs or feet.  You have pain or cramps in your legs and feet.  Your legs or feet are numb.  Your feet always feel cold.  You have pain around a toenail. Get help right away if:  You have a wound, scrape, corn, or callus on your foot and: ? You have pain, swelling, or redness that gets worse. ? You have fluid or blood coming from the wound, scrape, corn, or callus. ? Your wound, scrape, corn, or callus feels warm to the touch. ? You have pus or a bad smell coming from the wound, scrape, corn, or callus. ? You have a fever. ? You have a red line going up your leg. Summary  Check your feet every day  for cuts, sores, red spots, swelling, and blisters.  Moisturize feet and legs daily.  Wear shoes that fit properly and have enough cushioning.  If you have foot problems, report any cuts, sores, or bruises to your health care provider immediately.  Schedule a complete foot exam at least once a year (annually) or more often if you have foot problems. This information is not intended to replace advice given to you by your health care provider. Make sure you discuss any questions you have with your health care provider. Document Revised: 09/20/2018 Document Reviewed: 01/30/2016 Elsevier Patient Education  2020 [employer]  Peripheral Neuropathy Peripheral neuropathy is a type of nerve damage. It affects nerves that carry signals between the spinal cord and the arms, legs, and the rest of the body (peripheral nerves). It does not affect nerves in the spinal cord or brain. In peripheral neuropathy, one nerve or a group of nerves may be damaged. Peripheral neuropathy is a broad category that includes many specific nerve disorders, like diabetic neuropathy, hereditary neuropathy, and carpal tunnel syndrome. What are the causes? This condition may be caused by:  Diabetes. This is the most common cause of peripheral neuropathy.  Nerve injury.  Pressure or stress on a nerve that lasts a long time.  Lack (deficiency) of B vitamins. This can result from alcoholism, poor diet, or a restricted diet.  Infections.  Autoimmune diseases, such as rheumatoid arthritis and systemic lupus erythematosus.  Nerve diseases that are passed from parent to child (inherited).  Some medicines, such as cancer medicines (chemotherapy).  Poisonous (toxic) substances, such as lead and mercury.  Too little blood flowing to the legs.  Kidney disease.  Thyroid disease. In some cases, the cause of this condition is not known. What are the signs or symptoms? Symptoms of this condition depend on which of your  nerves is damaged. Common symptoms include:  Loss of feeling (numbness) in the feet, hands, or both.  Tingling in the feet, hands, or both.  Burning pain.  Very sensitive skin.  Weakness.  Not being able to move a part of the body (paralysis).  Muscle twitching.  Clumsiness or poor coordination.  Loss of balance.  Not being able to control your bladder.  Feeling dizzy.  Sexual problems. How is this diagnosed? Diagnosing and finding the cause of peripheral neuropathy can be difficult. Your health care provider will take your medical history and do a physical exam. A neurological exam will also be done. This involves checking things that are affected by your brain, spinal cord, and nerves (nervous system). For example, your health care provider will check your reflexes, how you move, and   what you can feel. You may have other tests, such as:  Blood tests.  Electromyogram (EMG) and nerve conduction tests. These tests check nerve function and how well the nerves are controlling the muscles.  Imaging tests, such as CT scans or MRI to rule out other causes of your symptoms.  Removing a small piece of nerve to be examined in a lab (nerve biopsy). This is rare.  Removing and examining a small amount of the fluid that surrounds the brain and spinal cord (lumbar puncture). This is rare. How is this treated? Treatment for this condition may involve:  Treating the underlying cause of the neuropathy, such as diabetes, kidney disease, or vitamin deficiencies.  Stopping medicines that can cause neuropathy, such as chemotherapy.  Medicine to relieve pain. Medicines may include: ? Prescription or over-the-counter pain medicine. ? Antiseizure medicine. ? Antidepressants. ? Pain-relieving patches that are applied to painful areas of skin.  Surgery to relieve pressure on a nerve or to destroy a nerve that is causing pain.  Physical therapy to help improve movement and  balance.  Devices to help you move around (assistive devices). Follow these instructions at home: Medicines  Take over-the-counter and prescription medicines only as told by your health care provider. Do not take any other medicines without first asking your health care provider.  Do not drive or use heavy machinery while taking prescription pain medicine. Lifestyle   Do not use any products that contain nicotine or tobacco, such as cigarettes and e-cigarettes. Smoking keeps blood from reaching damaged nerves. If you need help quitting, ask your health care provider.  Avoid or limit alcohol. Too much alcohol can cause a vitamin B deficiency, and vitamin B is needed for healthy nerves.  Eat a healthy diet. This includes: ? Eating foods that are high in fiber, such as fresh fruits and vegetables, whole grains, and beans. ? Limiting foods that are high in fat and processed sugars, such as fried or sweet foods. General instructions   If you have diabetes, work closely with your health care provider to keep your blood sugar under control.  If you have numbness in your feet: ? Check every day for signs of injury or infection. Watch for redness, warmth, and swelling. ? Wear padded socks and comfortable shoes. These help protect your feet.  Develop a good support system. Living with peripheral neuropathy can be stressful. Consider talking with a mental health specialist or joining a support group.  Use assistive devices and attend physical therapy as told by your health care provider. This may include using a walker or a cane.  Keep all follow-up visits as told by your health care provider. This is important. Contact a health care provider if:  You have new signs or symptoms of peripheral neuropathy.  You are struggling emotionally from dealing with peripheral neuropathy.  Your pain is not well-controlled. Get help right away if:  You have an injury or infection that is not healing  normally.  You develop new weakness in an arm or leg.  You fall frequently. Summary  Peripheral neuropathy is when the nerves in the arms, or legs are damaged, resulting in numbness, weakness, or pain.  There are many causes of peripheral neuropathy, including diabetes, pinched nerves, vitamin deficiencies, autoimmune disease, and hereditary conditions.  Diagnosing and finding the cause of peripheral neuropathy can be difficult. Your health care provider will take your medical history, do a physical exam, and do tests, including blood tests and nerve function tests.    Treatment involves treating the underlying cause of the neuropathy and taking medicines to help control pain. Physical therapy and assistive devices may also help. This information is not intended to replace advice given to you by your health care provider. Make sure you discuss any questions you have with your health care provider. Document Revised: 12/10/2016 Document Reviewed: 03/08/2016 Elsevier Patient Education  2020 [employer]  

## 2019-05-18 NOTE — Therapy (Signed)
[place], [place], [address] Phone: [phone]   Fax:  [phone]  Physical Therapy Treatment  Patient Details  Name: Thomas Joseph MRN: [id number] Date of Birth: 10-Mar-1933 Referring Provider (PT): Lazaro Arms, NP   Encounter Date: 05/18/2019  PT End of Session - 05/18/19 1056    Visit Number  3    Number of Visits  12    Date for PT Re-Evaluation  06/14/19    Authorization Type  [employer] Medicare, progress note by visit 10    PT Start Time  1051    PT Stop Time  1135    PT Time Calculation (min)  44 min    Activity Tolerance  Patient tolerated treatment well    Behavior During Therapy  [facility] for tasks assessed/performed       Past Medical History:  Diagnosis Date  . Anemia   . BPH (benign prostatic hyperplasia)   . Diabetes mellitus without complication ([place])   . ED (erectile dysfunction)   . Family history of adverse reaction to anesthesia    son had some problem years ago, pt unsure of what exact problem was  . Hypertension   . Obesity   . Pneumonia     Past Surgical History:  Procedure Laterality Date  .  LEFT KNEE ARTHROCOPY    . HERNIA REPAIR    . TRANSURETHRAL RESECTION OF PROSTATE N/A 12/26/2014   Procedure: TRANSURETHRAL RESECTION OF THE PROSTATE;  Surgeon: Cleon Gustin, MD;  Location: [facility];  Service: Urology;  Laterality: N/A;    Vitals:   05/18/19 1056  Pulse: 75  SpO2: 97%    Subjective Assessment - 05/18/19 1055    Subjective  No major soreness after last therapy session. Had toenails trimmed by podiatry earlier this AM. No new complaints/concerns otherwise    Pertinent History  Diabetic, Covid hospitalization 02/02/19-02/08/19    Limitations  Lifting;House hold activities;Standing;Walking    Patient Stated Goals  Improve activity tolerance    Currently in Pain?  No/denies         [facility] PT Assessment - 05/18/19 0001      Strength   Right Knee Extension   4+/5    Left Knee Extension  4+/5                   [facility] Adult PT Treatment/Exercise - 05/18/19 0001      Knee/Hip Exercises: Aerobic   Recumbent Bike  L1 x 5 min   at end of tx., monitoring/assist to keep feet on pedals   Nustep  L3 x 6 min UE/LE      Knee/Hip Exercises: Standing   Heel Raises  Both;2 sets;10 reps    Heel Raises Limitations  on Airex    Hip Flexion Limitations  Airex marches x 20 reps    Forward Step Up  Right;Left;15 reps;Hand Hold: 2;Step Height: 6"    Functional Squat Limitations  partial squat at counter x 15 reps    Rocker Board  2 minutes    Rocker Board Limitations  1 min ea. dynamic balance fw/rev and lateral shifts    Other Standing Knee Exercises  Romberg eyes closed at counter on Airex 20 sec x 3 feet together    Other Standing Knee Exercises  floor ladder stepping foreward only x 4 with CGA cues for increased steplnegth, stepping over cones x 4 for 2 reps with CGA      Shoulder Exercises: ROM/Strengthening  Cybex Press Limitations  20 lbs. 2x10 vertical grips    Cybex Row Limitations  30 lbs. 2x10 vertical grips               PT Short Term Goals - 05/03/19 1130      PT SHORT TERM GOAL #1   Title  Improve 5xsit<>stand time to 20 sec or less as demonstration of improved leg strength to assist transfers and stair navigation    Baseline  28 sec    Time  3    Period  Weeks    Status  New    Target Date  05/24/19      PT SHORT TERM GOAL #2   Title  Independent with initial HEP    Time  3    Period  Weeks    Status  New    Target Date  05/24/19        PT Long Term Goals - 05/03/19 1129      PT LONG TERM GOAL #1   Title  Independent with advanced HEP for independent exercise progression after therapy d/c    Baseline  basic HEP at eval, will continue to update    Time  6    Period  Weeks    Status  New    Target Date  06/14/19      PT LONG TERM GOAL #2   Title  Improve 6 min walk test distance by 100 feet or greater  to improve tolerance for community mobility for activities such as grocery shopping and yardwork    Baseline  595 feet    Time  6    Period  Weeks    Status  New    Target Date  06/14/19      PT LONG TERM GOAL #3   Title  Increase LE strength at leats grossly 1/2 MMT grade to improve ability for transfers from low seats    Baseline  see objective    Time  6    Period  Weeks    Status  New    Target Date  06/14/19      PT LONG TERM GOAL #4   Title  Improve Berg Balance Test score at least 5 points to work towards decreased fall risk    Baseline  44/56    Time  6    Period  Weeks    Status  New    Target Date  06/14/19            Plan - 05/18/19 1131    Clinical Impression Statement  Continued previous exercise and balance progression work-pt. still with tendency for decreased bilat. steplength so added floor ladder walk and stepping over cones for work on increasing step height and length. Session well-tolerated-for progress expect gradual improvement given s/p Covid with age and medical comorbidities but good potential for further improvement with continued tx.    Personal Factors and Comorbidities  Age;Comorbidity 2    Comorbidities  Diabetic, recent Covid infection    Examination-Activity Limitations  Lift;Squat;Locomotion Level;Stairs;Stand    Examination-Participation Restrictions  Community Activity;Shop;Yard Work;Cleaning    Stability/Clinical Decision Making  Stable/Uncomplicated    Clinical Decision Making  Low    Rehab Potential  Good    PT Frequency  2x / week    PT Duration  6 weeks    PT Next Visit Plan  NUSTEP warm up, partial squats at counter, step ups, Airex heel raises and marches, general LE/UE strengthening  and balance work, Investment banker, operational for increased steplength and arm swing    PT Home Exercise Plan  [id number]: brief HEP at eval for sit<>stands, heel raises and hip abd SLR in standing-will add/update as appropriate at future sessions    Consulted and  Agree with Plan of Care  Patient       Patient will benefit from skilled therapeutic intervention in order to improve the following deficits and impairments:  Abnormal gait, Decreased balance, Decreased endurance, Difficulty walking, Decreased activity tolerance, Decreased strength, Postural dysfunction  Visit Diagnosis: History of 2019 novel coronavirus disease (COVID-19)  Muscle weakness (generalized)  Difficulty in walking, not elsewhere classified     Problem List Patient Active Problem List   Diagnosis Date Noted  . History of COVID-19 04/11/2019  . Shortness of breath 04/11/2019  . Irregular heart rhythm 04/11/2019  . Fatigue 04/11/2019  . Generalized weakness 04/11/2019  . Acute respiratory failure with hypoxia (HCC) 02/02/2019  . Essential hypertension 02/02/2019  . Type 2 diabetes mellitus with complication, without long-term current use of insulin (HCC) 02/02/2019  . Hypokalemia 02/02/2019  . BPH (benign prostatic hyperplasia) 12/26/2014    Lazarus Gowda, PT, DPT 05/18/19 11:35 AM  [facility] [address] [place], [place], [address] Phone: [phone]   Fax:  [phone]  Name: MARKY BURESH MRN: [id number] Date of Birth: 02/16/1933

## 2019-05-18 NOTE — Progress Notes (Signed)
Subjective: Thomas Joseph is a 86 y.o. male patient seen today at [facility]. Pt has h/o NIDDM with PAD and painful mycotic nails b/l that are difficult to trim. Pain interferes with ambulation. Aggravating factors include wearing enclosed shoe gear. Pain is relieved with periodic professional debridement  Patient Active Problem List   Diagnosis Date Noted  . History of COVID-19 04/11/2019  . Shortness of breath 04/11/2019  . Irregular heart rhythm 04/11/2019  . Fatigue 04/11/2019  . Generalized weakness 04/11/2019  . Acute respiratory failure with hypoxia (HCC) 02/02/2019  . Essential hypertension 02/02/2019  . Type 2 diabetes mellitus with complication, without long-term current use of insulin (HCC) 02/02/2019  . Hypokalemia 02/02/2019  . BPH (benign prostatic hyperplasia) 12/26/2014    Current Outpatient Medications on File Prior to Visit  Medication Sig Dispense Refill  . acetaminophen (TYLENOL) 325 MG tablet Take 325-650 mg by mouth every 12 (twelve) hours as needed for mild pain or headache.    Marland Kitchen amLODipine (NORVASC) 10 MG tablet Take 10 mg by mouth daily.    Marland Kitchen aspirin 81 MG chewable tablet Chew 81 mg by mouth every other day.     . cetirizine (ZYRTEC) 10 MG tablet Take 10 mg by mouth daily as needed for allergies.    . hydrocortisone (ANUSOL-HC) 25 MG suppository Place 1 suppository (25 mg total) rectally 2 (two) times daily. 12 suppository 0  . latanoprost (XALATAN) 0.005 % ophthalmic solution Place 1 drop into both eyes at bedtime.    Marland Kitchen lisinopril-hydrochlorothiazide (ZESTORETIC) 20-12.5 MG tablet Take 1 tablet by mouth daily.    . meloxicam (MOBIC) 15 MG tablet Take 15 mg by mouth daily as needed.    . metFORMIN (GLUCOPHAGE-XR) 500 MG 24 hr tablet Take 1,000 mg by mouth daily.     . [DISCONTINUED] benazepril (LOTENSIN) 40 MG tablet Take 40 mg by mouth daily.    . [DISCONTINUED] zolpidem (AMBIEN) 10 MG tablet Take 10 mg by mouth at bedtime as needed for sleep.      No  current facility-administered medications on file prior to visit.    Allergies  Allergen Reactions  . Hydrochlorothiazide Other (See Comments)    Reaction not recalled, believes it just made him urinate very frequently  . Lipitor [Atorvastatin] Other (See Comments)    Muscle pain  . Viagra [Sildenafil Citrate] Palpitations    Objective: Physical Exam  General: Well developed, nourished, no acute distress, awake, alert and oriented x 3  Vascular:  Capillary refill time to digits immediate b/l. Faintly palpable DP pulses b/l. Nonpalpable PT pulses b/l. Pedal hair absent b/l Skin temperature gradient warm to cool b/l. No edema noted b/l. No pain with calf compression b/l.  Dermatological:  Pedal skin with normal turgor, texture and tone bilaterally. No open wounds bilaterally. No interdigital macerations bilaterally. Toenails L hallux, L 3rd toe, L 4th toe, L 5th toe, R hallux, R 3rd toe, R 4th toe and R 5th toe elongated, dystrophic, thickened, and crumbly with subungual debris and tenderness to dorsal palpation. Anonychia noted L 2nd toe and R 2nd toe. Nailbed(s) epithelialized.   Musculoskeletal:  Normal muscle strength 5/5 to all lower extremity muscle groups bilaterally. No pain crepitus or joint limitation noted with ROM b/l. Pes planus deformity noted b/l.  Patient ambulates independent of any assistive aids.  Neurological:  Protective sensation intact 5/5 intact bilaterally with 10g monofilament b/l. Vibratory sensation diminished b/l.  Assessment and Plan:  Problem List Items Addressed This Visit  None    Visit Diagnoses    Pain due to onychomycosis of toenails of both feet    -  Primary   Type II diabetes mellitus with peripheral circulatory disorder (HCC)       Relevant Medications   lisinopril-hydrochlorothiazide (ZESTORETIC) 20-12.5 MG tablet     -Examined patient.  -Continue diabetic foot care principles. Literature dispensed on today.  -Toenails L hallux, L 3rd  toe, L 4th toe, L 5th toe, R hallux, R 3rd toe, R 4th toe and R 5th toe debrided in length and girth without iatrogenic bleeding with sterile nail nipper and dremel.  -Patient to continue soft, supportive shoe gear daily. -Patient to report any pedal injuries to medical professional immediately. -Patient/POA to call should there be question/concern in the interim.  Return in about 3 months (around 08/18/2019) for diabetic nail trim.  Marzetta Board, DPM

## 2019-05-21 ENCOUNTER — Ambulatory Visit: Payer: Medicare Other | Admitting: Physical Therapy

## 2019-05-21 ENCOUNTER — Other Ambulatory Visit: Payer: Self-pay

## 2019-05-21 ENCOUNTER — Encounter: Payer: Self-pay | Admitting: Physical Therapy

## 2019-05-21 VITALS — HR 63

## 2019-05-21 DIAGNOSIS — M6281 Muscle weakness (generalized): Secondary | ICD-10-CM

## 2019-05-21 DIAGNOSIS — Z8616 Personal history of COVID-19: Secondary | ICD-10-CM

## 2019-05-21 DIAGNOSIS — R262 Difficulty in walking, not elsewhere classified: Secondary | ICD-10-CM

## 2019-05-21 NOTE — Therapy (Signed)
[place], [place], [address] Phone: [phone]   Fax:  [phone]  Physical Therapy Treatment  Patient Details  Name: Thomas Joseph MRN: [id number] Date of Birth: [birth year] Referring Provider (PT): Lazaro Arms, NP   Encounter Date: 05/21/2019  PT End of Session - 05/21/19 1117    Visit Number  4    Number of Visits  12    Date for PT Re-Evaluation  06/14/19    Authorization Type  [employer] Medicare, progress note by visit 10    PT Start Time  1113    PT Stop Time  1157    PT Time Calculation (min)  44 min    Activity Tolerance  Patient tolerated treatment well    Behavior During Therapy  [facility] for tasks assessed/performed       Past Medical History:  Diagnosis Date  . Anemia   . BPH (benign prostatic hyperplasia)   . Diabetes mellitus without complication ([place])   . ED (erectile dysfunction)   . Family history of adverse reaction to anesthesia    son had some problem years ago, pt unsure of what exact problem was  . Hypertension   . Obesity   . Pneumonia     Past Surgical History:  Procedure Laterality Date  .  LEFT KNEE ARTHROCOPY    . HERNIA REPAIR    . TRANSURETHRAL RESECTION OF PROSTATE N/A 12/26/2014   Procedure: TRANSURETHRAL RESECTION OF THE PROSTATE;  Surgeon: Cleon Gustin, MD;  Location: [facility];  Service: Urology;  Laterality: N/A;    Vitals:   05/21/19 1117  Pulse: 63  SpO2: 99%    Subjective Assessment - 05/21/19 1116    Subjective  "Getting there." Pt. reports feels like endurance and walking tolerance are improving.    Currently in Pain?  No/denies                       [facility] Adult PT Treatment/Exercise - 05/21/19 0001      Knee/Hip Exercises: Aerobic   Recumbent Bike  L2 x 5 min   cues/assist for foot position on pedal   Nustep  L4 x 6 min UE/LE      Knee/Hip Exercises: Standing   Heel Raises  Both;2 sets;10 reps    Heel Raises Limitations   on Airex    Forward Step Up  Right;Left;1 set;15 reps;Hand Hold: 2;Step Height: 6"    Functional Squat Limitations  TRX partial squat 2x10   visual cues/demo for form   Rocker Board  2 minutes    Rocker Board Limitations  1 min ea. dynamic balance fw/rev and lateral shifts    SLS  SLS 5-10 sec holds ea. bilat. x 3 reps on Airex with intermittent UE support on counter    Other Standing Knee Exercises  toe taps x 6 in step x 20 reps    Other Standing Knee Exercises  stepping over cones x 4 back and forth with CGA      Shoulder Exercises: ROM/Strengthening   Cybex Press Limitations  20 lbs. 2x10 vertical grips    Cybex Row Limitations  35 lbs. 2x10 vertical grips             PT Education - 05/21/19 1152    Education Details  exercises, POC    Person(s) Educated  Patient    Methods  Explanation;Demonstration;Verbal cues    Comprehension  Verbalized understanding;Returned demonstration  PT Short Term Goals - 05/21/19 1154      PT SHORT TERM GOAL #1   Title  Improve 5xsit<>stand time to 20 sec or less as demonstration of improved leg strength to assist transfers and stair navigation    Baseline  28 sec    Time  3    Period  Weeks    Status  On-going    Target Date  05/24/19      PT SHORT TERM GOAL #2   Title  Independent with initial HEP    Baseline  met    Time  3    Period  Weeks    Status  Achieved    Target Date  05/24/19        PT Long Term Goals - 05/21/19 1153      PT LONG TERM GOAL #1   Title  Independent with advanced HEP for independent exercise progression after therapy d/c    Baseline  basic HEP at eval, will continue to update    Time  6    Period  Weeks    Status  On-going      PT LONG TERM GOAL #2   Title  Improve 6 min walk test distance by 100 feet or greater to improve tolerance for community mobility for activities such as grocery shopping and yardwork    Baseline  595 feet    Time  6    Period  Weeks    Status  On-going      PT  LONG TERM GOAL #3   Title  Increase LE strength at leats grossly 1/2 MMT grade to improve ability for transfers from low seats    Baseline  ongoing    Time  6    Period  Weeks    Status  On-going      PT LONG TERM GOAL #4   Title  Improve Berg Balance Test score at least 5 points to work towards decreased fall risk    Baseline  44/56    Time  6    Period  Weeks    Status  On-going            Plan - 05/21/19 1157    Clinical Impression Statement  Pt. able to demo improvement with gait with increased bilat. steplength and improving foot clearance though still some tendency decreased steplength with more shuffling gait when fatigued. Progress ongoing for LTGs and expect progress will be gradual to improve strength, activity tolerance and balance for decreased fall risk.    Personal Factors and Comorbidities  Age;Comorbidity 2    Comorbidities  Diabetic, recent Covid infection    Examination-Activity Limitations  Lift;Squat;Locomotion Level;Stairs;Stand    Examination-Participation Restrictions  Community Activity;Shop;Yard Work;Cleaning    Stability/Clinical Decision Making  Stable/Uncomplicated    Clinical Decision Making  Low    Rehab Potential  Good    PT Frequency  2x / week    PT Duration  6 weeks    PT Treatment/Interventions  ADLs/Self Care Home Management;Cryotherapy;Moist Heat;Manual techniques;Patient/family education;Therapeutic exercise;Balance training;Gait training;Therapeutic activities;Functional mobility training;Neuromuscular re-education;Energy conservation;Stair training    PT Next Visit Plan  NUSTEP warm up, partial squats at counter, step ups, Airex heel raises and marches, general LE/UE strengthening and balance work, gait training for increased steplength and arm swing    PT [place]: brief HEP at eval for sit<>stands, heel raises and hip abd SLR in standing-will add/update as appropriate at future sessions  Consulted and Agree with Plan  of Care  Patient       Patient will benefit from skilled therapeutic intervention in order to improve the following deficits and impairments:  Abnormal gait, Decreased balance, Decreased endurance, Difficulty walking, Decreased activity tolerance, Decreased strength, Postural dysfunction  Visit Diagnosis: History of 2019 novel coronavirus disease (COVID-19)  Muscle weakness (generalized)  Difficulty in walking, not elsewhere classified     Problem List Patient Active Problem List   Diagnosis Date Noted  . History of COVID-19 04/11/2019  . Shortness of breath 04/11/2019  . Irregular heart rhythm 04/11/2019  . Fatigue 04/11/2019  . Generalized weakness 04/11/2019  . Acute respiratory failure with hypoxia ([place]) 02/02/2019  . Essential hypertension 02/02/2019  . Type 2 diabetes mellitus with complication, without long-term current use of insulin ([place]) 02/02/2019  . Hypokalemia 02/02/2019  . BPH (benign prostatic hyperplasia) 12/26/2014    Beaulah Dinning, PT, DPT 05/21/19 12:00 PM  [facility] [address] [place], [place], [address] Phone: [phone]   Fax:  [phone]  Name: Thomas Joseph MRN: [id number] Date of Birth: February 28, 1933

## 2019-05-23 ENCOUNTER — Other Ambulatory Visit: Payer: Self-pay

## 2019-05-23 ENCOUNTER — Encounter: Payer: Self-pay | Admitting: Physical Therapy

## 2019-05-23 ENCOUNTER — Ambulatory Visit: Payer: Medicare Other | Admitting: Physical Therapy

## 2019-05-23 VITALS — HR 84

## 2019-05-23 DIAGNOSIS — M6281 Muscle weakness (generalized): Secondary | ICD-10-CM | POA: Diagnosis not present

## 2019-05-23 DIAGNOSIS — R262 Difficulty in walking, not elsewhere classified: Secondary | ICD-10-CM

## 2019-05-23 DIAGNOSIS — Z8616 Personal history of COVID-19: Secondary | ICD-10-CM

## 2019-05-23 NOTE — Therapy (Signed)
[place], [place], [address] Phone: [phone]   Fax:  [phone]  Physical Therapy Treatment  Patient Details  Name: Thomas Joseph MRN: [id number] Date of Birth: 1933/08/22 Referring Provider (PT): Lazaro Arms, NP   Encounter Date: 05/23/2019  PT End of Session - 05/23/19 1223    Visit Number  5    Number of Visits  12    Date for PT Re-Evaluation  06/14/19    Authorization Type  [employer] Medicare, progress note by visit 10    PT Start Time  1139    PT Stop Time  1222    PT Time Calculation (min)  43 min    Activity Tolerance  Patient tolerated treatment well    Behavior During Therapy  [facility] for tasks assessed/performed       Past Medical History:  Diagnosis Date  . Anemia   . BPH (benign prostatic hyperplasia)   . Diabetes mellitus without complication ([place])   . ED (erectile dysfunction)   . Family history of adverse reaction to anesthesia    son had some problem years ago, pt unsure of what exact problem was  . Hypertension   . Obesity   . Pneumonia     Past Surgical History:  Procedure Laterality Date  .  LEFT KNEE ARTHROCOPY    . HERNIA REPAIR    . TRANSURETHRAL RESECTION OF PROSTATE N/A 12/26/2014   Procedure: TRANSURETHRAL RESECTION OF THE PROSTATE;  Surgeon: Cleon Gustin, MD;  Location: [facility];  Service: Urology;  Laterality: N/A;    Vitals:   05/23/19 1145  Pulse: 84  SpO2: 98%    Subjective Assessment - 05/23/19 1145    Subjective  Pt. reports that therapy seems to be helping his legs-was able to have better tolerance with recent yardwork. No significant soreness noted after last session.    Pertinent History  Diabetic, Covid hospitalization 02/02/19-02/08/19    Currently in Pain?  No/denies         [facility] PT Assessment - 05/23/19 0001      Transfers   Five time sit to stand comments   25 seconds                    [facility] Adult PT Treatment/Exercise  - 05/23/19 0001      Knee/Hip Exercises: Aerobic   Recumbent Bike  L2 x 5 min   cues/assist for foot position on pedal   Nustep  L4 x 6 min UE/LE      Knee/Hip Exercises: Standing   Heel Raises  Both;2 sets;10 reps    Heel Raises Limitations  on Airex    Forward Step Up  Right;Left;15 reps;Hand Hold: 2;Step Height: 8"    Functional Squat  2 sets;10 reps    Functional Squat Limitations  squat at counter with bilat. UE support    Rocker Board  2 minutes    Rocker Board Limitations  1 min ea. dynamic balance fw/rev and lateral shifts    SLS  SLS 5-10 sec holds ea. bilat. x 3 reps on Airex with intermittent UE support on counter    Other Standing Knee Exercises  toe taps x 8 in step x 20 reps    Other Standing Knee Exercises  stepping over cones x 4 back and forth with CGA   cues for reciprocal gait/stepping     Shoulder Exercises: ROM/Strengthening   Cybex Press Limitations  25 lbs. 2x10 vertical grips  Cybex Row Limitations  35 lbs. 2x10 vertical grips      Ankle Exercises: Stretches   Slant Board Stretch  3 reps;20 seconds             PT Education - 05/23/19 1223    Education Details  Progress, POC    Person(s) Educated  Patient    Methods  Explanation    Comprehension  Verbalized understanding       PT Short Term Goals - 05/21/19 1154      PT SHORT TERM GOAL #1   Title  Improve 5xsit<>stand time to 20 sec or less as demonstration of improved leg strength to assist transfers and stair navigation    Baseline  28 sec    Time  3    Period  Weeks    Status  On-going    Target Date  05/24/19      PT SHORT TERM GOAL #2   Title  Independent with initial HEP    Baseline  met    Time  3    Period  Weeks    Status  Achieved    Target Date  05/24/19        PT Long Term Goals - 05/21/19 1153      PT LONG TERM GOAL #1   Title  Independent with advanced HEP for independent exercise progression after therapy d/c    Baseline  basic HEP at eval, will continue to  update    Time  6    Period  Weeks    Status  On-going      PT LONG TERM GOAL #2   Title  Improve 6 min walk test distance by 100 feet or greater to improve tolerance for community mobility for activities such as grocery shopping and yardwork    Baseline  595 feet    Time  6    Period  Weeks    Status  On-going      PT LONG TERM GOAL #3   Title  Increase LE strength at leats grossly 1/2 MMT grade to improve ability for transfers from low seats    Baseline  ongoing    Time  6    Period  Weeks    Status  On-going      PT LONG TERM GOAL #4   Title  Improve Berg Balance Test score at least 5 points to work towards decreased fall risk    Baseline  44/56    Time  6    Period  Weeks    Status  On-going            Plan - 05/23/19 1223    Clinical Impression Statement  Mild improvement in 5 times sit<>stand test from baseline status. Still with difficulty for transfers from lower seats due to leg weakness but strength and functional activity tolerance gradually improving from baseline status.    Personal Factors and Comorbidities  Age;Comorbidity 2    Comorbidities  Diabetic, recent Covid infection    Examination-Activity Limitations  Lift;Squat;Locomotion Level;Stairs;Stand    Examination-Participation Restrictions  Community Activity;Shop;Yard Work;Cleaning    Stability/Clinical Decision Making  Stable/Uncomplicated    Clinical Decision Making  Low    Rehab Potential  Good    PT Frequency  2x / week    PT Duration  6 weeks    PT Treatment/Interventions  ADLs/Self Care Home Management;Cryotherapy;Moist Heat;Manual techniques;Patient/family education;Therapeutic exercise;Balance training;Gait training;Therapeutic activities;Functional mobility training;Neuromuscular re-education;Energy conservation;Stair training    PT Next  Visit Plan  update HEP next session: NUSTEP warm up, partial squats at counter, step ups, Airex heel raises and marches, general LE/UE strengthening and  balance work, gait training for increased steplength and arm swing    PT Home Exercise Plan  [id number]: brief HEP at eval for sit<>stands, heel raises and hip abd SLR in standing-will add/update as appropriate at future sessions    Consulted and Agree with Plan of Care  Patient       Patient will benefit from skilled therapeutic intervention in order to improve the following deficits and impairments:  Abnormal gait, Decreased balance, Decreased endurance, Difficulty walking, Decreased activity tolerance, Decreased strength, Postural dysfunction  Visit Diagnosis: History of 2019 novel coronavirus disease (COVID-19)  Muscle weakness (generalized)  Difficulty in walking, not elsewhere classified     Problem List Patient Active Problem List   Diagnosis Date Noted  . History of COVID-19 04/11/2019  . Shortness of breath 04/11/2019  . Irregular heart rhythm 04/11/2019  . Fatigue 04/11/2019  . Generalized weakness 04/11/2019  . Acute respiratory failure with hypoxia ([place]) 02/02/2019  . Essential hypertension 02/02/2019  . Type 2 diabetes mellitus with complication, without long-term current use of insulin ([place]) 02/02/2019  . Hypokalemia 02/02/2019  . BPH (benign prostatic hyperplasia) 12/26/2014    Beaulah Dinning, PT, DPT 05/23/19 12:26 PM  [facility] [address] [place], [place], [address] Phone: [phone]   Fax:  [phone]  Name: Thomas Joseph MRN: [id number] Date of Birth: 07-18-1933

## 2019-05-28 ENCOUNTER — Encounter: Payer: Self-pay | Admitting: Physical Therapy

## 2019-05-28 ENCOUNTER — Other Ambulatory Visit: Payer: Self-pay

## 2019-05-28 ENCOUNTER — Ambulatory Visit: Payer: Medicare Other | Admitting: Physical Therapy

## 2019-05-28 VITALS — HR 86

## 2019-05-28 DIAGNOSIS — M6281 Muscle weakness (generalized): Secondary | ICD-10-CM

## 2019-05-28 DIAGNOSIS — Z8616 Personal history of COVID-19: Secondary | ICD-10-CM

## 2019-05-28 DIAGNOSIS — R262 Difficulty in walking, not elsewhere classified: Secondary | ICD-10-CM

## 2019-05-28 NOTE — Therapy (Signed)
[place], [place], [address] Phone: [phone]   Fax:  [phone]  Physical Therapy Treatment  Patient Details  Name: Thomas Joseph MRN: [id number] Date of Birth: 21-Jan-1933 Referring Provider (PT): Lazaro Arms, NP   Encounter Date: 05/28/2019  PT End of Session - 05/28/19 1223    Visit Number  6    Number of Visits  12    Date for PT Re-Evaluation  06/14/19    Authorization Type  [employer] Medicare, progress note by visit 10    PT Start Time  1100    PT Stop Time  1143    PT Time Calculation (min)  43 min    Activity Tolerance  Patient tolerated treatment well    Behavior During Therapy  [facility] for tasks assessed/performed       Past Medical History:  Diagnosis Date  . Anemia   . BPH (benign prostatic hyperplasia)   . Diabetes mellitus without complication ([place])   . ED (erectile dysfunction)   . Family history of adverse reaction to anesthesia    son had some problem years ago, pt unsure of what exact problem was  . Hypertension   . Obesity   . Pneumonia     Past Surgical History:  Procedure Laterality Date  .  LEFT KNEE ARTHROCOPY    . HERNIA REPAIR    . TRANSURETHRAL RESECTION OF PROSTATE N/A 12/26/2014   Procedure: TRANSURETHRAL RESECTION OF THE PROSTATE;  Surgeon: Cleon Gustin, MD;  Location: [facility];  Service: Urology;  Laterality: N/A;    Vitals:   05/28/19 1223  Pulse: 86  SpO2: 99%    Subjective Assessment - 05/28/19 1221    Subjective  No new complaints or concerns this AM. No pain pre-tx.    Pertinent History  Diabetic, Covid hospitalization 02/02/19-02/08/19    Currently in Pain?  No/denies                        [facility] Adult PT Treatment/Exercise - 05/28/19 0001      Knee/Hip Exercises: Aerobic   Recumbent Bike  L2 x 5 min   cues/assist for foot position on pedal   Nustep  L5 x 6 min UE/LE      Knee/Hip Exercises: Machines for Strengthening   Cybex Leg Press  45 lbs. 2x10      Knee/Hip Exercises: Standing   Heel Raises  Both;2 sets;15 reps    Heel Raises Limitations  on Airex    Forward Step Up  Right;Left;2 sets;10 reps;Hand Hold: 2;Step Height: 8"    Rocker Board  2 minutes    Rocker Board Limitations  1 min ea. dynamic balance fw/rev and lateral shifts    SLS  SLS 5-10 sec holds ea. bilat. x 3 reps on Airex with intermittent UE support on counter    Other Standing Knee Exercises  toe taps x 8 in step x 20 reps    Other Standing Knee Exercises  stepping over cones x 4 back and forth with CGA   cues for reciprocal gait/stepping     Shoulder Exercises: ROM/Strengthening   Cybex Press Limitations  25 lbs. 3x10 vertical grips    Cybex Row Limitations  35 lbs. 3x10 vertical grips             PT Education - 05/28/19 1223    Education Details  POC    Person(s) Educated  Patient    Methods  Explanation    Comprehension  Verbalized understanding       PT Short Term Goals - 05/21/19 1154      PT SHORT TERM GOAL #1   Title  Improve 5xsit<>stand time to 20 sec or less as demonstration of improved leg strength to assist transfers and stair navigation    Baseline  28 sec    Time  3    Period  Weeks    Status  On-going    Target Date  05/24/19      PT SHORT TERM GOAL #2   Title  Independent with initial HEP    Baseline  met    Time  3    Period  Weeks    Status  Achieved    Target Date  05/24/19        PT Long Term Goals - 05/21/19 1153      PT LONG TERM GOAL #1   Title  Independent with advanced HEP for independent exercise progression after therapy d/c    Baseline  basic HEP at eval, will continue to update    Time  6    Period  Weeks    Status  On-going      PT LONG TERM GOAL #2   Title  Improve 6 min walk test distance by 100 feet or greater to improve tolerance for community mobility for activities such as grocery shopping and yardwork    Baseline  595 feet    Time  6    Period  Weeks    Status   On-going      PT LONG TERM GOAL #3   Title  Increase LE strength at leats grossly 1/2 MMT grade to improve ability for transfers from low seats    Baseline  ongoing    Time  6    Period  Weeks    Status  On-going      PT LONG TERM GOAL #4   Title  Improve Berg Balance Test score at least 5 points to work towards decreased fall risk    Baseline  44/56    Time  6    Period  Weeks    Status  On-going            Plan - 05/28/19 1224    Clinical Impression Statement  Still with some tendency decreased bilat. step height and length but progressing from baseline with strength gains and improving functional activity tolerance for activities such as yardwork.    Personal Factors and Comorbidities  Age;Comorbidity 2    Comorbidities  Diabetic, recent Covid infection    Examination-Activity Limitations  Lift;Squat;Locomotion Level;Stairs;Stand    Examination-Participation Restrictions  Community Activity;Shop;Yard Work;Cleaning    Stability/Clinical Decision Making  Stable/Uncomplicated    Clinical Decision Making  Low    Rehab Potential  Good    PT Frequency  2x / week    PT Duration  6 weeks    PT Treatment/Interventions  ADLs/Self Care Home Management;Cryotherapy;Moist Heat;Manual techniques;Patient/family education;Therapeutic exercise;Balance training;Gait training;Therapeutic activities;Functional mobility training;Neuromuscular re-education;Energy conservation;Stair training    PT Next Visit Plan  update HEP as appropriate, NUSTEP warm up, partial squats at counter, step ups, Airex heel raises and marches, general LE/UE strengthening and balance work, gait training for increased steplength and arm swing    PT Home Exercise Plan  [id number]: brief HEP at eval for sit<>stands, heel raises and hip abd SLR in standing-will add/update as appropriate at future sessions    Consulted and  Agree with Plan of Care  Patient       Patient will benefit from skilled therapeutic intervention in  order to improve the following deficits and impairments:  Abnormal gait, Decreased balance, Decreased endurance, Difficulty walking, Decreased activity tolerance, Decreased strength, Postural dysfunction  Visit Diagnosis: History of 2019 novel coronavirus disease (COVID-19)  Muscle weakness (generalized)  Difficulty in walking, not elsewhere classified     Problem List Patient Active Problem List   Diagnosis Date Noted  . History of COVID-19 04/11/2019  . Shortness of breath 04/11/2019  . Irregular heart rhythm 04/11/2019  . Fatigue 04/11/2019  . Generalized weakness 04/11/2019  . Acute respiratory failure with hypoxia ([place]) 02/02/2019  . Essential hypertension 02/02/2019  . Type 2 diabetes mellitus with complication, without long-term current use of insulin ([place]) 02/02/2019  . Hypokalemia 02/02/2019  . BPH (benign prostatic hyperplasia) 12/26/2014    Beaulah Dinning, PT, DPT 05/28/19 12:26 PM  [facility] [address] [place], [place], [address] Phone: [phone]   Fax:  [phone]  Name: VERLON PISCHKE MRN: [id number] Date of Birth: 05/10/1933

## 2019-05-30 ENCOUNTER — Other Ambulatory Visit: Payer: Self-pay

## 2019-05-30 ENCOUNTER — Encounter: Payer: Self-pay | Admitting: Physical Therapy

## 2019-05-30 ENCOUNTER — Ambulatory Visit: Payer: Medicare Other | Admitting: Physical Therapy

## 2019-05-30 DIAGNOSIS — R262 Difficulty in walking, not elsewhere classified: Secondary | ICD-10-CM

## 2019-05-30 DIAGNOSIS — Z8616 Personal history of COVID-19: Secondary | ICD-10-CM

## 2019-05-30 DIAGNOSIS — M6281 Muscle weakness (generalized): Secondary | ICD-10-CM

## 2019-05-30 NOTE — Therapy (Signed)
[place], [place], [address] Phone: [phone]   Fax:  [phone]  Physical Therapy Treatment  Patient Details  Name: Thomas Joseph MRN: [id number] Date of Birth: 1933-12-04 Referring Provider (PT): Lazaro Arms, NP   Encounter Date: 05/30/2019  PT End of Session - 05/30/19 1141    Visit Number  7    Number of Visits  12    Date for PT Re-Evaluation  06/14/19    Authorization Type  [employer] Medicare, progress note by visit 10    PT Start Time  1100    PT Stop Time  1144    PT Time Calculation (min)  44 min    Activity Tolerance  Patient tolerated treatment well    Behavior During Therapy  [facility] for tasks assessed/performed       Past Medical History:  Diagnosis Date  . Anemia   . BPH (benign prostatic hyperplasia)   . Diabetes mellitus without complication ([place])   . ED (erectile dysfunction)   . Family history of adverse reaction to anesthesia    son had some problem years ago, pt unsure of what exact problem was  . Hypertension   . Obesity   . Pneumonia     Past Surgical History:  Procedure Laterality Date  .  LEFT KNEE ARTHROCOPY    . HERNIA REPAIR    . TRANSURETHRAL RESECTION OF PROSTATE N/A 12/26/2014   Procedure: TRANSURETHRAL RESECTION OF THE PROSTATE;  Surgeon: Cleon Gustin, MD;  Location: [facility];  Service: Urology;  Laterality: N/A;    There were no vitals filed for this visit.  Subjective Assessment - 05/30/19 1139    Subjective  Doing OK this AM-he reports feels like balance is improving.    Pertinent History  Diabetic, Covid hospitalization 02/02/19-02/08/19    Limitations  Lifting;House hold activities;Standing;Walking    Patient Stated Goals  Improve activity tolerance    Currently in Pain?  No/denies                        [facility] Adult PT Treatment/Exercise - 05/30/19 0001      Knee/Hip Exercises: Aerobic   Recumbent Bike  L3 x 5 min    Nustep  L5  x 6 min UE/LE      Knee/Hip Exercises: Machines for Strengthening   Cybex Leg Press  45 lbs. 2x10      Knee/Hip Exercises: Standing   Heel Raises  Both;2 sets;15 reps    Heel Raises Limitations  on Airex    Forward Step Up  Right;Left;2 sets;10 reps;Hand Hold: 2;Step Height: 8"    Rocker Board  2 minutes   blue board with "circle" on floor side   Rocker Board Limitations  1 min ea. dynamic balance fw/rev and lateral shifts    SLS  SLS 5-10 sec holds ea. bilat. x 3 reps on Airex with intermittent UE support on counter    Other Standing Knee Exercises  toe taps x 8 in step x 20 reps    Other Standing Knee Exercises  step forward/reverse with both legs over cone x 5 reps, Romberg foam eyes closed 20 sec x 3      Shoulder Exercises: ROM/Strengthening   Cybex Press Limitations  25 lbs. 3x10 vertical grips    Cybex Row Limitations  35 lbs. 3x10 vertical grips             PT Education - 05/30/19 1141  Education Details  exercises    Person(s) Educated  Patient    Methods  Explanation;Demonstration;Verbal cues    Comprehension  Verbalized understanding;Returned demonstration       PT Short Term Goals - 05/21/19 1154      PT SHORT TERM GOAL #1   Title  Improve 5xsit<>stand time to 20 sec or less as demonstration of improved leg strength to assist transfers and stair navigation    Baseline  28 sec    Time  3    Period  Weeks    Status  On-going    Target Date  05/24/19      PT SHORT TERM GOAL #2   Title  Independent with initial HEP    Baseline  met    Time  3    Period  Weeks    Status  Achieved    Target Date  05/24/19        PT Long Term Goals - 05/21/19 1153      PT LONG TERM GOAL #1   Title  Independent with advanced HEP for independent exercise progression after therapy d/c    Baseline  basic HEP at eval, will continue to update    Time  6    Period  Weeks    Status  On-going      PT LONG TERM GOAL #2   Title  Improve 6 min walk test distance by 100  feet or greater to improve tolerance for community mobility for activities such as grocery shopping and yardwork    Baseline  595 feet    Time  6    Period  Weeks    Status  On-going      PT LONG TERM GOAL #3   Title  Increase LE strength at leats grossly 1/2 MMT grade to improve ability for transfers from low seats    Baseline  ongoing    Time  6    Period  Weeks    Status  On-going      PT LONG TERM GOAL #4   Title  Improve Berg Balance Test score at least 5 points to work towards decreased fall risk    Baseline  44/56    Time  6    Period  Weeks    Status  On-going            Plan - 05/30/19 1142    Clinical Impression Statement  Continued progression of previous balance and general strength/conditioning activities all with good tolerance. Cues needed for increased steplength with balance/cone stepping activity but overall continues to progress to toward therapy goals.    Personal Factors and Comorbidities  Age;Comorbidity 2    Comorbidities  Diabetic, recent Covid infection    Examination-Activity Limitations  Lift;Squat;Locomotion Level;Stairs;Stand    Examination-Participation Restrictions  Community Activity;Shop;Yard Work;Cleaning    Stability/Clinical Decision Making  Stable/Uncomplicated    Clinical Decision Making  Low    Rehab Potential  Good    PT Frequency  2x / week    PT Duration  6 weeks    PT Treatment/Interventions  ADLs/Self Care Home Management;Cryotherapy;Moist Heat;Manual techniques;Patient/family education;Therapeutic exercise;Balance training;Gait training;Therapeutic activities;Functional mobility training;Neuromuscular re-education;Energy conservation;Stair training    PT Next Visit Plan  update HEP as appropriate, NUSTEP warm up, partial squats at counter, step ups, Airex heel raises and marches, general LE/UE strengthening and balance work, gait training for increased steplength and arm swing    PT Home Exercise Plan  [id number]: brief HEP at  eval  for sit<>stands, heel raises and hip abd SLR in standing-will add/update as appropriate at future sessions    Consulted and Agree with Plan of Care  Patient       Patient will benefit from skilled therapeutic intervention in order to improve the following deficits and impairments:     Visit Diagnosis: History of 2019 novel coronavirus disease (COVID-19)  Muscle weakness (generalized)  Difficulty in walking, not elsewhere classified     Problem List Patient Active Problem List   Diagnosis Date Noted  . History of COVID-19 04/11/2019  . Shortness of breath 04/11/2019  . Irregular heart rhythm 04/11/2019  . Fatigue 04/11/2019  . Generalized weakness 04/11/2019  . Acute respiratory failure with hypoxia ([place]) 02/02/2019  . Essential hypertension 02/02/2019  . Type 2 diabetes mellitus with complication, without long-term current use of insulin ([place]) 02/02/2019  . Hypokalemia 02/02/2019  . BPH (benign prostatic hyperplasia) 12/26/2014    Beaulah Dinning, PT, DPT 05/30/19 11:50 AM  [facility] [address] [place], [place], [address] Phone: [phone]   Fax:  [phone]  Name: Thomas Joseph MRN: [id number] Date of Birth: 1933-10-19

## 2019-06-04 ENCOUNTER — Other Ambulatory Visit: Payer: Self-pay

## 2019-06-04 ENCOUNTER — Encounter: Payer: Self-pay | Admitting: Physical Therapy

## 2019-06-04 ENCOUNTER — Ambulatory Visit: Payer: Medicare Other | Admitting: Physical Therapy

## 2019-06-04 DIAGNOSIS — Z8616 Personal history of COVID-19: Secondary | ICD-10-CM

## 2019-06-04 DIAGNOSIS — M6281 Muscle weakness (generalized): Secondary | ICD-10-CM | POA: Diagnosis not present

## 2019-06-04 DIAGNOSIS — R262 Difficulty in walking, not elsewhere classified: Secondary | ICD-10-CM

## 2019-06-04 NOTE — Therapy (Signed)
[place], [place], [address] Phone: [phone]   Fax:  [phone]  Physical Therapy Treatment  Patient Details  Name: MARCANTHONY SLEIGHT MRN: [id number] Date of Birth: [birth year] Referring Provider (PT): Lazaro Arms, NP   Encounter Date: 06/04/2019  PT End of Session - 06/04/19 1455    Visit Number  8    Number of Visits  12    Date for PT Re-Evaluation  06/14/19    Authorization Type  [employer] Medicare, progress note by visit 10    PT Start Time  1452    PT Stop Time  1537    PT Time Calculation (min)  45 min    Activity Tolerance  Patient tolerated treatment well    Behavior During Therapy  [facility] for tasks assessed/performed       Past Medical History:  Diagnosis Date  . Anemia   . BPH (benign prostatic hyperplasia)   . Diabetes mellitus without complication ([place])   . ED (erectile dysfunction)   . Family history of adverse reaction to anesthesia    son had some problem years ago, pt unsure of what exact problem was  . Hypertension   . Obesity   . Pneumonia     Past Surgical History:  Procedure Laterality Date  .  LEFT KNEE ARTHROCOPY    . HERNIA REPAIR    . TRANSURETHRAL RESECTION OF PROSTATE N/A 12/26/2014   Procedure: TRANSURETHRAL RESECTION OF THE PROSTATE;  Surgeon: Cleon Gustin, MD;  Location: [facility];  Service: Urology;  Laterality: N/A;    There were no vitals filed for this visit.  Subjective Assessment - 06/04/19 1455    Subjective  No pain this PM. No new complaints/concerns otherwise today.    Pertinent History  Diabetic, Covid hospitalization 02/02/19-02/08/19    Patient Stated Goals  Improve activity tolerance    Currently in Pain?  No/denies                        [facility] Adult PT Treatment/Exercise - 06/04/19 0001      Knee/Hip Exercises: Aerobic   Recumbent Bike  L2 x 5 min   cues/assist for foot position in strap   Nustep  L6 x 6 min UE/LE      Knee/Hip Exercises: Machines for Strengthening   Cybex Leg Press  55 lbs. 2x10      Knee/Hip Exercises: Standing   Heel Raises  Both;2 sets;15 reps    Heel Raises Limitations  on Airex    Lateral Step Up Limitations  side step ups alternating sides to Blue side BOSU 2x5 ea. bilat.    Forward Step Up  Right;Left;2 sets;10 reps;Hand Hold: 2;Step Height: 8"    Functional Squat Limitations  TRX squat 2x10    Rocker Board  2 minutes   blue board with "circle" on floor side   Other Standing Knee Exercises  Airex "slow march"/alternating SLS x 20, Romberg foam eyes closed feet together 20 sec x 3      Shoulder Exercises: ROM/Strengthening   Cybex Press Limitations  25 lbs. 3x10 vertical grips    Cybex Row Limitations  35 lbs. 3x10 vertical grips             PT Education - 06/04/19 1540    Education Details  POC    Person(s) Educated  Patient    Methods  Explanation    Comprehension  Verbalized understanding  PT Short Term Goals - 05/21/19 1154      PT SHORT TERM GOAL #1   Title  Improve 5xsit<>stand time to 20 sec or less as demonstration of improved leg strength to assist transfers and stair navigation    Baseline  28 sec    Time  3    Period  Weeks    Status  On-going    Target Date  05/24/19      PT SHORT TERM GOAL #2   Title  Independent with initial HEP    Baseline  met    Time  3    Period  Weeks    Status  Achieved    Target Date  05/24/19        PT Long Term Goals - 05/21/19 1153      PT LONG TERM GOAL #1   Title  Independent with advanced HEP for independent exercise progression after therapy d/c    Baseline  basic HEP at eval, will continue to update    Time  6    Period  Weeks    Status  On-going      PT LONG TERM GOAL #2   Title  Improve 6 min walk test distance by 100 feet or greater to improve tolerance for community mobility for activities such as grocery shopping and yardwork    Baseline  595 feet    Time  6    Period  Weeks    Status   On-going      PT LONG TERM GOAL #3   Title  Increase LE strength at leats grossly 1/2 MMT grade to improve ability for transfers from low seats    Baseline  ongoing    Time  6    Period  Weeks    Status  On-going      PT LONG TERM GOAL #4   Title  Improve Berg Balance Test score at least 5 points to work towards decreased fall risk    Baseline  44/56    Time  6    Period  Weeks    Status  On-going            Plan - 06/04/19 1541    Clinical Impression Statement  Updated HEP and continued work on general strengthening and balance/proprioceptive challenges all with good tolerance. Pt. progressing well with therapy goals and tentatively plan d/c after next session pending status.    Personal Factors and Comorbidities  Age;Comorbidity 2    Comorbidities  Diabetic, recent Covid infection    Examination-Activity Limitations  Lift;Squat;Locomotion Level;Stairs;Stand    Examination-Participation Restrictions  Community Activity;Shop;Yard Work;Cleaning    Stability/Clinical Decision Making  Stable/Uncomplicated    Clinical Decision Making  Low    Rehab Potential  Good    PT Frequency  2x / week    PT Duration  6 weeks    PT Treatment/Interventions  ADLs/Self Care Home Management;Cryotherapy;Moist Heat;Manual techniques;Patient/family education;Therapeutic exercise;Balance training;Gait training;Therapeutic activities;Functional mobility training;Neuromuscular re-education;Energy conservation;Stair training    PT Next Visit Plan  tentative d/c next visit, NUSTEP warm up, partial squats at counter, step ups, Airex heel raises and marches, general LE/UE strengthening and balance work, Personnel officer for increased steplength and arm swing    PT Home Exercise Plan  [id number]    Consulted and Agree with Plan of Care  Patient       Patient will benefit from skilled therapeutic intervention in order to improve the following deficits and impairments:  Abnormal  gait, Decreased balance, Decreased  endurance, Difficulty walking, Decreased activity tolerance, Decreased strength, Postural dysfunction  Visit Diagnosis: History of 2019 novel coronavirus disease (COVID-19)  Muscle weakness (generalized)  Difficulty in walking, not elsewhere classified     Problem List Patient Active Problem List   Diagnosis Date Noted  . History of COVID-19 04/11/2019  . Shortness of breath 04/11/2019  . Irregular heart rhythm 04/11/2019  . Fatigue 04/11/2019  . Generalized weakness 04/11/2019  . Acute respiratory failure with hypoxia ([place]) 02/02/2019  . Essential hypertension 02/02/2019  . Type 2 diabetes mellitus with complication, without long-term current use of insulin ([place]) 02/02/2019  . Hypokalemia 02/02/2019  . BPH (benign prostatic hyperplasia) 12/26/2014    Beaulah Dinning, PT, DPT 06/04/19 3:44 PM  [place] [facility] [address] [place], [place], [address] Phone: [phone]   Fax:  [phone]  Name: CHARLIS HARNER MRN: [id number] Date of Birth: Jun 11, 1933

## 2019-06-06 ENCOUNTER — Other Ambulatory Visit: Payer: Self-pay

## 2019-06-06 ENCOUNTER — Ambulatory Visit: Payer: Medicare Other | Admitting: Physical Therapy

## 2019-06-06 ENCOUNTER — Encounter: Payer: Self-pay | Admitting: Physical Therapy

## 2019-06-06 DIAGNOSIS — M6281 Muscle weakness (generalized): Secondary | ICD-10-CM | POA: Diagnosis not present

## 2019-06-06 DIAGNOSIS — R262 Difficulty in walking, not elsewhere classified: Secondary | ICD-10-CM

## 2019-06-06 DIAGNOSIS — Z8616 Personal history of COVID-19: Secondary | ICD-10-CM

## 2019-06-06 NOTE — Therapy (Signed)
[place], [place], [address] Phone: [phone]   Fax:  [phone]  Physical Therapy Treatment/Discharge  Patient Details  Name: Thomas Joseph MRN: [id number] Date of Birth: 1933/03/11 Referring Provider (PT): Lazaro Arms, NP   Encounter Date: 06/06/2019  PT End of Session - 06/06/19 1145    Visit Number  9    Number of Visits  12    Date for PT Re-Evaluation  06/14/19    Authorization Type  [employer] Medicare, progress note by visit 10    PT Start Time  1102    PT Stop Time  1144    PT Time Calculation (min)  42 min    Activity Tolerance  Patient tolerated treatment well    Behavior During Therapy  [facility] for tasks assessed/performed       Past Medical History:  Diagnosis Date  . Anemia   . BPH (benign prostatic hyperplasia)   . Diabetes mellitus without complication ([place])   . ED (erectile dysfunction)   . Family history of adverse reaction to anesthesia    son had some problem years ago, pt unsure of what exact problem was  . Hypertension   . Obesity   . Pneumonia     Past Surgical History:  Procedure Laterality Date  .  LEFT KNEE ARTHROCOPY    . HERNIA REPAIR    . TRANSURETHRAL RESECTION OF PROSTATE N/A 12/26/2014   Procedure: TRANSURETHRAL RESECTION OF THE PROSTATE;  Surgeon: Cleon Gustin, MD;  Location: [facility];  Service: Urology;  Laterality: N/A;    There were no vitals filed for this visit.  Subjective Assessment - 06/06/19 1153    Subjective  Pt. presents for 9th therapy session today-he is doing well now with no complaints and is in agreement with plans to d/c today. He plans on rejoining the [employer] and will continue exercises independently.    Pertinent History  Diabetic, Covid hospitalization 02/02/19-02/08/19    Patient Stated Goals  Improve activity tolerance    Currently in Pain?  No/denies         [facility] PT Assessment - 06/06/19 0001      Strength   Right Shoulder  Flexion  5/5    Right Shoulder ABduction  5/5    Right Shoulder Internal Rotation  5/5    Right Shoulder External Rotation  5/5    Left Shoulder Flexion  5/5    Left Shoulder ABduction  5/5    Left Shoulder Internal Rotation  5/5    Left Shoulder External Rotation  5/5    Right Elbow Flexion  5/5    Right Elbow Extension  5/5    Left Elbow Flexion  5/5    Left Elbow Extension  5/5    Right Hip Flexion  4+/5    Right Hip External Rotation   5/5    Right Hip Internal Rotation  5/5    Left Hip Flexion  5/5    Left Hip External Rotation  4+/5    Left Hip Internal Rotation  5/5    Right Knee Flexion  5/5    Right Knee Extension  5/5    Left Knee Flexion  5/5    Left Knee Extension  5/5    Right Ankle Dorsiflexion  4+/5    Right Ankle Inversion  4+/5    Right Ankle Eversion  5/5    Left Ankle Dorsiflexion  5/5    Left Ankle Inversion  4+/5  Left Ankle Eversion  5/5      Transfers   Five time sit to stand comments   22 seconds      6 minute walk test results    Aerobic Endurance Distance Walked  815      Berg Balance Test   Sit to Stand  Able to stand  independently using hands    Standing Unsupported  Able to stand safely 2 minutes    Sitting with Back Unsupported but Feet Supported on Floor or Stool  Able to sit safely and securely 2 minutes    Stand to Sit  Sits safely with minimal use of hands    Transfers  Able to transfer safely, minor use of hands    Standing Unsupported with Eyes Closed  Able to stand 10 seconds safely    Standing Unsupported with Feet Together  Able to place feet together independently and stand 1 minute safely    From Standing, Reach Forward with Outstretched Arm  Can reach confidently >25 cm (10")    From Standing Position, Pick up Object from Floor  Able to pick up shoe safely and easily    From Standing Position, Turn to Look Behind Over each Shoulder  Looks behind from both sides and weight shifts well    Turn 360 Degrees  Able to turn 360  degrees safely but slowly    Standing Unsupported, Alternately Place Feet on Step/Stool  Able to stand independently and safely and complete 8 steps in 20 seconds    Standing Unsupported, One Foot in Front  Able to plae foot ahead of the other independently and hold 30 seconds    Standing on One Leg  Able to lift leg independently and hold 5-10 seconds    Total Score  51                    [facility] Adult PT Treatment/Exercise - 06/06/19 0001      Neuro Re-ed    Neuro Re-ed Details   --   Merrilee Jansky balance components     Knee/Hip Exercises: Machines for Strengthening   Cybex Leg Press  55 lbs. 2x10      Knee/Hip Exercises: Standing   Other Standing Knee Exercises  Berg balance components with SLS, toe taps, tandem stance      Shoulder Exercises: ROM/Strengthening   Cybex Press Limitations  25 lbs. 3x10 vertical grips    Cybex Row Limitations  35 lbs. 3x10 vertical grips             PT Education - 06/06/19 1155    Education Details  POC, gym exercise recommendations/parameters for strength training and cardio    Person(s) Educated  Patient    Methods  Explanation;Verbal cues    Comprehension  Verbalized understanding       PT Short Term Goals - 06/06/19 1157      PT SHORT TERM GOAL #1   Title  Improve 5xsit<>stand time to 20 sec or less as demonstration of improved leg strength to assist transfers and stair navigation    Baseline  22 sec    Time  3    Period  Weeks    Status  Not Met      PT SHORT TERM GOAL #2   Title  Independent with initial HEP    Baseline  met    Time  3    Period  Weeks    Status  Achieved  PT Long Term Goals - 06/06/19 1125      PT LONG TERM GOAL #1   Title  Independent with advanced HEP for independent exercise progression after therapy d/c    Baseline  met    Time  6    Period  Weeks    Status  Achieved      PT LONG TERM GOAL #2   Title  Improve 6 min walk test distance by 100 feet or greater to improve tolerance  for community mobility for activities such as grocery shopping and yardwork    Baseline  815 feet (improved from 595 feet at eval)    Time  6    Period  Weeks    Status  Achieved      PT LONG TERM GOAL #3   Title  Increase LE strength at least grossly 1/2 MMT grade to improve ability for transfers from low seats    Baseline  partially met-see objective    Time  6    Period  Weeks    Status  Partially Met      PT LONG TERM GOAL #4   Title  Improve Berg Balance Test score at least 5 points to work towards decreased fall risk    Baseline  51/56 (improved from 44/56)    Time  6    Period  Weeks    Status  Achieved            Plan - 06/06/19 1158    Clinical Impression Statement  Pt. has progressed well with therapy with improved cardiovascular endurance as evidenced by  gains for 6 min walk test distance and improved balance with 7 point gain from baseline status in Berg score from 44/56 at eval to current 51/56. At this point minimal functional limitations so plan d/c to HEP and expect pt. can continue his progress independently via HEP/exercise at [facility] and recommend MD follow up if having any future changes in status.    Personal Factors and Comorbidities  Age;Comorbidity 2    Comorbidities  Diabetic, recent Covid infection    Examination-Activity Limitations  Lift;Squat;Locomotion Level;Stairs;Stand    Examination-Participation Restrictions  Community Activity;Shop;Yard Work;Cleaning    Stability/Clinical Decision Making  Stable/Uncomplicated    Clinical Decision Making  Low    Rehab Potential  Good    PT Frequency  2x / week    PT Duration  6 weeks    PT Treatment/Interventions  ADLs/Self Care Home Management;Cryotherapy;Moist Heat;Manual techniques;Patient/family education;Therapeutic exercise;Balance training;Gait training;Therapeutic activities;Functional mobility training;Neuromuscular re-education;Energy conservation;Stair training    PT Next Visit Plan  NA    PT Home  Exercise Plan  [id number]    Consulted and Agree with Plan of Care  Patient       Patient will benefit from skilled therapeutic intervention in order to improve the following deficits and impairments:  Abnormal gait, Decreased balance, Decreased endurance, Difficulty walking, Decreased activity tolerance, Decreased strength, Postural dysfunction  Visit Diagnosis: History of 2019 novel coronavirus disease (COVID-19)  Muscle weakness (generalized)  Difficulty in walking, not elsewhere classified     Problem List Patient Active Problem List   Diagnosis Date Noted  . History of COVID-19 04/11/2019  . Shortness of breath 04/11/2019  . Irregular heart rhythm 04/11/2019  . Fatigue 04/11/2019  . Generalized weakness 04/11/2019  . Acute respiratory failure with hypoxia ([place]) 02/02/2019  . Essential hypertension 02/02/2019  . Type 2 diabetes mellitus with complication, without long-term current use of insulin ([place]) 02/02/2019  .  Hypokalemia 02/02/2019  . BPH (benign prostatic hyperplasia) 12/26/2014        PHYSICAL THERAPY DISCHARGE SUMMARY  Visits from Start of Care: 9  Current functional level related to goals / functional outcomes: See above   Remaining deficits: Still with mild LE weakness/increased time required for 5 times sit<>stand but expect can continue progress independently with HEP   Education / Equipment: HEP, POC Plan: Patient agrees to discharge.  Patient goals were partially met. Patient is being discharged due to meeting the stated rehab goals.  ?????            Beaulah Dinning, PT, DPT 06/06/19 12:03 PM  [place] [facility] [address] [place], [place], [address] Phone: [phone]   Fax:  [phone]  Name: Thomas Joseph MRN: [id number] Date of Birth: 10-17-1933

## 2019-08-13 ENCOUNTER — Ambulatory Visit: Payer: Medicare Other | Admitting: Podiatry

## 2019-08-13 ENCOUNTER — Encounter: Payer: Self-pay | Admitting: Podiatry

## 2019-08-13 ENCOUNTER — Other Ambulatory Visit: Payer: Self-pay

## 2019-08-13 DIAGNOSIS — B351 Tinea unguium: Secondary | ICD-10-CM | POA: Diagnosis not present

## 2019-08-13 DIAGNOSIS — M2011 Hallux valgus (acquired), right foot: Secondary | ICD-10-CM | POA: Diagnosis not present

## 2019-08-13 DIAGNOSIS — E119 Type 2 diabetes mellitus without complications: Secondary | ICD-10-CM

## 2019-08-13 DIAGNOSIS — M79675 Pain in left toe(s): Secondary | ICD-10-CM

## 2019-08-13 DIAGNOSIS — E1151 Type 2 diabetes mellitus with diabetic peripheral angiopathy without gangrene: Secondary | ICD-10-CM | POA: Diagnosis not present

## 2019-08-13 DIAGNOSIS — M2012 Hallux valgus (acquired), left foot: Secondary | ICD-10-CM

## 2019-08-13 DIAGNOSIS — M79674 Pain in right toe(s): Secondary | ICD-10-CM

## 2019-08-13 DIAGNOSIS — M19072 Primary osteoarthritis, left ankle and foot: Secondary | ICD-10-CM | POA: Diagnosis not present

## 2019-08-13 NOTE — Patient Instructions (Signed)
Diabetes Mellitus and Foot Care Foot care is an important part of your health, especially when you have diabetes. Diabetes may cause you to have problems because of poor blood flow (circulation) to your feet and legs, which can cause your skin to:  Become thinner and drier.  Break more easily.  Heal more slowly.  Peel and crack. You may also have nerve damage (neuropathy) in your legs and feet, causing decreased feeling in them. This means that you may not notice minor injuries to your feet that could lead to more serious problems. Noticing and addressing any potential problems early is the best way to prevent future foot problems. How to care for your feet Foot hygiene  Wash your feet daily with warm water and mild soap. Do not use hot water. Then, pat your feet and the areas between your toes until they are completely dry. Do not soak your feet as this can dry your skin.  Trim your toenails straight across. Do not dig under them or around the cuticle. File the edges of your nails with an emery board or nail file.  Apply a moisturizing lotion or petroleum jelly to the skin on your feet and to dry, brittle toenails. Use lotion that does not contain alcohol and is unscented. Do not apply lotion between your toes. Shoes and socks  Wear clean socks or stockings every day. Make sure they are not too tight. Do not wear knee-high stockings since they may decrease blood flow to your legs.  Wear shoes that fit properly and have enough cushioning. Always look in your shoes before you put them on to be sure there are no objects inside.  To break in new shoes, wear them for just a few hours a day. This prevents injuries on your feet. Wounds, scrapes, corns, and calluses  Check your feet daily for blisters, cuts, bruises, sores, and redness. If you cannot see the bottom of your feet, use a mirror or ask someone for help.  Do not cut corns or calluses or try to remove them with medicine.  If you  find a minor scrape, cut, or break in the skin on your feet, keep it and the skin around it clean and dry. You may clean these areas with mild soap and water. Do not clean the area with peroxide, alcohol, or iodine.  If you have a wound, scrape, corn, or callus on your foot, look at it several times a day to make sure it is healing and not infected. Check for: ? Redness, swelling, or pain. ? Fluid or blood. ? Warmth. ? Pus or a bad smell. General instructions  Do not cross your legs. This may decrease blood flow to your feet.  Do not use heating pads or hot water bottles on your feet. They may burn your skin. If you have lost feeling in your feet or legs, you may not know this is happening until it is too late.  Protect your feet from hot and cold by wearing shoes, such as at the beach or on hot pavement.  Schedule a complete foot exam at least once a year (annually) or more often if you have foot problems. If you have foot problems, report any cuts, sores, or bruises to your health care provider immediately. Contact a health care provider if:  You have a medical condition that increases your risk of infection and you have any cuts, sores, or bruises on your feet.  You have an injury that is not   healing.  You have redness on your legs or feet.  You feel burning or tingling in your legs or feet.  You have pain or cramps in your legs and feet.  Your legs or feet are numb.  Your feet always feel cold.  You have pain around a toenail. Get help right away if:  You have a wound, scrape, corn, or callus on your foot and: ? You have pain, swelling, or redness that gets worse. ? You have fluid or blood coming from the wound, scrape, corn, or callus. ? Your wound, scrape, corn, or callus feels warm to the touch. ? You have pus or a bad smell coming from the wound, scrape, corn, or callus. ? You have a fever. ? You have a red line going up your leg. Summary  Check your feet every day  for cuts, sores, red spots, swelling, and blisters.  Moisturize feet and legs daily.  Wear shoes that fit properly and have enough cushioning.  If you have foot problems, report any cuts, sores, or bruises to your health care provider immediately.  Schedule a complete foot exam at least once a year (annually) or more often if you have foot problems. This information is not intended to replace advice given to you by your health care provider. Make sure you discuss any questions you have with your health care provider. Document Revised: 09/20/2018 Document Reviewed: 01/30/2016 Elsevier Patient Education  [place]  A bunion is a bump on the base of the big toe that forms when the bones of the big toe joint move out of position. Bunions may be small at first, but they often get larger over time. They can make walking painful. What are the causes? A bunion may be caused by:  Wearing narrow or pointed shoes that force the big toe to press against the other toes.  Abnormal foot development that causes the foot to roll inward (pronate).  Changes in the foot that are caused by certain diseases, such as rheumatoid arthritis or polio.  A foot injury. What increases the risk? The following factors may make you more likely to develop this condition:  Wearing shoes that squeeze the toes together.  Having certain diseases, such as: ? Rheumatoid arthritis. ? Polio. ? Cerebral palsy.  Having family members who have bunions.  Being born with a foot deformity, such as flat feet or low arches.  Doing activities that put a lot of pressure on the feet, such as ballet dancing. What are the signs or symptoms? The main symptom of a bunion is a noticeable bump on the big toe. Other symptoms may include:  Pain.  Swelling around the big toe.  Redness and inflammation.  Thick or hardened skin on the big toe or between the toes.  Stiffness or loss of motion in the big  toe.  Trouble with walking. How is this diagnosed? A bunion may be diagnosed based on your symptoms, medical history, and activities. You may have tests, such as:  X-rays. These allow your health care provider to check the position of the bones in your foot and look for damage to your joint. They also help your health care provider determine the severity of your bunion and the best way to treat it.  Joint aspiration. In this test, a sample of fluid is removed from the toe joint. This test may be done if you are in a lot of pain. It helps rule out diseases that cause  painful swelling of the joints, such as arthritis. How is this treated? Treatment depends on the severity of your symptoms. The goal of treatment is to relieve symptoms and prevent the bunion from getting worse. Your health care provider may recommend:  Wearing shoes that have a wide toe box.  Using bunion pads to cushion the affected area.  Taping your toes together to keep them in a normal position.  Placing a device inside your shoe (orthotics) to help reduce pressure on your toe joint.  Taking medicine to ease pain, inflammation, and swelling.  Applying heat or ice to the affected area.  Doing stretching exercises.  Surgery to remove scar tissue and move the toes back into their normal position. This treatment is rare. Follow these instructions at home: Managing pain, stiffness, and swelling   If directed, put ice on the painful area: ? Put ice in a plastic bag. ? Place a towel between your skin and the bag. ? Leave the ice on for 20 minutes, 2-3 times a day. Activity   If directed, apply heat to the affected area before you exercise. Use the heat source that your health care provider recommends, such as a moist heat pack or a heating pad. ? Place a towel between your skin and the heat source. ? Leave the heat on for 20-30 minutes. ? Remove the heat if your skin turns bright red. This is especially important  if you are unable to feel pain, heat, or cold. You may have a greater risk of getting burned.  Do exercises as told by your health care provider. General instructions  Support your toe joint with proper footwear, shoe padding, or taping as told by your health care provider.  Take over-the-counter and prescription medicines only as told by your health care provider.  Keep all follow-up visits as told by your health care provider. This is important. Contact a health care provider if your symptoms:  Get worse.  Do not improve in 2 weeks. Get help right away if you have:  Severe pain and trouble with walking. Summary  A bunion is a bump on the base of the big toe that forms when the bones of the big toe joint move out of position.  Bunions can make walking painful.  Treatment depends on the severity of your symptoms.  Support your toe joint with proper footwear, shoe padding, or taping as told by your health care provider. This information is not intended to replace advice given to you by your health care provider. Make sure you discuss any questions you have with your health care provider. Document Revised: 07/04/2017 Document Reviewed: 05/10/2017 Elsevier Patient Education  2020 [employer]   Diabetic Neuropathy Diabetic neuropathy refers to nerve damage that is caused by diabetes (diabetes mellitus). Over time, people with diabetes can develop nerve damage throughout the body. There are several types of diabetic neuropathy:  Peripheral neuropathy. This is the most common type of diabetic neuropathy. It causes damage to nerves that carry signals between the spinal cord and other parts of the body (peripheral nerves). This usually affects nerves in the feet and legs first, and may eventually affect the hands and arms. The damage affects the ability to sense touch or temperature.  Autonomic neuropathy. This type causes damage to nerves that control involuntary functions (autonomic  nerves). These nerves carry signals that control: ? Heartbeat. ? Body temperature. ? Blood pressure. ? Urination. ? Digestion. ? Sweating. ? Sexual function. ? Response to changing blood  sugar (glucose) levels.  Focal neuropathy. This type of nerve damage affects one area of the body, such as an arm, a leg, or the face. The injury may involve one nerve or a small group of nerves. Focal neuropathy can be painful and unpredictable, and occurs most often in older adults with diabetes. This often develops suddenly, but usually improves over time and does not cause long-term problems.  Proximal neuropathy. This type of nerve damage affects the nerves of the thighs, hips, buttocks, or legs. It causes severe pain, weakness, and muscle death (atrophy), usually in the thigh muscles. It is more common among older men and people who have type 2 diabetes. The length of recovery time may vary. What are the causes? Peripheral, autonomic, and focal neuropathies are caused by diabetes that is not well controlled with treatment. The cause of proximal neuropathy is not known, but it may be caused by inflammation related to uncontrolled blood glucose levels. What are the signs or symptoms? Peripheral neuropathy Peripheral neuropathy develops slowly over time. When the nerves of the feet and legs no longer work, you may experience:  Burning, stabbing, or aching pain in the legs or feet.  Pain or cramping in the legs or feet.  Loss of feeling (numbness) and inability to feel pressure or pain in the feet. This can lead to: ? Thick calluses or sores on areas of constant pressure. ? Ulcers. ? Reduced ability to feel temperature changes.  Foot deformities.  Muscle weakness.  Loss of balance or coordination. Autonomic neuropathy The symptoms of autonomic neuropathy vary depending on which nerves are affected. Symptoms may include:  Problems with digestion, such as: ? Nausea or vomiting. ? Poor  appetite. ? Bloating. ? Diarrhea or constipation. ? Trouble swallowing. ? Losing weight without trying to.  Problems with the heart, blood and lungs, such as: ? Dizziness, especially when standing up. ? Fainting. ? Shortness of breath. ? Irregular heartbeat.  Bladder problems, such as: ? Trouble starting or stopping urination. ? Leaking urine. ? Trouble emptying the bladder. ? Urinary tract infections (UTIs).  Problems with other body functions, such as: ? Sweat. You may sweat too much or too little. ? Temperature. You might get hot easily. Or, you might feel cold more than usual. ? Sexual function. Men may not be able to get or maintain an erection. Women may have vaginal dryness and difficulty with arousal. Focal neuropathy Symptoms affect only one area of the body. Common symptoms include:  Numbness.  Tingling.  Burning pain.  Prickling feeling.  Very sensitive skin.  Weakness.  Inability to move (paralysis).  Muscle twitching.  Muscles getting smaller (wasting).  Poor coordination.  Double or blurred vision. Proximal neuropathy  Sudden, severe pain in the hip, thigh, or buttocks. Pain may spread from the back into the legs (sciatica).  Pain and numbness in the arms and legs.  Tingling.  Loss of bladder control or bowel control.  Weakness and wasting of thigh muscles.  Difficulty getting up from a seated position.  Abdominal swelling.  Unexplained weight loss. How is this diagnosed? Diagnosis usually involves reviewing your medical history and any symptoms you have. Diagnosis varies depending on the type of neuropathy your health care provider suspects. Peripheral neuropathy Your health care provider will check areas that are affected by your nervous system (neurologic exam), such as your reflexes, how you move, and what you can feel. You may have other tests, such as:  Blood tests.  Removal and examination of fluid  that surrounds the spinal  cord (lumbar puncture).  CT scan.  MRI.  A test to check the nerves that control muscles (electromyogram, EMG).  Tests of how quickly messages pass through your nerves (nerve conduction velocity tests).  Removal of a small piece of nerve to be examined under a microscope (biopsy). Autonomic neuropathy You may have tests, such as:  Tests to measure your blood pressure and heart rate. This may include monitoring you while you are safely secured to an exam table that moves you from a lying position to an upright position (table tilt test).  Breathing tests to check your lungs.  Tests to check how food moves through the digestive system (gastric emptying tests).  Blood, sweat, or urine tests.  Ultrasound of your bladder.  Spinal fluid tests. Focal neuropathy This condition may be diagnosed with:  A neurologic exam.  CT scan.  MRI.  EMG.  Nerve conduction velocity tests. Proximal neuropathy There is no test to diagnose this type of neuropathy. You may have tests to rule out other possible causes of this type of neuropathy. Tests may include:  X-rays of your spine and lumbar region.  Lumbar puncture.  MRI. How is this treated? The goal of treatment is to keep nerve damage from getting worse. The most important part of treatment is keeping your blood glucose level and your A1C level within your target range by following your diabetes management plan. Over time, maintaining lower blood glucose levels helps lessen symptoms. In some cases, you may need prescription pain medicine. Follow these instructions at home:  Lifestyle   Do not use any products that contain nicotine or tobacco, such as cigarettes and e-cigarettes. If you need help quitting, ask your health care provider.  Be physically active every day. Include strength training and balance exercises.  Follow a healthy meal plan.  Work with your health care provider to manage your blood pressure. General  instructions  Follow your diabetes management plan as directed. ? Check your blood glucose levels as directed by your health care provider. ? Keep your blood glucose in your target range as directed by your health care provider. ? Have your A1C level checked at least two times a year, or as often as told by your health care provider.  Take over the counter and prescription medicines only as told by your health care provider. This includes insulin and diabetes medicine.  Do not drive or use heavy machinery while taking prescription pain medicines.  Check your skin and feet every day for cuts, bruises, redness, blisters, or sores.  Keep all follow up visits as told by your health care provider. This is important. Contact a health care provider if:  You have burning, stabbing, or aching pain in your legs or feet.  You are unable to feel pressure or pain in your feet.  You develop problems with digestion, such as: ? Nausea. ? Vomiting. ? Bloating. ? Constipation. ? Diarrhea. ? Abdominal pain.  You have difficulty with urination, such as inability: ? To control when you urinate (incontinence). ? To completely empty the bladder (retention).  You have palpitations.  You feel dizzy, weak, or faint when you stand up. Get help right away if:  You cannot urinate.  You have sudden weakness or loss of coordination.  You have trouble speaking.  You have pain or pressure in your chest.  You have an irregular heart beat.  You have sudden inability to move a part of your body. Summary  Diabetic  neuropathy refers to nerve damage that is caused by diabetes. It can affect nerves throughout the entire body, causing numbness and pain in the arms, legs, digestive tract, heart, and other body systems.  Keep your blood glucose level and your blood pressure in your target range, as directed by your health care provider. This can help prevent neuropathy from getting worse.  Check your skin  and feet every day for cuts, bruises, redness, blisters, or sores.  Do not use any products that contain nicotine or tobacco, such as cigarettes and e-cigarettes. If you need help quitting, ask your health care provider. This information is not intended to replace advice given to you by your health care provider. Make sure you discuss any questions you have with your health care provider. Document Revised: 02/09/2017 Document Reviewed: 02/02/2016 Elsevier Patient Education  2020 [employer]

## 2019-08-13 NOTE — Progress Notes (Signed)
Subjective: Thomas Joseph presents today for for annual diabetic foot examination and painful thick toenails that are difficult to trim. Pain interferes with ambulation. Aggravating factors include wearing enclosed shoe gear. Pain is relieved with periodic professional debridement.Mila Palmer, MD is patient's PCP. Last visit   Past Medical History:  Diagnosis Date  . Anemia   . BPH (benign prostatic hyperplasia)   . Diabetes mellitus without complication (HCC)   . ED (erectile dysfunction)   . Family history of adverse reaction to anesthesia    son had some problem years ago, pt unsure of what exact problem was  . Hypertension   . Obesity   . Pneumonia     Patient Active Problem List   Diagnosis Date Noted  . History of COVID-19 04/11/2019  . Shortness of breath 04/11/2019  . Irregular heart rhythm 04/11/2019  . Fatigue 04/11/2019  . Generalized weakness 04/11/2019  . Acute respiratory failure with hypoxia (HCC) 02/02/2019  . Essential hypertension 02/02/2019  . Type 2 diabetes mellitus with complication, without long-term current use of insulin (HCC) 02/02/2019  . Hypokalemia 02/02/2019  . BPH (benign prostatic hyperplasia) 12/26/2014    Past Surgical History:  Procedure Laterality Date  .  LEFT KNEE ARTHROCOPY    . HERNIA REPAIR    . TRANSURETHRAL RESECTION OF PROSTATE N/A 12/26/2014   Procedure: TRANSURETHRAL RESECTION OF THE PROSTATE;  Surgeon: Malen Gauze, MD;  Location: [facility];  Service: Urology;  Laterality: N/A;    Current Outpatient Medications on File Prior to Visit  Medication Sig Dispense Refill  . acetaminophen (TYLENOL) 325 MG tablet Take 325-650 mg by mouth every 12 (twelve) hours as needed for mild pain or headache.    Marland Kitchen amLODipine (NORVASC) 10 MG tablet Take 10 mg by mouth daily.    Marland Kitchen aspirin 81 MG chewable tablet Chew 81 mg by mouth every other day.     . cetirizine (ZYRTEC) 10 MG tablet Take 10 mg by mouth daily as needed for  allergies.    . hydrocortisone (ANUSOL-HC) 25 MG suppository Place 1 suppository (25 mg total) rectally 2 (two) times daily. 12 suppository 0  . latanoprost (XALATAN) 0.005 % ophthalmic solution Place 1 drop into both eyes at bedtime.    Marland Kitchen lisinopril-hydrochlorothiazide (ZESTORETIC) 20-12.5 MG tablet Take 1 tablet by mouth daily.    . meloxicam (MOBIC) 15 MG tablet Take 15 mg by mouth daily as needed.    . metFORMIN (GLUCOPHAGE-XR) 500 MG 24 hr tablet Take 1,000 mg by mouth daily.     . tadalafil (CIALIS) 20 MG tablet Take 20 mg by mouth as needed.    . [DISCONTINUED] benazepril (LOTENSIN) 40 MG tablet Take 40 mg by mouth daily.    . [DISCONTINUED] zolpidem (AMBIEN) 10 MG tablet Take 10 mg by mouth at bedtime as needed for sleep.      No current facility-administered medications on file prior to visit.     Allergies  Allergen Reactions  . Hydrochlorothiazide Other (See Comments)    Reaction not recalled, believes it just made him urinate very frequently  . Lipitor [Atorvastatin] Other (See Comments)    Muscle pain  . Viagra [Sildenafil Citrate] Palpitations    Social History   Occupational History  . Not on file  Tobacco Use  . Smoking status: Former Smoker    Quit date: 11/28/1984    Years since quitting: 34.7  . Smokeless tobacco: Never Used  Substance and Sexual Activity  . Alcohol use: Not  Currently    Comment: occasional glass of wine  . Drug use: No  . Sexual activity: Not on file    Family History  Problem Relation Age of Onset  . CVA Father   . Hypertension Father   . Leukemia Sister   . Thyroid disease Sister   . Cancer Brother     Immunization History  Administered Date(s) Administered  . PFIZER SARS-COV-2 Vaccination 01/24/2019, 04/18/2019     Objective: There were no vitals filed for this visit.  [name] is a pleasant 86 y.o. male in NAD. AAO X 3.  Vascular Examination: Capillary fill time to digits <3 seconds b/l lower extremities.  Palpable DP pulse(s) b/l lower extremities Nonpalpable PT pulse(s) b/l lower extremities. Lower extremity skin temperature gradient within normal limits. Nonpitting edema noted left ankle and right ankle.  Dermatological Examination: Pedal skin with normal turgor, texture and tone bilaterally. No open wounds bilaterally. No interdigital macerations bilaterally. Toenails L hallux, L 3rd toe, L 4th toe, L 5th toe, R hallux, R 3rd toe, R 4th toe and R 5th toe elongated, discolored, dystrophic, thickened, and crumbly with subungual debris and tenderness to dorsal palpation.  Musculoskeletal Examination: Normal muscle strength 5/5 to all lower extremity muscle groups bilaterally. No pain crepitus or joint limitation noted with ROM b/l. Limited joint ROM to the dorsomedial aspect of left midfoot. Patient ambulates independent of any assistive aids.  Footwear Assessment: Does the patient wear appropriate shoes? Yes. Does the patient need inserts/orthotics? No.  Neurological Examination: Protective sensation intact 5/5 intact bilaterally with 10g monofilament b/l. Vibratory sensation diminished b/l. Proprioception intact bilaterally.  Hemoglobin A1C Latest Ref Rng & Units 02/02/2019  HGBA1C 4.8 - 5.6 % 7.0(H)  Some recent data might be hidden   Assessment: 1. Pain due to onychomycosis of toenails of both feet   2. Type II diabetes mellitus with peripheral circulatory disorder (HCC)   3. Primary osteoarthritis of left foot   4. Hallux valgus, acquired, bilateral   5. Encounter for diabetic foot exam (HCC)      Risk Categorization: Low Risk :  Patient has all of the following: Intact protective sensation No prior foot ulcer  No severe deformity Pedal pulses present  Plan: -Examined patient. -Diabetic foot examination performed on today's visit. -Toenails L hallux, L 3rd toe, L 4th toe, L 5th toe, R hallux, R 3rd toe, R 4th toe and R 5th toe debrided in length and girth without iatrogenic  bleeding with sterile nail nipper and dremel.  -Patient to report any pedal injuries to medical professional immediately. -Patient to continue soft, supportive shoe gear daily. -Patient/POA to call should there be question/concern in the interim.  Return in about 3 months (around 11/13/2019) for diabetic nail trim.  [name], DPM

## 2019-11-05 ENCOUNTER — Ambulatory Visit
Admission: EM | Admit: 2019-11-05 | Discharge: 2019-11-05 | Disposition: A | Discharge status: Home / Self Care | Payer: Medicare Other | Attending: Physician Assistant | Admitting: Physician Assistant

## 2019-11-05 DIAGNOSIS — L918 Other hypertrophic disorders of the skin: Secondary | ICD-10-CM | POA: Diagnosis not present

## 2019-11-05 NOTE — ED Provider Notes (Signed)
[facility]    CSN: [id number] Arrival date & time: 11/05/19  4098      History   Chief Complaint Chief Complaint  Patient presents with   skin tag    HPI Thomas Joseph is a 86 y.o. male.   86 year old male comes in for skin tag removal. States has had it for 15 years. Denies any changes in size, erythema, warmth. Denies bleeding, open wounds. States now noticing it more and can occasional have irritation due to location.      Past Medical History:  Diagnosis Date   Anemia    BPH (benign prostatic hyperplasia)    Diabetes mellitus without complication (HCC)    ED (erectile dysfunction)    Family history of adverse reaction to anesthesia    son had some problem years ago, pt unsure of what exact problem was   Hypertension    Obesity    Pneumonia     Patient Active Problem List   Diagnosis Date Noted   History of COVID-19 04/11/2019   Shortness of breath 04/11/2019   Irregular heart rhythm 04/11/2019   Fatigue 04/11/2019   Generalized weakness 04/11/2019   Acute respiratory failure with hypoxia (HCC) 02/02/2019   Essential hypertension 02/02/2019   Type 2 diabetes mellitus with complication, without long-term current use of insulin (HCC) 02/02/2019   Hypokalemia 02/02/2019   BPH (benign prostatic hyperplasia) 12/26/2014    Past Surgical History:  Procedure Laterality Date    LEFT KNEE ARTHROCOPY     HERNIA REPAIR     TRANSURETHRAL RESECTION OF PROSTATE N/A 12/26/2014   Procedure: TRANSURETHRAL RESECTION OF THE PROSTATE;  Surgeon: Malen Gauze, MD;  Location: [facility];  Service: Urology;  Laterality: N/A;       Home Medications    Prior to Admission medications   Medication Sig Start Date End Date Taking? Authorizing Provider  acetaminophen (TYLENOL) 325 MG tablet Take 325-650 mg by mouth every 12 (twelve) hours as needed for mild pain or headache.    [provider]  amLODipine (NORVASC) 10 MG tablet  Take 10 mg by mouth daily.    [provider]  aspirin 81 MG chewable tablet Chew 81 mg by mouth every other day.     [provider]  cetirizine (ZYRTEC) 10 MG tablet Take 10 mg by mouth daily as needed for allergies.    [provider]  hydrocortisone (ANUSOL-HC) 25 MG suppository Place 1 suppository (25 mg total) rectally 2 (two) times daily. 04/15/19   Cathie Hoops, Rafaella Kole V, PA-C  latanoprost (XALATAN) 0.005 % ophthalmic solution Place 1 drop into both eyes at bedtime. 01/19/19   [provider]  lisinopril-hydrochlorothiazide (ZESTORETIC) 20-12.5 MG tablet Take 1 tablet by mouth daily. 02/15/19   [provider]  meloxicam (MOBIC) 15 MG tablet Take 15 mg by mouth daily as needed. 03/28/19   [provider]  metFORMIN (GLUCOPHAGE-XR) 500 MG 24 hr tablet Take 1,000 mg by mouth daily.     [provider]  tadalafil (CIALIS) 20 MG tablet Take 20 mg by mouth as needed. 08/03/19   [provider]  benazepril (LOTENSIN) 40 MG tablet Take 40 mg by mouth daily.  02/02/19  [provider]  zolpidem (AMBIEN) 10 MG tablet Take 10 mg by mouth at bedtime as needed for sleep.   02/02/19  [provider]    Family History Family History  Problem Relation Age of Onset   CVA Father  Hypertension Father    Leukemia Sister    Thyroid disease Sister    Cancer Brother     Social History Social History   Tobacco Use   Smoking status: Former Smoker    Quit date: 11/28/1984    Years since quitting: 34.9   Smokeless tobacco: Never Used  Substance Use Topics   Alcohol use: Not Currently    Comment: occasional glass of wine   Drug use: No     Allergies   Hydrochlorothiazide, Lipitor [atorvastatin], and Viagra [sildenafil citrate]   Review of Systems Review of Systems  Reason unable to perform ROS: See HPI as above.     Physical Exam Triage Vital Signs ED Triage Vitals [11/05/19 1033]  Enc Vitals Group     BP  (!) 186/81     Pulse Rate 61     Resp 18     Temp 97.8 F (36.6 C)     Temp Source Oral     SpO2 96 %     Weight      Height      Head Circumference      Peak Flow      Pain Score 0     Pain Loc      Pain Edu?      Excl. in GC?    No data found.  Updated Vital Signs BP (!) 186/81 (BP Location: Left Arm)    Pulse 61    Temp 97.8 F (36.6 C) (Oral)    Resp 18    SpO2 96%   Physical Exam Constitutional:      General: He is not in acute distress.    Appearance: Normal appearance. He is well-developed. He is not toxic-appearing or diaphoretic.  HENT:     Head: Normocephalic and atraumatic.  Eyes:     Conjunctiva/sclera: Conjunctivae normal.     Pupils: Pupils are equal, round, and reactive to light.  Pulmonary:     Effort: Pulmonary effort is normal. No respiratory distress.  Musculoskeletal:     Cervical back: Normal range of motion and neck supple.  Skin:    General: Skin is warm and dry.     Comments: 0.5cm skin tag that is pedunculated to the left axilla. No surrounding erythema, warmth. No wounds.   Neurological:     Mental Status: He is alert and oriented to person, place, and time.      UC Treatments / Results  Labs (all labs ordered are listed, but only abnormal results are displayed) Labs Reviewed - No data to display  EKG   Radiology No results found.  Procedures Foreign Body Removal  Date/Time: 11/05/2019 12:21 PM Performed by: Belinda Fisher, PA-C Authorized by: Belinda Fisher, PA-C   Consent:    Consent obtained:  Verbal   Consent given by:  Patient   Risks discussed:  Bleeding, pain and incomplete removal   Alternatives discussed:  No treatment Location:    Location: axilla. Comments:     Area cleaned with chloroprep. No anesthetic applied. Removed skin tag with scissors. Minimal bleeding that is controlled after pressure. Antibiotic ointment applied with bulky dressing. Patient tolerated well with no immediate complications.   (including  critical care time)  Medications Ordered in UC Medications - No data to display  Initial Impression / Assessment and Plan / UC Course  I have reviewed the triage vital signs and the nursing notes.  Pertinent labs & imaging results that were available during my care of  the patient were reviewed by me and considered in my medical decision making (see chart for details).    Patient tolerated procedure well without immediate complications.  Skin tag removed.  Wound care instructions given.  Return precautions given.  Final Clinical Impressions(s) / UC Diagnoses   Final diagnoses:  Skin tag    ED Prescriptions    None     PDMP not reviewed this encounter.   Belinda Fisher, PA-C 11/05/19 1223

## 2019-11-05 NOTE — Discharge Instructions (Signed)
Skin tag removed. Keep wound clean and dry. You can clean gently with soap and water. Monitor for spreading redness, increased warmth, increased swelling, fever, follow up for reevaluation needed.

## 2019-11-05 NOTE — ED Triage Notes (Signed)
Pt states has a skin tag under lt arm for over 15 years but having irritation from it since last Wednesday.

## 2019-11-10 ENCOUNTER — Ambulatory Visit: Payer: Medicare Other | Attending: Internal Medicine

## 2019-11-10 DIAGNOSIS — Z23 Encounter for immunization: Secondary | ICD-10-CM

## 2019-11-10 NOTE — Progress Notes (Signed)
° °  Covid-19 Vaccination Clinic  Name:  Thomas Joseph    MRN: [id number] DOB: 03-28-1933  11/10/2019  Mr. Thomas Joseph was observed post Covid-19 immunization for 15 minutes without incident. He was provided with Vaccine Information Sheet and instruction to access the V-Safe system.   Mr. Thomas Joseph was instructed to call 911 with any severe reactions post vaccine:  Difficulty breathing   Swelling of face and throat   A fast heartbeat   A bad rash all over body   Dizziness and weakness

## 2019-11-14 ENCOUNTER — Other Ambulatory Visit: Payer: Self-pay

## 2019-11-14 ENCOUNTER — Ambulatory Visit: Payer: Medicare Other | Admitting: Podiatry

## 2019-11-14 ENCOUNTER — Encounter: Payer: Self-pay | Admitting: Podiatry

## 2019-11-14 DIAGNOSIS — M79675 Pain in left toe(s): Secondary | ICD-10-CM | POA: Diagnosis not present

## 2019-11-14 DIAGNOSIS — M79674 Pain in right toe(s): Secondary | ICD-10-CM | POA: Diagnosis not present

## 2019-11-14 DIAGNOSIS — M19072 Primary osteoarthritis, left ankle and foot: Secondary | ICD-10-CM

## 2019-11-14 DIAGNOSIS — B351 Tinea unguium: Secondary | ICD-10-CM

## 2019-11-14 DIAGNOSIS — M2011 Hallux valgus (acquired), right foot: Secondary | ICD-10-CM

## 2019-11-14 DIAGNOSIS — M2012 Hallux valgus (acquired), left foot: Secondary | ICD-10-CM

## 2019-11-14 DIAGNOSIS — E1151 Type 2 diabetes mellitus with diabetic peripheral angiopathy without gangrene: Secondary | ICD-10-CM

## 2019-11-18 NOTE — Progress Notes (Signed)
Subjective:  Patient ID: Thomas Joseph, male    DOB: [birth year],  MRN: [id number]  Thomas Joseph presents to clinic today for preventative diabetic foot care and painful thick toenails that are difficult to trim. Pain interferes with ambulation. Aggravating factors include wearing enclosed shoe gear. Pain is relieved with periodic professional debridement..  86 y.o. male presents with the above complaint.    Review of Systems: Negative except as noted in the HPI. Past Medical History:  Diagnosis Date  . Anemia   . BPH (benign prostatic hyperplasia)   . Diabetes mellitus without complication (HCC)   . ED (erectile dysfunction)   . Family history of adverse reaction to anesthesia    son had some problem years ago, pt unsure of what exact problem was  . Hypertension   . Obesity   . Pneumonia    Past Surgical History:  Procedure Laterality Date  .  LEFT KNEE ARTHROCOPY    . HERNIA REPAIR    . TRANSURETHRAL RESECTION OF PROSTATE N/A 12/26/2014   Procedure: TRANSURETHRAL RESECTION OF THE PROSTATE;  Surgeon: Malen Gauze, MD;  Location: [facility];  Service: Urology;  Laterality: N/A;    Current Outpatient Medications:  .  acetaminophen (TYLENOL) 325 MG tablet, Take 325-650 mg by mouth every 12 (twelve) hours as needed for mild pain or headache., Disp: , Rfl:  .  amLODipine (NORVASC) 10 MG tablet, Take 10 mg by mouth daily., Disp: , Rfl:  .  aspirin 81 MG chewable tablet, Chew 81 mg by mouth every other day. , Disp: , Rfl:  .  cetirizine (ZYRTEC) 10 MG tablet, Take 10 mg by mouth daily as needed for allergies., Disp: , Rfl:  .  hydrocortisone (ANUSOL-HC) 25 MG suppository, Place 1 suppository (25 mg total) rectally 2 (two) times daily., Disp: 12 suppository, Rfl: 0 .  latanoprost (XALATAN) 0.005 % ophthalmic solution, Place 1 drop into both eyes at bedtime., Disp: , Rfl:  .  lisinopril-hydrochlorothiazide (ZESTORETIC) 20-12.5 MG tablet, Take 1 tablet by mouth daily., Disp: ,  Rfl:  .  meloxicam (MOBIC) 15 MG tablet, Take 15 mg by mouth daily as needed., Disp: , Rfl:  .  metFORMIN (GLUCOPHAGE-XR) 500 MG 24 hr tablet, Take 1,000 mg by mouth daily. , Disp: , Rfl:  .  rosuvastatin (CRESTOR) 5 MG tablet, Take by mouth., Disp: , Rfl:  .  tadalafil (CIALIS) 20 MG tablet, Take 20 mg by mouth as needed., Disp: , Rfl:  Allergies  Allergen Reactions  . Hydrochlorothiazide Other (See Comments)    Reaction not recalled, believes it just made him urinate very frequently  . Lipitor [Atorvastatin] Other (See Comments)    Muscle pain  . Viagra [Sildenafil Citrate] Palpitations   Social History   Occupational History  . Not on file  Tobacco Use  . Smoking status: Former Smoker    Quit date: 11/28/1984    Years since quitting: 34.9  . Smokeless tobacco: Never Used  Substance and Sexual Activity  . Alcohol use: Not Currently    Comment: occasional glass of wine  . Drug use: No  . Sexual activity: Not on file    Objective:   Constitutional Thomas Joseph is a pleasant 86 y.o. African American male, in NAD. AAO x 3.   Vascular Capillary fill time to digits <3 seconds b/l lower extremities. Palpable DP pulse(s) b/l lower extremities Nonpalpable PT pulse(s) b/l lower extremities. Pedal hair sparse. Lower extremity skin temperature gradient within normal limits. No pain  with calf compression b/l. Trace edema noted left ankle and right ankle. No cyanosis or clubbing noted.  Neurologic Normal speech. Oriented to person, place, and time. Protective sensation intact 5/5 intact bilaterally with 10g monofilament b/l. Vibratory sensation intact b/l.  Dermatologic Pedal skin with normal turgor, texture and tone bilaterally. No open wounds bilaterally. No interdigital macerations bilaterally. Toenails L hallux, L 3rd toe, L 4th toe, L 5th toe, R hallux, R 3rd toe, R 4th toe and R 5th toe elongated, discolored, dystrophic, thickened, and crumbly with subungual debris and tenderness to  dorsal palpation. Anonychia noted L 2nd toe and R 2nd toe. Nailbed(s) epithelialized.   Orthopedic: Normal muscle strength 5/5 to all lower extremity muscle groups bilaterally. Hallux valgus with bunion deformity noted b/l lower extremities. Limited joint ROM to the left foot. Patient ambulates independent of any assistive aids.   Radiographs: None Assessment:   1. Pain due to onychomycosis of toenails of both feet   2. Type II diabetes mellitus with peripheral circulatory disorder (HCC)   3. Primary osteoarthritis of left foot   4. Hallux valgus, acquired, bilateral    Plan:  Patient was evaluated and treated and all questions answered.  Onychomycosis with pain -Nails palliatively debridement as below -Educated on self-care  Procedure: Nail Debridement Rationale: Pain Type of Debridement: manual, sharp debridement. Instrumentation: Nail nipper, rotary burr. Number of Nails: 8 -Examined patient. -Continue diabetic foot care principles. -Patient to continue soft, supportive shoe gear daily. -Toenails L hallux, L 3rd toe, L 4th toe, L 5th toe, R hallux, R 3rd toe, R 4th toe and R 5th toe debrided in length and girth without iatrogenic bleeding with sterile nail nipper and dremel.  -Patient to report any pedal injuries to medical professional immediately. -Patient/POA to call should there be question/concern in the interim.  Return in about 3 months (around 02/14/2020) for diabetic foot care.  Thomas Joseph, DPM

## 2020-02-22 ENCOUNTER — Ambulatory Visit: Payer: Medicare Other | Admitting: Podiatry

## 2020-03-07 ENCOUNTER — Encounter: Payer: Self-pay | Admitting: Podiatrist

## 2020-03-07 ENCOUNTER — Ambulatory Visit: Payer: Medicare Other | Admitting: Podiatrist

## 2020-03-07 ENCOUNTER — Other Ambulatory Visit: Payer: Self-pay

## 2020-03-07 DIAGNOSIS — M5116 Intervertebral disc disorders with radiculopathy, lumbar region: Secondary | ICD-10-CM | POA: Insufficient documentation

## 2020-03-07 DIAGNOSIS — J1281 Pneumonia due to SARS-associated coronavirus: Secondary | ICD-10-CM | POA: Insufficient documentation

## 2020-03-07 DIAGNOSIS — R1031 Right lower quadrant pain: Secondary | ICD-10-CM | POA: Insufficient documentation

## 2020-03-07 DIAGNOSIS — M2012 Hallux valgus (acquired), left foot: Secondary | ICD-10-CM

## 2020-03-07 DIAGNOSIS — R2689 Other abnormalities of gait and mobility: Secondary | ICD-10-CM | POA: Insufficient documentation

## 2020-03-07 DIAGNOSIS — D692 Other nonthrombocytopenic purpura: Secondary | ICD-10-CM | POA: Insufficient documentation

## 2020-03-07 DIAGNOSIS — N1831 Chronic kidney disease, stage 3a: Secondary | ICD-10-CM | POA: Insufficient documentation

## 2020-03-07 DIAGNOSIS — M129 Arthropathy, unspecified: Secondary | ICD-10-CM | POA: Insufficient documentation

## 2020-03-07 DIAGNOSIS — I700 Atherosclerosis of aorta: Secondary | ICD-10-CM | POA: Insufficient documentation

## 2020-03-07 DIAGNOSIS — E038 Other specified hypothyroidism: Secondary | ICD-10-CM | POA: Insufficient documentation

## 2020-03-07 DIAGNOSIS — E1151 Type 2 diabetes mellitus with diabetic peripheral angiopathy without gangrene: Secondary | ICD-10-CM | POA: Diagnosis not present

## 2020-03-07 DIAGNOSIS — M2011 Hallux valgus (acquired), right foot: Secondary | ICD-10-CM

## 2020-03-07 DIAGNOSIS — Z9079 Acquired absence of other genital organ(s): Secondary | ICD-10-CM | POA: Insufficient documentation

## 2020-03-07 DIAGNOSIS — M19072 Primary osteoarthritis, left ankle and foot: Secondary | ICD-10-CM

## 2020-03-07 DIAGNOSIS — Z860100 Personal history of colon polyps, unspecified: Secondary | ICD-10-CM | POA: Insufficient documentation

## 2020-03-07 DIAGNOSIS — B351 Tinea unguium: Secondary | ICD-10-CM | POA: Diagnosis not present

## 2020-03-07 DIAGNOSIS — M79675 Pain in left toe(s): Secondary | ICD-10-CM | POA: Diagnosis not present

## 2020-03-07 DIAGNOSIS — Z6832 Body mass index (BMI) 32.0-32.9, adult: Secondary | ICD-10-CM | POA: Insufficient documentation

## 2020-03-07 DIAGNOSIS — E785 Hyperlipidemia, unspecified: Secondary | ICD-10-CM | POA: Insufficient documentation

## 2020-03-07 DIAGNOSIS — I771 Stricture of artery: Secondary | ICD-10-CM | POA: Insufficient documentation

## 2020-03-07 DIAGNOSIS — M5136 Other intervertebral disc degeneration, lumbar region: Secondary | ICD-10-CM | POA: Insufficient documentation

## 2020-03-07 DIAGNOSIS — R110 Nausea: Secondary | ICD-10-CM | POA: Insufficient documentation

## 2020-03-07 DIAGNOSIS — M79674 Pain in right toe(s): Secondary | ICD-10-CM

## 2020-03-07 DIAGNOSIS — D649 Anemia, unspecified: Secondary | ICD-10-CM | POA: Insufficient documentation

## 2020-03-07 DIAGNOSIS — H401111 Primary open-angle glaucoma, right eye, mild stage: Secondary | ICD-10-CM | POA: Insufficient documentation

## 2020-03-07 DIAGNOSIS — E1121 Type 2 diabetes mellitus with diabetic nephropathy: Secondary | ICD-10-CM | POA: Insufficient documentation

## 2020-03-07 DIAGNOSIS — J329 Chronic sinusitis, unspecified: Secondary | ICD-10-CM | POA: Insufficient documentation

## 2020-03-07 DIAGNOSIS — B948 Sequelae of other specified infectious and parasitic diseases: Secondary | ICD-10-CM | POA: Insufficient documentation

## 2020-03-07 DIAGNOSIS — U071 COVID-19: Secondary | ICD-10-CM | POA: Insufficient documentation

## 2020-03-07 DIAGNOSIS — R3129 Other microscopic hematuria: Secondary | ICD-10-CM | POA: Insufficient documentation

## 2020-03-07 DIAGNOSIS — K5900 Constipation, unspecified: Secondary | ICD-10-CM | POA: Insufficient documentation

## 2020-03-07 DIAGNOSIS — N2581 Secondary hyperparathyroidism of renal origin: Secondary | ICD-10-CM | POA: Insufficient documentation

## 2020-03-07 DIAGNOSIS — Z6834 Body mass index (BMI) 34.0-34.9, adult: Secondary | ICD-10-CM | POA: Insufficient documentation

## 2020-03-07 HISTORY — DX: Chronic kidney disease, stage 3a: N18.31

## 2020-03-10 ENCOUNTER — Encounter: Payer: Self-pay | Admitting: Podiatrist

## 2020-03-10 NOTE — Progress Notes (Signed)
Chief Complaint  Patient presents with  . Nail Problem    Nail trim 1-5 bilateral     HPI: Patient is 87 y.o. male who presents today for preventative diabetic foot care and painful thick toenails that are difficult to trim. Pain interferes with ambulation. Aggravating factors include wearing enclosed shoe gear. Pain is relieved with periodic professional debridement..    Patient Active Problem List   Diagnosis Date Noted  . 2019 novel coronavirus disease (COVID-19) 03/07/2020  . Anemia 03/07/2020  . Arthropathy 03/07/2020  . Body mass index (BMI) 32.0-32.9, adult 03/07/2020  . Body mass index (BMI) 34.0-34.9, adult 03/07/2020  . Chronic kidney disease (CKD) stage G3a/A1, moderately decreased glomerular filtration rate (GFR) between 45-59 mL/min/1.73 square meter and albuminuria creatinine ratio less than 30 mg/g (HCC) 03/07/2020  . Chronic sinusitis 03/07/2020  . Constipation 03/07/2020  . Diabetic renal disease (HCC) 03/07/2020  . Hardening of the aorta (main artery of the heart) (HCC) 03/07/2020  . Hyperlipidemia 03/07/2020  . Microscopic hematuria 03/07/2020  . Nausea 03/07/2020  . Other abnormalities of gait and mobility 03/07/2020  . Personal history of colonic polyps 03/07/2020  . Pneumonia due to SARS-associated coronavirus 03/07/2020  . Primary open-angle glaucoma, right eye, mild stage 03/07/2020  . Degeneration of lumbar intervertebral disc 03/07/2020  . Radiculopathy due to lumbar intervertebral disc disorder 03/07/2020  . Right lower quadrant pain 03/07/2020  . S/P TURP (status post transurethral resection of prostate) 03/07/2020  . Secondary hyperparathyroidism of renal origin (HCC) 03/07/2020  . Senile purpura (HCC) 03/07/2020  . Sequelae of other specified infectious and parasitic diseases 03/07/2020  . Stricture of artery (HCC) 03/07/2020  . Subclinical hypothyroidism 03/07/2020  . History of COVID-19 04/11/2019  . Shortness of breath 04/11/2019  . Irregular  heart rhythm 04/11/2019  . Fatigue 04/11/2019  . Generalized weakness 04/11/2019  . Acute respiratory failure with hypoxia (HCC) 02/02/2019  . Essential hypertension 02/02/2019  . Type 2 diabetes mellitus with complication, without long-term current use of insulin (HCC) 02/02/2019  . Hypokalemia 02/02/2019  . BPH (benign prostatic hyperplasia) 12/26/2014    Current Outpatient Medications on File Prior to Visit  Medication Sig Dispense Refill  . acetaminophen (TYLENOL) 325 MG tablet Take 325-650 mg by mouth every 12 (twelve) hours as needed for mild pain or headache.    Marland Kitchen amLODipine (NORVASC) 10 MG tablet Take 10 mg by mouth daily.    . Ascorbic Acid (VITAMIN C) 500 MG CAPS     . aspirin 81 MG chewable tablet Chew 81 mg by mouth every other day.     . Blood Pressure Monitor DEVI     . cetirizine (ZYRTEC) 10 MG tablet Take 10 mg by mouth daily as needed for allergies.    . hydrocortisone (ANUSOL-HC) 25 MG suppository Place 1 suppository (25 mg total) rectally 2 (two) times daily. 12 suppository 0  . latanoprost (XALATAN) 0.005 % ophthalmic solution Place 1 drop into both eyes at bedtime.    Marland Kitchen lisinopril-hydrochlorothiazide (ZESTORETIC) 20-12.5 MG tablet Take 1 tablet by mouth daily.    . meloxicam (MOBIC) 15 MG tablet Take 15 mg by mouth daily as needed.    . metFORMIN (GLUCOPHAGE-XR) 500 MG 24 hr tablet Take 1,000 mg by mouth daily.     . rosuvastatin (CRESTOR) 5 MG tablet Take by mouth.    . tadalafil (CIALIS) 20 MG tablet Take 20 mg by mouth as needed.    . zinc sulfate 220 (50 Zn) MG capsule     . [  DISCONTINUED] benazepril (LOTENSIN) 40 MG tablet Take 40 mg by mouth daily.    . [DISCONTINUED] zolpidem (AMBIEN) 10 MG tablet Take 10 mg by mouth at bedtime as needed for sleep.      No current facility-administered medications on file prior to visit.    Allergies  Allergen Reactions  . Hydrochlorothiazide Other (See Comments)    Reaction not recalled, believes it just made him  urinate very frequently  . Lipitor [Atorvastatin] Other (See Comments)    Muscle pain  . Tizanidine Hcl Other (See Comments)  . Viagra [Sildenafil Citrate] Palpitations    Review of Systems No fevers, chills, nausea, muscle aches, no difficulty breathing, no calf pain, no chest pain or shortness of breath.   Physical Exam  Constitutional Thomas Joseph is a pleasant 87 y.o. African American male, in NAD. AAO x 3.  Vascular Capillary fill time to digits <3 seconds b/l lower extremities. Palpable DP pulse(s) b/l lower extremities Nonpalpable PT pulse(s) b/l lower extremities. Pedal hair sparse. Lower extremity skin temperature gradient within normal limits. No pain with calf compression b/l. Trace edema noted left ankle and right ankle. No cyanosis or clubbing noted. Neurologic Normal speech. Oriented to person, place, and time. Protective sensation intact 5/5 intact bilaterally with 10g monofilament b/l. Vibratory sensation intact b/l. Dermatologic Pedal skin with normal turgor, texture and tone bilaterally. No open wounds bilaterally. No interdigital macerations bilaterally. Toenails L hallux, L 3rd toe, L 4th toe, L 5th toe, R hallux, R 3rd toe, R 4th toe and R 5th toe elongated, discolored, dystrophic, thickened, and crumbly with subungual debris and tenderness to dorsal palpation. Anonychia noted L 2nd toe and R 2nd toe. Nailbed(s) epithelialized.  Orthopedic: Normal muscle strength 5/5 to all lower extremity muscle groups bilaterally. Hallux valgus with bunion deformity noted b/l lower extremities. Limited joint ROM to the left foot. Patient ambulates independent of any assistive aids.    Assessment     ICD-10-CM   1. Pain due to onychomycosis of toenails of both feet  B35.1    M79.675    M79.674   2. Type II diabetes mellitus with peripheral circulatory disorder (HCC)  E11.51   3. Primary osteoarthritis of left foot  M19.072   4. Hallux valgus, acquired, bilateral  M20.11     M20.12      Plan  Debridement of toenails was recommended.  Onychoreduction of symptomatic toenails was performed via nail nipper and power burr without iatrogenic incident.  Patient was instructed on signs and symptoms of infection and was told to call immediately should any of these arise.   He will return for preventive services in 3 months with Dr. Eloy End.

## 2020-03-11 DIAGNOSIS — E119 Type 2 diabetes mellitus without complications: Secondary | ICD-10-CM | POA: Diagnosis not present

## 2020-03-14 DIAGNOSIS — I10 Essential (primary) hypertension: Secondary | ICD-10-CM | POA: Diagnosis not present

## 2020-04-04 DIAGNOSIS — R946 Abnormal results of thyroid function studies: Secondary | ICD-10-CM | POA: Diagnosis not present

## 2020-04-04 DIAGNOSIS — I10 Essential (primary) hypertension: Secondary | ICD-10-CM | POA: Diagnosis not present

## 2020-04-04 DIAGNOSIS — E038 Other specified hypothyroidism: Secondary | ICD-10-CM | POA: Diagnosis not present

## 2020-04-04 DIAGNOSIS — R5383 Other fatigue: Secondary | ICD-10-CM | POA: Diagnosis not present

## 2020-04-23 DIAGNOSIS — M353 Polymyalgia rheumatica: Secondary | ICD-10-CM | POA: Diagnosis not present

## 2020-05-05 ENCOUNTER — Other Ambulatory Visit (HOSPITAL_COMMUNITY): Payer: Medicare Other

## 2020-05-05 ENCOUNTER — Inpatient Hospital Stay (HOSPITAL_COMMUNITY): Payer: Medicare Other

## 2020-05-05 ENCOUNTER — Inpatient Hospital Stay (HOSPITAL_COMMUNITY)
Admission: EM | Admit: 2020-05-05 | Discharge: 2020-05-20 | DRG: 356 | Disposition: A | Discharge status: Home / Self Care | Payer: Medicare Other | Attending: Internal Medicine | Admitting: Internal Medicine

## 2020-05-05 ENCOUNTER — Other Ambulatory Visit: Payer: Self-pay

## 2020-05-05 DIAGNOSIS — T80818A Extravasation of other vesicant agent, initial encounter: Secondary | ICD-10-CM | POA: Diagnosis not present

## 2020-05-05 DIAGNOSIS — Z6829 Body mass index (BMI) 29.0-29.9, adult: Secondary | ICD-10-CM

## 2020-05-05 DIAGNOSIS — I130 Hypertensive heart and chronic kidney disease with heart failure and stage 1 through stage 4 chronic kidney disease, or unspecified chronic kidney disease: Secondary | ICD-10-CM | POA: Diagnosis not present

## 2020-05-05 DIAGNOSIS — N1831 Chronic kidney disease, stage 3a: Secondary | ICD-10-CM | POA: Diagnosis not present

## 2020-05-05 DIAGNOSIS — K644 Residual hemorrhoidal skin tags: Secondary | ICD-10-CM | POA: Diagnosis present

## 2020-05-05 DIAGNOSIS — D696 Thrombocytopenia, unspecified: Secondary | ICD-10-CM | POA: Diagnosis present

## 2020-05-05 DIAGNOSIS — I82451 Acute embolism and thrombosis of right peroneal vein: Secondary | ICD-10-CM | POA: Diagnosis present

## 2020-05-05 DIAGNOSIS — Z8249 Family history of ischemic heart disease and other diseases of the circulatory system: Secondary | ICD-10-CM

## 2020-05-05 DIAGNOSIS — I82442 Acute embolism and thrombosis of left tibial vein: Secondary | ICD-10-CM | POA: Diagnosis present

## 2020-05-05 DIAGNOSIS — Z823 Family history of stroke: Secondary | ICD-10-CM

## 2020-05-05 DIAGNOSIS — N179 Acute kidney failure, unspecified: Secondary | ICD-10-CM | POA: Insufficient documentation

## 2020-05-05 DIAGNOSIS — J9601 Acute respiratory failure with hypoxia: Secondary | ICD-10-CM | POA: Diagnosis not present

## 2020-05-05 DIAGNOSIS — I10 Essential (primary) hypertension: Secondary | ICD-10-CM | POA: Diagnosis not present

## 2020-05-05 DIAGNOSIS — R110 Nausea: Secondary | ICD-10-CM | POA: Diagnosis not present

## 2020-05-05 DIAGNOSIS — D62 Acute posthemorrhagic anemia: Secondary | ICD-10-CM | POA: Diagnosis present

## 2020-05-05 DIAGNOSIS — I313 Pericardial effusion (noninflammatory): Secondary | ICD-10-CM | POA: Diagnosis present

## 2020-05-05 DIAGNOSIS — Z9889 Other specified postprocedural states: Secondary | ICD-10-CM | POA: Diagnosis not present

## 2020-05-05 DIAGNOSIS — E1122 Type 2 diabetes mellitus with diabetic chronic kidney disease: Secondary | ICD-10-CM | POA: Diagnosis present

## 2020-05-05 DIAGNOSIS — I272 Pulmonary hypertension, unspecified: Secondary | ICD-10-CM | POA: Diagnosis not present

## 2020-05-05 DIAGNOSIS — Z87891 Personal history of nicotine dependence: Secondary | ICD-10-CM | POA: Diagnosis not present

## 2020-05-05 DIAGNOSIS — K5730 Diverticulosis of large intestine without perforation or abscess without bleeding: Secondary | ICD-10-CM | POA: Diagnosis not present

## 2020-05-05 DIAGNOSIS — K921 Melena: Secondary | ICD-10-CM | POA: Diagnosis not present

## 2020-05-05 DIAGNOSIS — Z806 Family history of leukemia: Secondary | ICD-10-CM

## 2020-05-05 DIAGNOSIS — D72829 Elevated white blood cell count, unspecified: Secondary | ICD-10-CM | POA: Diagnosis present

## 2020-05-05 DIAGNOSIS — K5900 Constipation, unspecified: Secondary | ICD-10-CM | POA: Diagnosis not present

## 2020-05-05 DIAGNOSIS — K5731 Diverticulosis of large intestine without perforation or abscess with bleeding: Secondary | ICD-10-CM | POA: Diagnosis not present

## 2020-05-05 DIAGNOSIS — E663 Overweight: Secondary | ICD-10-CM | POA: Diagnosis present

## 2020-05-05 DIAGNOSIS — Y848 Other medical procedures as the cause of abnormal reaction of the patient, or of later complication, without mention of misadventure at the time of the procedure: Secondary | ICD-10-CM | POA: Diagnosis present

## 2020-05-05 DIAGNOSIS — E785 Hyperlipidemia, unspecified: Secondary | ICD-10-CM | POA: Diagnosis present

## 2020-05-05 DIAGNOSIS — Z8616 Personal history of COVID-19: Secondary | ICD-10-CM | POA: Diagnosis not present

## 2020-05-05 DIAGNOSIS — R58 Hemorrhage, not elsewhere classified: Secondary | ICD-10-CM | POA: Diagnosis not present

## 2020-05-05 DIAGNOSIS — D649 Anemia, unspecified: Secondary | ICD-10-CM | POA: Diagnosis not present

## 2020-05-05 DIAGNOSIS — E1165 Type 2 diabetes mellitus with hyperglycemia: Secondary | ICD-10-CM | POA: Diagnosis not present

## 2020-05-05 DIAGNOSIS — S5012XA Contusion of left forearm, initial encounter: Secondary | ICD-10-CM | POA: Diagnosis not present

## 2020-05-05 DIAGNOSIS — I4891 Unspecified atrial fibrillation: Secondary | ICD-10-CM | POA: Diagnosis present

## 2020-05-05 DIAGNOSIS — K222 Esophageal obstruction: Secondary | ICD-10-CM | POA: Diagnosis present

## 2020-05-05 DIAGNOSIS — Z86010 Personal history of colonic polyps: Secondary | ICD-10-CM

## 2020-05-05 DIAGNOSIS — Z888 Allergy status to other drugs, medicaments and biological substances status: Secondary | ICD-10-CM

## 2020-05-05 DIAGNOSIS — E872 Acidosis: Secondary | ICD-10-CM | POA: Diagnosis not present

## 2020-05-05 DIAGNOSIS — I5032 Chronic diastolic (congestive) heart failure: Secondary | ICD-10-CM | POA: Diagnosis present

## 2020-05-05 DIAGNOSIS — K640 First degree hemorrhoids: Secondary | ICD-10-CM | POA: Diagnosis present

## 2020-05-05 DIAGNOSIS — I480 Paroxysmal atrial fibrillation: Secondary | ICD-10-CM | POA: Diagnosis not present

## 2020-05-05 DIAGNOSIS — K922 Gastrointestinal hemorrhage, unspecified: Principal | ICD-10-CM | POA: Diagnosis present

## 2020-05-05 DIAGNOSIS — I2699 Other pulmonary embolism without acute cor pulmonale: Secondary | ICD-10-CM | POA: Diagnosis present

## 2020-05-05 DIAGNOSIS — Z7982 Long term (current) use of aspirin: Secondary | ICD-10-CM

## 2020-05-05 DIAGNOSIS — R7989 Other specified abnormal findings of blood chemistry: Secondary | ICD-10-CM | POA: Diagnosis present

## 2020-05-05 DIAGNOSIS — E1121 Type 2 diabetes mellitus with diabetic nephropathy: Secondary | ICD-10-CM

## 2020-05-05 DIAGNOSIS — N281 Cyst of kidney, acquired: Secondary | ICD-10-CM | POA: Diagnosis not present

## 2020-05-05 DIAGNOSIS — R42 Dizziness and giddiness: Secondary | ICD-10-CM | POA: Diagnosis not present

## 2020-05-05 DIAGNOSIS — Z8349 Family history of other endocrine, nutritional and metabolic diseases: Secondary | ICD-10-CM

## 2020-05-05 DIAGNOSIS — K5791 Diverticulosis of intestine, part unspecified, without perforation or abscess with bleeding: Secondary | ICD-10-CM | POA: Diagnosis not present

## 2020-05-05 DIAGNOSIS — K449 Diaphragmatic hernia without obstruction or gangrene: Secondary | ICD-10-CM | POA: Diagnosis not present

## 2020-05-05 DIAGNOSIS — Z7984 Long term (current) use of oral hypoglycemic drugs: Secondary | ICD-10-CM

## 2020-05-05 DIAGNOSIS — Z743 Need for continuous supervision: Secondary | ICD-10-CM | POA: Diagnosis not present

## 2020-05-05 DIAGNOSIS — R404 Transient alteration of awareness: Secondary | ICD-10-CM | POA: Diagnosis not present

## 2020-05-05 DIAGNOSIS — E876 Hypokalemia: Secondary | ICD-10-CM | POA: Diagnosis not present

## 2020-05-05 DIAGNOSIS — Z79899 Other long term (current) drug therapy: Secondary | ICD-10-CM

## 2020-05-05 LAB — GLUCOSE, CAPILLARY
Glucose-Capillary: 147 mg/dL — ABNORMAL HIGH (ref 70–99)
Glucose-Capillary: 148 mg/dL — ABNORMAL HIGH (ref 70–99)

## 2020-05-05 LAB — CBC WITH DIFFERENTIAL/PLATELET
Abs Immature Granulocytes: 0.03 10*3/uL (ref 0.00–0.07)
Basophils Absolute: 0.1 10*3/uL (ref 0.0–0.1)
Basophils Relative: 1 %
Eosinophils Absolute: 0.1 10*3/uL (ref 0.0–0.5)
Eosinophils Relative: 1 %
HCT: 28.7 % — ABNORMAL LOW (ref 39.0–52.0)
Hemoglobin: 9.6 g/dL — ABNORMAL LOW (ref 13.0–17.0)
Immature Granulocytes: 0 %
Lymphocytes Relative: 13 %
Lymphs Abs: 1.2 10*3/uL (ref 0.7–4.0)
MCH: 31.5 pg (ref 26.0–34.0)
MCHC: 33.4 g/dL (ref 30.0–36.0)
MCV: 94.1 fL (ref 80.0–100.0)
Monocytes Absolute: 0.6 10*3/uL (ref 0.1–1.0)
Monocytes Relative: 7 %
Neutro Abs: 6.8 10*3/uL (ref 1.7–7.7)
Neutrophils Relative %: 78 %
Platelets: 185 10*3/uL (ref 150–400)
RBC: 3.05 MIL/uL — ABNORMAL LOW (ref 4.22–5.81)
RDW: 13.5 % (ref 11.5–15.5)
WBC: 8.7 10*3/uL (ref 4.0–10.5)
nRBC: 0 % (ref 0.0–0.2)

## 2020-05-05 LAB — FERRITIN: Ferritin: 91 ng/mL (ref 24–336)

## 2020-05-05 LAB — PREPARE RBC (CROSSMATCH)

## 2020-05-05 LAB — COMPREHENSIVE METABOLIC PANEL
ALT: 12 U/L (ref 0–44)
AST: 17 U/L (ref 15–41)
Albumin: 2.9 g/dL — ABNORMAL LOW (ref 3.5–5.0)
Alkaline Phosphatase: 50 U/L (ref 38–126)
Anion gap: 11 (ref 5–15)
BUN: 57 mg/dL — ABNORMAL HIGH (ref 8–23)
CO2: 22 mmol/L (ref 22–32)
Calcium: 7.9 mg/dL — ABNORMAL LOW (ref 8.9–10.3)
Chloride: 106 mmol/L (ref 98–111)
Creatinine, Ser: 1.81 mg/dL — ABNORMAL HIGH (ref 0.61–1.24)
GFR, Estimated: 36 mL/min — ABNORMAL LOW (ref >60)
Glucose, Bld: 173 mg/dL — ABNORMAL HIGH (ref 70–99)
Potassium: 3.6 mmol/L (ref 3.5–5.1)
Sodium: 139 mmol/L (ref 135–145)
Total Bilirubin: 0.9 mg/dL (ref 0.3–1.2)
Total Protein: 5.4 g/dL — ABNORMAL LOW (ref 6.5–8.1)

## 2020-05-05 LAB — RETICULOCYTES
Immature Retic Fract: 8.6 % (ref 2.3–15.9)
RBC.: 2.82 MIL/uL — ABNORMAL LOW (ref 4.22–5.81)
Retic Count, Absolute: 32.4 10*3/uL (ref 19.0–186.0)
Retic Ct Pct: 1.2 % (ref 0.4–3.1)

## 2020-05-05 LAB — IRON AND TIBC
Iron: 74 ug/dL (ref 45–182)
Saturation Ratios: 36 % (ref 17.9–39.5)
TIBC: 204 ug/dL — ABNORMAL LOW (ref 250–450)
UIBC: 130 ug/dL

## 2020-05-05 LAB — PROTIME-INR
INR: 1.1 (ref 0.8–1.2)
Prothrombin Time: 14.6 seconds (ref 11.4–15.2)

## 2020-05-05 LAB — MRSA PCR SCREENING: MRSA by PCR: NEGATIVE

## 2020-05-05 LAB — HEMOGLOBIN AND HEMATOCRIT, BLOOD
HCT: 27 % — ABNORMAL LOW (ref 39.0–52.0)
Hemoglobin: 9.3 g/dL — ABNORMAL LOW (ref 13.0–17.0)

## 2020-05-05 LAB — RESP PANEL BY RT-PCR (FLU A&B, COVID) ARPGX2
Influenza A by PCR: NEGATIVE
Influenza B by PCR: NEGATIVE
SARS Coronavirus 2 by RT PCR: NEGATIVE

## 2020-05-05 LAB — HEMOGLOBIN A1C
Hgb A1c MFr Bld: 7.2 % — ABNORMAL HIGH (ref 4.8–5.6)
Mean Plasma Glucose: 159.94 mg/dL

## 2020-05-05 MED ORDER — SODIUM CHLORIDE 0.9 % IV BOLUS
1000.0000 mL | Freq: Once | INTRAVENOUS | Status: AC
  Administered 2020-05-05: 1000 mL via INTRAVENOUS

## 2020-05-05 MED ORDER — PANTOPRAZOLE SODIUM 40 MG IV SOLR
40.0000 mg | Freq: Two times a day (BID) | INTRAVENOUS | Status: DC
  Administered 2020-05-05 – 2020-05-15 (×20): 40 mg via INTRAVENOUS
  Filled 2020-05-05 (×20): qty 40

## 2020-05-05 MED ORDER — PANTOPRAZOLE SODIUM 40 MG IV SOLR
8.0000 mg/h | INTRAVENOUS | Status: DC
Start: 2020-05-05 — End: 2020-05-05

## 2020-05-05 MED ORDER — SODIUM CHLORIDE 0.9 % IV SOLN: INTRAVENOUS | Status: DC

## 2020-05-05 MED ORDER — ACETAMINOPHEN 325 MG PO TABS: 325.0000 mg | ORAL_TABLET | Freq: Two times a day (BID) | ORAL | Status: DC | PRN

## 2020-05-05 MED ORDER — INSULIN ASPART 100 UNIT/ML ~~LOC~~ SOLN
0.0000 [IU] | Freq: Three times a day (TID) | SUBCUTANEOUS | Status: DC
  Administered 2020-05-06 (×2): 2 [IU] via SUBCUTANEOUS
  Administered 2020-05-07: 1 [IU] via SUBCUTANEOUS
  Administered 2020-05-07: 3 [IU] via SUBCUTANEOUS
  Administered 2020-05-07 – 2020-05-08 (×3): 1 [IU] via SUBCUTANEOUS
  Administered 2020-05-08 – 2020-05-09 (×2): 2 [IU] via SUBCUTANEOUS
  Administered 2020-05-09: 3 [IU] via SUBCUTANEOUS
  Administered 2020-05-09: 1 [IU] via SUBCUTANEOUS
  Administered 2020-05-10: 2 [IU] via SUBCUTANEOUS
  Administered 2020-05-10: 3 [IU] via SUBCUTANEOUS
  Administered 2020-05-10 – 2020-05-12 (×4): 2 [IU] via SUBCUTANEOUS
  Administered 2020-05-12: 3 [IU] via SUBCUTANEOUS
  Administered 2020-05-13 (×3): 2 [IU] via SUBCUTANEOUS
  Administered 2020-05-14: 3 [IU] via SUBCUTANEOUS
  Administered 2020-05-14: 1 [IU] via SUBCUTANEOUS
  Administered 2020-05-14 – 2020-05-17 (×6): 2 [IU] via SUBCUTANEOUS
  Administered 2020-05-17: 3 [IU] via SUBCUTANEOUS
  Administered 2020-05-17 – 2020-05-18 (×4): 1 [IU] via SUBCUTANEOUS
  Administered 2020-05-19 (×2): 2 [IU] via SUBCUTANEOUS
  Administered 2020-05-20: 3 [IU] via SUBCUTANEOUS

## 2020-05-05 MED ORDER — PANTOPRAZOLE SODIUM 40 MG IV SOLR
40.0000 mg | Freq: Two times a day (BID) | INTRAVENOUS | Status: DC
Start: 2020-05-09 — End: 2020-05-05

## 2020-05-05 MED ORDER — FUROSEMIDE 10 MG/ML IJ SOLN
20.0000 mg | Freq: Once | INTRAMUSCULAR | Status: AC
  Administered 2020-05-05: 20 mg via INTRAVENOUS
  Filled 2020-05-05: qty 2

## 2020-05-05 MED ORDER — PANTOPRAZOLE SODIUM 40 MG IV SOLR
40.0000 mg | Freq: Once | INTRAVENOUS | Status: AC
  Administered 2020-05-05: 40 mg via INTRAVENOUS
  Filled 2020-05-05: qty 40

## 2020-05-05 MED ORDER — LORATADINE 10 MG PO TABS
10.0000 mg | ORAL_TABLET | Freq: Every day | ORAL | Status: DC
  Administered 2020-05-06 – 2020-05-07 (×2): 10 mg via ORAL
  Filled 2020-05-05 (×2): qty 1

## 2020-05-05 MED ORDER — SODIUM CHLORIDE 0.9% IV SOLUTION: Freq: Once | INTRAVENOUS | Status: AC

## 2020-05-05 MED ORDER — HYDRALAZINE HCL 25 MG PO TABS: 25.0000 mg | ORAL_TABLET | Freq: Four times a day (QID) | ORAL | Status: DC | PRN

## 2020-05-05 MED ORDER — LATANOPROST 0.005 % OP SOLN
1.0000 [drp] | Freq: Every day | OPHTHALMIC | Status: DC
  Administered 2020-05-05 – 2020-05-19 (×15): 1 [drp] via OPHTHALMIC
  Filled 2020-05-05: qty 2.5

## 2020-05-05 MED ORDER — ROSUVASTATIN CALCIUM 5 MG PO TABS
5.0000 mg | ORAL_TABLET | Freq: Every day | ORAL | Status: DC
  Administered 2020-05-06 – 2020-05-07 (×2): 5 mg via ORAL
  Filled 2020-05-05 (×2): qty 1

## 2020-05-05 NOTE — ED Provider Notes (Signed)
[facility]   CSN: [id number] Arrival date & time: 05/05/20  1018     History Chief Complaint  Patient presents with  . GI Bleeding    Brought in by EMS from home c/o dark tarry stools onset yesterday with one episode of near syncope with standing. Feeling weak.[name].     Thomas Joseph is a 87 y.o. male.  87 yo M with a chief complaint of bloody stool.  This started last night he said he noticed that his stool was somewhat darker than typical.  This morning had a very large bowel movement that was also dark bit mixed with blood.  Has been feeling a bit weak and lightheaded this morning.  Denies abdominal pain denies nausea or vomiting.  Denies history of GI bleed.  Used to take a baby aspirin but stopped after his last doctor visit.  The history is provided by the patient.  Illness Severity:  Moderate Onset quality:  Gradual Duration:  1 day Timing:  Constant Progression:  Unchanged Chronicity:  New Associated symptoms: no abdominal pain, no chest pain, no congestion, no diarrhea, no fever, no headaches, no myalgias, no rash, no shortness of breath and no vomiting        Past Medical History:  Diagnosis Date  . Anemia   . BPH (benign prostatic hyperplasia)   . Chronic kidney disease (CKD) stage G3a/A1, moderately decreased glomerular filtration rate (GFR) between 45-59 mL/min/1.73 square meter and albuminuria creatinine ratio less than 30 mg/g (HCC) 03/07/2020  . Diabetes mellitus without complication (HCC)   . ED (erectile dysfunction)   . Family history of adverse reaction to anesthesia    son had some problem years ago, pt unsure of what exact problem was  . Hypertension   . Obesity   . Pneumonia     Patient Active Problem List   Diagnosis Date Noted  . GI bleed 05/05/2020  . AKI (acute kidney injury) (HCC)   . 2019 novel coronavirus disease (COVID-19) 03/07/2020  . Anemia 03/07/2020  . Arthropathy 03/07/2020   . Body mass index (BMI) 32.0-32.9, adult 03/07/2020  . Body mass index (BMI) 34.0-34.9, adult 03/07/2020  . Chronic kidney disease (CKD) stage G3a/A1, moderately decreased glomerular filtration rate (GFR) between 45-59 mL/min/1.73 square meter and albuminuria creatinine ratio less than 30 mg/g (HCC) 03/07/2020  . Chronic sinusitis 03/07/2020  . Constipation 03/07/2020  . Diabetic renal disease (HCC) 03/07/2020  . Hardening of the aorta (main artery of the heart) (HCC) 03/07/2020  . Hyperlipidemia 03/07/2020  . Microscopic hematuria 03/07/2020  . Nausea 03/07/2020  . Other abnormalities of gait and mobility 03/07/2020  . Personal history of colonic polyps 03/07/2020  . Pneumonia due to SARS-associated coronavirus 03/07/2020  . Primary open-angle glaucoma, right eye, mild stage 03/07/2020  . Degeneration of lumbar intervertebral disc 03/07/2020  . Radiculopathy due to lumbar intervertebral disc disorder 03/07/2020  . Right lower quadrant pain 03/07/2020  . S/P TURP (status post transurethral resection of prostate) 03/07/2020  . Secondary hyperparathyroidism of renal origin (HCC) 03/07/2020  . Senile purpura (HCC) 03/07/2020  . Sequelae of other specified infectious and parasitic diseases 03/07/2020  . Stricture of artery (HCC) 03/07/2020  . Subclinical hypothyroidism 03/07/2020  . History of COVID-19 04/11/2019  . Shortness of breath 04/11/2019  . Irregular heart rhythm 04/11/2019  . Fatigue 04/11/2019  . Generalized weakness 04/11/2019  . Acute respiratory failure with hypoxia (HCC) 02/02/2019  . Essential hypertension 02/02/2019  . Type  2 diabetes mellitus with complication, without long-term current use of insulin (HCC) 02/02/2019  . Hypokalemia 02/02/2019  . BPH (benign prostatic hyperplasia) 12/26/2014    Past Surgical History:  Procedure Laterality Date  .  LEFT KNEE ARTHROCOPY    . HERNIA REPAIR    . TRANSURETHRAL RESECTION OF PROSTATE N/A 12/26/2014   Procedure:  TRANSURETHRAL RESECTION OF THE PROSTATE;  Surgeon: Malen Gauze, MD;  Location: [facility];  Service: Urology;  Laterality: N/A;       Family History  Problem Relation Age of Onset  . CVA Father   . Hypertension Father   . Leukemia Sister   . Thyroid disease Sister   . Cancer Brother     Social History   Tobacco Use  . Smoking status: Former Smoker    Quit date: 11/28/1984    Years since quitting: 35.4  . Smokeless tobacco: Never Used  Substance Use Topics  . Alcohol use: Not Currently    Comment: occasional glass of wine  . Drug use: No    Home Medications Prior to Admission medications   Medication Sig Start Date End Date Taking? Authorizing Provider  acetaminophen (TYLENOL) 325 MG tablet Take 325-650 mg by mouth every 12 (twelve) hours as needed for mild pain or headache.   Yes [provider]  amLODipine (NORVASC) 10 MG tablet Take 10 mg by mouth daily.   Yes [provider]  Ascorbic Acid (VITAMIN C) 500 MG CAPS Take 1 tablet by mouth daily. 02/08/19  Yes [provider]  aspirin 81 MG chewable tablet Chew 81 mg by mouth every other day.    Yes [provider]  Blood Pressure Monitor DEVI  10/23/19  Yes [provider]  cetirizine (ZYRTEC) 10 MG tablet Take 10 mg by mouth daily as needed for allergies.   Yes [provider]  hydrocortisone (ANUSOL-HC) 25 MG suppository Place 1 suppository (25 mg total) rectally 2 (two) times daily. 04/15/19  Yes Yu, Amy V, PA-C  latanoprost (XALATAN) 0.005 % ophthalmic solution Place 1 drop into both eyes at bedtime. 01/19/19  Yes [provider]  lisinopril-hydrochlorothiazide (ZESTORETIC) 20-12.5 MG tablet Take 1 tablet by mouth daily. 02/15/19  Yes [provider]  meloxicam (MOBIC) 15 MG tablet Take 15 mg by mouth daily as needed. 03/28/19  Yes [provider]  metFORMIN (GLUCOPHAGE-XR) 500 MG 24 hr tablet Take 1,000 mg by mouth daily.    Yes [provider]  predniSONE (DELTASONE) 10 MG tablet Take 10 mg by mouth daily. 04/07/20  Yes [provider]  rosuvastatin (CRESTOR) 5 MG tablet Take 5 mg by mouth daily. 08/26/19  Yes [provider]  tadalafil (CIALIS) 20 MG tablet Take 20 mg by mouth daily as needed for erectile dysfunction. 08/03/19  Yes [provider]  zinc sulfate 220 (50 Zn) MG capsule Take 220 mg by mouth daily. 02/08/19  Yes [provider]  benazepril (LOTENSIN) 40 MG tablet Take 40 mg by mouth daily.  02/02/19  [provider]  zolpidem (AMBIEN) 10 MG tablet Take 10 mg by mouth at bedtime as needed for sleep.   02/02/19  [provider]    Allergies    Hydrochlorothiazide, Lipitor [atorvastatin], Tizanidine hcl, and Viagra [sildenafil citrate]  Review of Systems   Review of Systems  Constitutional: Negative for chills and fever.  HENT: Negative for congestion and facial swelling.   Eyes: Negative for discharge and visual disturbance.  Respiratory: Negative for shortness of  breath.   Cardiovascular: Negative for chest pain and palpitations.  Gastrointestinal: Negative for abdominal pain, diarrhea and vomiting.  Musculoskeletal: Negative for arthralgias and myalgias.  Skin: Negative for color change and rash.  Neurological: Negative for tremors, syncope and headaches.  Psychiatric/Behavioral: Negative for confusion and dysphoric mood.    Physical Exam Updated Vital Signs BP (!) 145/68 (BP Location: Left Arm)   Pulse 73   Temp 97.7 F (36.5 C) (Oral)   Resp 18   Ht 6\' 1"  (1.854 m)   Wt 102 kg   SpO2 98%   BMI 29.67 kg/m   Physical Exam Vitals and nursing note reviewed.  Constitutional:      Appearance: He is well-developed.  HENT:     Head: Normocephalic and atraumatic.  Eyes:     Pupils: Pupils are equal, round, and reactive to light.  Neck:     Vascular: No JVD.  Cardiovascular:     Rate and Rhythm: Normal rate and regular rhythm.     Heart  sounds: No murmur heard. No friction rub. No gallop.   Pulmonary:     Effort: No respiratory distress.     Breath sounds: No wheezing.  Abdominal:     General: There is no distension.     Tenderness: There is no abdominal tenderness. There is no guarding or rebound.  Genitourinary:    Comments: Gross melena Musculoskeletal:        General: Normal range of motion.     Cervical back: Normal range of motion and neck supple.  Skin:    Coloration: Skin is not pale.     Findings: No rash.  Neurological:     Mental Status: He is alert and oriented to person, place, and time.  Psychiatric:        Behavior: Behavior normal.     ED Results / Procedures / Treatments   Labs (all labs ordered are listed, but only abnormal results are displayed) Labs Reviewed  CBC WITH DIFFERENTIAL/PLATELET - Abnormal; Notable for the following components:      Result Value   RBC 3.05 (*)    Hemoglobin 9.6 (*)    HCT 28.7 (*)    All other components within normal limits  COMPREHENSIVE METABOLIC PANEL - Abnormal; Notable for the following components:   Glucose, Bld 173 (*)    BUN 57 (*)    Creatinine, Ser 1.81 (*)    Calcium 7.9 (*)    Total Protein 5.4 (*)    Albumin 2.9 (*)    GFR, Estimated 36 (*)    All other components within normal limits  IRON AND TIBC - Abnormal; Notable for the following components:   TIBC 204 (*)    All other components within normal limits  RETICULOCYTES - Abnormal; Notable for the following components:   RBC. 2.82 (*)    All other components within normal limits  HEMOGLOBIN A1C - Abnormal; Notable for the following components:   Hgb A1c MFr Bld 7.2 (*)    All other components within normal limits  GLUCOSE, CAPILLARY - Abnormal; Notable for the following components:   Glucose-Capillary 147 (*)    All other components within normal limits  BASIC METABOLIC PANEL - Abnormal; Notable for the following components:   CO2 18 (*)    Glucose, Bld 193 (*)    BUN 62 (*)     Creatinine, Ser 1.72 (*)    Calcium 7.5 (*)    GFR, Estimated 39 (*)    All  other components within normal limits  CBC - Abnormal; Notable for the following components:   WBC 13.7 (*)    RBC 2.65 (*)    Hemoglobin 8.2 (*)    HCT 24.5 (*)    All other components within normal limits  HEMOGLOBIN AND HEMATOCRIT, BLOOD - Abnormal; Notable for the following components:   Hemoglobin 9.3 (*)    HCT 27.0 (*)    All other components within normal limits  GLUCOSE, CAPILLARY - Abnormal; Notable for the following components:   Glucose-Capillary 148 (*)    All other components within normal limits  GLUCOSE, CAPILLARY - Abnormal; Notable for the following components:   Glucose-Capillary 136 (*)    All other components within normal limits  HEMOGLOBIN AND HEMATOCRIT, BLOOD - Abnormal; Notable for the following components:   Hemoglobin 8.3 (*)    HCT 24.2 (*)    All other components within normal limits  GLUCOSE, CAPILLARY - Abnormal; Notable for the following components:   Glucose-Capillary 153 (*)    All other components within normal limits  RESP PANEL BY RT-PCR (FLU A&B, COVID) ARPGX2  MRSA PCR SCREENING  PROTIME-INR  FERRITIN  URINALYSIS, ROUTINE W REFLEX MICROSCOPIC  CBC  CBC  URINALYSIS, ROUTINE W REFLEX MICROSCOPIC  TYPE AND SCREEN  PREPARE RBC (CROSSMATCH)    EKG EKG Interpretation  Date/Time:  Monday May 05 2020 10:20:23 EDT Ventricular Rate:  80 PR Interval:  184 QRS Duration: 86 QT Interval:  371 QTC Calculation: 428 R Axis:   -22 Text Interpretation: Sinus rhythm Atrial premature complex Borderline left axis deviation No significant change since last tracing Confirmed by Melene Plan [phone]) on 05/05/2020 10:29:33 AM   Radiology US RENAL  Result Date: 05/05/2020 CLINICAL DATA:  Acute renal insufficiency. Baseline stage III A chronic kidney disease. EXAM: RENAL / URINARY TRACT ULTRASOUND COMPLETE COMPARISON:  CT 02/01/2018 FINDINGS: Right Kidney: Renal measurements:  12.4 x 6.0 x 4.2 cm = volume: 163 mL. Echogenicity within normal limits. No mass or hydronephrosis visualized. Simple exophytic cortical cysts are seen within the upper and lower pole measuring up to 7.0 by 6.3 x 6.5 cm within the upper pole. This appears similar to prior examination. Mild perinephric fluid has developed, new since prior examination. Left Kidney: Renal measurements: 11.2 x 5.4 x 5.1 cm = volume: 160 mL. Echogenicity within normal limits. No mass or hydronephrosis visualized. Mild perinephric fluid has developed, new since prior examination. Bladder: Appears normal for degree of bladder distention. Other: None. IMPRESSION: Interval development of mild bilateral perinephric fluid, possibly inflammatory or related to underlying anasarca. Electronically Signed   By: Helyn Numbers MD   On: 05/05/2020 23:14    Procedures Procedures   Medications Ordered in ED Medications  pantoprazole (PROTONIX) injection 40 mg (40 mg Intravenous Given 05/06/20 1334)  acetaminophen (TYLENOL) tablet 325-650 mg ( Oral MAR Unhold 05/06/20 1312)  rosuvastatin (CRESTOR) tablet 5 mg (5 mg Oral Given 05/06/20 1336)  loratadine (CLARITIN) tablet 10 mg (10 mg Oral Given 05/06/20 1336)  latanoprost (XALATAN) 0.005 % ophthalmic solution 1 drop ( Both Eyes MAR Unhold 05/06/20 1312)  0.9 %  sodium chloride infusion ( Intravenous Restarted 05/06/20 1426)  hydrALAZINE (APRESOLINE) tablet 25 mg ( Oral MAR Unhold 05/06/20 1312)  insulin aspart (novoLOG) injection 0-9 Units (2 Units Subcutaneous Given 05/06/20 1334)  polyethylene glycol-electrolytes (NuLYTELY) solution 4,000 mL (has no administration in time range)  bisacodyl (DULCOLAX) suppository 10 mg (has no administration in time range)  predniSONE (DELTASONE) tablet 10 mg (  has no administration in time range)  sodium chloride 0.9 % bolus 1,000 mL (0 mLs Intravenous Stopped 05/05/20 1241)  pantoprazole (PROTONIX) injection 40 mg (40 mg Intravenous Given 05/05/20 1119)  0.9  %  sodium chloride infusion (Manually program via Guardrails IV Fluids) (0 mLs Intravenous Stopped 05/05/20 2030)  furosemide (LASIX) injection 20 mg (20 mg Intravenous Given 05/05/20 2031)    ED Course  I have reviewed the triage vital signs and the nursing notes.  Pertinent labs & imaging results that were available during my care of the patient were reviewed by me and considered in my medical decision making (see chart for details).    MDM Rules/Calculators/A&P                          87 yo M with a cc of dark and tarry stool.  Going on since last night.  Patient's clothing is thoroughly soiled with grossly melanotic stool.  Hemoglobin is resulted as 3 g lower than his baseline.  Platelets are normal.  I discussed case with Dr. Levora Angel, [facility].  Will come and evaluate the patient.  Recommending an INR.  Discussed with hospitalist for admission.   The patients results and plan were reviewed and discussed.   Any x-rays performed were independently reviewed by myself.   Differential diagnosis were considered with the presenting HPI.  Medications  pantoprazole (PROTONIX) injection 40 mg (40 mg Intravenous Given 05/06/20 1334)  acetaminophen (TYLENOL) tablet 325-650 mg ( Oral MAR Unhold 05/06/20 1312)  rosuvastatin (CRESTOR) tablet 5 mg (5 mg Oral Given 05/06/20 1336)  loratadine (CLARITIN) tablet 10 mg (10 mg Oral Given 05/06/20 1336)  latanoprost (XALATAN) 0.005 % ophthalmic solution 1 drop ( Both Eyes MAR Unhold 05/06/20 1312)  0.9 %  sodium chloride infusion ( Intravenous Restarted 05/06/20 1426)  hydrALAZINE (APRESOLINE) tablet 25 mg ( Oral MAR Unhold 05/06/20 1312)  insulin aspart (novoLOG) injection 0-9 Units (2 Units Subcutaneous Given 05/06/20 1334)  polyethylene glycol-electrolytes (NuLYTELY) solution 4,000 mL (has no administration in time range)  bisacodyl (DULCOLAX) suppository 10 mg (has no administration in time range)  predniSONE (DELTASONE) tablet 10 mg (has no  administration in time range)  sodium chloride 0.9 % bolus 1,000 mL (0 mLs Intravenous Stopped 05/05/20 1241)  pantoprazole (PROTONIX) injection 40 mg (40 mg Intravenous Given 05/05/20 1119)  0.9 %  sodium chloride infusion (Manually program via Guardrails IV Fluids) (0 mLs Intravenous Stopped 05/05/20 2030)  furosemide (LASIX) injection 20 mg (20 mg Intravenous Given 05/05/20 2031)    Vitals:   05/06/20 1250 05/06/20 1300 05/06/20 1305 05/06/20 1323  BP: (!) 141/58 (!) 126/56 (!) 126/46 (!) 145/68  Pulse: 72 70 70 73  Resp: (!) 21 (!) 22 17 18   Temp:    97.7 F (36.5 C)  TempSrc:    Oral  SpO2: 100% 100% 100% 98%  Weight:      Height:        Final diagnoses:  Upper GI bleed    Admission/ observation were discussed with the admitting physician, patient and/or family and they are comfortable with the plan.    Final Clinical Impression(s) / ED Diagnoses Final diagnoses:  Upper GI bleed    Rx / DC Orders ED Discharge Orders    None       , DO 05/06/20 1505

## 2020-05-05 NOTE — H&P (Signed)
History and Physical    Thomas Joseph JJH:[id number] DOB: 22-Feb-1933 DOA: 05/05/2020  PCP: Mila Palmer, MD (Confirm with patient/family/NH records and if not entered, this has to be entered at [facility] point of entry) Patient coming from: Home  I have personally briefly reviewed patient's old medical records in [facility]  Chief Complaint: Black stool  HPI: Thomas Joseph is a 87 y.o. male with medical history significant of HTN, HLD, IIDM, presented with new onset of melena.  Patient has been experiencing "bloating problems" during BM for several days, but yesterday evening he passed a moderate amount of dark-colored stool, feeling dizziness but no loss of consciousness.  This morning he had a large dark-colored bowel movement, and started to feel dizzy again well on toilet bowl, wife helped him to wipe saw dark red color stool as well.  Patient felt lightheaded and blurry vision, denied any chest pain or shortness of breath.  No abdominal pain.  Taking alcohol or NSAIDs on regular basis. ED Course: Hemoglobin 9.6 compared to 12.7 at baseline, no tachycardia, systolic BP 120s.  GI consulted, patient n.p.o. and PPI started in ED.  Review of Systems: As per HPI otherwise 14 point review of systems negative.    Past Medical History:  Diagnosis Date  . Anemia   . BPH (benign prostatic hyperplasia)   . Chronic kidney disease (CKD) stage G3a/A1, moderately decreased glomerular filtration rate (GFR) between 45-59 mL/min/1.73 square meter and albuminuria creatinine ratio less than 30 mg/g (HCC) 03/07/2020  . Diabetes mellitus without complication (HCC)   . ED (erectile dysfunction)   . Family history of adverse reaction to anesthesia    son had some problem years ago, pt unsure of what exact problem was  . Hypertension   . Obesity   . Pneumonia     Past Surgical History:  Procedure Laterality Date  .  LEFT KNEE ARTHROCOPY    . HERNIA REPAIR    . TRANSURETHRAL RESECTION OF  PROSTATE N/A 12/26/2014   Procedure: TRANSURETHRAL RESECTION OF THE PROSTATE;  Surgeon: Malen Gauze, MD;  Location: [facility];  Service: Urology;  Laterality: N/A;     reports that he quit smoking about 35 years ago. He has never used smokeless tobacco. He reports previous alcohol use. He reports that he does not use drugs.  Allergies  Allergen Reactions  . Hydrochlorothiazide Other (See Comments)    Reaction not recalled, believes it just made him urinate very frequently  . Lipitor [Atorvastatin] Other (See Comments)    Muscle pain  . Tizanidine Hcl Other (See Comments)  . Viagra [Sildenafil Citrate] Palpitations    Family History  Problem Relation Age of Onset  . CVA Father   . Hypertension Father   . Leukemia Sister   . Thyroid disease Sister   . Cancer Brother      Prior to Admission medications   Medication Sig Start Date End Date Taking? Authorizing Provider  acetaminophen (TYLENOL) 325 MG tablet Take 325-650 mg by mouth every 12 (twelve) hours as needed for mild pain or headache.    [provider]  amLODipine (NORVASC) 10 MG tablet Take 10 mg by mouth daily.    [provider]  Ascorbic Acid (VITAMIN C) 500 MG CAPS  02/08/19   [provider]  aspirin 81 MG chewable tablet Chew 81 mg by mouth every other day.     [provider]  Blood Pressure Monitor DEVI  10/23/19   [provider]  cetirizine (ZYRTEC) 10 MG tablet Take 10 mg by mouth daily as needed for allergies.    [provider]  hydrocortisone (ANUSOL-HC) 25 MG suppository Place 1 suppository (25 mg total) rectally 2 (two) times daily. 04/15/19   Cathie Hoops, Amy V, PA-C  latanoprost (XALATAN) 0.005 % ophthalmic solution Place 1 drop into both eyes at bedtime. 01/19/19   [provider]  lisinopril-hydrochlorothiazide (ZESTORETIC) 20-12.5 MG tablet Take 1 tablet by mouth daily. 02/15/19   [provider]  meloxicam (MOBIC) 15 MG tablet Take 15 mg by  mouth daily as needed. 03/28/19   [provider]  metFORMIN (GLUCOPHAGE-XR) 500 MG 24 hr tablet Take 1,000 mg by mouth daily.     [provider]  rosuvastatin (CRESTOR) 5 MG tablet Take by mouth. 08/26/19   [provider]  tadalafil (CIALIS) 20 MG tablet Take 20 mg by mouth as needed. 08/03/19   [provider]  zinc sulfate 220 (50 Zn) MG capsule  02/08/19   [provider]  benazepril (LOTENSIN) 40 MG tablet Take 40 mg by mouth daily.  02/02/19  [provider]  zolpidem (AMBIEN) 10 MG tablet Take 10 mg by mouth at bedtime as needed for sleep.   02/02/19  [provider]    Physical Exam: Vitals:   05/05/20 1300 05/05/20 1315 05/05/20 1330 05/05/20 1345  BP: (!) 128/59 125/63 121/62   Pulse: 66 (!) 38 65 66  Resp: 15 19 17 20   Temp:      TempSrc:      SpO2: 100% 99% 100% 100%  Weight:      Height:        Constitutional: NAD, calm, comfortable Vitals:   05/05/20 1300 05/05/20 1315 05/05/20 1330 05/05/20 1345  BP: (!) 128/59 125/63 121/62   Pulse: 66 (!) 38 65 66  Resp: 15 19 17 20   Temp:      TempSrc:      SpO2: 100% 99% 100% 100%  Weight:      Height:       Eyes: PERRL, lids and conjunctivae normal ENMT: Mucous membranes are moist. Posterior pharynx clear of any exudate or lesions.Normal dentition.  Neck: normal, supple, no masses, no thyromegaly Respiratory: clear to auscultation bilaterally, no wheezing, no crackles. Normal respiratory effort. No accessory muscle use.  Cardiovascular: Regular rate and rhythm, no murmurs / rubs / gallops. No extremity edema. 2+ pedal pulses. No carotid bruits.  Abdomen: no tenderness, no masses palpated. No hepatosplenomegaly. Bowel sounds positive.  Musculoskeletal: no clubbing / cyanosis. No joint deformity upper and lower extremities. Good ROM, no contractures. Normal muscle tone.  Skin: no rashes, lesions, ulcers. No induration Neurologic: CN 2-12 grossly intact. Sensation  intact, DTR normal. Strength 5/5 in all 4.  Psychiatric: Normal judgment and insight. Alert and oriented x 3. Normal mood.     Labs on Admission: I have personally reviewed following labs and imaging studies  CBC: Recent Labs  Lab 05/05/20 1026  WBC 8.7  NEUTROABS 6.8  HGB 9.6*  HCT 28.7*  MCV 94.1  PLT 185   Basic Metabolic Panel: Recent Labs  Lab 05/05/20 1026  NA 139  K 3.6  CL 106  CO2 22  GLUCOSE 173*  BUN 57*  CREATININE 1.81*  CALCIUM 7.9*   GFR: Estimated Creatinine Clearance: 38.1 mL/min (A) (by C-G formula based on SCr of 1.81 mg/dL (H)). Liver Function Tests: Recent Labs  Lab 05/05/20 1026  AST 17  ALT 12  ALKPHOS 50  BILITOT 0.9  PROT 5.4*  ALBUMIN 2.9*   No results for input(s): LIPASE, AMYLASE in the last 168 hours. No results for input(s): AMMONIA in the last 168 hours. Coagulation Profile: Recent Labs  Lab 05/05/20 1155  INR 1.1   Cardiac Enzymes: No results for input(s): CKTOTAL, CKMB, CKMBINDEX, TROPONINI in the last 168 hours. BNP (last 3 results) No results for input(s): PROBNP in the last 8760 hours. HbA1C: No results for input(s): HGBA1C in the last 72 hours. CBG: No results for input(s): GLUCAP in the last 168 hours. Lipid Profile: No results for input(s): CHOL, HDL, LDLCALC, TRIG, CHOLHDL, LDLDIRECT in the last 72 hours. Thyroid Function Tests: No results for input(s): TSH, T4TOTAL, FREET4, T3FREE, THYROIDAB in the last 72 hours. Anemia Panel: No results for input(s): VITAMINB12, FOLATE, FERRITIN, TIBC, IRON, RETICCTPCT in the last 72 hours. Urine analysis:    Component Value Date/Time   COLORURINE YELLOW 02/01/2018 1750   APPEARANCEUR CLEAR 02/01/2018 1750   LABSPEC 1.021 02/01/2018 1750   PHURINE 5.0 02/01/2018 1750   GLUCOSEU >=500 (A) 02/01/2018 1750   HGBUR NEGATIVE 02/01/2018 1750   BILIRUBINUR negative 04/15/2019 1459   KETONESUR trace (5) (A) 04/15/2019 1459   KETONESUR NEGATIVE 02/01/2018 1750   PROTEINUR  negative 04/15/2019 1459   PROTEINUR 30 (A) 02/01/2018 1750   UROBILINOGEN 1.0 04/15/2019 1459   UROBILINOGEN 0.2 04/18/2010 0910   NITRITE Negative 04/15/2019 1459   NITRITE NEGATIVE 02/01/2018 1750   LEUKOCYTESUR Negative 04/15/2019 1459    Radiological Exams on Admission: No results found.  EKG: Independently reviewed. Sinus, no acute ST-T changes.  Assessment/Plan Active Problems:   GI bleed  (please populate well all problems here in Problem List. (For example, if patient is on BP meds at home and you resume or decide to hold them, it is a problem that needs to be her. Same for CAD, COPD, HLD and so on)  Acute blood loss anemia -Secondary to upper GI bleed. -Given the persistent symptoms of near syncope, and significant drop of hemoglobin from baseline 12 to 9, decision to transfuse 1 unit PRBC.  Repeat H&H this evening. -N.p.o., IV fluid and PPI  Near syncope -Secondary from from acute GI bleed -Maintenance IV fluid, transfuse 1 packed RBC, check orthostatic vital signs tomorrow morning.  AKI vs SKD -Looks euvolumia -Check renal U/S and outpatient nephrology follow-up -Elevated BUN likely from a GI bleed less likely true uremia, will not administer DDAVP  HTN -Hold home BP meds  IIDM with hyperglycemia -Hold Metformin, start sliding scale   DVT prophylaxis: SCD Code Status: Full Code Family Communication: Wife at bedside Disposition Plan: Expect more than 2 midnight hospital stay Consults called: [facility] Admission status: PCU   Emeline General MD [facility] Pager [phone]  05/05/2020, 1:58 PM

## 2020-05-05 NOTE — H&P (View-Only) (Signed)
Referring Provider: DR. Melene Plan (ED) Primary Care Physician:  Mila Palmer, MD Primary Gastroenterologist:  Dr. Matthias Hughs [facility])  Reason for Consultation:  Melena and anemia  HPI: Thomas Joseph is a 87 y.o. male with history of CKD stage 3, DM type 2, and  HTN presenting for consultation of melena and anemia.  Patient states he started having melenic bowel movements last night.  He had 2 episodes of black stool  Mixed with a little bit of red blood last night, and one black stool this morning.  He also felt dizzy and states he had a syncopal episode when he tried to stand up from bed.  He notes mild shortness of breath but denies chest pain.  Denies abdominal pain.  Had some nausea but denies vomiting.  Reports history of constipation, for which he takes Senna.  Denies ASA, NSAID, or blood thinner use, though previously took 81 mg ASA.  Denies family history of colon cancer or gastrointestinal malignancy.  Last colonoscopy 10/2008: one 16mm tubular adenoma in transverse colon, one 24mm polyp in sigmoid colon, diverticulosis. No prior EGD.  Past Medical History:  Diagnosis Date  . Anemia   . BPH (benign prostatic hyperplasia)   . Chronic kidney disease (CKD) stage G3a/A1, moderately decreased glomerular filtration rate (GFR) between 45-59 mL/min/1.73 square meter and albuminuria creatinine ratio less than 30 mg/g (HCC) 03/07/2020  . Diabetes mellitus without complication (HCC)   . ED (erectile dysfunction)   . Family history of adverse reaction to anesthesia    son had some problem years ago, pt unsure of what exact problem was  . Hypertension   . Obesity   . Pneumonia     Past Surgical History:  Procedure Laterality Date  .  LEFT KNEE ARTHROCOPY    . HERNIA REPAIR    . TRANSURETHRAL RESECTION OF PROSTATE N/A 12/26/2014   Procedure: TRANSURETHRAL RESECTION OF THE PROSTATE;  Surgeon: Malen Gauze, MD;  Location: [facility];  Service: Urology;  Laterality: N/A;    Prior  to Admission medications   Medication Sig Start Date End Date Taking? Authorizing Provider  acetaminophen (TYLENOL) 325 MG tablet Take 325-650 mg by mouth every 12 (twelve) hours as needed for mild pain or headache.    [provider]  amLODipine (NORVASC) 10 MG tablet Take 10 mg by mouth daily.    [provider]  Ascorbic Acid (VITAMIN C) 500 MG CAPS  02/08/19   [provider]  aspirin 81 MG chewable tablet Chew 81 mg by mouth every other day.     [provider]  Blood Pressure Monitor DEVI  10/23/19   [provider]  cetirizine (ZYRTEC) 10 MG tablet Take 10 mg by mouth daily as needed for allergies.    [provider]  hydrocortisone (ANUSOL-HC) 25 MG suppository Place 1 suppository (25 mg total) rectally 2 (two) times daily. 04/15/19   Cathie Hoops, Amy V, PA-C  latanoprost (XALATAN) 0.005 % ophthalmic solution Place 1 drop into both eyes at bedtime. 01/19/19   [provider]  lisinopril-hydrochlorothiazide (ZESTORETIC) 20-12.5 MG tablet Take 1 tablet by mouth daily. 02/15/19   [provider]  meloxicam (MOBIC) 15 MG tablet Take 15 mg by mouth daily as needed. 03/28/19   [provider]  metFORMIN (GLUCOPHAGE-XR) 500 MG 24 hr tablet Take 1,000 mg by mouth daily.     [provider]  rosuvastatin (CRESTOR) 5 MG tablet Take by mouth. 08/26/19   [provider]  tadalafil (  CIALIS) 20 MG tablet Take 20 mg by mouth as needed. 08/03/19   [provider]  zinc sulfate 220 (50 Zn) MG capsule  02/08/19   [provider]  benazepril (LOTENSIN) 40 MG tablet Take 40 mg by mouth daily.  02/02/19  [provider]  zolpidem (AMBIEN) 10 MG tablet Take 10 mg by mouth at bedtime as needed for sleep.   02/02/19  [provider]    Scheduled Meds: Continuous Infusions: PRN Meds:.  Allergies as of 05/05/2020 - Review Complete 05/05/2020  Allergen Reaction Noted  . Hydrochlorothiazide Other  (See Comments) 10/24/2013  . Lipitor [atorvastatin] Other (See Comments) 10/24/2013  . Tizanidine hcl Other (See Comments) 10/23/2019  . Viagra [sildenafil citrate] Palpitations 10/24/2013    Family History  Problem Relation Age of Onset  . CVA Father   . Hypertension Father   . Leukemia Sister   . Thyroid disease Sister   . Cancer Brother     Social History   Socioeconomic History  . Marital status: Married    Spouse name: Not on file  . Number of children: Not on file  . Years of education: Not on file  . Highest education level: Not on file  Occupational History  . Not on file  Tobacco Use  . Smoking status: Former Smoker    Quit date: 11/28/1984    Years since quitting: 35.4  . Smokeless tobacco: Never Used  Substance and Sexual Activity  . Alcohol use: Not Currently    Comment: occasional glass of wine  . Drug use: No  . Sexual activity: Not on file  Other Topics Concern  . Not on file  Social History Narrative  . Not on file   Social Determinants of Health   Financial Resource Strain: Not on file  Food Insecurity: Not on file  Transportation Needs: Not on file  Physical Activity: Not on file  Stress: Not on file  Social Connections: Not on file  Intimate Partner Violence: Not on file    Review of Systems: Review of Systems  Constitutional: Positive for malaise/fatigue. Negative for fever.  HENT: Negative for hearing loss and tinnitus.   Eyes: Negative for pain and redness.  Respiratory: Positive for shortness of breath. Negative for cough.   Cardiovascular: Negative for chest pain and palpitations.  Gastrointestinal: Positive for diarrhea, melena and nausea. Negative for abdominal pain, blood in stool, constipation, heartburn and vomiting.  Genitourinary: Negative for flank pain and hematuria.  Musculoskeletal: Negative for back pain and joint pain.  Skin: Negative for itching and rash.  Neurological: Positive for loss of consciousness and weakness.   Endo/Heme/Allergies: Negative for polydipsia. Does not bruise/bleed easily.  Psychiatric/Behavioral: Negative for substance abuse. The patient is not nervous/anxious.     Physical Exam: Vital signs: Vitals:   05/05/20 1115 05/05/20 1130  BP: (!) 139/110   Pulse: 67 69  Resp: 12 (!) 22  Temp:    SpO2: 100% 100%      Physical Exam Vitals reviewed.  Constitutional:      General: He is not in acute distress. HENT:     Head: Normocephalic and atraumatic.     Nose: Nose normal. No congestion.     Mouth/Throat:     Mouth: Mucous membranes are moist.     Pharynx: Oropharynx is clear.  Eyes:     General: No scleral icterus.    Extraocular Movements: Extraocular movements intact.     Conjunctiva/sclera: Conjunctivae normal.  Cardiovascular:  Rate and Rhythm: Normal rate and regular rhythm.     Pulses: Normal pulses.  Pulmonary:     Effort: Pulmonary effort is normal. No respiratory distress.  Abdominal:     General: Bowel sounds are normal. There is no distension.     Palpations: Abdomen is soft. There is no mass.     Tenderness: There is no abdominal tenderness. There is no guarding or rebound.     Hernia: No hernia is present.  Musculoskeletal:        General: No swelling or tenderness.     Cervical back: Normal range of motion and neck supple.  Skin:    General: Skin is warm and dry.  Neurological:     General: No focal deficit present.     Mental Status: He is oriented to person, place, and time. He is lethargic.  Psychiatric:        Mood and Affect: Mood normal.        Behavior: Behavior normal. Behavior is cooperative.    GI:  Lab Results: Recent Labs    05/05/20 1026  WBC 8.7  HGB 9.6*  HCT 28.7*  PLT 185   BMET Recent Labs    05/05/20 1026  NA 139  K 3.6  CL 106  CO2 22  GLUCOSE 173*  BUN 57*  CREATININE 1.81*  CALCIUM 7.9*   LFT Recent Labs    05/05/20 1026  PROT 5.4*  ALBUMIN 2.9*  AST 17  ALT 12  ALKPHOS 50  BILITOT 0.9    PT/INR No results for input(s): LABPROT, INR in the last 72 hours.   Studies/Results: No results found.  Impression: Melena, anemia: suspected upper GI bleeding, though right-sided colonic bleeding also a possibility.   -Hgb 9.6, decreased from baseline 12.7 as of 03/2019 -BUN elevated to 57, but Cr also elevated to 1.81 -Normal PT/INR 14.6/ 1.1   Plan: EGD tomorrow.  I thoroughly discussed the procedure with the patient and patient's wife at bedside to include nature, alternatives, benefits, and risks (including but not limited to bleeding, infection, perforation, anesthesia/cardiac and pulmonary complications).  Patient verbalized understanding and gave verbal consent to proceed with EGD.  Start Protonix IV twice daily.  Clear liquid diet. NPO after midnight.  [facility] will follow.   LOS: 0 days   Edrick Kins  PA-C 05/05/2020, 12:16 PM  Contact #  [phone]

## 2020-05-05 NOTE — ED Notes (Signed)
Pt's diaper changed.

## 2020-05-05 NOTE — Consult Note (Signed)
Referring Provider: DR. Dan Floyd (ED) Primary Care Physician:  Wolters, Sharon, MD Primary Gastroenterologist:  Dr. Buccini ([facility])  Reason for Consultation:  Melena and anemia  HPI: Thomas Joseph is a 87 y.o. male with history of CKD stage 3, DM type 2, and  HTN presenting for consultation of melena and anemia.  Patient states he started having melenic bowel movements last night.  He had 2 episodes of black stool  Mixed with a little bit of red blood last night, and one black stool this morning.  He also felt dizzy and states he had a syncopal episode when he tried to stand up from bed.  He notes mild shortness of breath but denies chest pain.  Denies abdominal pain.  Had some nausea but denies vomiting.  Reports history of constipation, for which he takes Senna.  Denies ASA, NSAID, or blood thinner use, though previously took 81 mg ASA.  Denies family history of colon cancer or gastrointestinal malignancy.  Last colonoscopy 10/2008: one 3mm tubular adenoma in transverse colon, one 5mm polyp in sigmoid colon, diverticulosis. No prior EGD.  Past Medical History:  Diagnosis Date  . Anemia   . BPH (benign prostatic hyperplasia)   . Chronic kidney disease (CKD) stage G3a/A1, moderately decreased glomerular filtration rate (GFR) between 45-59 mL/min/1.73 square meter and albuminuria creatinine ratio less than 30 mg/g (HCC) 03/07/2020  . Diabetes mellitus without complication (HCC)   . ED (erectile dysfunction)   . Family history of adverse reaction to anesthesia    son had some problem years ago, pt unsure of what exact problem was  . Hypertension   . Obesity   . Pneumonia     Past Surgical History:  Procedure Laterality Date  .  LEFT KNEE ARTHROCOPY    . HERNIA REPAIR    . TRANSURETHRAL RESECTION OF PROSTATE N/A 12/26/2014   Procedure: TRANSURETHRAL RESECTION OF THE PROSTATE;  Surgeon: Patrick L McKenzie, MD;  Location: [facility];  Service: Urology;  Laterality: N/A;    Prior  to Admission medications   Medication Sig Start Date End Date Taking? Authorizing Provider  acetaminophen (TYLENOL) 325 MG tablet Take 325-650 mg by mouth every 12 (twelve) hours as needed for mild pain or headache.    [provider]  amLODipine (NORVASC) 10 MG tablet Take 10 mg by mouth daily.    [provider]  Ascorbic Acid (VITAMIN C) 500 MG CAPS  02/08/19   [provider]  aspirin 81 MG chewable tablet Chew 81 mg by mouth every other day.     [provider]  Blood Pressure Monitor DEVI  10/23/19   [provider]  cetirizine (ZYRTEC) 10 MG tablet Take 10 mg by mouth daily as needed for allergies.    [provider]  hydrocortisone (ANUSOL-HC) 25 MG suppository Place 1 suppository (25 mg total) rectally 2 (two) times daily. 04/15/19   Yu, Amy V, PA-C  latanoprost (XALATAN) 0.005 % ophthalmic solution Place 1 drop into both eyes at bedtime. 01/19/19   [provider]  lisinopril-hydrochlorothiazide (ZESTORETIC) 20-12.5 MG tablet Take 1 tablet by mouth daily. 02/15/19   [provider]  meloxicam (MOBIC) 15 MG tablet Take 15 mg by mouth daily as needed. 03/28/19   [provider]  metFORMIN (GLUCOPHAGE-XR) 500 MG 24 hr tablet Take 1,000 mg by mouth daily.     [provider]  rosuvastatin (CRESTOR) 5 MG tablet Take by mouth. 08/26/19   [provider]  tadalafil (  CIALIS) 20 MG tablet Take 20 mg by mouth as needed. 08/03/19   [provider]  zinc sulfate 220 (50 Zn) MG capsule  02/08/19   [provider]  benazepril (LOTENSIN) 40 MG tablet Take 40 mg by mouth daily.  02/02/19  [provider]  zolpidem (AMBIEN) 10 MG tablet Take 10 mg by mouth at bedtime as needed for sleep.   02/02/19  [provider]    Scheduled Meds: Continuous Infusions: PRN Meds:.  Allergies as of 05/05/2020 - Review Complete 05/05/2020  Allergen Reaction Noted  . Hydrochlorothiazide Other  (See Comments) 10/24/2013  . Lipitor [atorvastatin] Other (See Comments) 10/24/2013  . Tizanidine hcl Other (See Comments) 10/23/2019  . Viagra [sildenafil citrate] Palpitations 10/24/2013    Family History  Problem Relation Age of Onset  . CVA Father   . Hypertension Father   . Leukemia Sister   . Thyroid disease Sister   . Cancer Brother     Social History   Socioeconomic History  . Marital status: Married    Spouse name: Not on file  . Number of children: Not on file  . Years of education: Not on file  . Highest education level: Not on file  Occupational History  . Not on file  Tobacco Use  . Smoking status: Former Smoker    Quit date: 11/28/1984    Years since quitting: 35.4  . Smokeless tobacco: Never Used  Substance and Sexual Activity  . Alcohol use: Not Currently    Comment: occasional glass of wine  . Drug use: No  . Sexual activity: Not on file  Other Topics Concern  . Not on file  Social History Narrative  . Not on file   Social Determinants of Health   Financial Resource Strain: Not on file  Food Insecurity: Not on file  Transportation Needs: Not on file  Physical Activity: Not on file  Stress: Not on file  Social Connections: Not on file  Intimate Partner Violence: Not on file    Review of Systems: Review of Systems  Constitutional: Positive for malaise/fatigue. Negative for fever.  HENT: Negative for hearing loss and tinnitus.   Eyes: Negative for pain and redness.  Respiratory: Positive for shortness of breath. Negative for cough.   Cardiovascular: Negative for chest pain and palpitations.  Gastrointestinal: Positive for diarrhea, melena and nausea. Negative for abdominal pain, blood in stool, constipation, heartburn and vomiting.  Genitourinary: Negative for flank pain and hematuria.  Musculoskeletal: Negative for back pain and joint pain.  Skin: Negative for itching and rash.  Neurological: Positive for loss of consciousness and weakness.   Endo/Heme/Allergies: Negative for polydipsia. Does not bruise/bleed easily.  Psychiatric/Behavioral: Negative for substance abuse. The patient is not nervous/anxious.     Physical Exam: Vital signs: Vitals:   05/05/20 1115 05/05/20 1130  BP: (!) 139/110   Pulse: 67 69  Resp: 12 (!) 22  Temp:    SpO2: 100% 100%      Physical Exam Vitals reviewed.  Constitutional:      General: He is not in acute distress. HENT:     Head: Normocephalic and atraumatic.     Nose: Nose normal. No congestion.     Mouth/Throat:     Mouth: Mucous membranes are moist.     Pharynx: Oropharynx is clear.  Eyes:     General: No scleral icterus.    Extraocular Movements: Extraocular movements intact.     Conjunctiva/sclera: Conjunctivae normal.  Cardiovascular:  Rate and Rhythm: Normal rate and regular rhythm.     Pulses: Normal pulses.  Pulmonary:     Effort: Pulmonary effort is normal. No respiratory distress.  Abdominal:     General: Bowel sounds are normal. There is no distension.     Palpations: Abdomen is soft. There is no mass.     Tenderness: There is no abdominal tenderness. There is no guarding or rebound.     Hernia: No hernia is present.  Musculoskeletal:        General: No swelling or tenderness.     Cervical back: Normal range of motion and neck supple.  Skin:    General: Skin is warm and dry.  Neurological:     General: No focal deficit present.     Mental Status: He is oriented to person, place, and time. He is lethargic.  Psychiatric:        Mood and Affect: Mood normal.        Behavior: Behavior normal. Behavior is cooperative.    GI:  Lab Results: Recent Labs    05/05/20 1026  WBC 8.7  HGB 9.6*  HCT 28.7*  PLT 185   BMET Recent Labs    05/05/20 1026  NA 139  K 3.6  CL 106  CO2 22  GLUCOSE 173*  BUN 57*  CREATININE 1.81*  CALCIUM 7.9*   LFT Recent Labs    05/05/20 1026  PROT 5.4*  ALBUMIN 2.9*  AST 17  ALT 12  ALKPHOS 50  BILITOT 0.9    PT/INR No results for input(s): LABPROT, INR in the last 72 hours.   Studies/Results: No results found.  Impression: Melena, anemia: suspected upper GI bleeding, though right-sided colonic bleeding also a possibility.   -Hgb 9.6, decreased from baseline 12.7 as of 03/2019 -BUN elevated to 57, but Cr also elevated to 1.81 -Normal PT/INR 14.6/ 1.1   Plan: EGD tomorrow.  I thoroughly discussed the procedure with the patient and patient's wife at bedside to include nature, alternatives, benefits, and risks (including but not limited to bleeding, infection, perforation, anesthesia/cardiac and pulmonary complications).  Patient verbalized understanding and gave verbal consent to proceed with EGD.  Start Protonix IV twice daily.  Clear liquid diet. NPO after midnight.  [facility] will follow.   LOS: 0 days   Edrick Kins  PA-C 05/05/2020, 12:16 PM  Contact #  [phone]

## 2020-05-06 ENCOUNTER — Inpatient Hospital Stay (HOSPITAL_COMMUNITY): Payer: Medicare Other | Admitting: Certified Registered"

## 2020-05-06 ENCOUNTER — Encounter (HOSPITAL_COMMUNITY)
Admission: EM | Disposition: A | Discharge status: Home / Self Care | Payer: Self-pay | Source: Home / Self Care | Attending: Internal Medicine

## 2020-05-06 ENCOUNTER — Encounter (HOSPITAL_COMMUNITY): Payer: Self-pay | Admitting: Internal Medicine

## 2020-05-06 DIAGNOSIS — K921 Melena: Secondary | ICD-10-CM | POA: Diagnosis not present

## 2020-05-06 DIAGNOSIS — N179 Acute kidney failure, unspecified: Secondary | ICD-10-CM | POA: Diagnosis not present

## 2020-05-06 HISTORY — PX: ESOPHAGOGASTRODUODENOSCOPY: SHX5428

## 2020-05-06 LAB — CBC
HCT: 24.5 % — ABNORMAL LOW (ref 39.0–52.0)
HCT: 25.6 % — ABNORMAL LOW (ref 39.0–52.0)
HCT: 21.6 % — ABNORMAL LOW (ref 39.0–52.0)
Hemoglobin: 8.2 g/dL — ABNORMAL LOW (ref 13.0–17.0)
Hemoglobin: 8.6 g/dL — ABNORMAL LOW (ref 13.0–17.0)
Hemoglobin: 7.4 g/dL — ABNORMAL LOW (ref 13.0–17.0)
MCH: 30.9 pg (ref 26.0–34.0)
MCH: 31.4 pg (ref 26.0–34.0)
MCH: 31.5 pg (ref 26.0–34.0)
MCHC: 33.5 g/dL (ref 30.0–36.0)
MCHC: 33.6 g/dL (ref 30.0–36.0)
MCHC: 34.3 g/dL (ref 30.0–36.0)
MCV: 92.5 fL (ref 80.0–100.0)
MCV: 93.4 fL (ref 80.0–100.0)
MCV: 91.9 fL (ref 80.0–100.0)
Platelets: 169 10*3/uL (ref 150–400)
Platelets: 165 10*3/uL (ref 150–400)
Platelets: 157 10*3/uL (ref 150–400)
RBC: 2.65 MIL/uL — ABNORMAL LOW (ref 4.22–5.81)
RBC: 2.74 MIL/uL — ABNORMAL LOW (ref 4.22–5.81)
RBC: 2.35 MIL/uL — ABNORMAL LOW (ref 4.22–5.81)
RDW: 14.2 % (ref 11.5–15.5)
RDW: 14.5 % (ref 11.5–15.5)
RDW: 14.4 % (ref 11.5–15.5)
WBC: 13.7 10*3/uL — ABNORMAL HIGH (ref 4.0–10.5)
WBC: 10.8 10*3/uL — ABNORMAL HIGH (ref 4.0–10.5)
WBC: 9.7 10*3/uL (ref 4.0–10.5)
nRBC: 0 % (ref 0.0–0.2)
nRBC: 0 % (ref 0.0–0.2)
nRBC: 0 % (ref 0.0–0.2)

## 2020-05-06 LAB — GLUCOSE, CAPILLARY
Glucose-Capillary: 136 mg/dL — ABNORMAL HIGH (ref 70–99)
Glucose-Capillary: 153 mg/dL — ABNORMAL HIGH (ref 70–99)
Glucose-Capillary: 157 mg/dL — ABNORMAL HIGH (ref 70–99)
Glucose-Capillary: 133 mg/dL — ABNORMAL HIGH (ref 70–99)

## 2020-05-06 LAB — HEMOGLOBIN AND HEMATOCRIT, BLOOD
HCT: 24.2 % — ABNORMAL LOW (ref 39.0–52.0)
Hemoglobin: 8.3 g/dL — ABNORMAL LOW (ref 13.0–17.0)

## 2020-05-06 LAB — BASIC METABOLIC PANEL
Anion gap: 12 (ref 5–15)
BUN: 62 mg/dL — ABNORMAL HIGH (ref 8–23)
CO2: 18 mmol/L — ABNORMAL LOW (ref 22–32)
Calcium: 7.5 mg/dL — ABNORMAL LOW (ref 8.9–10.3)
Chloride: 111 mmol/L (ref 98–111)
Creatinine, Ser: 1.72 mg/dL — ABNORMAL HIGH (ref 0.61–1.24)
GFR, Estimated: 39 mL/min — ABNORMAL LOW (ref >60)
Glucose, Bld: 193 mg/dL — ABNORMAL HIGH (ref 70–99)
Potassium: 3.7 mmol/L (ref 3.5–5.1)
Sodium: 141 mmol/L (ref 135–145)

## 2020-05-06 SURGERY — EGD (ESOPHAGOGASTRODUODENOSCOPY)
Anesthesia: Monitor Anesthesia Care

## 2020-05-06 MED ORDER — PEG 3350-KCL-NA BICARB-NACL 420 G PO SOLR
4000.0000 mL | Freq: Once | ORAL | Status: AC
  Administered 2020-05-06: 4000 mL via ORAL
  Filled 2020-05-06: qty 4000

## 2020-05-06 MED ORDER — LIDOCAINE 2% (20 MG/ML) 5 ML SYRINGE
INTRAMUSCULAR | Status: DC | PRN
  Administered 2020-05-06: 40 mg via INTRAVENOUS

## 2020-05-06 MED ORDER — SODIUM CHLORIDE 0.9 % IV SOLN: INTRAVENOUS | Status: DC

## 2020-05-06 MED ORDER — PHENYLEPHRINE HCL-NACL 10-0.9 MG/250ML-% IV SOLN
INTRAVENOUS | Status: DC | PRN
  Administered 2020-05-06: 55 ug/min via INTRAVENOUS

## 2020-05-06 MED ORDER — BISACODYL 10 MG RE SUPP
10.0000 mg | Freq: Once | RECTAL | Status: AC
  Administered 2020-05-06: 10 mg via RECTAL
  Filled 2020-05-06: qty 1

## 2020-05-06 MED ORDER — PHENYLEPHRINE 40 MCG/ML (10ML) SYRINGE FOR IV PUSH (FOR BLOOD PRESSURE SUPPORT)
PREFILLED_SYRINGE | INTRAVENOUS | Status: DC | PRN
  Administered 2020-05-06 (×2): 80 ug via INTRAVENOUS
  Administered 2020-05-06 (×2): 120 ug via INTRAVENOUS

## 2020-05-06 MED ORDER — PREDNISONE 10 MG PO TABS
10.0000 mg | ORAL_TABLET | Freq: Every day | ORAL | Status: DC
  Administered 2020-05-07 – 2020-05-09 (×3): 10 mg via ORAL
  Filled 2020-05-06 (×3): qty 1

## 2020-05-06 MED ORDER — PROPOFOL 500 MG/50ML IV EMUL
INTRAVENOUS | Status: DC | PRN
  Administered 2020-05-06: 200 ug/kg/min via INTRAVENOUS

## 2020-05-06 MED ORDER — LACTATED RINGERS IV SOLN: INTRAVENOUS | Status: DC | PRN

## 2020-05-06 NOTE — Op Note (Signed)
[facility] Patient Name: Thomas Joseph Procedure Date : 05/06/2020 MRN: [id number] Attending MD: Willis Modena , MD Date of Birth: 12-25-1933 CSN: [id number] Age: 87 Admit Type: Inpatient Procedure:                Upper GI endoscopy Indications:              Acute post hemorrhagic anemia, Melena Providers:                Willis Modena, MD, Roselie Awkward, RN, Rozetta Nunnery, Technician Referring MD:             [facility] Medicines:                Monitored Anesthesia Care Complications:            No immediate complications. Estimated Blood Loss:     Estimated blood loss: none. Procedure:                Pre-Anesthesia Assessment:                           - Prior to the procedure, a History and Physical                            was performed, and patient medications and                            allergies were reviewed. The patient's tolerance of                            previous anesthesia was also reviewed. The risks                            and benefits of the procedure and the sedation                            options and risks were discussed with the patient.                            All questions were answered, and informed consent                            was obtained. Prior Anticoagulants: The patient has                            taken no previous anticoagulant or antiplatelet                            agents. ASA Grade Assessment: III - A patient with                            severe systemic disease. After reviewing the risks  and benefits, the patient was deemed in                            satisfactory condition to undergo the procedure.                           After obtaining informed consent, the endoscope was                            passed under direct vision. Throughout the                            procedure, the patient's blood pressure, pulse, and                             oxygen saturations were monitored continuously. The                            [id number]-[id number] ([id number]) Olympus gastroscope was                            introduced through the mouth, and advanced to the                            third part of duodenum. The upper GI endoscopy was                            accomplished without difficulty. The patient                            tolerated the procedure well. Scope In: Scope Out: Findings:      A medium-sized hiatal hernia was present. Widely patent Schatzki's ring.      The exam of the esophagus was otherwise normal.      The entire examined stomach was normal.      The duodenal bulb, first portion of the duodenum, second portion of the       duodenum and third portion of the duodenum were normal.      No old or fresh blood was seen to the extent of our examination. Impression:               - Medium-sized hiatal hernia.                           - Widely patent Schatzki's ring.                           - Normal stomach.                           - Normal duodenal bulb, first portion of the                            duodenum, second portion of the duodenum and third  portion of the duodenum.                           - No source of bleeding identified on today's EGD. Recommendation:           - Return patient to hospital ward for ongoing care.                           - Clear liquid diet today.                           - Continue present medications.                           - Perform a colonoscopy tomorrow.. Procedure Code(s):        --- Professional ---                           [phone], Esophagogastroduodenoscopy, flexible,                            transoral; diagnostic, including collection of                            specimen(s) by brushing or washing, when performed                            (separate procedure) Diagnosis Code(s):        --- Professional ---                           K44.9,  Diaphragmatic hernia without obstruction or                            gangrene                           D62, Acute posthemorrhagic anemia                           K92.1, Melena (includes Hematochezia) CPT copyright 2019 [employer]. All rights reserved. The codes documented in this report are preliminary and upon coder review may  be revised to meet current compliance requirements. Willis Modena, MD 05/06/2020 12:15:10 PM This report has been signed electronically. Number of Addenda: 0

## 2020-05-06 NOTE — Transfer of Care (Signed)
Immediate Anesthesia Transfer of Care Note  Patient: JOAHAN SWATZELL  Procedure(s) Performed: ESOPHAGOGASTRODUODENOSCOPY (EGD) (N/A )  Patient Location: PACU  Anesthesia Type:MAC  Level of Consciousness: awake and alert   Airway & Oxygen Therapy: Patient Spontanous Breathing and Patient connected to nasal cannula oxygen  Post-op Assessment: Report given to RN and Post -op Vital signs reviewed and stable  Post vital signs: Reviewed and unstable  Last Vitals:  Vitals Value Taken Time  BP 141/53 05/06/20 1217  Temp    Pulse 67 05/06/20 1220  Resp 22 05/06/20 1220  SpO2 100 % 05/06/20 1220  Vitals shown include unvalidated device data.  Last Pain:  Vitals:   05/06/20 1204  TempSrc:   PainSc: Asleep         Complications: No complications documented.

## 2020-05-06 NOTE — Progress Notes (Signed)
0200 :  Patient had two large bloody BM. Hgb dropped from 9.3 to 8.2. MD made aware. No new orders received. Will continue to monitor the patient and update the MD.

## 2020-05-06 NOTE — Progress Notes (Signed)
Phenylephrine turned off per anesthesia

## 2020-05-06 NOTE — Plan of Care (Signed)

## 2020-05-06 NOTE — Interval H&P Note (Signed)
History and Physical Interval Note:  05/06/2020 11:35 AM  Thomas Joseph  has presented today for surgery, with the diagnosis of melena, anemia.  The various methods of treatment have been discussed with the patient and family. After consideration of risks, benefits and other options for treatment, the patient has consented to  Procedure(s): ESOPHAGOGASTRODUODENOSCOPY (EGD) (N/A) as a surgical intervention.  The patient's history has been reviewed, patient examined, no change in status, stable for surgery.  I have reviewed the patient's chart and labs.  Questions were answered to the patient's satisfaction.     Freddy Jaksch

## 2020-05-06 NOTE — Progress Notes (Signed)
Phenylephrine decreased to 55mcg/min. Per anesthesia

## 2020-05-06 NOTE — Progress Notes (Signed)
Progress Note    Thomas Joseph  IOX:[id number] DOB: 04/24/1933  DOA: 05/05/2020 PCP: Mila Palmer, MD    Brief Narrative:     Medical records reviewed and are as summarized below:  Thomas Joseph is an 87 y.o. male with medical history significant of HTN, HLD, IIDM, presented with new onset of melena. Patient has been experiencing "bloating problems" during BM for several days, but yesterday evening he passed a moderate amount of dark-colored stool.  Work up included EGD on 4/26 and plan for colonoscopy on 4/27.  Assessment/Plan:   Active Problems:   GI bleed   Acute blood loss anemia -Secondary GI bleed. -Given the persistent symptoms of near syncope, and significant drop of hemoglobin from baseline  - transfuse 1 unit PRBC. -4/26: EGD: no source of bleeding -4/27: plan for colonoscopy  Near syncope -Secondary from from acute GI bleed  AKI  -gentle IVF Recheck in AM -check U/A U/S renal shows: Interval development of mild bilateral perinephric fluid, possibly inflammatory or related to underlying anasarca  HTN -Hold home BP meds  IIDM with hyperglycemia -Hold Metformin -SSI  Family Communication/Anticipated D/C date and plan/Code Status   DVT prophylaxis: scd Code Status: Full Code.  Family Communication: wife at bedside Disposition Plan: Status is: Inpatient  Remains inpatient appropriate because:Inpatient level of care appropriate due to severity of illness   Dispo: The patient is from: Home              Anticipated d/c is to: Home              Patient currently is not medically stable to d/c.   Difficult to place patient No         Medical Consultants:    GI    Subjective:   tired  Objective:    Vitals:   05/06/20 0600 05/06/20 0715 05/06/20 1032 05/06/20 1204  BP: (!) 109/54 131/72 (!) 175/69 (!) 66/38  Pulse:  79 87 74  Resp:  14 (!) 21 (!) 25  Temp:  98 F (36.7 C) 98 F (36.7 C)   TempSrc:  Oral Oral    SpO2:  100% 100% 100%  Weight:      Height:        Intake/Output Summary (Last 24 hours) at 05/06/2020 1216 Last data filed at 05/06/2020 1153 Gross per 24 hour  Intake 2641.03 ml  Output 1001 ml  Net 1640.03 ml   Filed Weights   05/05/20 1022  Weight: 102 kg    Exam:  General: Appearance:     Overweight male in no acute distress     Lungs:     respirations unlabored  Heart:    Normal heart rate. Normal rhythm. No murmurs, rubs, or gallops.   MS:   All extremities are intact.   Neurologic:   Sleepy, No apparent focal neurological defect.     Data Reviewed:   I have personally reviewed following labs and imaging studies:  Labs: Labs show the following:   Basic Metabolic Panel: Recent Labs  Lab 05/05/20 1026 05/06/20 0104  NA 139 141  K 3.6 3.7  CL 106 111  CO2 22 18*  GLUCOSE 173* 193*  BUN 57* 62*  CREATININE 1.81* 1.72*  CALCIUM 7.9* 7.5*   GFR Estimated Creatinine Clearance: 40.1 mL/min (A) (by C-G formula based on SCr of 1.72 mg/dL (H)). Liver Function Tests: Recent Labs  Lab 05/05/20 1026  AST 17  ALT 12  ALKPHOS 50  BILITOT 0.9  PROT 5.4*  ALBUMIN 2.9*   No results for input(s): LIPASE, AMYLASE in the last 168 hours. No results for input(s): AMMONIA in the last 168 hours. Coagulation profile Recent Labs  Lab 05/05/20 1155  INR 1.1    CBC: Recent Labs  Lab 05/05/20 1026 05/05/20 2247 05/06/20 0104 05/06/20 0752  WBC 8.7  --  13.7*  --   NEUTROABS 6.8  --   --   --   HGB 9.6* 9.3* 8.2* 8.3*  HCT 28.7* 27.0* 24.5* 24.2*  MCV 94.1  --  92.5  --   PLT 185  --  169  --    Cardiac Enzymes: No results for input(s): CKTOTAL, CKMB, CKMBINDEX, TROPONINI in the last 168 hours. BNP (last 3 results) No results for input(s): PROBNP in the last 8760 hours. CBG: Recent Labs  Lab 05/05/20 1635 05/05/20 2115 05/06/20 0616  GLUCAP 147* 148* 136*   D-Dimer: No results for input(s): DDIMER in the last 72 hours. Hgb A1c: Recent Labs     05/05/20 1539  HGBA1C 7.2*   Lipid Profile: No results for input(s): CHOL, HDL, LDLCALC, TRIG, CHOLHDL, LDLDIRECT in the last 72 hours. Thyroid function studies: No results for input(s): TSH, T4TOTAL, T3FREE, THYROIDAB in the last 72 hours.  Invalid input(s): FREET3 Anemia work up: Recent Labs    05/05/20 1539  FERRITIN 91  TIBC 204*  IRON 74  RETICCTPCT 1.2   Sepsis Labs: Recent Labs  Lab 05/05/20 1026 05/06/20 0104  WBC 8.7 13.7*    Microbiology Recent Results (from the past 240 hour(s))  Resp Panel by RT-PCR (Flu A&B, Covid) Nasopharyngeal Swab     Status: None   Collection Time: 05/05/20 10:29 AM   Specimen: Nasopharyngeal Swab; Nasopharyngeal(NP) swabs in vial transport medium  Result Value Ref Range Status   SARS Coronavirus 2 by RT PCR NEGATIVE NEGATIVE Final    Comment: (NOTE) SARS-CoV-2 target nucleic acids are NOT DETECTED.  The SARS-CoV-2 RNA is generally detectable in upper respiratory specimens during the acute phase of infection. The lowest concentration of SARS-CoV-2 viral copies this assay can detect is 138 copies/mL. A negative result does not preclude SARS-Cov-2 infection and should not be used as the sole basis for treatment or other patient management decisions. A negative result may occur with  improper specimen collection/handling, submission of specimen other than nasopharyngeal swab, presence of viral mutation(s) within the areas targeted by this assay, and inadequate number of viral copies(<138 copies/mL). A negative result must be combined with clinical observations, patient history, and epidemiological information. The expected result is Negative.  Fact Sheet for Patients:  BloggerCourse.com  Fact Sheet for Healthcare Providers:  SeriousBroker.it  This test is no t yet approved or cleared by the [place] FDA and  has been authorized for detection and/or diagnosis of SARS-CoV-2  by FDA under an Emergency Use Authorization (EUA). This EUA will remain  in effect (meaning this test can be used) for the duration of the COVID-19 declaration under Section 564(b)(1) of the Act, 21 U.S.C.section 360bbb-3(b)(1), unless the authorization is terminated  or revoked sooner.       Influenza A by PCR NEGATIVE NEGATIVE Final   Influenza B by PCR NEGATIVE NEGATIVE Final    Comment: (NOTE) The Xpert Xpress SARS-CoV-2/FLU/RSV plus assay is intended as an aid in the diagnosis of influenza from Nasopharyngeal swab specimens and should not be used as a sole basis for treatment. Nasal washings and aspirates are unacceptable for Xpert Xpress  SARS-CoV-2/FLU/RSV testing.  Fact Sheet for Patients: BloggerCourse.com  Fact Sheet for Healthcare Providers: SeriousBroker.it  This test is not yet approved or cleared by the [place] FDA and has been authorized for detection and/or diagnosis of SARS-CoV-2 by FDA under an Emergency Use Authorization (EUA). This EUA will remain in effect (meaning this test can be used) for the duration of the COVID-19 declaration under Section 564(b)(1) of the Act, 21 U.S.C. section 360bbb-3(b)(1), unless the authorization is terminated or revoked.  Performed at [facility], [address], [place], [place] [address]   MRSA PCR Screening     Status: None   Collection Time: 05/05/20  2:40 PM   Specimen: Nasal Mucosa; Nasopharyngeal  Result Value Ref Range Status   MRSA by PCR NEGATIVE NEGATIVE Final    Comment:        The GeneXpert MRSA Assay (FDA approved for NASAL specimens only), is one component of a comprehensive MRSA colonization surveillance program. It is not intended to diagnose MRSA infection nor to guide or monitor treatment for MRSA infections. Performed at [facility], [address], [place], [place] [address]     Procedures and diagnostic studies:  US  RENAL  Result Date: 05/05/2020 CLINICAL DATA:  Acute renal insufficiency. Baseline stage III A chronic kidney disease. EXAM: RENAL / URINARY TRACT ULTRASOUND COMPLETE COMPARISON:  CT 02/01/2018 FINDINGS: Right Kidney: Renal measurements: 12.4 x 6.0 x 4.2 cm = volume: 163 mL. Echogenicity within normal limits. No mass or hydronephrosis visualized. Simple exophytic cortical cysts are seen within the upper and lower pole measuring up to 7.0 by 6.3 x 6.5 cm within the upper pole. This appears similar to prior examination. Mild perinephric fluid has developed, new since prior examination. Left Kidney: Renal measurements: 11.2 x 5.4 x 5.1 cm = volume: 160 mL. Echogenicity within normal limits. No mass or hydronephrosis visualized. Mild perinephric fluid has developed, new since prior examination. Bladder: Appears normal for degree of bladder distention. Other: None. IMPRESSION: Interval development of mild bilateral perinephric fluid, possibly inflammatory or related to underlying anasarca. Electronically Signed   By: Helyn Numbers MD   On: 05/05/2020 23:14    Medications:   . [MAR Hold] insulin aspart  0-9 Units Subcutaneous TID WC  . [MAR Hold] latanoprost  1 drop Both Eyes QHS  . [MAR Hold] loratadine  10 mg Oral Daily  . [MAR Hold] pantoprazole  40 mg Intravenous Q12H  . [MAR Hold] rosuvastatin  5 mg Oral Daily   Continuous Infusions: . sodium chloride    . sodium chloride 100 mL/hr at 05/06/20 0455     LOS: 1 day   Joseph Art  [facility]   How to contact the [facility] Attending or Consulting provider 7A - 7P or covering provider during after hours 7P -7A, for this patient?  1. Check the care team in [facility] and look for a) attending/consulting [facility] provider listed and b) the [facility] team listed 2. Log into [email or web address] and use [place]'s universal password to access. If you do not have the password, please contact the hospital operator. 3. Locate the [facility] provider you are looking for under  [facility] and page to a number that you can be directly reached. 4. If you still have difficulty reaching the provider, please page the [facility] (Director on Call) for the Hospitalists listed on amion for assistance.  05/06/2020, 12:16 PM

## 2020-05-06 NOTE — Anesthesia Preprocedure Evaluation (Signed)
Anesthesia Evaluation  Patient identified by MRN, date of birth, ID band Patient awake    Reviewed: Allergy & Precautions, NPO status , Patient's Chart, lab work & pertinent test results  Airway Mallampati: II  TM Distance: >3 FB Neck ROM: Full    Dental  (+) Edentulous Upper, Edentulous Lower   Pulmonary former smoker,    breath sounds clear to auscultation       Cardiovascular hypertension,  Rhythm:Regular Rate:Normal     Neuro/Psych    GI/Hepatic   Endo/Other  diabetes  Renal/GU      Musculoskeletal   Abdominal   Peds  Hematology   Anesthesia Other Findings   Reproductive/Obstetrics                             Anesthesia Physical Anesthesia Plan  ASA: III  Anesthesia Plan: MAC   Post-op Pain Management:    Induction: Intravenous  PONV Risk Score and Plan: Propofol infusion and Ondansetron  Airway Management Planned: Natural Airway and Nasal Cannula  Additional Equipment:   Intra-op Plan:   Post-operative Plan:   Informed Consent: I have reviewed the patients History and Physical, chart, labs and discussed the procedure including the risks, benefits and alternatives for the proposed anesthesia with the patient or authorized representative who has indicated his/her understanding and acceptance.       Plan Discussed with: Anesthesiologist and CRNA  Anesthesia Plan Comments:         Anesthesia Quick Evaluation

## 2020-05-06 NOTE — Anesthesia Postprocedure Evaluation (Signed)
Anesthesia Post Note  Patient: Thomas Joseph  Procedure(s) Performed: ESOPHAGOGASTRODUODENOSCOPY (EGD) (N/A )     Patient location during evaluation: Endoscopy Anesthesia Type: MAC Level of consciousness: awake and alert Pain management: pain level controlled Vital Signs Assessment: post-procedure vital signs reviewed and stable Respiratory status: spontaneous breathing, nonlabored ventilation, respiratory function stable and patient connected to nasal cannula oxygen Cardiovascular status: stable and blood pressure returned to baseline Postop Assessment: no apparent nausea or vomiting Anesthetic complications: no   No complications documented.  Last Vitals:  Vitals:   05/06/20 1323 05/06/20 1613  BP: (!) 145/68 138/67  Pulse: 73   Resp: 18 13  Temp: 36.5 C (!) 36.3 C  SpO2: 98%     Last Pain:  Vitals:   05/06/20 1613  TempSrc: Oral  PainSc:                  Mikhaila Roh COKER

## 2020-05-06 NOTE — Evaluation (Signed)
Physical Therapy Evaluation Patient Details Name: Thomas Joseph MRN: [id number] DOB: 1933/01/28 Today's Date: 05/06/2020   History of Present Illness  pt is an 87 y/o male presenting with new onset of melena.  Pt s/p upper GI 4/26 with no source found.  Colonoscopy pending 4/27.  PMHx:  CKD, DM, HTN, L TKA  Clinical Impression  Pt admitted with/for melena, s/p upper GI, lower GI pending.  Pt needing min guard assist due to lightheadedness and feeling weak..  Pt currently limited functionally due to the problems listed below.  (see problems list.)  Pt will benefit from PT to maximize function and safety to be able to get home safely with available assist.     Follow Up Recommendations No PT follow up    Equipment Recommendations  None recommended by PT    Recommendations for Other Services       Precautions / Restrictions        Mobility  Bed Mobility Overal bed mobility: Modified Independent                  Transfers Overall transfer level: Needs assistance   Transfers: Sit to/from Stand Sit to Stand: Min guard         General transfer comment: used UE's appropriately, waited until his mild light-headedness resided and set when light-headedness returned in standing.  Ambulation/Gait Ambulation/Gait assistance: Min guard Gait Distance (Feet): 5 Feet Assistive device: IV Pole Gait Pattern/deviations: Step-through pattern   Gait velocity interpretation: <1.31 ft/sec, indicative of household ambulator General Gait Details: limited due to light-headedness  Stairs            Wheelchair Mobility    Modified Rankin (Stroke Patients Only)       Balance Overall balance assessment: Needs assistance   Sitting balance-Leahy Scale: Fair     Standing balance support: Single extremity supported;No upper extremity supported Standing balance-Leahy Scale: Fair                               Pertinent Vitals/Pain Pain Assessment:  Faces Faces Pain Scale: No hurt Pain Intervention(s): Monitored during session    Home Living Family/patient expects to be discharged to:: Private residence Living Arrangements: Spouse/significant other;Other (Comment) (children) Available Help at Discharge: Family;Available 24 hours/day Type of Home: House Home Access: Stairs to enter Entrance Stairs-Rails: Doctor, general practice of Steps: 2 to 3 Home Layout: One level Home Equipment: Cane - single point      Prior Function Level of Independence: Independent         Comments: completes AD's and iADL's, drives, runs errands, keeps up the yard.     Hand Dominance        Extremity/Trunk Assessment   Upper Extremity Assessment Upper Extremity Assessment: Overall WFL for tasks assessed    Lower Extremity Assessment Lower Extremity Assessment: Overall WFL for tasks assessed    Cervical / Trunk Assessment Cervical / Trunk Assessment: Normal  Communication   Communication: No difficulties  Cognition Arousal/Alertness: Awake/alert Behavior During Therapy: WFL for tasks assessed/performed Overall Cognitive Status: Within Functional Limits for tasks assessed                                        General Comments General comments (skin integrity, edema, etc.): vss,  Sats on RA mid 90's, HR around 98 bpm  Exercises     Assessment/Plan    PT Assessment Patient needs continued PT services  PT Problem List Decreased activity tolerance;Decreased balance;Decreased mobility       PT Treatment Interventions Gait training;Functional mobility training;Therapeutic activities;Patient/family education    PT Goals (Current goals can be found in the Care Plan section)  Acute Rehab PT Goals Patient Stated Goal: back independent once they find out my problem PT Goal Formulation: With patient Time For Goal Achievement: 05/08/20 Potential to Achieve Goals: Good    Frequency Min 3X/week    Barriers to discharge        Co-evaluation               AM-PAC PT "6 Clicks" Mobility  Outcome Measure Help needed turning from your back to your side while in a flat bed without using bedrails?: None Help needed moving from lying on your back to sitting on the side of a flat bed without using bedrails?: None Help needed moving to and from a bed to a chair (including a wheelchair)?: A Little Help needed standing up from a chair using your arms (e.g., wheelchair or bedside chair)?: A Little Help needed to walk in hospital room?: A Little Help needed climbing 3-5 steps with a railing? : A Little 6 Click Score: 20    End of Session   Activity Tolerance: Patient tolerated treatment well;Other (comment) (lightheadedness limited mobility) Patient left: in bed;with call bell/phone within reach Nurse Communication: Mobility status PT Visit Diagnosis: Other abnormalities of gait and mobility (R26.89);Difficulty in walking, not elsewhere classified (R26.2)    Time: 0045-9977 PT Time Calculation (min) (ACUTE ONLY): 19 min   Charges:   PT Evaluation $PT Eval Moderate Complexity: 1 Mod          05/06/2020  Jacinto Halim., PT Acute Rehabilitation Services [phone]  (pager) [phone]  (office)  Eliseo Gum Zenia Guest 05/06/2020, 5:57 PM

## 2020-05-07 ENCOUNTER — Inpatient Hospital Stay (HOSPITAL_COMMUNITY): Payer: Medicare Other

## 2020-05-07 ENCOUNTER — Encounter (HOSPITAL_COMMUNITY): Payer: Self-pay | Admitting: Internal Medicine

## 2020-05-07 ENCOUNTER — Inpatient Hospital Stay (HOSPITAL_COMMUNITY): Payer: Medicare Other | Admitting: Certified Registered Nurse Anesthetist

## 2020-05-07 ENCOUNTER — Encounter (HOSPITAL_COMMUNITY)
Admission: EM | Disposition: A | Discharge status: Home / Self Care | Payer: Self-pay | Source: Home / Self Care | Attending: Internal Medicine

## 2020-05-07 DIAGNOSIS — K922 Gastrointestinal hemorrhage, unspecified: Secondary | ICD-10-CM | POA: Diagnosis not present

## 2020-05-07 HISTORY — PX: COLONOSCOPY WITH PROPOFOL: SHX5780

## 2020-05-07 LAB — CBC
HCT: 25.1 % — ABNORMAL LOW (ref 39.0–52.0)
HCT: 22.3 % — ABNORMAL LOW (ref 39.0–52.0)
Hemoglobin: 8.3 g/dL — ABNORMAL LOW (ref 13.0–17.0)
Hemoglobin: 7.4 g/dL — ABNORMAL LOW (ref 13.0–17.0)
MCH: 30.3 pg (ref 26.0–34.0)
MCH: 31.2 pg (ref 26.0–34.0)
MCHC: 33.1 g/dL (ref 30.0–36.0)
MCHC: 33.2 g/dL (ref 30.0–36.0)
MCV: 91.6 fL (ref 80.0–100.0)
MCV: 94.1 fL (ref 80.0–100.0)
Platelets: 143 10*3/uL — ABNORMAL LOW (ref 150–400)
Platelets: 129 10*3/uL — ABNORMAL LOW (ref 150–400)
RBC: 2.74 MIL/uL — ABNORMAL LOW (ref 4.22–5.81)
RBC: 2.37 MIL/uL — ABNORMAL LOW (ref 4.22–5.81)
RDW: 15.1 % (ref 11.5–15.5)
RDW: 15.4 % (ref 11.5–15.5)
WBC: 11.9 10*3/uL — ABNORMAL HIGH (ref 4.0–10.5)
WBC: 10.1 10*3/uL (ref 4.0–10.5)
nRBC: 0 % (ref 0.0–0.2)
nRBC: 0 % (ref 0.0–0.2)

## 2020-05-07 LAB — BASIC METABOLIC PANEL
Anion gap: 10 (ref 5–15)
BUN: 59 mg/dL — ABNORMAL HIGH (ref 8–23)
CO2: 18 mmol/L — ABNORMAL LOW (ref 22–32)
Calcium: 7.8 mg/dL — ABNORMAL LOW (ref 8.9–10.3)
Chloride: 113 mmol/L — ABNORMAL HIGH (ref 98–111)
Creatinine, Ser: 1.76 mg/dL — ABNORMAL HIGH (ref 0.61–1.24)
GFR, Estimated: 38 mL/min — ABNORMAL LOW (ref >60)
Glucose, Bld: 141 mg/dL — ABNORMAL HIGH (ref 70–99)
Potassium: 3.7 mmol/L (ref 3.5–5.1)
Sodium: 141 mmol/L (ref 135–145)

## 2020-05-07 LAB — URINALYSIS, ROUTINE W REFLEX MICROSCOPIC
Bilirubin Urine: NEGATIVE
Glucose, UA: 50 mg/dL — AB
Hgb urine dipstick: NEGATIVE
Ketones, ur: 5 mg/dL — AB
Leukocytes,Ua: NEGATIVE
Nitrite: NEGATIVE
Protein, ur: NEGATIVE mg/dL
Specific Gravity, Urine: 1.018 (ref 1.005–1.030)
pH: 5 (ref 5.0–8.0)

## 2020-05-07 LAB — GLUCOSE, CAPILLARY
Glucose-Capillary: 125 mg/dL — ABNORMAL HIGH (ref 70–99)
Glucose-Capillary: 141 mg/dL — ABNORMAL HIGH (ref 70–99)
Glucose-Capillary: 207 mg/dL — ABNORMAL HIGH (ref 70–99)
Glucose-Capillary: 197 mg/dL — ABNORMAL HIGH (ref 70–99)

## 2020-05-07 LAB — PREPARE RBC (CROSSMATCH)

## 2020-05-07 SURGERY — COLONOSCOPY WITH PROPOFOL
Anesthesia: Monitor Anesthesia Care | Laterality: Left

## 2020-05-07 MED ORDER — LIDOCAINE 2% (20 MG/ML) 5 ML SYRINGE
INTRAMUSCULAR | Status: DC | PRN
  Administered 2020-05-07: 40 mg via INTRAVENOUS

## 2020-05-07 MED ORDER — PHENYLEPHRINE HCL (PRESSORS) 10 MG/ML IV SOLN
INTRAVENOUS | Status: DC | PRN
  Administered 2020-05-07 (×2): 120 ug via INTRAVENOUS

## 2020-05-07 MED ORDER — TECHNETIUM TC 99M-LABELED RED BLOOD CELLS IV KIT: 24.4000 | PACK | Freq: Once | INTRAVENOUS | Status: DC | PRN

## 2020-05-07 MED ORDER — EPHEDRINE SULFATE 50 MG/ML IJ SOLN
INTRAMUSCULAR | Status: DC | PRN
  Administered 2020-05-07 (×3): 10 mg via INTRAVENOUS

## 2020-05-07 MED ORDER — PROPOFOL 500 MG/50ML IV EMUL
INTRAVENOUS | Status: DC | PRN
  Administered 2020-05-07: 125 ug/kg/min via INTRAVENOUS

## 2020-05-07 MED ORDER — ONDANSETRON HCL 4 MG/2ML IJ SOLN
4.0000 mg | Freq: Four times a day (QID) | INTRAMUSCULAR | Status: DC | PRN
  Administered 2020-05-07 – 2020-05-10 (×2): 4 mg via INTRAVENOUS
  Filled 2020-05-07 (×2): qty 2

## 2020-05-07 MED ORDER — SODIUM CHLORIDE 0.9% IV SOLUTION: Freq: Once | INTRAVENOUS | Status: DC

## 2020-05-07 MED ORDER — SODIUM CHLORIDE 0.9 % IV SOLN: INTRAVENOUS | Status: DC

## 2020-05-07 MED ORDER — PHENYLEPHRINE HCL-NACL 10-0.9 MG/250ML-% IV SOLN
INTRAVENOUS | Status: DC | PRN
  Administered 2020-05-07: 50 ug/min via INTRAVENOUS

## 2020-05-07 MED ORDER — PROPOFOL 10 MG/ML IV BOLUS
INTRAVENOUS | Status: DC | PRN
  Administered 2020-05-07: 20 mg via INTRAVENOUS

## 2020-05-07 MED ORDER — LACTATED RINGERS IV SOLN
INTRAVENOUS | Status: AC | PRN
  Administered 2020-05-07: 10 mL/h via INTRAVENOUS

## 2020-05-07 MED ORDER — LACTATED RINGERS IV SOLN: INTRAVENOUS | Status: DC | PRN

## 2020-05-07 SURGICAL SUPPLY — 21 items

## 2020-05-07 NOTE — Op Note (Signed)
[facility] Patient Name: Thomas Joseph Procedure Date : 05/07/2020 MRN: [id number] Attending MD: Kathi Der , MD Date of Birth: [birth year] CSN: [id number] Age: 87 Admit Type: Inpatient Procedure:                Colonoscopy Indications:              Last colonoscopy > 10 years ago, Gastrointestinal                            bleeding Providers:                Kathi Der, MD, Roselie Awkward, RN, Beryle Beams, Technician, Virgel Gess, CRNA Referring MD:              Medicines:                Sedation Administered by an Anesthesia Professional Complications:            No immediate complications. Estimated Blood Loss:     Estimated blood loss: none. Procedure:                Pre-Anesthesia Assessment:                           - Prior to the procedure, a History and Physical                            was performed, and patient medications and                            allergies were reviewed. The patient's tolerance of                            previous anesthesia was also reviewed. The risks                            and benefits of the procedure and the sedation                            options and risks were discussed with the patient.                            All questions were answered, and informed consent                            was obtained. Prior Anticoagulants: The patient has                            taken no previous anticoagulant or antiplatelet                            agents. ASA Grade Assessment: III - A patient with  severe systemic disease. After reviewing the risks                            and benefits, the patient was deemed in                            satisfactory condition to undergo the procedure.                           After obtaining informed consent, the colonoscope                            was passed under direct vision. Throughout the                             procedure, the patient's blood pressure, pulse, and                            oxygen saturations were monitored continuously. The                            PCF-[id number] ([id number] Olympus pediatric colonoscope                            was introduced through the anus and advanced to the                            the cecum, identified by appendiceal orifice and                            ileocecal valve. The colonoscopy was performed with                            difficulty due to poor bowel prep with stool                            present. The patient tolerated the procedure well.                            The quality of the bowel preparation was poor. Scope In: 9:03:29 AM Scope Out: 9:29:27 AM Scope Withdrawal Time: 0 hours 4 minutes 57 seconds  Total Procedure Duration: 0 hours 25 minutes 58 seconds  Findings:      Skin tags were found on perianal exam.      Hematin (altered blood/coffee-ground-like material) was found in the       entire colon.      A large amount of liquid semi-liquid stool was found in the entire       colon, interfering with visualization. Lavage of the area was performed       using a large amount, resulting in incomplete clearance with continued       poor visualization.      Multiple large-mouthed diverticula were found in the entire colon.      Internal hemorrhoids were found during retroflexion. The hemorrhoids  were small. Impression:               - Preparation of the colon was poor.                           - Perianal skin tags found on perianal exam.                           - Blood in the entire examined colon.                           - Stool in the entire examined colon.                           - Diverticulosis in the entire examined colon.                           - Internal hemorrhoids.                           - No specimens collected. Recommendation:           - Return patient to hospital ward for ongoing care.                            - Full liquid diet.                           - Continue present medications.                           - Do a GI bleeding (tagged RBC) scan today. Procedure Code(s):        --- Professional ---                           [phone], Colonoscopy, flexible; diagnostic, including                            collection of specimen(s) by brushing or washing,                            when performed (separate procedure) Diagnosis Code(s):        --- Professional ---                           Z76.7, Other hemorrhoids                           K92.2, Gastrointestinal hemorrhage, unspecified                           K64.4, Residual hemorrhoidal skin tags                           K57.30, Diverticulosis of large intestine without  perforation or abscess without bleeding CPT copyright 2019 American Medical Association. All rights reserved. The codes documented in this report are preliminary and upon coder review may  be revised to meet current compliance requirements. Kathi Der, MD Kathi Der, MD 05/07/2020 9:40:21 AM This report has been signed electronically. Number of Addenda: 0

## 2020-05-07 NOTE — Hospital Course (Addendum)
  Hemoglobin 7.4-->6.2---I u PRBC ordered PLT 93 Bun/cr 38/1.78  Stopped prednisone

## 2020-05-07 NOTE — Progress Notes (Signed)
[facility] night shift.  The nursing staff stated that the patient is complaining of nausea and requested something to alleviate this symptom.  Ondansetron 4 mg IVP every 4 hours as needed ordered.  Sanda Klein, MD.

## 2020-05-07 NOTE — Plan of Care (Signed)

## 2020-05-07 NOTE — Plan of Care (Signed)

## 2020-05-07 NOTE — Anesthesia Preprocedure Evaluation (Signed)
Anesthesia Evaluation  Patient identified by MRN, date of birth, ID band Patient awake    Reviewed: Allergy & Precautions, NPO status , Patient's Chart, lab work & pertinent test results  Airway Mallampati: II  TM Distance: >3 FB Neck ROM: Full    Dental  (+) Edentulous Upper, Edentulous Lower   Pulmonary shortness of breath, neg sleep apnea, neg recent URI, former smoker, neg PE Covid-19 Nucleic Acid Test Results Lab Results      Component                Value               Date                      SARSCOV2NAA              NEGATIVE            05/05/2020                SARSCOV2NAA              POSITIVE (A)        02/04/2019                SARSCOV2                 Negative            02/02/2019              breath sounds clear to auscultation       Cardiovascular hypertension, Pt. on medications + Peripheral Vascular Disease   Rhythm:Regular     Neuro/Psych neg Seizures  Neuromuscular disease negative psych ROS   GI/Hepatic negative GI ROS, Neg liver ROS,   Endo/Other  diabetes, Type 2Hypothyroidism Lab Results      Component                Value               Date                      HGBA1C                   7.2 (H)             05/05/2020             Renal/GU CRFRenal diseaseLab Results      Component                Value               Date                      CREATININE               1.76 (H)            05/06/2020           Lab Results      Component                Value               Date                      K  3.7                 05/06/2020                Musculoskeletal  (+) Arthritis ,   Abdominal   Peds  Hematology  (+) Blood dyscrasia, anemia , Lab Results      Component                Value               Date                      WBC                      9.7                 05/06/2020                HGB                      7.4 (L)             05/06/2020                HCT                       21.6 (L)            05/06/2020                MCV                      91.9                05/06/2020                PLT                      157                 05/06/2020              Anesthesia Other Findings   Reproductive/Obstetrics                             Anesthesia Physical Anesthesia Plan  ASA: III  Anesthesia Plan: MAC   Post-op Pain Management:    Induction: Intravenous  PONV Risk Score and Plan: 1 and Propofol infusion and Treatment may vary due to age or medical condition  Airway Management Planned: Nasal Cannula  Additional Equipment: None  Intra-op Plan:   Post-operative Plan:   Informed Consent: I have reviewed the patients History and Physical, chart, labs and discussed the procedure including the risks, benefits and alternatives for the proposed anesthesia with the patient or authorized representative who has indicated his/her understanding and acceptance.     Dental advisory given  Plan Discussed with: CRNA  Anesthesia Plan Comments:         Anesthesia Quick Evaluation

## 2020-05-07 NOTE — Plan of Care (Signed)
  Problem: Education: Goal: Knowledge of General Education information will improve Description: Including pain rating scale, medication(s)/side effects and non-pharmacologic comfort measures Outcome: Progressing   Problem: Health Behavior/Discharge Planning: Goal: Ability to manage health-related needs will improve Outcome: Progressing   Problem: Clinical Measurements: Goal: Ability to maintain clinical measurements within normal limits will improve Outcome: Progressing Goal: Will remain free from infection Outcome: Progressing Goal: Diagnostic test results will improve Outcome: Progressing Goal: Respiratory complications will improve Outcome: Progressing Goal: Cardiovascular complication will be avoided Outcome: Progressing   Problem: Activity: Goal: Risk for activity intolerance will decrease Outcome: Progressing   Problem: Nutrition: Goal: Adequate nutrition will be maintained Outcome: Progressing   Problem: Coping: Goal: Level of anxiety will decrease Outcome: Progressing   Problem: Elimination: Goal: Will not experience complications related to bowel motility Outcome: Progressing Goal: Will not experience complications related to urinary retention Outcome: Progressing   Problem: Pain Managment: Goal: General experience of comfort will improve Outcome: Progressing   Problem: Safety: Goal: Ability to remain free from injury will improve Outcome: Progressing   Problem: Skin Integrity: Goal: Risk for impaired skin integrity will decrease Outcome: Progressing   Problem: Education: Goal: Ability to identify signs and symptoms of gastrointestinal bleeding will improve Outcome: Progressing   Problem: Clinical Measurements: Goal: Complications related to the disease process, condition or treatment will be avoided or minimized Outcome: Progressing

## 2020-05-07 NOTE — Progress Notes (Signed)
PROGRESS NOTE   Thomas Joseph  HKV:[id number] DOB: [birth year] DOA: 05/05/2020 PCP: Mila Palmer, MD  Brief Narrative:   19 black male admitted from home HTN, HLD, DM TY 2 COVID-19 positive 02/08/2019 BPH status post TURP 12/26/2014  New onset melena 05/05/2020-hemoglobin 12.7-->9.6 [facility] gastroenterology consulted on admit Patient transfused 1 unit PRBC abdomen Patient found to have azotemia GI consulted EGD no source of bleeding 4/26 Colonoscopy unrevealing 4/27 secondary to poor prep however blood found in colonoscopy therefore bleeding scan performed and mild positive IR consulted  Hospital-Problem based course Lower GI bleeding ascending colon Dr. Grace Isaac aware of patient-increase vital checks every 2-CBC every 8 If hypotension, tachycardia-nursing has been instructed personally to get stat CBC and alert on-call physician to get multiphase CTA as per my discussion with Dr. Grace Isaac to determine if bleeding is profuse and if there is a need for embolization Dr. Georgiann Cocker aware in addition and if has stabilized, can probably reprep for nonemergent colonoscopy over the next day to day and a half Per GI patient can have clear liquid diet at this time Patient was transfused 2 units PRBC prior procedurally on 4/27 and hemoglobin has maintained above 7.0 DM TY 2 Patient on prednisone for unclear reason Continue sliding scale alone Holding metformin 1000 daily HTN At this time hold amlodipine 10 Zestoretic 1  DVT prophylaxis:  SCD  code Status: Full Family Communication: Discussed with spouse at bedside Disposition:  Status is: Inpatient  Remains inpatient appropriate because:Hemodynamically unstable, Persistent severe electrolyte disturbances and Unsafe d/c plan   Dispo: The patient is from: Home              Anticipated d/c is to: Home              Patient currently is not medically stable to d/c.   Difficult to place patient No       Consultants:    Gastroenterology  Interventional radiology  Procedures:  Upper endoscopy 4/26 Colonoscopy 4/27 Bleeding scan 4/27  Antimicrobials: None   Subjective: No further bleeding-just back from colonoscopy-no chest pain no fever Wife was at bedside and I discussed plan with her  Objective: Vitals:   05/07/20 1000 05/07/20 1010 05/07/20 1031 05/07/20 1604  BP: (!) 158/59 (!) 145/60 (!) 157/77 (!) 145/68  Pulse: 67 78 77 78  Resp: 20 (!) 22 10 19   Temp:  97.9 F (36.6 C) (!) 97.5 F (36.4 C) (!) 97.3 F (36.3 C)  TempSrc:  Axillary Oral Oral  SpO2: 100% 100% 99% 100%  Weight:      Height:        Intake/Output Summary (Last 24 hours) at 05/07/2020 1743 Last data filed at 05/07/2020 1010 Gross per 24 hour  Intake 2712.71 ml  Output 200 ml  Net 2512.71 ml   Filed Weights   05/05/20 1022 05/07/20 0831  Weight: 102 kg 102 kg    Examination: EOMI NCAT no icterus scleral ring noted Throat soft supple no JVD S1-S2 no murmur Abdomen soft no rebound no guarding nondistended obese No lower extremity edema Rectal deferred Neurologically intact Psych euthymic   Data Reviewed: personally reviewed   CBC    Component Value Date/Time   WBC 11.9 (H) 05/07/2020 1230   RBC 2.74 (L) 05/07/2020 1230   HGB 8.3 (L) 05/07/2020 1230   HCT 25.1 (L) 05/07/2020 1230   PLT 143 (L) 05/07/2020 1230   MCV 91.6 05/07/2020 1230   MCH 30.3 05/07/2020 1230   MCHC 33.1 05/07/2020 1230  RDW 15.1 05/07/2020 1230   LYMPHSABS 1.2 05/05/2020 1026   MONOABS 0.6 05/05/2020 1026   EOSABS 0.1 05/05/2020 1026   BASOSABS 0.1 05/05/2020 1026   CMP Latest Ref Rng & Units 05/06/2020 05/06/2020 05/05/2020  Glucose 70 - 99 mg/dL 989(Q) 119(E) 174(Y)  BUN 8 - 23 mg/dL 81(K) 48(J) 85(U)  Creatinine 0.61 - 1.24 mg/dL 3.14(H) 7.02(O) 3.78(H)  Sodium 135 - 145 mmol/L 141 141 139  Potassium 3.5 - 5.1 mmol/L 3.7 3.7 3.6  Chloride 98 - 111 mmol/L 113(H) 111 106  CO2 22 - 32 mmol/L 18(L) 18(L) 22  Calcium  8.9 - 10.3 mg/dL 7.8(L) 7.5(L) 7.9(L)  Total Protein 6.5 - 8.1 g/dL - - 5.4(L)  Total Bilirubin 0.3 - 1.2 mg/dL - - 0.9  Alkaline Phos 38 - 126 U/L - - 50  AST 15 - 41 U/L - - 17  ALT 0 - 44 U/L - - 12     Radiology Studies: NM GI Blood Loss  Result Date: 05/07/2020 CLINICAL DATA:  New onset melena EXAM: NUCLEAR MEDICINE GASTROINTESTINAL BLEEDING SCAN TECHNIQUE: Sequential abdominal images were obtained following intravenous administration of Tc-81m labeled red blood cells. RADIOPHARMACEUTICALS:  24.4 mCi Tc-83m pertechnetate in-vitro labeled red cells. COMPARISON:  CT February 01, 2018 FINDINGS: There is a linear focus of activity in the right upper quadrant which does not conform to normal vasculature. This activity is initially seen in the expected location of the ascending colon and progresses to involve the hepatic flexure. IMPRESSION: Scintigraphic findings consistent with an active gastrointestinal bleed, likely originating in the ascending colon. These results will be called to the ordering clinician or representative by the Radiologist Assistant, and communication documented in the PACS or [employer]. Electronically Signed   By: Maudry Mayhew MD   On: 05/07/2020 15:53   US RENAL  Result Date: 05/05/2020 CLINICAL DATA:  Acute renal insufficiency. Baseline stage III A chronic kidney disease. EXAM: RENAL / URINARY TRACT ULTRASOUND COMPLETE COMPARISON:  CT 02/01/2018 FINDINGS: Right Kidney: Renal measurements: 12.4 x 6.0 x 4.2 cm = volume: 163 mL. Echogenicity within normal limits. No mass or hydronephrosis visualized. Simple exophytic cortical cysts are seen within the upper and lower pole measuring up to 7.0 by 6.3 x 6.5 cm within the upper pole. This appears similar to prior examination. Mild perinephric fluid has developed, new since prior examination. Left Kidney: Renal measurements: 11.2 x 5.4 x 5.1 cm = volume: 160 mL. Echogenicity within normal limits. No mass or hydronephrosis  visualized. Mild perinephric fluid has developed, new since prior examination. Bladder: Appears normal for degree of bladder distention. Other: None. IMPRESSION: Interval development of mild bilateral perinephric fluid, possibly inflammatory or related to underlying anasarca. Electronically Signed   By: Helyn Numbers MD   On: 05/05/2020 23:14     Scheduled Meds: . sodium chloride   Intravenous Once  . insulin aspart  0-9 Units Subcutaneous TID WC  . latanoprost  1 drop Both Eyes QHS  . loratadine  10 mg Oral Daily  . pantoprazole  40 mg Intravenous Q12H  . predniSONE  10 mg Oral Daily  . rosuvastatin  5 mg Oral Daily   Continuous Infusions:   LOS: 2 days   Time spent: 44 minutes including care coordination time with GI and interventional radiology  Rhetta Mura, MD [facility] To contact the attending provider between 7A-7P or the covering provider during after hours 7P-7A, please log into the web site [email or web address] and access using universal [place]  password for that web site. If you do not have the password, please call the hospital operator.  05/07/2020, 5:43 PM

## 2020-05-07 NOTE — Transfer of Care (Signed)
Immediate Anesthesia Transfer of Care Note  Patient: Thomas Joseph  Procedure(s) Performed: COLONOSCOPY WITH PROPOFOL (Left )  Patient Location: Endoscopy Unit  Anesthesia Type:MAC  Level of Consciousness: awake, alert , oriented, patient cooperative and responds to stimulation  Airway & Oxygen Therapy: Patient Spontanous Breathing  Post-op Assessment: Report given to RN and Post -op Vital signs reviewed and stable  Post vital signs: Reviewed and stable  Last Vitals:  Vitals Value Taken Time  BP 119/50 05/07/20 0941  Temp 36.7 C 05/07/20 0937  Pulse 71 05/07/20 0947  Resp 20 05/07/20 0947  SpO2 100 % 05/07/20 0947  Vitals shown include unvalidated device data.  Last Pain:  Vitals:   05/07/20 0937  TempSrc: Oral  PainSc: 0-No pain         Complications: No complications documented.

## 2020-05-08 ENCOUNTER — Inpatient Hospital Stay (HOSPITAL_COMMUNITY): Payer: Medicare Other

## 2020-05-08 ENCOUNTER — Encounter (HOSPITAL_COMMUNITY): Payer: Self-pay | Admitting: Gastroenterology

## 2020-05-08 DIAGNOSIS — K922 Gastrointestinal hemorrhage, unspecified: Secondary | ICD-10-CM | POA: Diagnosis not present

## 2020-05-08 LAB — CBC
HCT: 17.5 % — ABNORMAL LOW (ref 39.0–52.0)
HCT: 26.2 % — ABNORMAL LOW (ref 39.0–52.0)
Hemoglobin: 5.9 g/dL — CL (ref 13.0–17.0)
Hemoglobin: 9 g/dL — ABNORMAL LOW (ref 13.0–17.0)
MCH: 31.6 pg (ref 26.0–34.0)
MCH: 30.9 pg (ref 26.0–34.0)
MCHC: 33.7 g/dL (ref 30.0–36.0)
MCHC: 34.4 g/dL (ref 30.0–36.0)
MCV: 93.6 fL (ref 80.0–100.0)
MCV: 90 fL (ref 80.0–100.0)
Platelets: 129 10*3/uL — ABNORMAL LOW (ref 150–400)
Platelets: 115 10*3/uL — ABNORMAL LOW (ref 150–400)
RBC: 1.87 MIL/uL — ABNORMAL LOW (ref 4.22–5.81)
RBC: 2.91 MIL/uL — ABNORMAL LOW (ref 4.22–5.81)
RDW: 15.5 % (ref 11.5–15.5)
RDW: 15.1 % (ref 11.5–15.5)
WBC: 13.1 10*3/uL — ABNORMAL HIGH (ref 4.0–10.5)
WBC: 15.1 10*3/uL — ABNORMAL HIGH (ref 4.0–10.5)
nRBC: 0.4 % — ABNORMAL HIGH (ref 0.0–0.2)
nRBC: 1 % — ABNORMAL HIGH (ref 0.0–0.2)

## 2020-05-08 LAB — COMPREHENSIVE METABOLIC PANEL
ALT: 11 U/L (ref 0–44)
AST: 20 U/L (ref 15–41)
Albumin: 2.3 g/dL — ABNORMAL LOW (ref 3.5–5.0)
Alkaline Phosphatase: 36 U/L — ABNORMAL LOW (ref 38–126)
Anion gap: 8 (ref 5–15)
BUN: 40 mg/dL — ABNORMAL HIGH (ref 8–23)
CO2: 20 mmol/L — ABNORMAL LOW (ref 22–32)
Calcium: 7.3 mg/dL — ABNORMAL LOW (ref 8.9–10.3)
Chloride: 112 mmol/L — ABNORMAL HIGH (ref 98–111)
Creatinine, Ser: 1.71 mg/dL — ABNORMAL HIGH (ref 0.61–1.24)
GFR, Estimated: 39 mL/min — ABNORMAL LOW (ref >60)
Glucose, Bld: 202 mg/dL — ABNORMAL HIGH (ref 70–99)
Potassium: 4 mmol/L (ref 3.5–5.1)
Sodium: 140 mmol/L (ref 135–145)
Total Bilirubin: 0.4 mg/dL (ref 0.3–1.2)
Total Protein: 4.2 g/dL — ABNORMAL LOW (ref 6.5–8.1)

## 2020-05-08 LAB — PREPARE RBC (CROSSMATCH)

## 2020-05-08 LAB — GLUCOSE, CAPILLARY
Glucose-Capillary: 193 mg/dL — ABNORMAL HIGH (ref 70–99)
Glucose-Capillary: 155 mg/dL — ABNORMAL HIGH (ref 70–99)
Glucose-Capillary: 132 mg/dL — ABNORMAL HIGH (ref 70–99)
Glucose-Capillary: 122 mg/dL — ABNORMAL HIGH (ref 70–99)
Glucose-Capillary: 144 mg/dL — ABNORMAL HIGH (ref 70–99)

## 2020-05-08 MED ORDER — IOHEXOL 350 MG/ML SOLN
80.0000 mL | Freq: Once | INTRAVENOUS | Status: AC | PRN
  Administered 2020-05-08: 80 mL via INTRAVENOUS

## 2020-05-08 MED ORDER — ACETAMINOPHEN 325 MG PO TABS
650.0000 mg | ORAL_TABLET | Freq: Once | ORAL | Status: AC
  Administered 2020-05-08: 650 mg via ORAL
  Filled 2020-05-08: qty 2

## 2020-05-08 MED ORDER — SODIUM CHLORIDE 0.9% FLUSH: 10.0000 mL | INTRAVENOUS | Status: DC | PRN

## 2020-05-08 MED ORDER — SODIUM CHLORIDE 0.9 % IV BOLUS
500.0000 mL | Freq: Once | INTRAVENOUS | Status: AC
  Administered 2020-05-08: 500 mL via INTRAVENOUS

## 2020-05-08 MED ORDER — SODIUM CHLORIDE 0.9% IV SOLUTION: Freq: Once | INTRAVENOUS | Status: AC

## 2020-05-08 MED ORDER — SODIUM CHLORIDE 0.9% FLUSH
10.0000 mL | Freq: Two times a day (BID) | INTRAVENOUS | Status: DC
  Administered 2020-05-08 – 2020-05-13 (×9): 10 mL

## 2020-05-08 MED ORDER — FUROSEMIDE 10 MG/ML IJ SOLN
20.0000 mg | Freq: Once | INTRAMUSCULAR | Status: AC
  Administered 2020-05-08: 20 mg via INTRAVENOUS
  Filled 2020-05-08: qty 2

## 2020-05-08 MED ORDER — DIPHENHYDRAMINE HCL 25 MG PO CAPS
25.0000 mg | ORAL_CAPSULE | Freq: Once | ORAL | Status: AC
  Administered 2020-05-08: 25 mg via ORAL
  Filled 2020-05-08: qty 1

## 2020-05-08 NOTE — Consult Note (Signed)
Chief Complaint: Patient was seen in consultation today for possible visceral/mesenteric arteriogram with embolization Chief Complaint  Patient presents with  . GI Bleeding    Brought in by EMS from home c/o dark tarry stools onset yesterday with one episode of near syncope with standing. Feeling weak.Marland Kitchen      Referring Physician(s): Brahmbhatt,P/Samtani,J  Supervising Physician: Malachy Moan  Patient Status: [facility] - In-pt  History of Present Illness: Thomas Joseph is an 87 y.o. male with past medical history of anemia, BPH, chronic kidney disease, diabetes, hypertension, and obesity who was recently admitted to [facility] with melena and worsening anemia along with intermittent dizziness, fatigue, dyspnea with exertion and nausea.  EGD on 4/26 revealed medium sized hiatal hernia widely patent Schatzki's ring, normal esophagus, normal stomach, normal duodenum, no old or fresh blood was seen.  Colonoscopy on 4/27 revealed altered blood/coffee-ground-like material found in entire colon, multiple large mouth diverticula throughout colon, internal hemorrhoids, poor colon prep.  Nuclear medicine bleeding scan performed on 4/27 revealed findings consistent with active GI bleed likely originating in the ascending colon.  Latest labs include WBC 13.1, hemoglobin 5.9, down from 7.4, platelets 129k, creatinine 1.71, normal PT/INR, COVID-19 negative.  Request now received from GI/TRH for possible visceral arteriogram with embolization.  Past Medical History:  Diagnosis Date  . Anemia   . BPH (benign prostatic hyperplasia)   . Chronic kidney disease (CKD) stage G3a/A1, moderately decreased glomerular filtration rate (GFR) between 45-59 mL/min/1.73 square meter and albuminuria creatinine ratio less than 30 mg/g (HCC) 03/07/2020  . Diabetes mellitus without complication (HCC)   . ED (erectile dysfunction)   . Family history of adverse reaction to anesthesia    son had some problem  years ago, pt unsure of what exact problem was  . Hypertension   . Obesity   . Pneumonia     Past Surgical History:  Procedure Laterality Date  .  LEFT KNEE ARTHROCOPY    . COLONOSCOPY WITH PROPOFOL Left 05/07/2020   Procedure: COLONOSCOPY WITH PROPOFOL;  Surgeon: Kathi Der, MD;  Location: [facility] ENDOSCOPY;  Service: Gastroenterology;  Laterality: Left;  . ESOPHAGOGASTRODUODENOSCOPY N/A 05/06/2020   Procedure: ESOPHAGOGASTRODUODENOSCOPY (EGD);  Surgeon: Willis Modena, MD;  Location: [facility] ENDOSCOPY;  Service: Endoscopy;  Laterality: N/A;  . HERNIA REPAIR    . TRANSURETHRAL RESECTION OF PROSTATE N/A 12/26/2014   Procedure: TRANSURETHRAL RESECTION OF THE PROSTATE;  Surgeon: Malen Gauze, MD;  Location: [facility] ORS;  Service: Urology;  Laterality: N/A;    Allergies: Hydrochlorothiazide, Lipitor [atorvastatin], Tizanidine hcl, and Viagra [sildenafil citrate]  Medications: Prior to Admission medications   Medication Sig Start Date End Date Taking? Authorizing Provider  acetaminophen (TYLENOL) 325 MG tablet Take 325-650 mg by mouth every 12 (twelve) hours as needed for mild pain or headache.   Yes [provider]  amLODipine (NORVASC) 10 MG tablet Take 10 mg by mouth daily.   Yes [provider]  Ascorbic Acid (VITAMIN C) 500 MG CAPS Take 1 tablet by mouth daily. 02/08/19  Yes [provider]  aspirin 81 MG chewable tablet Chew 81 mg by mouth every other day.    Yes [provider]  Blood Pressure Monitor DEVI  10/23/19  Yes [provider]  cetirizine (ZYRTEC) 10 MG tablet Take 10 mg by mouth daily as needed for allergies.   Yes [provider]  hydrocortisone (ANUSOL-HC) 25 MG suppository Place 1 suppository (25 mg total) rectally 2 (two) times daily. 04/15/19  Yes Cathie Hoops,  Amy V, PA-C  latanoprost (XALATAN) 0.005 % ophthalmic solution Place 1 drop into both eyes at bedtime. 01/19/19  Yes [provider]   lisinopril-hydrochlorothiazide (ZESTORETIC) 20-12.5 MG tablet Take 1 tablet by mouth daily. 02/15/19  Yes [provider]  meloxicam (MOBIC) 15 MG tablet Take 15 mg by mouth daily as needed. 03/28/19  Yes [provider]  metFORMIN (GLUCOPHAGE-XR) 500 MG 24 hr tablet Take 1,000 mg by mouth daily.    Yes [provider]  predniSONE (DELTASONE) 10 MG tablet Take 10 mg by mouth daily. 04/07/20  Yes [provider]  rosuvastatin (CRESTOR) 5 MG tablet Take 5 mg by mouth daily. 08/26/19  Yes [provider]  tadalafil (CIALIS) 20 MG tablet Take 20 mg by mouth daily as needed for erectile dysfunction. 08/03/19  Yes [provider]  zinc sulfate 220 (50 Zn) MG capsule Take 220 mg by mouth daily. 02/08/19  Yes [provider]  benazepril (LOTENSIN) 40 MG tablet Take 40 mg by mouth daily.  02/02/19  [provider]  zolpidem (AMBIEN) 10 MG tablet Take 10 mg by mouth at bedtime as needed for sleep.   02/02/19  [provider]     Family History  Problem Relation Age of Onset  . CVA Father   . Hypertension Father   . Leukemia Sister   . Thyroid disease Sister   . Cancer Brother     Social History   Socioeconomic History  . Marital status: Married    Spouse name: Not on file  . Number of children: Not on file  . Years of education: Not on file  . Highest education level: Not on file  Occupational History  . Not on file  Tobacco Use  . Smoking status: Former Smoker    Quit date: 11/28/1984    Years since quitting: 35.4  . Smokeless tobacco: Never Used  Substance and Sexual Activity  . Alcohol use: Not Currently    Comment: occasional glass of wine  . Drug use: No  . Sexual activity: Not on file  Other Topics Concern  . Not on file  Social History Narrative  . Not on file   Social Determinants of Health   Financial Resource Strain: Not on file  Food Insecurity: Not on file  Transportation Needs: Not on file   Physical Activity: Not on file  Stress: Not on file  Social Connections: Not on file      Review of Systems see above.  Currently denies fever, headache, chest pain, cough, worsening abdominal pain, vomiting.  Does have some dyspnea with exertion, abdominal "pressure", occasional indigestion, occasional back pain  Vital Signs: BP 122/63 (BP Location: Left Arm)   Pulse 84   Temp 97.9 F (36.6 C) (Oral)   Resp 15   Ht 6\' 1"  (1.854 m)   Wt 224 lb 13.9 oz (102 kg)   SpO2 98%   BMI 29.67 kg/m   Physical Exam awake, alert.  Chest with some slightly diminished breath sounds right base, left clear.  Heart with regular rate and rhythm.  Abdomen soft, positive bowel sounds, currently nontender.  No lower extremity edema.  Imaging: NM GI Blood Loss  Result Date: 05/07/2020 CLINICAL DATA:  New onset melena EXAM: NUCLEAR MEDICINE GASTROINTESTINAL BLEEDING SCAN TECHNIQUE: Sequential abdominal images were obtained following intravenous administration of Tc-4156m labeled red blood cells. RADIOPHARMACEUTICALS:  24.4 mCi Tc-1056m pertechnetate in-vitro labeled red cells. COMPARISON:  CT February 01, 2018 FINDINGS: There is a linear  focus of activity in the right upper quadrant which does not conform to normal vasculature. This activity is initially seen in the expected location of the ascending colon and progresses to involve the hepatic flexure. IMPRESSION: Scintigraphic findings consistent with an active gastrointestinal bleed, likely originating in the ascending colon. These results will be called to the ordering clinician or representative by the Radiologist Assistant, and communication documented in the PACS or [employer]. Electronically Signed   By: Maudry Mayhew MD   On: 05/07/2020 15:53   US RENAL  Result Date: 05/05/2020 CLINICAL DATA:  Acute renal insufficiency. Baseline stage III A chronic kidney disease. EXAM: RENAL / URINARY TRACT ULTRASOUND COMPLETE COMPARISON:  CT 02/01/2018 FINDINGS:  Right Kidney: Renal measurements: 12.4 x 6.0 x 4.2 cm = volume: 163 mL. Echogenicity within normal limits. No mass or hydronephrosis visualized. Simple exophytic cortical cysts are seen within the upper and lower pole measuring up to 7.0 by 6.3 x 6.5 cm within the upper pole. This appears similar to prior examination. Mild perinephric fluid has developed, new since prior examination. Left Kidney: Renal measurements: 11.2 x 5.4 x 5.1 cm = volume: 160 mL. Echogenicity within normal limits. No mass or hydronephrosis visualized. Mild perinephric fluid has developed, new since prior examination. Bladder: Appears normal for degree of bladder distention. Other: None. IMPRESSION: Interval development of mild bilateral perinephric fluid, possibly inflammatory or related to underlying anasarca. Electronically Signed   By: Helyn Numbers MD   On: 05/05/2020 23:14    Labs:  CBC: Recent Labs    05/06/20 2253 05/07/20 1230 05/07/20 2114 05/08/20 0600  WBC 9.7 11.9* 10.1 13.1*  HGB 7.4* 8.3* 7.4* 5.9*  HCT 21.6* 25.1* 22.3* 17.5*  PLT 157 143* 129* 129*    COAGS: Recent Labs    05/05/20 1155  INR 1.1    BMP: Recent Labs    05/05/20 1026 05/06/20 0104 05/06/20 1332 05/08/20 0600  NA 139 141 141 140  K 3.6 3.7 3.7 4.0  CL 106 111 113* 112*  CO2 22 18* 18* 20*  GLUCOSE 173* 193* 141* 202*  BUN 57* 62* 59* 40*  CALCIUM 7.9* 7.5* 7.8* 7.3*  CREATININE 1.81* 1.72* 1.76* 1.71*  GFRNONAA 36* 39* 38* 39*    LIVER FUNCTION TESTS: Recent Labs    05/05/20 1026 05/08/20 0600  BILITOT 0.9 0.4  AST 17 20  ALT 12 11  ALKPHOS 50 36*  PROT 5.4* 4.2*  ALBUMIN 2.9* 2.3*    TUMOR MARKERS: No results for input(s): AFPTM, CEA, CA199, CHROMGRNA in the last 8760 hours.  Assessment and Plan: 87 y.o. male with past medical history of anemia, BPH, chronic kidney disease, diabetes, hypertension, and obesity who was recently admitted to [facility] with melena and worsening anemia along with  intermittent dizziness, fatigue, dyspnea with exertion and nausea.  EGD on 4/26 revealed medium sized hiatal hernia widely patent Schatzki's ring, normal esophagus, normal stomach, normal duodenum, no old or fresh blood was seen.  Colonoscopy on 4/27 revealed altered blood/coffee-ground-like material found in entire colon, multiple large mouth diverticula throughout colon, internal hemorrhoids, poor colon prep.  Nuclear medicine bleeding scan performed on 4/27 revealed findings consistent with active GI bleed likely originating in the ascending colon.  Latest labs include WBC 13.1, hemoglobin 5.9, down from 7.4, platelets 129k, creatinine 1.71, normal PT/INR, COVID-19 negative.  Request now received from GI/TRH for possible visceral arteriogram with embolization.  Patient currently afebrile, BP 122/50, heart rate 81.  Case/imaging studies have  been reviewed by Dr. Archer Asa. Patient reports no further bleeding this morning.  No BM this morning.  If patient develops any further episodes of bleeding or hemodynamic instability will proceed with visceral angio with possible embolization.Risks and benefits of procedure were discussed with the patient including, but not limited to bleeding, infection, vascular injury or contrast induced renal failure.  This interventional procedure involves the use of X-rays and because of the nature of the planned procedure, it is possible that we will have prolonged use of X-ray fluoroscopy.  Potential radiation risks to you include (but are not limited to) the following: - A slightly elevated risk for cancer  several years later in life. This risk is typically less than 0.5% percent. This risk is low in comparison to the normal incidence of human cancer, which is 33% for women and 50% for men according to the [employer] Society. - Radiation induced injury can include skin redness, resembling a rash, tissue breakdown / ulcers and hair loss (which can be temporary or  permanent).   The likelihood of either of these occurring depends on the difficulty of the procedure and whether you are sensitive to radiation due to previous procedures, disease, or genetic conditions.   IF your procedure requires a prolonged use of radiation, you will be notified and given written instructions for further action.  It is your responsibility to monitor the irradiated area for the 2 weeks following the procedure and to notify your physician if you are concerned that you have suffered a radiation induced injury.    All of the patient's questions were answered, patient is agreeable to proceed.  Consent signed and in chart.       Thank you for this interesting consult.  I greatly enjoyed meeting DEKLAN MINAR and look forward to participating in their care.  A copy of this report was sent to the requesting provider on this date.  Electronically Signed: D. Jeananne Rama, PA-C 05/08/2020, 9:51 AM   I spent a total of 30 minutes     in face to face in clinical consultation, greater than 50% of which was counseling/coordinating care for possible visceral arteriogram with elicitation

## 2020-05-08 NOTE — Care Management Important Message (Signed)
Important Message  Patient Details  Name: Thomas [facility] MRN: [id number] Date of Birth: 1933/07/27   Medicare Important Message Given:  Yes     Millicent Blazejewski [name] 05/08/2020, 11:54 AM

## 2020-05-08 NOTE — Progress Notes (Signed)
[facility] Gastroenterology Progress Note  Thomas [facility] 87 y.o. [birth year]  CC: GI bleed   Subjective: Patient seen and examined at bedside.  Yesterday's events noted.  Complaining of abdominal bloating and increased flatus.  Hemoglobin dropped to 5.9 this morning.  ROS : Afebrile, negative for chest pain.   Objective: Vital signs in last 24 hours: Vitals:   05/08/20 0600 05/08/20 0724  BP: (!) 119/58 122/63  Pulse:  84  Resp:  15  Temp:  97.9 F (36.6 C)  SpO2:  98%    Physical Exam:  General:  Alert, cooperative, no distress, appears stated age  Head:  Normocephalic, without obvious abnormality, atraumatic  Eyes:  , EOM's intact,   Lungs:    No visible respiratory distress  Heart:  Regular rate and rhythm, S1, S2 normal  Abdomen:   Soft, non-tender, bowel sounds active all four quadrants,  no masses,   Extremities: Extremities normal, atraumatic, no  edema  Pulses: 2+ and symmetric    Lab Results: Recent Labs    05/06/20 0104 05/06/20 1332  NA 141 141  K 3.7 3.7  CL 111 113*  CO2 18* 18*  GLUCOSE 193* 141*  BUN 62* 59*  CREATININE 1.72* 1.76*  CALCIUM 7.5* 7.8*   Recent Labs    05/05/20 1026  AST 17  ALT 12  ALKPHOS 50  BILITOT 0.9  PROT 5.4*  ALBUMIN 2.9*   Recent Labs    05/05/20 1026 05/05/20 2247 05/07/20 2114 05/08/20 0600  WBC 8.7   < > 10.1 13.1*  NEUTROABS 6.8  --   --   --   HGB 9.6*   < > 7.4* 5.9*  HCT 28.7*   < > 22.3* 17.5*  MCV 94.1   < > 94.1 93.6  PLT 185   < > 129* 129*   < > = values in this interval not displayed.   Recent Labs    05/05/20 1155  LABPROT 14.6  INR 1.1      Assessment/Plan: -GI bleed.  EGD 4/26 negative for bleeding.  Colonoscopy yesterday showed poor prep, coffee-ground/old blood in the colon.  Multiple diverticulosis.  No evidence of active bleeding.  Follow-up bleeding scan positive for active bleeding probably in the ascending colon. -Acute blood loss anemia.  Hemoglobin down to 5.9.  Getting  blood transfusion  Recommendations ------------------------ -Case was discussed with hospitalist multiple times yesterday. -Case also discussed with hospitalist this morning as well as Dr. Archer Asa from interventional radiology today. -Initial plan was to get multiphase CT scan but because of elevated creatinine we will hold off on it for now. -According to Dr. Archer Asa , he will take him straight to angiogram if he develops any further episodes of bleeding or hemodynamic instability. -Management plan was discussed with RN at bedside. -GI will follow -Monitor H&H.  Transfuse if hemoglobin less than 7.   Kathi Der MD, FACP 05/08/2020, 9:02 AM  Contact #  [phone]

## 2020-05-08 NOTE — Plan of Care (Signed)

## 2020-05-08 NOTE — Progress Notes (Signed)
PROGRESS NOTE   Thomas Joseph  ZOX:[id number] DOB: 10/16/1933 DOA: 05/05/2020 PCP: Mila Palmer, MD  Brief Narrative:   79 black male admitted from home HTN, HLD, DM TY 2 COVID-19 positive 02/08/2019 BPH status post TURP 12/26/2014  New onset melena 05/05/2020-hemoglobin 12.7-->9.6 [facility] gastroenterology consulted on admit Patient transfused 1 unit PRBC abdomen Patient found to have azotemia GI consulted EGD no source of bleeding 4/26 Colonoscopy unrevealing 4/27 secondary to poor prep however blood found in colonoscopy therefore bleeding scan performed and mild positive IR consulted  Hospital-Problem based course Lower GI bleeding ascending colon Patient was transfused 2 units PRBC peri-procedurally on 4/27 Transfused again 2 units PRBC 4/28 as hemoglobin dropped to 5 Multiphase CT abdomen pelvis shows no acute bleeding  Defer to GI and IR next steps-May probably need colonoscopy with repeat prep? Clear liquid diet only at this time Patient is hemodynamically stable DM TY 2 Patient on prednisone for unclear reason Continue sliding scale alone Holding metformin 1000 daily AKi on admit Azotemia resolved--monitor trends--hold diuretics and ACE HTN At this time hold amlodipine 10 Zestoretic 1  DVT prophylaxis:  SCD  code Status: Full Family Communication: Discussed with spouse at bedside Disposition:  Status is: Inpatient  Remains inpatient appropriate because:Hemodynamically unstable, Persistent severe electrolyte disturbances and Unsafe d/c plan   Dispo: The patient is from: Home              Anticipated d/c is to: Home              Patient currently is not medically stable to d/c.   Difficult to place patient No       Consultants:   Gastroenterology  Interventional radiology  Procedures:  Upper endoscopy 4/26 Colonoscopy 4/27 Bleeding scan 4/27  Antimicrobials: None   Subjective: No further bleeding-just back from colonoscopy-no chest pain no  fever Wife was at bedside and I discussed plan with her  Objective: Vitals:   05/08/20 1252 05/08/20 1308 05/08/20 1309 05/08/20 1513  BP:  (!) 148/63 (!) 148/63 (!) 137/58  Pulse:  71  66  Resp: 18 17 17 15   Temp:  98.4 F (36.9 C) 98.4 F (36.9 C) 98.1 F (36.7 C)  TempSrc:   Axillary Oral  SpO2: 98%   99%  Weight:      Height:        Intake/Output Summary (Last 24 hours) at 05/08/2020 1645 Last data filed at 05/08/2020 1345 Gross per 24 hour  Intake 445 ml  Output 750 ml  Net -305 ml   Filed Weights   05/05/20 1022 05/07/20 0831  Weight: 102 kg 102 kg    Examination: EOMI NCAT no icterus  S1-S2 no murmur Abdomen soft no rebound no guarding nondistended obese No lower extremity edema  Data Reviewed: personally reviewed   CBC    Component Value Date/Time   WBC 13.1 (H) 05/08/2020 0600   RBC 1.87 (L) 05/08/2020 0600   HGB 5.9 (LL) 05/08/2020 0600   HCT 17.5 (L) 05/08/2020 0600   PLT 129 (L) 05/08/2020 0600   MCV 93.6 05/08/2020 0600   MCH 31.6 05/08/2020 0600   MCHC 33.7 05/08/2020 0600   RDW 15.5 05/08/2020 0600   LYMPHSABS 1.2 05/05/2020 1026   MONOABS 0.6 05/05/2020 1026   EOSABS 0.1 05/05/2020 1026   BASOSABS 0.1 05/05/2020 1026   CMP Latest Ref Rng & Units 05/08/2020 05/06/2020 05/06/2020  Glucose 70 - 99 mg/dL 05/08/2020) 811(B) 147(W)  BUN 8 - 23 mg/dL 295(A) 21(H)  62(H)  Creatinine 0.61 - 1.24 mg/dL 1.61(W) 9.60(A) 5.40(J)  Sodium 135 - 145 mmol/L 140 141 141  Potassium 3.5 - 5.1 mmol/L 4.0 3.7 3.7  Chloride 98 - 111 mmol/L 112(H) 113(H) 111  CO2 22 - 32 mmol/L 20(L) 18(L) 18(L)  Calcium 8.9 - 10.3 mg/dL 7.3(L) 7.8(L) 7.5(L)  Total Protein 6.5 - 8.1 g/dL 4.2(L) - -  Total Bilirubin 0.3 - 1.2 mg/dL 0.4 - -  Alkaline Phos 38 - 126 U/L 36(L) - -  AST 15 - 41 U/L 20 - -  ALT 0 - 44 U/L 11 - -     Radiology Studies: NM GI Blood Loss  Result Date: 05/07/2020 CLINICAL DATA:  New onset melena EXAM: NUCLEAR MEDICINE GASTROINTESTINAL BLEEDING SCAN  TECHNIQUE: Sequential abdominal images were obtained following intravenous administration of Tc-26m labeled red blood cells. RADIOPHARMACEUTICALS:  24.4 mCi Tc-59m pertechnetate in-vitro labeled red cells. COMPARISON:  CT February 01, 2018 FINDINGS: There is a linear focus of activity in the right upper quadrant which does not conform to normal vasculature. This activity is initially seen in the expected location of the ascending colon and progresses to involve the hepatic flexure. IMPRESSION: Scintigraphic findings consistent with an active gastrointestinal bleed, likely originating in the ascending colon. These results will be called to the ordering clinician or representative by the Radiologist Assistant, and communication documented in the PACS or [employer]. Electronically Signed   By: Maudry Mayhew MD   On: 05/07/2020 15:53   CT Angio Abd/Pel w/ and/or w/o  Result Date: 05/08/2020 CLINICAL DATA:  87 year old male with suspected lower GI bleed. Assess for active bleeding. EXAM: CTA ABDOMEN AND PELVIS WITHOUT AND WITH CONTRAST TECHNIQUE: Multidetector CT imaging of the abdomen and pelvis was performed using the standard protocol during bolus administration of intravenous contrast. Multiplanar reconstructed images and MIPs were obtained and reviewed to evaluate the vascular anatomy. CONTRAST:  35mL OMNIPAQUE IOHEXOL 350 MG/ML SOLN COMPARISON:  Molecular Imaging GI bleeding scan 05/07/2020; prior CT abdomen/pelvis 02/01/2018 FINDINGS: VASCULAR Aorta: Normal caliber aorta without aneurysm, dissection, vasculitis or significant stenosis. Celiac: Patent without evidence of aneurysm, dissection, vasculitis or significant stenosis. SMA: Patent without evidence of aneurysm, dissection, vasculitis or significant stenosis. Renals: 2 renal arteries bilaterally. All are patent without evidence of aneurysm, dissection, vasculitis, fibromuscular dysplasia or significant stenosis. IMA: Patent without evidence of  aneurysm, dissection, vasculitis or significant stenosis. Inflow: Patent without evidence of aneurysm, dissection, vasculitis or significant stenosis. Proximal Outflow: Bilateral common femoral and visualized portions of the superficial and profunda femoral arteries are patent without evidence of aneurysm, dissection, vasculitis or significant stenosis. Veins: No focal venous abnormality. Review of the MIP images confirms the above findings. NON-VASCULAR Lower chest: No acute abnormality. The intracardiac blood pool is hypodense relative to the adjacent myocardium consistent with anemia. Small hiatal hernia. Hepatobiliary: Gallbladder is unremarkable. No intra or extrahepatic biliary ductal dilatation. Normal hepatic contour morphology. Multiple small circumscribed low-attenuation lesions remain unchanged in size, number and character and remain consistent with small benign cysts or biliary hamartomas. Pancreas: Unremarkable. No pancreatic ductal dilatation or surrounding inflammatory changes. Spleen: Normal in size without focal abnormality. Adrenals/Urinary Tract: Normal adrenal glands. Multiple circumscribed low-attenuation renal lesions bilaterally most consistent with simple and mildly complex cysts. No significant interval change compared to prior imaging from January of 2020. The largest cyst exophytic from the right kidney measures up to 7.4 cm. Unremarkable ureters and bladder. Stomach/Bowel: Colonic diverticular disease without CT evidence of active inflammation. No evidence of contrast  extravasation into the bowel lumen with particular attention paid to the right colon. No focal bowel wall thickening or evidence of obstruction. Lymphatic: No suspicious lymphadenopathy. Reproductive: Prostate is unremarkable. Other: No abdominal wall hernia or abnormality. No abdominopelvic ascites. Musculoskeletal: No acute fracture or aggressive appearing lytic or blastic osseous lesion. IMPRESSION: VASCULAR 1. No  evidence of active GI bleeding at this time. 2. No evidence of acute or clinically significant vascular abnormality. 3. Trace aortic atherosclerotic calcifications. NON-VASCULAR 1. Colonic diverticular disease without CT evidence of active inflammation. 2. The intracardiac blood pool is hypodense relative to the adjacent myocardium consistent with anemia. 3. Stable hepatic and renal cysts. Electronically Signed   By: Malachy Moan M.D.   On: 05/08/2020 15:17     Scheduled Meds: . sodium chloride   Intravenous Once  . insulin aspart  0-9 Units Subcutaneous TID WC  . latanoprost  1 drop Both Eyes QHS  . pantoprazole  40 mg Intravenous Q12H  . predniSONE  10 mg Oral Daily  . sodium chloride flush  10-40 mL Intracatheter Q12H   Continuous Infusions:   LOS: 3 days   Time spent: 44 minutes including care coordination time with GI and interventional radiology  Rhetta Mura, MD [facility] To contact the attending provider between 7A-7P or the covering provider during after hours 7P-7A, please log into the web site [email or web address] and access using universal [place] password for that web site. If you do not have the password, please call the hospital operator.  05/08/2020, 4:45 PM

## 2020-05-08 NOTE — Progress Notes (Signed)
Physical Therapy Treatment Patient Details Name: Thomas Joseph MRN: [id number] DOB: [birth year] Today's Date: 05/08/2020    History of Present Illness pt is an 87 y/o male presenting with new onset of melena.  Pt s/p upper GI 4/26 with no source found.  Colonoscopy pending 4/27.  PMHx:  CKD, DM, HTN, L TKA    PT Comments    Pt is relative listless and fatigued today.  Emphasis on transitions, sit to stand, progression of gait with rollator today.  Safety discussions due to pt so weak and anemic feeling.  Despite potentially low Hgb, pt's vitals good though mildly tachy, but pt still feeling fatigued with activity.    Follow Up Recommendations  Home health PT;Other (comment) (getting visibly weaker.)     Equipment Recommendations  None recommended by PT;Other (comment) (TBA, pt has cane, may need some kind of walker if remains weak and fatigued at d/c)    Recommendations for Other Services       Precautions / Restrictions Precautions Precautions: Fall    Mobility  Bed Mobility Overal bed mobility: Needs Assistance Bed Mobility: Supine to Sit;Sit to Supine     Supine to sit: Min guard Sit to supine: Min guard        Transfers Overall transfer level: Needs assistance Equipment used: 4-wheeled walker Transfers: Sit to/from Stand Sit to Stand: Min guard         General transfer comment: cues for hand placement  Ambulation/Gait Ambulation/Gait assistance: Min guard;Min assist Gait Distance (Feet): 30 Feet (then 15 limited by light-headedness and fatigue) Assistive device: 4-wheeled walker Gait Pattern/deviations: Step-through pattern   Gait velocity interpretation: <1.31 ft/sec, indicative of household ambulator General Gait Details: very short, tentative steps.  Pt appears fatigued/listless.   Stairs             Wheelchair Mobility    Modified Rankin (Stroke Patients Only)       Balance Overall balance assessment: Needs assistance   Sitting  balance-Leahy Scale: Fair     Standing balance support: Single extremity supported;Bilateral upper extremity supported;During functional activity Standing balance-Leahy Scale: Fair                              Cognition Arousal/Alertness: Awake/alert;Lethargic Behavior During Therapy: Flat affect;WFL for tasks assessed/performed Overall Cognitive Status: Within Functional Limits for tasks assessed                                 General Comments: pt appears listless (likely very anemic even after 2 units of blood.      Exercises      General Comments General comments (skin integrity, edema, etc.): SpO2 on RA during activity 98% with HR tachy in the 100's bpm with minimal exertion.      Pertinent Vitals/Pain Pain Assessment: Faces Faces Pain Scale: No hurt Pain Intervention(s): Monitored during session    Home Living                      Prior Function            PT Goals (current goals can now be found in the care plan section) Acute Rehab PT Goals Patient Stated Goal: back independent once they find out my problem PT Goal Formulation: With patient Time For Goal Achievement: 05/14/20 Potential to Achieve Goals: Good Progress towards PT goals: Not progressing  toward goals - comment (anemic and fatigued today)    Frequency    Min 3X/week      PT Plan Current plan remains appropriate    Co-evaluation              AM-PAC PT "6 Clicks" Mobility   Outcome Measure  Help needed turning from your back to your side while in a flat bed without using bedrails?: None Help needed moving from lying on your back to sitting on the side of a flat bed without using bedrails?: None Help needed moving to and from a bed to a chair (including a wheelchair)?: A Little Help needed standing up from a chair using your arms (e.g., wheelchair or bedside chair)?: A Little Help needed to walk in hospital room?: A Little Help needed climbing 3-5  steps with a railing? : A Little 6 Click Score: 20    End of Session   Activity Tolerance: Patient limited by fatigue;Patient tolerated treatment well Patient left: in bed;with call bell/phone within reach;with family/visitor present Nurse Communication: Mobility status PT Visit Diagnosis: Other abnormalities of gait and mobility (R26.89);Difficulty in walking, not elsewhere classified (R26.2)     Time: 1715-1740 PT Time Calculation (min) (ACUTE ONLY): 25 min  Charges:  $Gait Training: 8-22 mins $Therapeutic Activity: 8-22 mins                     05/08/2020  Jacinto Halim., PT Acute Rehabilitation Services [phone]  (pager) [phone]  (office)   Eliseo Gum Shauntel Prest 05/08/2020, 5:59 PM

## 2020-05-09 DIAGNOSIS — K922 Gastrointestinal hemorrhage, unspecified: Secondary | ICD-10-CM | POA: Diagnosis not present

## 2020-05-09 LAB — CBC
HCT: 23.4 % — ABNORMAL LOW (ref 39.0–52.0)
HCT: 21.3 % — ABNORMAL LOW (ref 39.0–52.0)
HCT: 19.5 % — ABNORMAL LOW (ref 39.0–52.0)
Hemoglobin: 8.3 g/dL — ABNORMAL LOW (ref 13.0–17.0)
Hemoglobin: 7.6 g/dL — ABNORMAL LOW (ref 13.0–17.0)
Hemoglobin: 6.6 g/dL — CL (ref 13.0–17.0)
MCH: 31.6 pg (ref 26.0–34.0)
MCH: 31.7 pg (ref 26.0–34.0)
MCH: 31.3 pg (ref 26.0–34.0)
MCHC: 35.5 g/dL (ref 30.0–36.0)
MCHC: 35.7 g/dL (ref 30.0–36.0)
MCHC: 33.8 g/dL (ref 30.0–36.0)
MCV: 89 fL (ref 80.0–100.0)
MCV: 88.8 fL (ref 80.0–100.0)
MCV: 92.4 fL (ref 80.0–100.0)
Platelets: 114 10*3/uL — ABNORMAL LOW (ref 150–400)
Platelets: 112 10*3/uL — ABNORMAL LOW (ref 150–400)
Platelets: 111 10*3/uL — ABNORMAL LOW (ref 150–400)
RBC: 2.63 MIL/uL — ABNORMAL LOW (ref 4.22–5.81)
RBC: 2.4 MIL/uL — ABNORMAL LOW (ref 4.22–5.81)
RBC: 2.11 MIL/uL — ABNORMAL LOW (ref 4.22–5.81)
RDW: 15.5 % (ref 11.5–15.5)
RDW: 15.6 % — ABNORMAL HIGH (ref 11.5–15.5)
RDW: 15.7 % — ABNORMAL HIGH (ref 11.5–15.5)
WBC: 14.7 10*3/uL — ABNORMAL HIGH (ref 4.0–10.5)
WBC: 13 10*3/uL — ABNORMAL HIGH (ref 4.0–10.5)
WBC: 13.3 10*3/uL — ABNORMAL HIGH (ref 4.0–10.5)
nRBC: 0.9 % — ABNORMAL HIGH (ref 0.0–0.2)
nRBC: 0.8 % — ABNORMAL HIGH (ref 0.0–0.2)
nRBC: 1 % — ABNORMAL HIGH (ref 0.0–0.2)

## 2020-05-09 LAB — BPAM RBC
Blood Product Expiration Date: 202205192359
Blood Product Expiration Date: 202205232359
Blood Product Expiration Date: 202205232359
Blood Product Expiration Date: 202205232359
ISSUE DATE / TIME: 202204251734
ISSUE DATE / TIME: 202204270929
ISSUE DATE / TIME: 202204280947
ISSUE DATE / TIME: 202204281248
Unit Type and Rh: 6200
Unit Type and Rh: 6200
Unit Type and Rh: 6200
Unit Type and Rh: 6200

## 2020-05-09 LAB — TYPE AND SCREEN
ABO/RH(D): A POS
Antibody Screen: NEGATIVE
Unit division: 0
Unit division: 0
Unit division: 0
Unit division: 0

## 2020-05-09 LAB — GLUCOSE, CAPILLARY
Glucose-Capillary: 127 mg/dL — ABNORMAL HIGH (ref 70–99)
Glucose-Capillary: 215 mg/dL — ABNORMAL HIGH (ref 70–99)
Glucose-Capillary: 183 mg/dL — ABNORMAL HIGH (ref 70–99)
Glucose-Capillary: 181 mg/dL — ABNORMAL HIGH (ref 70–99)

## 2020-05-09 LAB — PREPARE RBC (CROSSMATCH)

## 2020-05-09 MED ORDER — SODIUM CHLORIDE 0.9% IV SOLUTION: Freq: Once | INTRAVENOUS | Status: DC

## 2020-05-09 NOTE — Plan of Care (Signed)

## 2020-05-09 NOTE — Anesthesia Postprocedure Evaluation (Signed)
Anesthesia Post Note  Patient: Thomas Joseph  Procedure(s) Performed: COLONOSCOPY WITH PROPOFOL (Left )     Patient location during evaluation: Endoscopy Anesthesia Type: MAC Level of consciousness: awake and alert Pain management: pain level controlled Vital Signs Assessment: post-procedure vital signs reviewed and stable Respiratory status: spontaneous breathing, nonlabored ventilation, respiratory function stable and patient connected to nasal cannula oxygen Cardiovascular status: stable and blood pressure returned to baseline Postop Assessment: no apparent nausea or vomiting Anesthetic complications: no   No complications documented.  Last Vitals:  Vitals:   05/09/20 0755 05/09/20 1231  BP: (!) 99/49 (!) 123/55  Pulse: 76 86  Resp: 18 18  Temp: 36.7 C   SpO2: 98% 94%    Last Pain:  Vitals:   05/09/20 0755  TempSrc: Axillary  PainSc: 0-No pain                 Karmine Kauer

## 2020-05-09 NOTE — Progress Notes (Signed)
Patient's Hgb decreased to 6.6 today.  Discussed with Dr. Levora Angel.  2u pRBCs ordered.

## 2020-05-09 NOTE — Progress Notes (Signed)
[facility] Gastroenterology Progress Note  Thomas Joseph 87 y.o. 14-Dec-1933  CC: GI bleed   Subjective: Patient seen and examined at bedside.  Feeling better.  Denies any further bleeding episodes.  Discussed with RN.  ROS : Afebrile, negative for chest pain.   Objective: Vital signs in last 24 hours: Vitals:   05/09/20 0406 05/09/20 0755  BP: (!) 116/41 (!) 99/49  Pulse: 63 76  Resp: 17 18  Temp: 98.2 F (36.8 C) 98.1 F (36.7 C)  SpO2: 98% 98%    Physical Exam:  General:  Alert, cooperative, no distress, appears stated age  Head:  Normocephalic, without obvious abnormality, atraumatic  Eyes:  , EOM's intact,   Lungs:    No visible respiratory distress  Heart:  Regular rate and rhythm, S1, S2 normal  Abdomen:   Soft, non-tender, bowel sounds active all four quadrants,  no masses,   Extremities: Extremities normal, atraumatic, no  edema  Pulses: 2+ and symmetric    Lab Results: Recent Labs    05/06/20 1332 05/08/20 0600  NA 141 140  K 3.7 4.0  CL 113* 112*  CO2 18* 20*  GLUCOSE 141* 202*  BUN 59* 40*  CREATININE 1.76* 1.71*  CALCIUM 7.8* 7.3*   Recent Labs    05/08/20 0600  AST 20  ALT 11  ALKPHOS 36*  BILITOT 0.4  PROT 4.2*  ALBUMIN 2.3*   Recent Labs    05/09/20 0119 05/09/20 0500  WBC 14.7* 13.0*  HGB 8.3* 7.6*  HCT 23.4* 21.3*  MCV 89.0 88.8  PLT 114* 112*   No results for input(s): LABPROT, INR in the last 72 hours.    Assessment/Plan: -GI bleed.  EGD 4/26 negative for bleeding.  Colonoscopy 04/27  showed poor prep, coffee-ground/old blood in the colon.  Multiple diverticulosis.  No evidence of active bleeding.  Follow-up bleeding scan positive for active bleeding probably in the ascending colon. CT angio negative for active bleeding. -Acute blood loss anemia.  Hemoglobin was down to 5.9.  Getting blood transfusion  Recommendations ------------------------ -Mild drop in hemoglobin noted but no further overt/active bleeding. -Start  full liquid diet -If evidence of active bleeding, he will need repeat colonoscopy -Monitor H&H.  Transfuse if hemoglobin less than 7 -GI will follow  Kathi Der MD, FACP 05/09/2020, 8:56 AM  Contact #  [phone]

## 2020-05-09 NOTE — Progress Notes (Deleted)
PROGRESS NOTE   Thomas Joseph  IRS:[id number] DOB: 1933/09/01 DOA: 05/05/2020 PCP: Mila Palmer, MD  Brief Narrative:   59 black male admitted from home HTN, HLD, DM TY 2 COVID-19 positive 02/08/2019 BPH status post TURP 12/26/2014  New onset melena 05/05/2020-hemoglobin 12.7-->9.6 [facility] gastroenterology consulted on admit Patient transfused 1 unit PRBC Patient found to have azotemia GI consulted EGD no source of bleeding 4/26 Colonoscopy unrevealing 4/27 secondary to poor prep however blood found in colonoscopy therefore bleeding scan performed and mild positive IR consulted--nuclear bleeding scan performed showing bleeding ascending colon Multiphase CT subsequently did not reveal a source  Patient has been transfused about 6 to 7 units of PRBC He however has not had a stool despite all of this Further ongoing discussions with GI and IR regarding next steps-probable colonoscopy 5 1 versus 05/12/2020 depending on prep  Hospital-Problem based course Lower GI bleeding ascending colon Hemodynamics have remained stable despite the need for multiple units of blood transfusions Patient was transfused 2 units PRBC peri-procedurally on 4/27 Transfused again 2 units PRBC 4/28 as hemoglobin dropped to 5 Transfused again for hemoglobin 6.6 on 4/29 Transfused  4/30 a.m. 2 units PRBC Multiphase CT abdomen pelvis shows no acute bleeding  Discussed with Dr. Georgiann Cocker 4/29 need for intervention-probably will need prep over the weekend and more urgent colonoscopy subsequently--appreciate Dr. Marge Duncans input today Hematoma left arm at midline site Extravasation as well as hematoma on left arm Remove line, elevate arm place ice PICC line to be placed on opposite side for blood infusion DM TY 2 We will taper and discontinue prednisone given underlying risk of bleeding secondary to this Continue sliding scale alone Holding metformin 1000 daily AKi on admit Mild metabolic acidosis probably  from reequilibrated Azotemia resolved--monitor trends--hold diuretics and ACE Will consider getting lactic acid after transfusion HTN At this time hold amlodipine 10 Zestoretic 1  DVT prophylaxis:  SCD  code Status: Full Family Communication: Discussed with spouse at bedside on multiple days  Disposition:  Status is: Inpatient  Remains inpatient appropriate because:Ongoing diagnostic testing needed not appropriate for outpatient work up and Unsafe d/c plan   Dispo: The patient is from: Home              Anticipated d/c is to: Home              Patient currently is not medically stable to d/c.   Difficult to place patient No       Consultants:   Gastroenterology  Interventional radiology  Procedures:  Upper endoscopy 4/26 Colonoscopy 4/27 Bleeding scan 4/27 Multiphase CT 4/28 showing no bleeding  Antimicrobials: None   Subjective:  Events overnight noted and need for blood noted again Left arm is very swollen and tight from malfunction of midline Otherwise patient is doing okay-he is not dizzy-he is passing flatus but no stool yet No chest pain no fever   Objective: Vitals:   05/09/20 0755 05/09/20 1231 05/09/20 1617 05/09/20 1645  BP: (!) 99/49 (!) 123/55 (!) 129/53 (!) 126/55  Pulse: 76 86 75 79  Resp: 18 18 18 20   Temp: 98.1 F (36.7 C)  97.7 F (36.5 C) 97.8 F (36.6 C)  TempSrc: Axillary  Oral Oral  SpO2: 98% 94% 99% 99%  Weight:      Height:        Intake/Output Summary (Last 24 hours) at 05/09/2020 1719 Last data filed at 05/09/2020 1629 Gross per 24 hour  Intake 565 ml  Output 2250  ml  Net -1685 ml   Filed Weights   05/05/20 1022 05/07/20 0831  Weight: 102 kg 102 kg    Examination:  Awake alert coherent somewhat sleepy no distress EOMI NCAT no focal deficit Chest clinically clear no added sound no rales no rhonchi Abdomen soft, slightly tender in left lower quadrant Neurologically intact no focal deficit  Data Reviewed:  personally reviewed   CBC    Component Value Date/Time   WBC 13.3 (H) 05/09/2020 1147   RBC 2.11 (L) 05/09/2020 1147   HGB 6.6 (LL) 05/09/2020 1147   HCT 19.5 (L) 05/09/2020 1147   PLT 111 (L) 05/09/2020 1147   MCV 92.4 05/09/2020 1147   MCH 31.3 05/09/2020 1147   MCHC 33.8 05/09/2020 1147   RDW 15.7 (H) 05/09/2020 1147   LYMPHSABS 1.2 05/05/2020 1026   MONOABS 0.6 05/05/2020 1026   EOSABS 0.1 05/05/2020 1026   BASOSABS 0.1 05/05/2020 1026   CMP Latest Ref Rng & Units 05/08/2020 05/06/2020 05/06/2020  Glucose 70 - 99 mg/dL 174(B) 449(Q) 759(F)  BUN 8 - 23 mg/dL 63(W) 46(K) 59(D)  Creatinine 0.61 - 1.24 mg/dL 3.57(S) 1.77(L) 3.90(Z)  Sodium 135 - 145 mmol/L 140 141 141  Potassium 3.5 - 5.1 mmol/L 4.0 3.7 3.7  Chloride 98 - 111 mmol/L 112(H) 113(H) 111  CO2 22 - 32 mmol/L 20(L) 18(L) 18(L)  Calcium 8.9 - 10.3 mg/dL 7.3(L) 7.8(L) 7.5(L)  Total Protein 6.5 - 8.1 g/dL 4.2(L) - -  Total Bilirubin 0.3 - 1.2 mg/dL 0.4 - -  Alkaline Phos 38 - 126 U/L 36(L) - -  AST 15 - 41 U/L 20 - -  ALT 0 - 44 U/L 11 - -     Radiology Studies: CT Angio Abd/Pel w/ and/or w/o  Result Date: 05/08/2020 CLINICAL DATA:  87 year old male with suspected lower GI bleed. Assess for active bleeding. EXAM: CTA ABDOMEN AND PELVIS WITHOUT AND WITH CONTRAST TECHNIQUE: Multidetector CT imaging of the abdomen and pelvis was performed using the standard protocol during bolus administration of intravenous contrast. Multiplanar reconstructed images and MIPs were obtained and reviewed to evaluate the vascular anatomy. CONTRAST:  71mL OMNIPAQUE IOHEXOL 350 MG/ML SOLN COMPARISON:  Molecular Imaging GI bleeding scan 05/07/2020; prior CT abdomen/pelvis 02/01/2018 FINDINGS: VASCULAR Aorta: Normal caliber aorta without aneurysm, dissection, vasculitis or significant stenosis. Celiac: Patent without evidence of aneurysm, dissection, vasculitis or significant stenosis. SMA: Patent without evidence of aneurysm, dissection, vasculitis  or significant stenosis. Renals: 2 renal arteries bilaterally. All are patent without evidence of aneurysm, dissection, vasculitis, fibromuscular dysplasia or significant stenosis. IMA: Patent without evidence of aneurysm, dissection, vasculitis or significant stenosis. Inflow: Patent without evidence of aneurysm, dissection, vasculitis or significant stenosis. Proximal Outflow: Bilateral common femoral and visualized portions of the superficial and profunda femoral arteries are patent without evidence of aneurysm, dissection, vasculitis or significant stenosis. Veins: No focal venous abnormality. Review of the MIP images confirms the above findings. NON-VASCULAR Lower chest: No acute abnormality. The intracardiac blood pool is hypodense relative to the adjacent myocardium consistent with anemia. Small hiatal hernia. Hepatobiliary: Gallbladder is unremarkable. No intra or extrahepatic biliary ductal dilatation. Normal hepatic contour morphology. Multiple small circumscribed low-attenuation lesions remain unchanged in size, number and character and remain consistent with small benign cysts or biliary hamartomas. Pancreas: Unremarkable. No pancreatic ductal dilatation or surrounding inflammatory changes. Spleen: Normal in size without focal abnormality. Adrenals/Urinary Tract: Normal adrenal glands. Multiple circumscribed low-attenuation renal lesions bilaterally most consistent with simple and mildly complex cysts. No significant  interval change compared to prior imaging from January of 2020. The largest cyst exophytic from the right kidney measures up to 7.4 cm. Unremarkable ureters and bladder. Stomach/Bowel: Colonic diverticular disease without CT evidence of active inflammation. No evidence of contrast extravasation into the bowel lumen with particular attention paid to the right colon. No focal bowel wall thickening or evidence of obstruction. Lymphatic: No suspicious lymphadenopathy. Reproductive: Prostate is  unremarkable. Other: No abdominal wall hernia or abnormality. No abdominopelvic ascites. Musculoskeletal: No acute fracture or aggressive appearing lytic or blastic osseous lesion. IMPRESSION: VASCULAR 1. No evidence of active GI bleeding at this time. 2. No evidence of acute or clinically significant vascular abnormality. 3. Trace aortic atherosclerotic calcifications. NON-VASCULAR 1. Colonic diverticular disease without CT evidence of active inflammation. 2. The intracardiac blood pool is hypodense relative to the adjacent myocardium consistent with anemia. 3. Stable hepatic and renal cysts. Electronically Signed   By: Malachy Moan M.D.   On: 05/08/2020 15:17     Scheduled Meds: . sodium chloride   Intravenous Once  . sodium chloride   Intravenous Once  . insulin aspart  0-9 Units Subcutaneous TID WC  . latanoprost  1 drop Both Eyes QHS  . pantoprazole  40 mg Intravenous Q12H  . predniSONE  10 mg Oral Daily  . sodium chloride flush  10-40 mL Intracatheter Q12H   Continuous Infusions:   LOS: 4 days   Time spent: 40  Rhetta Mura, MD [facility] To contact the attending provider between 7A-7P or the covering provider during after hours 7P-7A, please log into the web site [email or web address] and access using universal [place] password for that web site. If you do not have the password, please call the hospital operator.  05/09/2020, 5:19 PM

## 2020-05-10 ENCOUNTER — Inpatient Hospital Stay: Payer: Self-pay

## 2020-05-10 DIAGNOSIS — K922 Gastrointestinal hemorrhage, unspecified: Secondary | ICD-10-CM | POA: Diagnosis not present

## 2020-05-10 LAB — GLUCOSE, CAPILLARY
Glucose-Capillary: 169 mg/dL — ABNORMAL HIGH (ref 70–99)
Glucose-Capillary: 189 mg/dL — ABNORMAL HIGH (ref 70–99)
Glucose-Capillary: 209 mg/dL — ABNORMAL HIGH (ref 70–99)
Glucose-Capillary: 169 mg/dL — ABNORMAL HIGH (ref 70–99)

## 2020-05-10 LAB — BASIC METABOLIC PANEL
Anion gap: 9 (ref 5–15)
BUN: 37 mg/dL — ABNORMAL HIGH (ref 8–23)
CO2: 20 mmol/L — ABNORMAL LOW (ref 22–32)
Calcium: 7.6 mg/dL — ABNORMAL LOW (ref 8.9–10.3)
Chloride: 111 mmol/L (ref 98–111)
Creatinine, Ser: 1.65 mg/dL — ABNORMAL HIGH (ref 0.61–1.24)
GFR, Estimated: 41 mL/min — ABNORMAL LOW (ref >60)
Glucose, Bld: 175 mg/dL — ABNORMAL HIGH (ref 70–99)
Potassium: 4.1 mmol/L (ref 3.5–5.1)
Sodium: 140 mmol/L (ref 135–145)

## 2020-05-10 LAB — HEMOGLOBIN AND HEMATOCRIT, BLOOD
HCT: 20.3 % — ABNORMAL LOW (ref 39.0–52.0)
HCT: 22.7 % — ABNORMAL LOW (ref 39.0–52.0)
Hemoglobin: 6.7 g/dL — CL (ref 13.0–17.0)
Hemoglobin: 7.5 g/dL — ABNORMAL LOW (ref 13.0–17.0)

## 2020-05-10 LAB — PREPARE RBC (CROSSMATCH)

## 2020-05-10 MED ORDER — PREDNISONE 2.5 MG PO TABS
2.5000 mg | ORAL_TABLET | Freq: Every day | ORAL | Status: DC
  Administered 2020-05-10 – 2020-05-12 (×3): 2.5 mg via ORAL
  Filled 2020-05-10 (×4): qty 1

## 2020-05-10 MED ORDER — AMIODARONE IV BOLUS ONLY 150 MG/100ML
150.0000 mg | Freq: Once | INTRAVENOUS | Status: AC
  Administered 2020-05-10: 150 mg via INTRAVENOUS
  Filled 2020-05-10: qty 100

## 2020-05-10 MED ORDER — SODIUM CHLORIDE 0.9% FLUSH
10.0000 mL | Freq: Two times a day (BID) | INTRAVENOUS | Status: DC
  Administered 2020-05-10 – 2020-05-13 (×5): 10 mL

## 2020-05-10 MED ORDER — FUROSEMIDE 10 MG/ML IJ SOLN: 20.0000 mg | Freq: Once | INTRAMUSCULAR | Status: DC

## 2020-05-10 MED ORDER — PEG 3350-KCL-NA BICARB-NACL 420 G PO SOLR
4000.0000 mL | Freq: Once | ORAL | Status: AC
  Administered 2020-05-10: 4000 mL via ORAL
  Filled 2020-05-10: qty 4000

## 2020-05-10 MED ORDER — ACETAMINOPHEN 325 MG PO TABS
650.0000 mg | ORAL_TABLET | Freq: Once | ORAL | Status: AC
  Administered 2020-05-10: 650 mg via ORAL
  Filled 2020-05-10: qty 2

## 2020-05-10 MED ORDER — CHLORHEXIDINE GLUCONATE CLOTH 2 % EX PADS
6.0000 | MEDICATED_PAD | Freq: Every day | CUTANEOUS | Status: DC
  Administered 2020-05-10 – 2020-05-20 (×10): 6 via TOPICAL

## 2020-05-10 MED ORDER — SODIUM CHLORIDE 0.9 % IV SOLN: INTRAVENOUS | Status: DC

## 2020-05-10 MED ORDER — SODIUM CHLORIDE 0.9% IV SOLUTION: Freq: Once | INTRAVENOUS | Status: DC

## 2020-05-10 MED ORDER — FUROSEMIDE 10 MG/ML IJ SOLN
20.0000 mg | Freq: Once | INTRAMUSCULAR | Status: AC
  Administered 2020-05-10: 20 mg via INTRAVENOUS
  Filled 2020-05-10: qty 2

## 2020-05-10 MED ORDER — SODIUM CHLORIDE 0.9% FLUSH
10.0000 mL | INTRAVENOUS | Status: DC | PRN
  Administered 2020-05-20: 10 mL

## 2020-05-10 MED ORDER — AMIODARONE HCL IN DEXTROSE 360-4.14 MG/200ML-% IV SOLN
INTRAVENOUS | Status: AC
  Filled 2020-05-10: qty 200

## 2020-05-10 NOTE — Progress Notes (Signed)
Pt had 2 large bowel movements and flipped into afib rvr with rate as high as 140s and bp dropped to 95/80. Pt complained of dizziness after 2nd bowel movement. Both bm were type 7 and dark red. MD on call notified and new orders received. Will continue to monitor.

## 2020-05-10 NOTE — Progress Notes (Signed)
Spoke with RN Westley Gambles concerning this patient's midline infiltrating. The patient over the past two days has been stuck multiple times, with PIVs and a Midline at this time the recomendation would be a Central line or a PICC line. MD Mahala Menghini was made aware of this situation on Thursday as far as the patient being a difficult stick, so the RN was asked to reach back out to the MD.

## 2020-05-10 NOTE — Progress Notes (Signed)
[facility] night shift progressive unit coverage note.  The nursing staff reported that the patient was having atrial fibrillation with a heart rate in the 140s and soft blood pressures with a systolic in the low 100s and 90s.  No chest pain or dyspnea.  His O2 sat is in the mid to high 90s on room air. No previous diagnosis of A. fib.  However, he has a history of irregular heart rhythm on 04/11/2019 while on the pulmonology clinic and was supposed to be evaluated by cardiology.  Amiodarone 150 mg IVP x1 dose, H&H, magnesium level and echocardiogram ordered.  CHA?DS?-VASc Score of at least 5, but anticoagulation has been deferred in the setting of active lower GI bleed.  I discussed the case with Dr. Hyacinth Meeker from cardiology.  He will be added to the cardiology consult list for the morning, but he has asked [place] to call him again if RVR recurs this morning.  Sanda Klein, MD.

## 2020-05-10 NOTE — Progress Notes (Signed)
2nd blood transfusion done then checked H&H was 6.7/20.3 at 1621. Dr. Mahala Menghini notified this matter and Dr. Mahala Menghini ordered transfusion of one unit of blood then no check H&H, just check am lab. Will pass on night shift nurse made ware of it. Patient started to drink NuLytely since 1520. No BM today and there is no sign of active bleeding. Lt. Arm was blood infiltrated in during first blood transfusion in early in the morning. Removed Mid line and applied cold & hot compress alternatively following advice from IV team nurse. HS [employer]

## 2020-05-10 NOTE — Progress Notes (Signed)
Peripherally Inserted Central Catheter Placement  The IV Nurse has discussed with the patient and/or persons authorized to consent for the patient, the purpose of this procedure and the potential benefits and risks involved with this procedure.  The benefits include less needle sticks, lab draws from the catheter, and the patient may be discharged home with the catheter. Risks include, but not limited to, infection, bleeding, blood clot (thrombus formation), and puncture of an artery; nerve damage and irregular heartbeat and possibility to perform a PICC exchange if needed/ordered by physician.  Alternatives to this procedure were also discussed.  Bard Power PICC patient education guide, fact sheet on infection prevention and patient information card has been provided to patient /or left at bedside.    PICC Placement Documentation  PICC Double Lumen 05/10/20 PICC Right Brachial 42 cm 0 cm (Active)  Indication for Insertion or Continuance of Line Poor Vasculature-patient has had multiple peripheral attempts or PIVs lasting less than 24 hours 05/10/20 1043  Exposed Catheter (cm) 0 cm 05/10/20 1043  Site Assessment Clean;Dry;Intact 05/10/20 1043  Lumen #1 Status Flushed;Saline locked;Blood return noted 05/10/20 1043  Lumen #2 Status Flushed;Saline locked;Blood return noted 05/10/20 1043  Dressing Type Transparent 05/10/20 1043  Dressing Status Clean;Dry;Intact 05/10/20 1043  Antimicrobial disc in place? Yes 05/10/20 1043  Safety Lock Not Applicable 05/10/20 1043  Line Care Connections checked and tightened 05/10/20 1043  Line Adjustment (NICU/IV Team Only) No 05/10/20 1043  Dressing Intervention New dressing 05/10/20 1043  Dressing Change Due 05/17/20 05/10/20 1043       Elliot Dally 05/10/2020, 10:44 AM

## 2020-05-10 NOTE — H&P (View-Only) (Signed)
**Thomas Joseph De-Identified via Obfuscation** Thomas Thomas Joseph  Thomas Thomas Joseph 87 y.o. 1933/04/29   Subjective: Complaining of right-sided abdominal pain. No rectal bleeding or BMs overnight. Nurse in room.  Objective: Vital signs: Vitals:   05/10/20 1129 05/10/20 1414  BP: (!) 108/48 (!) 161/49  Pulse: 85 73  Resp: 17 (!) 21  Temp: 97.9 F (36.6 C) 98.2 F (36.8 C)  SpO2: 98% 98%    Physical Exam: Gen: lethargic, elderly, well-nourished, no acute distress  HEENT: anicteric sclera CV: RRR Chest: CTA B Abd: right-sided tenderness without guarding, soft, nondistended, +BS Ext: no edema; left arm hematoma  Lab Results: Recent Labs    05/08/20 0600 05/10/20 0112  NA 140 140  K 4.0 4.1  CL 112* 111  CO2 20* 20*  GLUCOSE 202* 175*  BUN 40* 37*  CREATININE 1.71* 1.65*  CALCIUM 7.3* 7.6*   Recent Labs    05/08/20 0600  AST 20  ALT 11  ALKPHOS 36*  BILITOT 0.4  PROT 4.2*  ALBUMIN 2.3*   Recent Labs    05/09/20 1147 05/10/20 0112  WBC 13.3* 14.1*  NEUTROABS  --  11.6*  HGB 6.6* 6.3*  HCT 19.5* 18.7*  MCV 92.4 92.6  PLT 111* 109*      Assessment/Plan: GI bleed - Hgb 6.3 following 2 U PRBCs overnight (Hgb 6.6 yesterday afternoon). Hematoma contributing to anemia but needs right side of colon evaluated with previous positive NM bleeding scan (05/07/20) showing active bleeding in the ascending colon. Patient willing to drink colon prep and encouraged to drink all of it. Clear liquid diet. NPO p MN. Colonoscopy tomorrow morning. Supportive care.   [name] 05/10/2020, 2:45 PM  Questions please call [phone] ID: Thomas Thomas Joseph, male   DOB: Oct 01, 1933, 87 y.o.   MRN: [id number]

## 2020-05-10 NOTE — Progress Notes (Signed)
There were three different time Lasix ordered on the MAR. Talked to pharmacist regarding this matter, he didn't talk to Dr. Mahala Menghini, but scheduled 1815, 1915, 2015. Talked Dr. Mahala Menghini to verify the lasix order, Dr. Mahala Menghini wanted to give only one time after transfusion. Pharmacist made ware of this and changed orders. HS [employer]

## 2020-05-10 NOTE — Progress Notes (Signed)
[facility] night shift.  The patient's most recent hemoglobin level was 6.3 g/dL despite multiple units of PRBC transfused.  Another 2 PRBC units ordered.  Please see Dr. Pandora Leiter note for further details.  Sanda Klein, MD.

## 2020-05-10 NOTE — Progress Notes (Signed)
[facility] Progress Note  Thomas Joseph 86 y.o. 05/22/1933   Subjective: Complaining of right-sided abdominal pain. No rectal bleeding or BMs overnight. Nurse in room.  Objective: Vital signs: Vitals:   05/10/20 1129 05/10/20 1414  BP: (!) 108/48 (!) 161/49  Pulse: 85 73  Resp: 17 (!) 21  Temp: 97.9 F (36.6 C) 98.2 F (36.8 C)  SpO2: 98% 98%    Physical Exam: Gen: lethargic, elderly, well-nourished, no acute distress  HEENT: anicteric sclera CV: RRR Chest: CTA B Abd: right-sided tenderness without guarding, soft, nondistended, +BS Ext: no edema; left arm hematoma  Lab Results: Recent Labs    05/08/20 0600 05/10/20 0112  NA 140 140  K 4.0 4.1  CL 112* 111  CO2 20* 20*  GLUCOSE 202* 175*  BUN 40* 37*  CREATININE 1.71* 1.65*  CALCIUM 7.3* 7.6*   Recent Labs    05/08/20 0600  AST 20  ALT 11  ALKPHOS 36*  BILITOT 0.4  PROT 4.2*  ALBUMIN 2.3*   Recent Labs    05/09/20 1147 05/10/20 0112  WBC 13.3* 14.1*  NEUTROABS  --  11.6*  HGB 6.6* 6.3*  HCT 19.5* 18.7*  MCV 92.4 92.6  PLT 111* 109*      Assessment/Plan: GI bleed - Hgb 6.3 following 2 U PRBCs overnight (Hgb 6.6 yesterday afternoon). Hematoma contributing to anemia but needs right side of colon evaluated with previous positive NM bleeding scan (05/07/20) showing active bleeding in the ascending colon. Patient willing to drink colon prep and encouraged to drink all of it. Clear liquid diet. NPO p MN. Colonoscopy tomorrow morning. Supportive care.   Thomas Joseph 05/10/2020, 2:45 PM  Questions please call [phone] ID: Thomas Joseph, male   DOB: 05/19/1933, 86 y.o.   MRN: [id number]  

## 2020-05-10 NOTE — Progress Notes (Signed)
Pt IV appears to have infiltrated while infusing blood products. MD and IV team notified. Will continue to monitor.

## 2020-05-11 ENCOUNTER — Inpatient Hospital Stay (HOSPITAL_COMMUNITY): Payer: Medicare Other

## 2020-05-11 ENCOUNTER — Inpatient Hospital Stay (HOSPITAL_COMMUNITY): Payer: Medicare Other | Admitting: Anesthesiology

## 2020-05-11 ENCOUNTER — Encounter (HOSPITAL_COMMUNITY)
Admission: EM | Disposition: A | Discharge status: Home / Self Care | Payer: Self-pay | Source: Home / Self Care | Attending: Internal Medicine

## 2020-05-11 ENCOUNTER — Encounter (HOSPITAL_COMMUNITY): Payer: Self-pay | Admitting: Internal Medicine

## 2020-05-11 DIAGNOSIS — I4891 Unspecified atrial fibrillation: Secondary | ICD-10-CM

## 2020-05-11 DIAGNOSIS — K922 Gastrointestinal hemorrhage, unspecified: Secondary | ICD-10-CM | POA: Diagnosis not present

## 2020-05-11 DIAGNOSIS — I2699 Other pulmonary embolism without acute cor pulmonale: Secondary | ICD-10-CM | POA: Diagnosis not present

## 2020-05-11 HISTORY — PX: IR EMBO ART  VEN HEMORR LYMPH EXTRAV  INC GUIDE ROADMAPPING: IMG5450

## 2020-05-11 HISTORY — PX: IR ANGIOGRAM SELECTIVE EACH ADDITIONAL VESSEL: IMG667

## 2020-05-11 HISTORY — PX: COLONOSCOPY WITH PROPOFOL: SHX5780

## 2020-05-11 HISTORY — PX: IR IVC FILTER PLMT / S&I /IMG GUID/MOD SED: IMG701

## 2020-05-11 HISTORY — PX: IR VENOGRAM RENAL BI: IMG682

## 2020-05-11 HISTORY — PX: IR ANGIOGRAM VISCERAL SELECTIVE: IMG657

## 2020-05-11 HISTORY — PX: IR US GUIDE VASC ACCESS RIGHT: IMG2390

## 2020-05-11 LAB — CBC WITH DIFFERENTIAL/PLATELET
Abs Immature Granulocytes: 0.17 10*3/uL — ABNORMAL HIGH (ref 0.00–0.07)
Basophils Absolute: 0 10*3/uL (ref 0.0–0.1)
Basophils Relative: 0 %
Eosinophils Absolute: 0.1 10*3/uL (ref 0.0–0.5)
Eosinophils Relative: 0 %
HCT: 18.7 % — ABNORMAL LOW (ref 39.0–52.0)
Hemoglobin: 6.3 g/dL — CL (ref 13.0–17.0)
Immature Granulocytes: 1 %
Lymphocytes Relative: 9 %
Lymphs Abs: 1.3 10*3/uL (ref 0.7–4.0)
MCH: 31.2 pg (ref 26.0–34.0)
MCHC: 33.7 g/dL (ref 30.0–36.0)
MCV: 92.6 fL (ref 80.0–100.0)
Monocytes Absolute: 1.1 10*3/uL — ABNORMAL HIGH (ref 0.1–1.0)
Monocytes Relative: 8 %
Neutro Abs: 11.6 10*3/uL — ABNORMAL HIGH (ref 1.7–7.7)
Neutrophils Relative %: 82 %
Platelets: 109 10*3/uL — ABNORMAL LOW (ref 150–400)
RBC: 2.02 MIL/uL — ABNORMAL LOW (ref 4.22–5.81)
RDW: 16 % — ABNORMAL HIGH (ref 11.5–15.5)
WBC: 14.1 10*3/uL — ABNORMAL HIGH (ref 4.0–10.5)
nRBC: 1.2 % — ABNORMAL HIGH (ref 0.0–0.2)

## 2020-05-11 LAB — CBC
HCT: 21.1 % — ABNORMAL LOW (ref 39.0–52.0)
HCT: 18.6 % — ABNORMAL LOW (ref 39.0–52.0)
Hemoglobin: 7 g/dL — ABNORMAL LOW (ref 13.0–17.0)
Hemoglobin: 6.3 g/dL — CL (ref 13.0–17.0)
MCH: 29.8 pg (ref 26.0–34.0)
MCH: 30.3 pg (ref 26.0–34.0)
MCHC: 33.2 g/dL (ref 30.0–36.0)
MCHC: 33.9 g/dL (ref 30.0–36.0)
MCV: 89.8 fL (ref 80.0–100.0)
MCV: 89.4 fL (ref 80.0–100.0)
Platelets: 111 10*3/uL — ABNORMAL LOW (ref 150–400)
Platelets: 88 10*3/uL — ABNORMAL LOW (ref 150–400)
RBC: 2.35 MIL/uL — ABNORMAL LOW (ref 4.22–5.81)
RBC: 2.08 MIL/uL — ABNORMAL LOW (ref 4.22–5.81)
RDW: 17.4 % — ABNORMAL HIGH (ref 11.5–15.5)
RDW: 15.9 % — ABNORMAL HIGH (ref 11.5–15.5)
WBC: 18.3 10*3/uL — ABNORMAL HIGH (ref 4.0–10.5)
WBC: 12.6 10*3/uL — ABNORMAL HIGH (ref 4.0–10.5)
nRBC: 2.3 % — ABNORMAL HIGH (ref 0.0–0.2)
nRBC: 2.7 % — ABNORMAL HIGH (ref 0.0–0.2)

## 2020-05-11 LAB — COMPREHENSIVE METABOLIC PANEL
ALT: 13 U/L (ref 0–44)
AST: 20 U/L (ref 15–41)
Albumin: 2.2 g/dL — ABNORMAL LOW (ref 3.5–5.0)
Alkaline Phosphatase: 38 U/L (ref 38–126)
Anion gap: 8 (ref 5–15)
BUN: 43 mg/dL — ABNORMAL HIGH (ref 8–23)
CO2: 21 mmol/L — ABNORMAL LOW (ref 22–32)
Calcium: 7.2 mg/dL — ABNORMAL LOW (ref 8.9–10.3)
Chloride: 109 mmol/L (ref 98–111)
Creatinine, Ser: 1.76 mg/dL — ABNORMAL HIGH (ref 0.61–1.24)
GFR, Estimated: 38 mL/min — ABNORMAL LOW (ref >60)
Glucose, Bld: 186 mg/dL — ABNORMAL HIGH (ref 70–99)
Potassium: 3.6 mmol/L (ref 3.5–5.1)
Sodium: 138 mmol/L (ref 135–145)
Total Bilirubin: 0.5 mg/dL (ref 0.3–1.2)
Total Protein: 3.9 g/dL — ABNORMAL LOW (ref 6.5–8.1)

## 2020-05-11 LAB — GLUCOSE, CAPILLARY
Glucose-Capillary: 171 mg/dL — ABNORMAL HIGH (ref 70–99)
Glucose-Capillary: 151 mg/dL — ABNORMAL HIGH (ref 70–99)
Glucose-Capillary: 161 mg/dL — ABNORMAL HIGH (ref 70–99)
Glucose-Capillary: 222 mg/dL — ABNORMAL HIGH (ref 70–99)

## 2020-05-11 LAB — MAGNESIUM: Magnesium: 1.5 mg/dL — ABNORMAL LOW (ref 1.7–2.4)

## 2020-05-11 LAB — PREPARE RBC (CROSSMATCH)

## 2020-05-11 SURGERY — COLONOSCOPY WITH PROPOFOL
Anesthesia: Monitor Anesthesia Care

## 2020-05-11 MED ORDER — SODIUM CHLORIDE 0.9% IV SOLUTION: Freq: Once | INTRAVENOUS | Status: DC

## 2020-05-11 MED ORDER — LACTATED RINGERS IV SOLN: INTRAVENOUS | Status: DC

## 2020-05-11 MED ORDER — SODIUM CHLORIDE 0.9% IV SOLUTION: Freq: Once | INTRAVENOUS | Status: AC

## 2020-05-11 MED ORDER — MIDAZOLAM HCL 2 MG/2ML IJ SOLN
INTRAMUSCULAR | Status: AC | PRN
  Administered 2020-05-11 (×2): 0.5 mg via INTRAVENOUS

## 2020-05-11 MED ORDER — MIDAZOLAM HCL 2 MG/2ML IJ SOLN
INTRAMUSCULAR | Status: AC
  Filled 2020-05-11: qty 2

## 2020-05-11 MED ORDER — LIDOCAINE 2% (20 MG/ML) 5 ML SYRINGE
INTRAMUSCULAR | Status: DC | PRN
  Administered 2020-05-11: 60 mg via INTRAVENOUS

## 2020-05-11 MED ORDER — PHENYLEPHRINE 40 MCG/ML (10ML) SYRINGE FOR IV PUSH (FOR BLOOD PRESSURE SUPPORT)
PREFILLED_SYRINGE | INTRAVENOUS | Status: DC | PRN
  Administered 2020-05-11: 120 ug via INTRAVENOUS
  Administered 2020-05-11 (×2): 80 ug via INTRAVENOUS
  Administered 2020-05-11: 120 ug via INTRAVENOUS
  Administered 2020-05-11: 80 ug via INTRAVENOUS

## 2020-05-11 MED ORDER — LIDOCAINE HCL 1 % IJ SOLN
INTRAMUSCULAR | Status: AC
  Filled 2020-05-11: qty 20

## 2020-05-11 MED ORDER — FENTANYL CITRATE (PF) 100 MCG/2ML IJ SOLN
INTRAMUSCULAR | Status: AC
  Filled 2020-05-11: qty 2

## 2020-05-11 MED ORDER — PROPOFOL 500 MG/50ML IV EMUL
INTRAVENOUS | Status: DC | PRN
  Administered 2020-05-11: 75 ug/kg/min via INTRAVENOUS

## 2020-05-11 MED ORDER — IOHEXOL 300 MG/ML  SOLN
75.0000 mL | Freq: Once | INTRAMUSCULAR | Status: AC | PRN
  Administered 2020-05-11: 75 mL via INTRAVENOUS

## 2020-05-11 MED ORDER — ALBUMIN HUMAN 5 % IV SOLN: INTRAVENOUS | Status: DC | PRN

## 2020-05-11 MED ORDER — GELATIN ABSORBABLE 12-7 MM EX MISC
CUTANEOUS | Status: AC
  Filled 2020-05-11: qty 1

## 2020-05-11 MED ORDER — FENTANYL CITRATE (PF) 100 MCG/2ML IJ SOLN
INTRAMUSCULAR | Status: AC | PRN
  Administered 2020-05-11 (×2): 25 ug via INTRAVENOUS

## 2020-05-11 MED ORDER — MAGNESIUM SULFATE 2 GM/50ML IV SOLN
2.0000 g | Freq: Once | INTRAVENOUS | Status: AC
  Administered 2020-05-11: 2 g via INTRAVENOUS
  Filled 2020-05-11: qty 50

## 2020-05-11 MED ORDER — IOHEXOL 300 MG/ML  SOLN: 100.0000 mL | Freq: Once | INTRAMUSCULAR | Status: DC | PRN

## 2020-05-11 MED ORDER — PROPOFOL 10 MG/ML IV BOLUS
INTRAVENOUS | Status: DC | PRN
  Administered 2020-05-11 (×3): 20 mg via INTRAVENOUS

## 2020-05-11 MED ORDER — EPHEDRINE SULFATE-NACL 50-0.9 MG/10ML-% IV SOSY
PREFILLED_SYRINGE | INTRAVENOUS | Status: DC | PRN
  Administered 2020-05-11: 15 mg via INTRAVENOUS

## 2020-05-11 SURGICAL SUPPLY — 22 items

## 2020-05-11 NOTE — Progress Notes (Signed)
Chief Complaint: Patient was seen today for GI bleed   Supervising Physician: Markus Daft  Patient Status: [facility] [place] - In-pt  Subjective: IR following for ongoing intermittent lower GI bleed. Had been seen a few days ago, pt stable and previous CTA negative for active extrav. Pt with recurrent bloody stools. Endoscopy this am finds fresh blood but couldn't identify source. Stat CTA performed, this time showing active bleeding in ascending colon.   Objective: Physical Exam: BP (!) 150/79 (BP Location: Right Leg)   Pulse 67   Temp 97.6 F (36.4 C) (Oral)   Resp 17   Ht $R'6\' 1"'mz$  (1.854 m)   Wt 102 kg   SpO2 97%   BMI 29.67 kg/m  A&O x 3, feels tired. Lungs: clear Heart: Regular Abd: soft, NT Ext: excellent bilat femoral pulse.   Current Facility-Administered Medications:  .  0.9 %  sodium chloride infusion (Manually program via Guardrails IV Fluids), , Intravenous, Once, Wilford Corner, MD, Stopped at 05/07/20 1047 .  0.9 %  sodium chloride infusion (Manually program via Guardrails IV Fluids), , Intravenous, Once, Wilford Corner, MD .  0.9 %  sodium chloride infusion (Manually program via Guardrails IV Fluids), , Intravenous, Once, Wilford Corner, MD .  0.9 %  sodium chloride infusion (Manually program via Guardrails IV Fluids), , Intravenous, Once, Wilford Corner, MD .  0.9 %  sodium chloride infusion (Manually program via Guardrails IV Fluids), , Intravenous, Once, Wilford Corner, MD .  0.9 %  sodium chloride infusion, , Intravenous, Continuous, Wilford Corner, MD .  acetaminophen (TYLENOL) tablet 325-650 mg, 325-650 mg, Oral, Q12H PRN, Wilford Corner, MD .  Chlorhexidine Gluconate Cloth 2 % PADS 6 each, 6 each, Topical, Daily, Wilford Corner, MD, 6 each at 05/10/20 1500 .  insulin aspart (novoLOG) injection 0-9 Units, 0-9 Units, Subcutaneous, TID WC, Wilford Corner, MD, 2 Units at 05/11/20 [phone] .  lactated ringers infusion, , Intravenous, Continuous,  Wilford Corner, MD, Last Rate: 20 mL/hr at 05/11/20 0845, Restarted at 05/11/20 0935 .  latanoprost (XALATAN) 0.005 % ophthalmic solution 1 drop, 1 drop, Both Eyes, QHS, Schooler, Vincent, MD, 1 drop at 05/10/20 2148 .  lidocaine (XYLOCAINE) 1 % (with pres) injection, , , ,  .  ondansetron (ZOFRAN) injection 4 mg, 4 mg, Intravenous, Q6H PRN, Wilford Corner, MD, 4 mg at 05/10/20 2336 .  pantoprazole (PROTONIX) injection 40 mg, 40 mg, Intravenous, Q12H, Wilford Corner, MD, 40 mg at 05/11/20 1204 .  predniSONE (DELTASONE) tablet 2.5 mg, 2.5 mg, Oral, Daily, Wilford Corner, MD, 2.5 mg at 05/11/20 1204 .  sodium chloride flush (NS) 0.9 % injection 10-40 mL, 10-40 mL, Intracatheter, Q12H, Wilford Corner, MD, 10 mL at 05/11/20 1211 .  sodium chloride flush (NS) 0.9 % injection 10-40 mL, 10-40 mL, Intracatheter, PRN, Wilford Corner, MD .  sodium chloride flush (NS) 0.9 % injection 10-40 mL, 10-40 mL, Intracatheter, Q12H, Wilford Corner, MD, 10 mL at 05/10/20 2148 .  sodium chloride flush (NS) 0.9 % injection 10-40 mL, 10-40 mL, Intracatheter, PRN, Wilford Corner, MD .  technetium labeled red blood cells (ULTRATAG) injection kit 70.7 millicurie, 86.7 millicurie, Intravenous, Once PRN, Wilford Corner, MD  Labs: CBC Recent Labs    05/10/20 0112 05/10/20 1621 05/10/20 2310 05/11/20 0545  WBC 14.1*  --   --  18.3*  HGB 6.3*   < > 7.5* 7.0*  HCT 18.7*   < > 22.7* 21.1*  PLT 109*  --   --  111*   < > =  values in this interval not displayed.   BMET Recent Labs    05/10/20 0112 05/11/20 0545  NA 140 138  K 4.1 3.6  CL 111 109  CO2 20* 21*  GLUCOSE 175* 186*  BUN 37* 43*  CREATININE 1.65* 1.76*  CALCIUM 7.6* 7.2*   LFT Recent Labs    05/11/20 0545  PROT 3.9*  ALBUMIN 2.2*  AST 20  ALT 13  ALKPHOS 38  BILITOT 0.5   PT/INR No results for input(s): LABPROT, INR in the last 72 hours.   Studies/Results: [place] EKG SITE RITE  Result Date: 05/10/2020 If Site  Rite image not attached, placement could not be confirmed due to current cardiac rhythm.  CT Angio Abd/Pel w/ and/or w/o  Addendum Date: 05/11/2020   ADDENDUM REPORT: 05/11/2020 13:42 ADDENDUM: After speaking with Dr. Anselm Pancoast, there is a focus of contrast extravasation in the ascending colon near the hepatic flexure associated with a diverticulum. This is compatible with active extravasation in the right colon. Electronically Signed   By: Misty Stanley M.D.   On: 05/11/2020 13:42   Result Date: 05/11/2020 CLINICAL DATA:  GI bleed. EXAM: CTA ABDOMEN AND PELVIS WITHOUT AND WITH CONTRAST TECHNIQUE: Multidetector CT imaging of the abdomen and pelvis was performed using the standard protocol during bolus administration of intravenous contrast. Multiplanar reconstructed images and MIPs were obtained and reviewed to evaluate the vascular anatomy. CONTRAST:  48m OMNIPAQUE IOHEXOL 300 MG/ML  SOLN COMPARISON:  05/08/2020 FINDINGS: VASCULAR Aorta: Normal caliber aorta without aneurysm, dissection, vasculitis or significant stenosis. Celiac: Patent without evidence of aneurysm, dissection, vasculitis or significant stenosis. SMA: Patent without evidence of aneurysm, dissection, vasculitis or significant stenosis. Renals: Both renal arteries are patent without evidence of aneurysm, dissection, vasculitis, fibromuscular dysplasia or significant stenosis. Accessory renal arteries noted bilaterally. IMA: Patent without evidence of aneurysm, dissection, vasculitis or significant stenosis. Inflow: Patent without evidence of aneurysm, dissection, vasculitis or significant stenosis. Proximal Outflow: Bilateral common femoral and visualized portions of the superficial and profunda femoral arteries are patent without evidence of aneurysm, dissection, vasculitis or significant stenosis. Veins: No obvious venous abnormality within the limitations of this arterial phase study. Review of the MIP images confirms the above findings.  NON-VASCULAR Lower chest: Mild bronchiectasis noted in the lower lobes bilaterally. Hepatobiliary: Multiple scattered small hepatic cysts with additional tiny hypodense lesions in the liver too small to characterize. Subtle nodular contour raises the question of cirrhosis. There is no evidence for gallstones, gallbladder wall thickening, or pericholecystic fluid. No intrahepatic or extrahepatic biliary dilation. Pancreas: 2.4 cm subtle heterogeneous hypoattenuating lesion in the uncinate process of the pancreas is similar to recent study but progressive compared to 12/17/2017 when it measured approximately 1.8 cm. The relatively slow progression suggests benign etiology. Spleen: No splenomegaly. No focal mass lesion. Adrenals/Urinary Tract: No adrenal nodule or mass. Stable exophytic large right renal cyst with additional scattered small low-density renal lesions, also most likely benign. No evidence for hydroureter. The urinary bladder appears normal for the degree of distention. Stomach/Bowel: Tiny hiatal hernia. Stomach otherwise unremarkable. No bowel dilatation. Extensive colonic diverticular change noted. No evidence for contrast extravasation into bowel on arterial or portal venous phase imaging to suggest active bleeding. Lymphatic: There is no gastrohepatic or hepatoduodenal ligament lymphadenopathy. No retroperitoneal or mesenteric lymphadenopathy. No pelvic sidewall lymphadenopathy. Reproductive: The prostate gland and seminal vesicles are unremarkable. Other: No intraperitoneal free fluid. Musculoskeletal: No worrisome lytic or sclerotic osseous abnormality. IMPRESSION: VASCULAR 1. No findings to suggest active GI bleeding.  2. Aortic atherosclerosis without aneurysm. NON-VASCULAR 1. 2.4 cm subtle hypoattenuating lesion in the uncinate process of the pancreas, slightly progressive since 2019. Likely benign. Abdominal MRI with and without contrast could be used to further evaluate as clinically warranted.  2. Subtle nodularity of hepatic contour raises the question of cirrhosis. 3. Hepatic and renal cysts. 4. Left colonic diverticulosis without findings of diverticulitis. Electronically Signed: By: Misty Stanley M.D. On: 05/11/2020 12:37    Assessment/Plan: Lower Gi bleed, likely diverticular and likely right colon based on CTA Plan to move forward with angiogram and hopefully embolization of bleeding source vessel today. Labs reviewed. CKD and has had repeated contrast loads recently. Noted risk for worsening renal function after angio today, however at this point, we have no choice but to move forward as he continues to rebleed. Reviewed procedure with pt and wife, they are agreeable to proceed. Consent obtained.    LOS: 6 days   I spent a total of 20 minutes in face to face in clinical consultation, greater than 50% of which was counseling/coordinating care for GI bleed/angio  Ascencion Dike PA-C 05/11/2020 2:39 PM

## 2020-05-11 NOTE — Interval H&P Note (Signed)
History and Physical Interval Note:  05/11/2020 8:34 AM  Thomas Joseph  has presented today for surgery, with the diagnosis of GI Bleed.  The various methods of treatment have been discussed with the patient and family. After consideration of risks, benefits and other options for treatment, the patient has consented to  Procedure(s): COLONOSCOPY WITH PROPOFOL (N/A) as a surgical intervention.  The patient's history has been reviewed, patient examined, no change in status, stable for surgery.  I have reviewed the patient's chart and labs.  Questions were answered to the patient's satisfaction.     [name]

## 2020-05-11 NOTE — Progress Notes (Signed)
4/29 note  Edited by Rhetta Mura, MD, 05/09/2020 5:22 PM    Rhetta Mura, MD  Physician  Hospitalist  Progress Notes     Signed  Date of Service:  05/09/2020 5:19 PM              Expand All Collapse All   PROGRESS NOTE   Thomas Joseph      JOI:[id number] DOB: 1933-02-28 DOA: 05/05/2020 PCP: Mila Palmer, MD      Brief Narrative:   29 black male admitted from home HTN, HLD, DM TY 2 COVID-19 positive 02/08/2019 BPH status post TURP 12/26/2014  New onset melena 05/05/2020-hemoglobin 12.7-->9.6 [facility] gastroenterology consulted on admit Patient transfused 1 unit PRBC abdomen Patient found to have azotemia GI consulted EGD no source of bleeding 4/26 Colonoscopy unrevealing 4/27 secondary to poor prep however blood found in colonoscopy therefore bleeding scan performed and mild positive IR consulted  Hospital-Problem based course Lower GI bleeding ascending colon Patient was transfused 2 units PRBC peri-procedurally on 4/27 Transfused again 2 units PRBC 4/28 as hemoglobin dropped to 5 Transfused again for hemoglobin 6.6 on 4/29 Multiphase CT abdomen pelvis shows no acute bleeding  Rest as per GI DM TY 2 Patient on prednisone for unclear reason Continue sliding scale alone Holding metformin 1000 daily AKi on admit Azotemia resolved--monitor trends--hold diuretics and ACE-- HTN At this time hold amlodipine 10 Zestoretic 1  DVT prophylaxis:  SCD  code Status: Full Family Communication: Discussed with spouse at bedside Disposition:  Status is: Inpatient  Remains inpatient appropriate because:Hemodynamically unstable, Persistent severe electrolyte disturbances and Unsafe d/c plan   Dispo: The patient is from: Home  Anticipated d/c is to: Home  Patient currently is not medically stable to d/c.              Difficult to place patient No       Consultants:   Gastroenterology  Interventional  radiology  Procedures:  Upper endoscopy 4/26 Colonoscopy 4/27 Bleeding scan 4/27  Antimicrobials: None   Subjective:  No stool since admission Hemoglobin noted to continuously be dropping however Long discussion with wife at bedside who has many questions about next steps   Objective:       Vitals:   05/09/20 0755 05/09/20 1231 05/09/20 1617 05/09/20 1645  BP: (!) 99/49 (!) 123/55 (!) 129/53 (!) 126/55  Pulse: 76 86 75 79  Resp: 18 18 18 20   Temp: 98.1 F (36.7 C)  97.7 F (36.5 C) 97.8 F (36.6 C)  TempSrc: Axillary  Oral Oral  SpO2: 98% 94% 99% 99%  Weight:      Height:        Intake/Output Summary (Last 24 hours) at 05/09/2020 1719 Last data filed at 05/09/2020 1629    Gross per 24 hour  Intake 565 ml  Output 2250 ml  Net -1685 ml       Filed Weights   05/05/20 1022 05/07/20 0831  Weight: 102 kg 102 kg    Examination:  Pleasant coherent no distress EOMI NCAT CTA B no added sound no rales no rhonchi Abdomen soft no rebound no guarding ROM intact No lower extremity edema Neurologically intact  Data Reviewed: personally reviewed   CBC Labs (Brief)          Component Value Date/Time   WBC 13.3 (H) 05/09/2020 1147   RBC 2.11 (L) 05/09/2020 1147   HGB 6.6 (LL) 05/09/2020 1147   HCT 19.5 (L) 05/09/2020 1147   PLT 111 (L) 05/09/2020 1147  MCV 92.4 05/09/2020 1147   MCH 31.3 05/09/2020 1147   MCHC 33.8 05/09/2020 1147   RDW 15.7 (H) 05/09/2020 1147   LYMPHSABS 1.2 05/05/2020 1026   MONOABS 0.6 05/05/2020 1026   EOSABS 0.1 05/05/2020 1026   BASOSABS 0.1 05/05/2020 1026     CMP Latest Ref Rng & Units 05/08/2020 05/06/2020 05/06/2020  Glucose 70 - 99 mg/dL 161(W) 960(A) 540(J)  BUN 8 - 23 mg/dL 81(X) 91(Y) 78(G)  Creatinine 0.61 - 1.24 mg/dL 9.56(O) 1.30(Q) 6.57(Q)  Sodium 135 - 145 mmol/L 140 141 141  Potassium 3.5 - 5.1 mmol/L 4.0 3.7 3.7  Chloride 98 - 111 mmol/L 112(H) 113(H) 111  CO2 22 - 32 mmol/L  20(L) 18(L) 18(L)  Calcium 8.9 - 10.3 mg/dL 7.3(L) 7.8(L) 7.5(L)  Total Protein 6.5 - 8.1 g/dL 4.2(L) - -  Total Bilirubin 0.3 - 1.2 mg/dL 0.4 - -  Alkaline Phos 38 - 126 U/L 36(L) - -  AST 15 - 41 U/L 20 - -  ALT 0 - 44 U/L 11 - -     Radiology Studies:  Imaging Results (Last 48 hours)  CT Angio Abd/Pel w/ and/or w/o  Result Date: 05/08/2020 CLINICAL DATA:  87 year old male with suspected lower GI bleed. Assess for active bleeding. EXAM: CTA ABDOMEN AND PELVIS WITHOUT AND WITH CONTRAST TECHNIQUE: Multidetector CT imaging of the abdomen and pelvis was performed using the standard protocol during bolus administration of intravenous contrast. Multiplanar reconstructed images and MIPs were obtained and reviewed to evaluate the vascular anatomy. CONTRAST:  48mL OMNIPAQUE IOHEXOL 350 MG/ML SOLN COMPARISON:  Molecular Imaging GI bleeding scan 05/07/2020; prior CT abdomen/pelvis 02/01/2018 FINDINGS: VASCULAR Aorta: Normal caliber aorta without aneurysm, dissection, vasculitis or significant stenosis. Celiac: Patent without evidence of aneurysm, dissection, vasculitis or significant stenosis. SMA: Patent without evidence of aneurysm, dissection, vasculitis or significant stenosis. Renals: 2 renal arteries bilaterally. All are patent without evidence of aneurysm, dissection, vasculitis, fibromuscular dysplasia or significant stenosis. IMA: Patent without evidence of aneurysm, dissection, vasculitis or significant stenosis. Inflow: Patent without evidence of aneurysm, dissection, vasculitis or significant stenosis. Proximal Outflow: Bilateral common femoral and visualized portions of the superficial and profunda femoral arteries are patent without evidence of aneurysm, dissection, vasculitis or significant stenosis. Veins: No focal venous abnormality. Review of the MIP images confirms the above findings. NON-VASCULAR Lower chest: No acute abnormality. The intracardiac blood pool is hypodense relative to the  adjacent myocardium consistent with anemia. Small hiatal hernia. Hepatobiliary: Gallbladder is unremarkable. No intra or extrahepatic biliary ductal dilatation. Normal hepatic contour morphology. Multiple small circumscribed low-attenuation lesions remain unchanged in size, number and character and remain consistent with small benign cysts or biliary hamartomas. Pancreas: Unremarkable. No pancreatic ductal dilatation or surrounding inflammatory changes. Spleen: Normal in size without focal abnormality. Adrenals/Urinary Tract: Normal adrenal glands. Multiple circumscribed low-attenuation renal lesions bilaterally most consistent with simple and mildly complex cysts. No significant interval change compared to prior imaging from January of 2020. The largest cyst exophytic from the right kidney measures up to 7.4 cm. Unremarkable ureters and bladder. Stomach/Bowel: Colonic diverticular disease without CT evidence of active inflammation. No evidence of contrast extravasation into the bowel lumen with particular attention paid to the right colon. No focal bowel wall thickening or evidence of obstruction. Lymphatic: No suspicious lymphadenopathy. Reproductive: Prostate is unremarkable. Other: No abdominal wall hernia or abnormality. No abdominopelvic ascites. Musculoskeletal: No acute fracture or aggressive appearing lytic or blastic osseous lesion. IMPRESSION: VASCULAR 1. No evidence of active GI bleeding at  this time. 2. No evidence of acute or clinically significant vascular abnormality. 3. Trace aortic atherosclerotic calcifications. NON-VASCULAR 1. Colonic diverticular disease without CT evidence of active inflammation. 2. The intracardiac blood pool is hypodense relative to the adjacent myocardium consistent with anemia. 3. Stable hepatic and renal cysts. Electronically Signed   By: Malachy Moan M.D.   On: 05/08/2020 15:17      Scheduled Meds: . sodium chloride   Intravenous Once  . sodium chloride    Intravenous Once  . insulin aspart  0-9 Units Subcutaneous TID WC  . latanoprost  1 drop Both Eyes QHS  . pantoprazole  40 mg Intravenous Q12H  . predniSONE  10 mg Oral Daily  . sodium chloride flush  10-40 mL Intracatheter Q12H   Continuous Infusions:   LOS: 4 days   Time spent: 34 minutes including care coordination time with GI and interventional radiology  Rhetta Mura, MD [facility] To contact the attending provider between 7A-7P or the covering provider during after hours 7P-7A, please log into the web site [email or web address] and access using universal [place] password for that web site. If you do not have the password, please call the hospital operator.  05/09/2020, 5:19 PM

## 2020-05-11 NOTE — Anesthesia Postprocedure Evaluation (Signed)
Anesthesia Post Note  Patient: Thomas Joseph  Procedure(s) Performed: COLONOSCOPY WITH PROPOFOL (N/A )     Patient location during evaluation: PACU Anesthesia Type: MAC Level of consciousness: awake and alert Pain management: pain level controlled Vital Signs Assessment: post-procedure vital signs reviewed and stable Respiratory status: spontaneous breathing, nonlabored ventilation, respiratory function stable and patient connected to nasal cannula oxygen Cardiovascular status: stable and blood pressure returned to baseline Postop Assessment: no apparent nausea or vomiting Anesthetic complications: no Comments: Transfused 2U PRBC's   No complications documented.  Last Vitals:  Vitals:   05/11/20 1046 05/11/20 1055  BP: (!) 154/54   Pulse: 79 67  Resp: 18 15  Temp:    SpO2: 96% 94%    Last Pain:  Vitals:   05/11/20 1046  TempSrc:   PainSc: 0-No pain                 Effie Berkshire

## 2020-05-11 NOTE — Progress Notes (Addendum)
VASCULAR LAB    Bilateral lower extremity venous duplex has been performed.  See CV proc for preliminary results.  Gave report to [facility], RN  Eisley Barber, RVT 05/11/2020, 6:59 PM

## 2020-05-11 NOTE — Transfer of Care (Signed)
Immediate Anesthesia Transfer of Care Note  Patient: Thomas Joseph  Procedure(s) Performed: COLONOSCOPY WITH PROPOFOL (N/A )  Patient Location: PACU and Endoscopy Unit  Anesthesia Type:MAC  Level of Consciousness: awake, alert  and oriented  Airway & Oxygen Therapy: Patient Spontanous Breathing and Patient connected to nasal cannula oxygen  Post-op Assessment: Report given to RN and Post -op Vital signs reviewed and stable  Post vital signs: Reviewed and stable  Last Vitals:  Vitals Value Taken Time  BP    Temp    Pulse 69 05/11/20 1000  Resp 23 05/11/20 1000  SpO2 100 % 05/11/20 1000  Vitals shown include unvalidated device data.  Last Pain:  Vitals:   05/11/20 0820  TempSrc: Oral  PainSc: 0-No pain      Patients Stated Pain Goal: 3 (11/05/83 2778)  Complications: No complications documented.

## 2020-05-11 NOTE — Progress Notes (Signed)
Patient received one unit of PRBC at Endo room and 2nd unit of PRBC hung by Endo nurse. Patient needed to do CT of Abd/ pel stat. Charge nurse went down to CT room with patient and came back from the test around 1200. Patient might going to IR for embolization. V/S is stable and patient is NPO status and CBG: 151 without insulin coverage. Patient is resting on the bed now. HS [employer]

## 2020-05-11 NOTE — Anesthesia Preprocedure Evaluation (Addendum)
Anesthesia Evaluation  Patient identified by MRN, date of birth, ID band Patient awake    Reviewed: Allergy & Precautions, NPO status , Patient's Chart, lab work & pertinent test results  Airway Mallampati: I  TM Distance: >3 FB Neck ROM: Full    Dental  (+) Edentulous Upper, Edentulous Lower   Pulmonary former smoker,    breath sounds clear to auscultation       Cardiovascular hypertension, Pt. on medications + Peripheral Vascular Disease  + dysrhythmias Atrial Fibrillation  Rhythm:Regular Rate:Normal     Neuro/Psych  Neuromuscular disease negative psych ROS   GI/Hepatic negative GI ROS, Neg liver ROS,   Endo/Other  diabetes, Type 2, Oral Hypoglycemic AgentsHypothyroidism   Renal/GU Renal disease     Musculoskeletal  (+) Arthritis ,   Abdominal Normal abdominal exam  (+)   Peds  Hematology   Anesthesia Other Findings   Reproductive/Obstetrics                            Anesthesia Physical Anesthesia Plan  ASA: III  Anesthesia Plan: MAC   Post-op Pain Management:    Induction: Intravenous  PONV Risk Score and Plan: 0 and Propofol infusion  Airway Management Planned: Natural Airway and Simple Face Mask  Additional Equipment: None  Intra-op Plan:   Post-operative Plan:   Informed Consent: I have reviewed the patients History and Physical, chart, labs and discussed the procedure including the risks, benefits and alternatives for the proposed anesthesia with the patient or authorized representative who has indicated his/her understanding and acceptance.       Plan Discussed with: CRNA  Anesthesia Plan Comments: (EKG: atrial fibrillation. )       Anesthesia Quick Evaluation

## 2020-05-11 NOTE — Progress Notes (Signed)
PROGRESS NOTE   Thomas Joseph  HYW:[id number] DOB: 1933/06/17 DOA: 05/05/2020 PCP: Mila Palmer, MD  Brief Narrative:   82 black male admitted from home HTN, HLD, DM TY 2 COVID-19 positive 02/08/2019 BPH status post TURP 12/26/2014  New onset melena 05/05/2020-hemoglobin 12.7-->9.6 [facility] gastroenterology consulted on admit Patient transfused 1 unit PRBC Patient found to have azotemia GI consulted EGD no source of bleeding 4/26 Colonoscopy unrevealing 4/27 secondary to poor prep however blood found in colonoscopy therefore bleeding scan performed and mild positive IR consulted--nuclear bleeding scan performed showing bleeding ascending colon Multiphase CT subsequently did not reveal a source  Patient has been transfused about 6 to 7 units of PRBC He however has not had a stool despite all of this Further ongoing discussions with GI and IR regarding next steps-probable colonoscopy 5 1 versus 05/12/2020 depending on prep  Hospital-Problem based course Lower GI bleeding ascending colon Embolization right colic artery 5/1 Dr. Lowella Dandy Hemodynamics have remained stable despite the need for multiple units of blood transfusions 7 units PRBC transfused till date Multiphase CT abdomen pelvis did not confirm bleeding Colonoscopy 5/1 = blood in colon Status post coil embolization right colic artery Dr. Lowella Dandy Diet as per GI ?  Lower extremity DVT-small pulmonary embolism on imaging Not a candidate for anticoagulation given massive bleed-subrenal artery IVC filter placed 5/1 Lower extremity duplex ordered Hematoma left arm at midline site Extravasation as well as hematoma on left arm--midline removed PICC line to be placed on opposite side for blood infusion Leukocytosis Etiology unclear-no fever-monitor for fever curve/signs vascular compromise/sepsis DM TY 2 Taper prednisone 2.5X 3 to 4 days then stop Continue sliding scale alone Resume diet post all procedures as per GI Holding  metformin 1000 daily AKi on admit Mild metabolic acidosis probably from reequilibrated Azotemia resolved--monitor trends--hold diuretics and ACE HTN At this time hold amlodipine 10 Zestoretic 1  DVT prophylaxis:  SCD  code Status: Full Family Communication: No family present today Disposition:  Status is: Inpatient  Remains inpatient appropriate because:Ongoing diagnostic testing needed not appropriate for outpatient work up and Unsafe d/c plan   Dispo: The patient is from: Home              Anticipated d/c is to: Home              Patient currently is not medically stable to d/c.   Difficult to place patient No       Consultants:   Gastroenterology  Interventional radiology  Procedures:  Upper endoscopy 4/26 Colonoscopy 4/27 Bleeding scan 4/27 Multiphase CT 4/28 showing no bleeding Mesenteric arteriogram/IVC filter 5/1 1) IVC filter placed below renal veins 2) Active bleeding from right colic artery branch.  Successful coil embolization of feeding branch to bleeding site. 3) Right groin closure with Angioseal device.  Antimicrobials: None   Subjective:  Seen briefly this morning prior to colonoscopy Stable conversant  Objective: Vitals:   05/11/20 1605 05/11/20 1610 05/11/20 1615 05/11/20 1630  BP: (!) 162/26 (!) 168/45 (!) 143/29 (!) 145/41  Pulse:  80 75 75  Resp: (!) 78 17 19 19   Temp:      TempSrc:      SpO2: 100% 100% 100% 98%  Weight:      Height:        Intake/Output Summary (Last 24 hours) at 05/11/2020 1638 Last data filed at 05/11/2020 1300 Gross per 24 hour  Intake 2159.33 ml  Output 250 ml  Net 1909.33 ml   07/11/2020  05/05/20 1022 05/07/20 0831 05/11/20 0820  Weight: 102 kg 102 kg 102 kg    Examination:  Awake coherent x4 EOMI NCAT no focal deficit Chest clinically clear Abdomen soft Data Reviewed: personally reviewed   CBC    Component Value Date/Time   WBC 18.3 (H) 05/11/2020 0545   RBC 2.35 (L) 05/11/2020 0545    HGB 7.0 (L) 05/11/2020 0545   HCT 21.1 (L) 05/11/2020 0545   PLT 111 (L) 05/11/2020 0545   MCV 89.8 05/11/2020 0545   MCH 29.8 05/11/2020 0545   MCHC 33.2 05/11/2020 0545   RDW 17.4 (H) 05/11/2020 0545   LYMPHSABS 1.3 05/10/2020 0112   MONOABS 1.1 (H) 05/10/2020 0112   EOSABS 0.1 05/10/2020 0112   BASOSABS 0.0 05/10/2020 0112   CMP Latest Ref Rng & Units 05/11/2020 05/10/2020 05/08/2020  Glucose 70 - 99 mg/dL 277(A) 128(N) 867(E)  BUN 8 - 23 mg/dL 72(C) 94(B) 09(G)  Creatinine 0.61 - 1.24 mg/dL 2.83(M) 6.29(U) 7.65(Y)  Sodium 135 - 145 mmol/L 138 140 140  Potassium 3.5 - 5.1 mmol/L 3.6 4.1 4.0  Chloride 98 - 111 mmol/L 109 111 112(H)  CO2 22 - 32 mmol/L 21(L) 20(L) 20(L)  Calcium 8.9 - 10.3 mg/dL 7.2(L) 7.6(L) 7.3(L)  Total Protein 6.5 - 8.1 g/dL 3.9(L) - 4.2(L)  Total Bilirubin 0.3 - 1.2 mg/dL 0.5 - 0.4  Alkaline Phos 38 - 126 U/L 38 - 36(L)  AST 15 - 41 U/L 20 - 20  ALT 0 - 44 U/L 13 - 11     Radiology Studies: [place] EKG SITE RITE  Result Date: 05/10/2020 If Site Rite image not attached, placement could not be confirmed due to current cardiac rhythm.  CT Angio Abd/Pel w/ and/or w/o  Addendum Date: 05/11/2020   ADDENDUM REPORT: 05/11/2020 15:00 ADDENDUM: Patient is also noted to have a new filling defect involving a subsegmental right lower lobe pulmonary artery (image 14/series 5) consistent with acute pulmonary embolus. Electronically Signed   By: Kennith Center M.D.   On: 05/11/2020 15:00   Addendum Date: 05/11/2020   ADDENDUM REPORT: 05/11/2020 13:42 ADDENDUM: After speaking with Dr. Lowella Dandy, there is a focus of contrast extravasation in the ascending colon near the hepatic flexure associated with a diverticulum. This is compatible with active extravasation in the right colon. Electronically Signed   By: Kennith Center M.D.   On: 05/11/2020 13:42   Result Date: 05/11/2020 CLINICAL DATA:  GI bleed. EXAM: CTA ABDOMEN AND PELVIS WITHOUT AND WITH CONTRAST TECHNIQUE: Multidetector CT imaging  of the abdomen and pelvis was performed using the standard protocol during bolus administration of intravenous contrast. Multiplanar reconstructed images and MIPs were obtained and reviewed to evaluate the vascular anatomy. CONTRAST:  60mL OMNIPAQUE IOHEXOL 300 MG/ML  SOLN COMPARISON:  05/08/2020 FINDINGS: VASCULAR Aorta: Normal caliber aorta without aneurysm, dissection, vasculitis or significant stenosis. Celiac: Patent without evidence of aneurysm, dissection, vasculitis or significant stenosis. SMA: Patent without evidence of aneurysm, dissection, vasculitis or significant stenosis. Renals: Both renal arteries are patent without evidence of aneurysm, dissection, vasculitis, fibromuscular dysplasia or significant stenosis. Accessory renal arteries noted bilaterally. IMA: Patent without evidence of aneurysm, dissection, vasculitis or significant stenosis. Inflow: Patent without evidence of aneurysm, dissection, vasculitis or significant stenosis. Proximal Outflow: Bilateral common femoral and visualized portions of the superficial and profunda femoral arteries are patent without evidence of aneurysm, dissection, vasculitis or significant stenosis. Veins: No obvious venous abnormality within the limitations of this arterial phase study. Review of the MIP images  confirms the above findings. NON-VASCULAR Lower chest: Mild bronchiectasis noted in the lower lobes bilaterally. Hepatobiliary: Multiple scattered small hepatic cysts with additional tiny hypodense lesions in the liver too small to characterize. Subtle nodular contour raises the question of cirrhosis. There is no evidence for gallstones, gallbladder wall thickening, or pericholecystic fluid. No intrahepatic or extrahepatic biliary dilation. Pancreas: 2.4 cm subtle heterogeneous hypoattenuating lesion in the uncinate process of the pancreas is similar to recent study but progressive compared to 12/17/2017 when it measured approximately 1.8 cm. The relatively  slow progression suggests benign etiology. Spleen: No splenomegaly. No focal mass lesion. Adrenals/Urinary Tract: No adrenal nodule or mass. Stable exophytic large right renal cyst with additional scattered small low-density renal lesions, also most likely benign. No evidence for hydroureter. The urinary bladder appears normal for the degree of distention. Stomach/Bowel: Tiny hiatal hernia. Stomach otherwise unremarkable. No bowel dilatation. Extensive colonic diverticular change noted. No evidence for contrast extravasation into bowel on arterial or portal venous phase imaging to suggest active bleeding. Lymphatic: There is no gastrohepatic or hepatoduodenal ligament lymphadenopathy. No retroperitoneal or mesenteric lymphadenopathy. No pelvic sidewall lymphadenopathy. Reproductive: The prostate gland and seminal vesicles are unremarkable. Other: No intraperitoneal free fluid. Musculoskeletal: No worrisome lytic or sclerotic osseous abnormality. IMPRESSION: VASCULAR 1. No findings to suggest active GI bleeding. 2. Aortic atherosclerosis without aneurysm. NON-VASCULAR 1. 2.4 cm subtle hypoattenuating lesion in the uncinate process of the pancreas, slightly progressive since 2019. Likely benign. Abdominal MRI with and without contrast could be used to further evaluate as clinically warranted. 2. Subtle nodularity of hepatic contour raises the question of cirrhosis. 3. Hepatic and renal cysts. 4. Left colonic diverticulosis without findings of diverticulitis. Electronically Signed: By: Kennith Center M.D. On: 05/11/2020 12:37     Scheduled Meds: . sodium chloride   Intravenous Once  . sodium chloride   Intravenous Once  . sodium chloride   Intravenous Once  . sodium chloride   Intravenous Once  . sodium chloride   Intravenous Once  . Chlorhexidine Gluconate Cloth  6 each Topical Daily  . fentaNYL      . gelatin adsorbable      . insulin aspart  0-9 Units Subcutaneous TID WC  . latanoprost  1 drop Both Eyes  QHS  . lidocaine      . midazolam      . pantoprazole  40 mg Intravenous Q12H  . predniSONE  2.5 mg Oral Daily  . sodium chloride flush  10-40 mL Intracatheter Q12H  . sodium chloride flush  10-40 mL Intracatheter Q12H   Continuous Infusions: . sodium chloride    . lactated ringers 20 mL/hr at 05/11/20 0845     LOS: 6 days   Time spent: 40  Rhetta Mura, MD [facility] To contact the attending provider between 7A-7P or the covering provider during after hours 7P-7A, please log into the web site [email or web address] and access using universal [place] password for that web site. If you do not have the password, please call the hospital operator.  05/11/2020, 4:38 PM

## 2020-05-11 NOTE — Progress Notes (Signed)
PT Cancellation Note  Patient Details Name: Thomas Joseph MRN: [id number] DOB: Jul 04, 1933   Cancelled Treatment:    Reason Eval/Treat Not Completed: Patient at procedure or test/unavailable; patient down for colonoscopy.  Will attempt another day.   Elray Mcgregor 05/11/2020, 9:01 AM  Sheran Lawless, PT Acute [facility] Services Pager:[phone] Office:[phone] 05/11/2020

## 2020-05-11 NOTE — Brief Op Note (Addendum)
  Large amount of dark red and maroon blood and clots throughout the colon likely due to a diverticular source. Could not pinpoint source of the bleeding. Diverticulosis seen throughout the colon. Discussed with Dr. Lowella Dandy from IR and CT angiogram planned and if that is positive then he needs an angiogram and embolization. 2 U PRBCs transfusing. Will follow. Updated Dr. Mahala Menghini. Updated patient's wife by phone.

## 2020-05-11 NOTE — Op Note (Signed)
[facility] Name: Thomas DiceRobert Joseph Procedure Date : 05/11/2020 MRN: [id number] Attending MD: Shirley FriarVincent C Fleet Higham , MD Date of Birth: 12-22-1933 CSN: [id number] Age: [age] Admit Type: Inpatient Procedure:                Colonoscopy Indications:              Evaluation of unexplained GI bleeding presenting                            with Hematochezia, Last colonoscopy one week ago Providers:                Shirley FriarVincent C. Azie Mcconahy, MD, Rogue JurySandy Andrews, RN, Arlee Muslimhris                            Chandler Tech., Technician, Wendie AgresteJamie Golob,CRNA Referring MD:             hospital team Medicines:                Propofol per Anesthesia, Monitored Anesthesia Care Complications:            No immediate complications. Estimated Blood Loss:     see above (no bleeding from the colonoscopy) Procedure:                Pre-Anesthesia Assessment:                           - Prior to the procedure, a History and Physical                            was performed, and patient medications and                            allergies were reviewed. The patient's tolerance of                            previous anesthesia was also reviewed. The risks                            and benefits of the procedure and the sedation                            options and risks were discussed with the patient.                            All questions were answered, and informed consent                            was obtained. Prior Anticoagulants: The patient has                            taken no previous anticoagulant or antiplatelet                            agents. ASA Grade Assessment: III - A patient with  severe systemic disease. After reviewing the risks                            and benefits, the patient was deemed in                            satisfactory condition to undergo the procedure.                           After obtaining informed consent, the colonoscope                             was passed under direct vision. Throughout the                            procedure, the patient's blood pressure, pulse, and                            oxygen saturations were monitored continuously. The                            PCF-H190DL ([id number]) Olympus pediatric colonscope                            was introduced through the anus and advanced to the                            the cecum, identified by appendiceal orifice and                            ileocecal valve. The colonoscopy was performed with                            difficulty due to excessive bleeding, significant                            looping and a tortuous colon. Successful completion                            of the procedure was aided by straightening and                            shortening the scope to obtain bowel loop                            reduction, applying abdominal pressure and lavage.                            The patient tolerated the procedure well. The                            quality of the bowel preparation was  unsatisfactory. The ileocecal valve, appendiceal                            orifice, and rectum were photographed. Scope In: 9:00:19 AM Scope Out: 9:36:00 AM Scope Withdrawal Time: 0 hours 19 minutes 15 seconds  Total Procedure Duration: 0 hours 35 minutes 41 seconds  Findings:      The perianal and digital rectal examinations were normal.      Red blood was found in the entire colon.      Multiple small and large-mouthed diverticula were found in the entire       colon.      Copious amount of dark red and marroon blood found throughout the colon       along with scattered areas of dark red blood clots and repeated       irrigation in the colon prevented visualization of the source. Suspect a       diverticular source but could not pinpoint the diverticulae that is       bleeding.      Internal hemorrhoids were found during retroflexion. The  hemorrhoids       were small and Grade I (internal hemorrhoids that do not prolapse). Impression:               - Preparation of the colon was unsatisfactory.                           - Blood in the entire examined colon.                           - Diverticulosis in the entire examined colon.                           - Internal hemorrhoids.                           - No specimens collected. Recommendation:           - NPO.                           - CT angiogram and if positive then needs angiogram                            with embolization of the bleeding source. Discussed                            with IR physician. Procedure Code(s):        --- Professional ---                           [phone], Colonoscopy, flexible; diagnostic, including                            collection of specimen(s) by brushing or washing,                            when performed (separate procedure) Diagnosis Code(s):        --- Professional ---  K92.2, Gastrointestinal hemorrhage, unspecified                           K92.1, Melena (includes Hematochezia)                           K64.0, First degree hemorrhoids                           K57.30, Diverticulosis of large intestine without                            perforation or abscess without bleeding CPT copyright 2019 [employer]. All rights reserved. The codes documented in this report are preliminary and upon coder review may  be revised to meet current compliance requirements. [name], MD 05/11/2020 9:51:51 AM This report has been signed electronically. Number of Addenda: 0

## 2020-05-11 NOTE — Anesthesia Procedure Notes (Signed)
Procedure Name: MAC Date/Time: 05/11/2020 8:48 AM Performed by: Trinna Post., CRNA Pre-anesthesia Checklist: Patient identified, Emergency Drugs available, Suction available, Patient being monitored and Timeout performed Patient Re-evaluated:Patient Re-evaluated prior to induction Oxygen Delivery Method: Nasal cannula Preoxygenation: Pre-oxygenation with 100% oxygen Placement Confirmation: positive ETCO2

## 2020-05-11 NOTE — Plan of Care (Signed)

## 2020-05-11 NOTE — Progress Notes (Signed)
  Echocardiogram 2D Echocardiogram has been performed.  Gerda Diss 05/11/2020, 5:48 PM

## 2020-05-11 NOTE — Progress Notes (Signed)
PROGRESS NOTE Note for 4/30--overwrote 4/29 note in error  Thomas Joseph  GLO:[id number] DOB: 1933-05-11 DOA: 05/05/2020 PCP: Mila Palmer, MD  Brief Narrative:   45 black male admitted from home HTN, HLD, DM TY 2 COVID-19 positive 02/08/2019 BPH status post TURP 12/26/2014  New onset melena 05/05/2020-hemoglobin 12.7-->9.6 [facility] gastroenterology consulted on admit Patient transfused 1 unit PRBC Patient found to have azotemia GI consulted EGD no source of bleeding 4/26 Colonoscopy unrevealing 4/27 secondary to poor prep however blood found in colonoscopy therefore bleeding scan performed and mild positive IR consulted--nuclear bleeding scan performed showing bleeding ascending colon Multiphase CT subsequently did not reveal a source  Patient has been transfused about 6 to 7 units of PRBC He however has not had a stool despite all of this Further ongoing discussions with GI and IR regarding next steps-probable colonoscopy 5 1 versus 05/12/2020 depending on prep  Hospital-Problem based course Lower GI bleeding ascending colon Hemodynamics have remained stable despite the need for multiple units of blood transfusions Patient was transfused 2 units PRBC peri-procedurally on 4/27 Transfused again 2 units PRBC 4/28 as hemoglobin dropped to 5 Transfused again for hemoglobin 6.6 on 4/29 Transfused  4/30 a.m. 2 units PRBC Multiphase CT abdomen pelvis shows no acute bleeding  Discussed with Dr. Georgiann Cocker 4/29 need for intervention-probably will need prep over the weekend and more urgent colonoscopy subsequently--appreciate Dr. Marge Duncans input today Hematoma left arm at midline site Extravasation as well as hematoma on left arm Remove line, elevate arm place ice PICC line to be placed on opposite side for blood infusion DM TY 2 We will taper and discontinue prednisone given underlying risk of bleeding secondary to this Continue sliding scale alone Holding metformin 1000 daily AKi on  admit Mild metabolic acidosis probably from reequilibrated Azotemia resolved--monitor trends--hold diuretics and ACE Will consider getting lactic acid after transfusion HTN At this time hold amlodipine 10 Zestoretic 1  DVT prophylaxis:  SCD  code Status: Full Family Communication: Discussed with spouse at bedside on multiple days  Disposition:  Status is: Inpatient  Remains inpatient appropriate because:Ongoing diagnostic testing needed not appropriate for outpatient work up and Unsafe d/c plan   Dispo: The patient is from: Home              Anticipated d/c is to: Home              Patient currently is not medically stable to d/c.   Difficult to place patient No       Consultants:   Gastroenterology  Interventional radiology  Procedures:  Upper endoscopy 4/26 Colonoscopy 4/27 Bleeding scan 4/27 Multiphase CT 4/28 showing no bleeding  Antimicrobials: None   Subjective:  Events overnight noted and need for blood noted again Left arm is very swollen and tight from malfunction of midline Otherwise patient is doing okay-he is not dizzy-he is passing flatus but no stool yet No chest pain no fever   Objective: Vitals:   05/10/20 2256 05/10/20 2313 05/11/20 0532 05/11/20 0714  BP: 95/80 113/79 (!) 154/60 (!) 146/54  Pulse: 90 (!) 105 75 72  Resp: 15 18 16 18   Temp: 98 F (36.7 C)  (!) 97.4 F (36.3 C) 98.2 F (36.8 C)  TempSrc: Oral  Oral Oral  SpO2: 95% 95% 98% 96%  Weight:      Height:        Intake/Output Summary (Last 24 hours) at 05/11/2020 0729 Last data filed at 05/11/2020 0714 Gross per 24 hour  Intake 1579.33 ml  Output 500 ml  Net 1079.33 ml   Filed Weights   05/05/20 1022 05/07/20 0831  Weight: 102 kg 102 kg    Examination:  Awake alert coherent somewhat sleepy no distress EOMI NCAT no focal deficit Chest clinically clear no added sound no rales no rhonchi Abdomen soft, slightly tender in left lower quadrant Neurologically intact no  focal deficit  Data Reviewed: personally reviewed   CBC    Component Value Date/Time   WBC 18.3 (H) 05/11/2020 0545   RBC 2.35 (L) 05/11/2020 0545   HGB 7.0 (L) 05/11/2020 0545   HCT 21.1 (L) 05/11/2020 0545   PLT 111 (L) 05/11/2020 0545   MCV 89.8 05/11/2020 0545   MCH 29.8 05/11/2020 0545   MCHC 33.2 05/11/2020 0545   RDW 17.4 (H) 05/11/2020 0545   LYMPHSABS 1.3 05/10/2020 0112   MONOABS 1.1 (H) 05/10/2020 0112   EOSABS 0.1 05/10/2020 0112   BASOSABS 0.0 05/10/2020 0112   CMP Latest Ref Rng & Units 05/11/2020 05/10/2020 05/08/2020  Glucose 70 - 99 mg/dL 700(F) 749(S) 496(P)  BUN 8 - 23 mg/dL 59(F) 63(W) 46(K)  Creatinine 0.61 - 1.24 mg/dL 5.99(J) 5.70(V) 7.79(T)  Sodium 135 - 145 mmol/L 138 140 140  Potassium 3.5 - 5.1 mmol/L 3.6 4.1 4.0  Chloride 98 - 111 mmol/L 109 111 112(H)  CO2 22 - 32 mmol/L 21(L) 20(L) 20(L)  Calcium 8.9 - 10.3 mg/dL 7.2(L) 7.6(L) 7.3(L)  Total Protein 6.5 - 8.1 g/dL 3.9(L) - 4.2(L)  Total Bilirubin 0.3 - 1.2 mg/dL 0.5 - 0.4  Alkaline Phos 38 - 126 U/L 38 - 36(L)  AST 15 - 41 U/L 20 - 20  ALT 0 - 44 U/L 13 - 11     Radiology Studies: [place] EKG SITE RITE  Result Date: 05/10/2020 If Site Rite image not attached, placement could not be confirmed due to current cardiac rhythm.    Scheduled Meds: . sodium chloride   Intravenous Once  . sodium chloride   Intravenous Once  . sodium chloride   Intravenous Once  . sodium chloride   Intravenous Once  . Chlorhexidine Gluconate Cloth  6 each Topical Daily  . insulin aspart  0-9 Units Subcutaneous TID WC  . latanoprost  1 drop Both Eyes QHS  . pantoprazole  40 mg Intravenous Q12H  . predniSONE  2.5 mg Oral Daily  . sodium chloride flush  10-40 mL Intracatheter Q12H  . sodium chloride flush  10-40 mL Intracatheter Q12H   Continuous Infusions: . sodium chloride       LOS: 6 days   Time spent: 40  Rhetta Mura, MD [facility] To contact the attending provider between 7A-7P or the  covering provider during after hours 7P-7A, please log into the web site [email or web address] and access using universal [place] password for that web site. If you do not have the password, please call the hospital operator.  05/11/2020, 7:29 AM

## 2020-05-11 NOTE — Procedures (Signed)
Interventional Radiology Procedure:   Indications: GI bleed.  Bleeding from right colon on recent CTA.  Pulmonary embolism on recent CTA  Procedure: 1) IVC filter placement 2) Mesenteric arteriogram with coil embolization of right colic artery branch  Findings: 1) IVC filter placed below renal veins 2) Active bleeding from right colic artery branch.  Successful coil embolization of feeding branch to bleeding site. 3) Right groin closure with Angioseal device.  Complications: None     EBL: less than 15 ml  Plan: Bedrest 3 hours.  Follow CBC.     Thomas Joseph R. Lowella Dandy, MD  Pager: [phone]

## 2020-05-12 ENCOUNTER — Encounter (HOSPITAL_COMMUNITY): Payer: Self-pay | Admitting: Gastroenterology

## 2020-05-12 DIAGNOSIS — K922 Gastrointestinal hemorrhage, unspecified: Secondary | ICD-10-CM | POA: Diagnosis not present

## 2020-05-12 LAB — CBC
HCT: 21.9 % — ABNORMAL LOW (ref 39.0–52.0)
Hemoglobin: 7.4 g/dL — ABNORMAL LOW (ref 13.0–17.0)
MCH: 29.8 pg (ref 26.0–34.0)
MCHC: 33.8 g/dL (ref 30.0–36.0)
MCV: 88.3 fL (ref 80.0–100.0)
Platelets: 86 10*3/uL — ABNORMAL LOW (ref 150–400)
RBC: 2.48 MIL/uL — ABNORMAL LOW (ref 4.22–5.81)
RDW: 16.2 % — ABNORMAL HIGH (ref 11.5–15.5)
WBC: 12.8 10*3/uL — ABNORMAL HIGH (ref 4.0–10.5)
nRBC: 3.3 % — ABNORMAL HIGH (ref 0.0–0.2)

## 2020-05-12 LAB — TYPE AND SCREEN
ABO/RH(D): A POS
Antibody Screen: NEGATIVE
Unit division: 0
Unit division: 0
Unit division: 0
Unit division: 0
Unit division: 0
Unit division: 0
Unit division: 0
Unit division: 0

## 2020-05-12 LAB — BPAM RBC
Blood Product Expiration Date: 202205242359
Blood Product Expiration Date: 202205242359
Blood Product Expiration Date: 202205242359
Blood Product Expiration Date: 202205252359
Blood Product Expiration Date: 202205252359
Blood Product Expiration Date: 202205252359
Blood Product Expiration Date: 202205252359
Blood Product Expiration Date: 202205272359
ISSUE DATE / TIME: 202204291612
ISSUE DATE / TIME: 202204291930
ISSUE DATE / TIME: 202204300321
ISSUE DATE / TIME: 202204301103
ISSUE DATE / TIME: 202204301840
ISSUE DATE / TIME: 202205010915
ISSUE DATE / TIME: 202205010915
ISSUE DATE / TIME: 202205012018
Unit Type and Rh: 6200
Unit Type and Rh: 6200
Unit Type and Rh: 6200
Unit Type and Rh: 6200
Unit Type and Rh: 6200
Unit Type and Rh: 6200
Unit Type and Rh: 6200
Unit Type and Rh: 6200

## 2020-05-12 LAB — COMPREHENSIVE METABOLIC PANEL
ALT: 12 U/L (ref 0–44)
AST: 16 U/L (ref 15–41)
Albumin: 2.1 g/dL — ABNORMAL LOW (ref 3.5–5.0)
Alkaline Phosphatase: 33 U/L — ABNORMAL LOW (ref 38–126)
Anion gap: 5 (ref 5–15)
BUN: 38 mg/dL — ABNORMAL HIGH (ref 8–23)
CO2: 23 mmol/L (ref 22–32)
Calcium: 7.1 mg/dL — ABNORMAL LOW (ref 8.9–10.3)
Chloride: 109 mmol/L (ref 98–111)
Creatinine, Ser: 1.75 mg/dL — ABNORMAL HIGH (ref 0.61–1.24)
GFR, Estimated: 38 mL/min — ABNORMAL LOW (ref >60)
Glucose, Bld: 191 mg/dL — ABNORMAL HIGH (ref 70–99)
Potassium: 3.7 mmol/L (ref 3.5–5.1)
Sodium: 137 mmol/L (ref 135–145)
Total Bilirubin: 0.7 mg/dL (ref 0.3–1.2)
Total Protein: 3.4 g/dL — ABNORMAL LOW (ref 6.5–8.1)

## 2020-05-12 LAB — GLUCOSE, CAPILLARY
Glucose-Capillary: 172 mg/dL — ABNORMAL HIGH (ref 70–99)
Glucose-Capillary: 214 mg/dL — ABNORMAL HIGH (ref 70–99)
Glucose-Capillary: 197 mg/dL — ABNORMAL HIGH (ref 70–99)
Glucose-Capillary: 208 mg/dL — ABNORMAL HIGH (ref 70–99)

## 2020-05-12 LAB — PREPARE RBC (CROSSMATCH)

## 2020-05-12 NOTE — Progress Notes (Signed)
Physical Therapy Treatment Patient Details Name: Thomas Joseph MRN: [id number] DOB: 1933/06/15 Today's Date: 05/12/2020    History of Present Illness Pt is an 87 y/o male presenting with new onset of melena.  Pt s/p upper GI 4/26 with no source found.  Colonoscopy 4/27 unrevealing do to poor prep. Bleeding scan showed bleeding in ascending colon. Pt also found to have DVT and PE. On 5/1 pt underwent coil embolization of right colic artery branch and IVC filter placement. PMHx:  CKD, DM, HTN, L TKA    PT Comments    Pt with multiple medical procedures over the weekend. Remains weak but did not get nauseous or dizzy with mobility today. Expect he will continue to make good progress.    Follow Up Recommendations  Outpatient PT     Equipment Recommendations  Rolling walker with 5" wheels    Recommendations for Other Services       Precautions / Restrictions Precautions Precautions: Fall    Mobility  Bed Mobility Overal bed mobility: Needs Assistance Bed Mobility: Supine to Sit     Supine to sit: Min assist     General bed mobility comments: Assist to elevate trunk into sitting    Transfers Overall transfer level: Needs assistance Equipment used: Rolling walker (2 wheeled) Transfers: Sit to/from Stand Sit to Stand: Min assist         General transfer comment: Assist to bring hips up  Ambulation/Gait Ambulation/Gait assistance: Min assist Gait Distance (Feet): 15 Feet Assistive device: Rolling walker (2 wheeled) Gait Pattern/deviations: Step-through pattern;Decreased step length - left;Decreased step length - right;Shuffle;Trunk flexed Gait velocity: decr Gait velocity interpretation: <1.31 ft/sec, indicative of household ambulator General Gait Details: Assist for balance. Pt with very short, tentative steps   Stairs             Wheelchair Mobility    Modified Rankin (Stroke Patients Only)       Balance Overall balance assessment: Needs  assistance Sitting-balance support: No upper extremity supported;Feet supported Sitting balance-Leahy Scale: Fair     Standing balance support: Bilateral upper extremity supported;During functional activity Standing balance-Leahy Scale: Poor Standing balance comment: walker and min guard for static standing                            Cognition Arousal/Alertness: Awake/alert Behavior During Therapy: WFL for tasks assessed/performed Overall Cognitive Status: Within Functional Limits for tasks assessed                                        Exercises      General Comments General comments (skin integrity, edema, etc.): VSS on RA      Pertinent Vitals/Pain Pain Assessment: Faces Faces Pain Scale: No hurt    Home Living                      Prior Function            PT Goals (current goals can now be found in the care plan section) Acute Rehab PT Goals Patient Stated Goal: return to independence Progress towards PT goals: Progressing toward goals    Frequency    Min 3X/week      PT Plan Discharge plan needs to be updated    Co-evaluation  AM-PAC PT "6 Clicks" Mobility   Outcome Measure  Help needed turning from your back to your side while in a flat bed without using bedrails?: A Little Help needed moving from lying on your back to sitting on the side of a flat bed without using bedrails?: A Little Help needed moving to and from a bed to a chair (including a wheelchair)?: A Little Help needed standing up from a chair using your arms (e.g., wheelchair or bedside chair)?: A Little Help needed to walk in hospital room?: A Little Help needed climbing 3-5 steps with a railing? : A Lot 6 Click Score: 17    End of Session   Activity Tolerance: Patient limited by fatigue;Patient tolerated treatment well Patient left: with call bell/phone within reach;with family/visitor present;in chair;with chair alarm  set Nurse Communication: Mobility status PT Visit Diagnosis: Other abnormalities of gait and mobility (R26.89);Difficulty in walking, not elsewhere classified (R26.2)     Time: 0802-2336 PT Time Calculation (min) (ACUTE ONLY): 22 min  Charges:  $Gait Training: 8-22 mins                     [facility] PT Acute Rehabilitation Services Pager [phone] Office [phone]    Angelina Ok [facility] 05/12/2020, 6:24 PM

## 2020-05-12 NOTE — Progress Notes (Signed)
Referring Physician(s): * No referring provider recorded for this case *  Supervising Physician: Marliss Coots  Patient Status:  [facility] - In-pt  Chief Complaint: GI bleed  Subjective: Sitting up in bed.  Appears weak.  States he gets dizzy when standing, otherwise feeling better.  No further bloody bowel movements   Allergies: Hydrochlorothiazide, Lipitor [atorvastatin], Tizanidine hcl, and Viagra [sildenafil citrate]  Medications: Prior to Admission medications   Medication Sig Start Date End Date Taking? Authorizing Provider  acetaminophen (TYLENOL) 325 MG tablet Take 325-650 mg by mouth every 12 (twelve) hours as needed for mild pain or headache.   Yes [provider]  amLODipine (NORVASC) 10 MG tablet Take 10 mg by mouth daily.   Yes [provider]  Ascorbic Acid (VITAMIN C) 500 MG CAPS Take 1 tablet by mouth daily. 02/08/19  Yes [provider]  aspirin 81 MG chewable tablet Chew 81 mg by mouth every other day.    Yes [provider]  Blood Pressure Monitor DEVI  10/23/19  Yes [provider]  cetirizine (ZYRTEC) 10 MG tablet Take 10 mg by mouth daily as needed for allergies.   Yes [provider]  hydrocortisone (ANUSOL-HC) 25 MG suppository Place 1 suppository (25 mg total) rectally 2 (two) times daily. 04/15/19  Yes Yu, Amy V, PA-C  latanoprost (XALATAN) 0.005 % ophthalmic solution Place 1 drop into both eyes at bedtime. 01/19/19  Yes [provider]  lisinopril-hydrochlorothiazide (ZESTORETIC) 20-12.5 MG tablet Take 1 tablet by mouth daily. 02/15/19  Yes [provider]  meloxicam (MOBIC) 15 MG tablet Take 15 mg by mouth daily as needed. 03/28/19  Yes [provider]  metFORMIN (GLUCOPHAGE-XR) 500 MG 24 hr tablet Take 1,000 mg by mouth daily.    Yes [provider]  predniSONE (DELTASONE) 10 MG tablet Take 10 mg by mouth daily. 04/07/20  Yes [provider]  rosuvastatin (CRESTOR) 5  MG tablet Take 5 mg by mouth daily. 08/26/19  Yes [provider]  tadalafil (CIALIS) 20 MG tablet Take 20 mg by mouth daily as needed for erectile dysfunction. 08/03/19  Yes [provider]  zinc sulfate 220 (50 Zn) MG capsule Take 220 mg by mouth daily. 02/08/19  Yes [provider]  benazepril (LOTENSIN) 40 MG tablet Take 40 mg by mouth daily.  02/02/19  [provider]  zolpidem (AMBIEN) 10 MG tablet Take 10 mg by mouth at bedtime as needed for sleep.   02/02/19  [provider]     Vital Signs: BP (!) 150/54 (BP Location: Left Leg)   Pulse 79   Temp 97.7 F (36.5 C) (Oral)   Resp 18   Ht  (1.854 m)   Wt 224 lb 13.9 oz (102 kg)   SpO2 99%   BMI 29.67 kg/m   Physical Exam  NAD, alert, lying in bed.  Abdomen: soft, non-tender.   Groin: Procedure site intact. Site clean and dry. No evidence of hematoma or pseudoaneurysm.  Pulses: Right-sided foot and ankle swelling noted. Faint DP pulse identified.   Imaging: IR Angiogram Visceral Selective  Result Date: 05/11/2020 INDICATION: 87 year old with persistent lower GI bleeding. Recent CTA demonstrates an area of bleeding in the right colon. Small pulmonary emboli on recent CTA. Patient presents for arterial embolization and IVC filter placement. EXAM: 1. MESENTERIC ANGIOGRAPHY: SMA AND RIGHT COLIC BRANCHES 2. COIL EMBOLIZATION OF RIGHT COLIC ARTERY BRANCH 3. ULTRASOUND GUIDANCE FOR VASCULAR ACCESS 4. PLACEMENT OF IVC FILTER 5.  SELECTIVE CANNULATION OF BILATERAL RENAL VEINS MEDICATIONS: Moderate sedation ANESTHESIA/SEDATION: Moderate (conscious) sedation was employed during this procedure. A total of Versed 1.0 mg and Fentanyl 50 mcg was administered intravenously. Moderate Sedation Time: 66 minutes. The patient's level of consciousness and vital signs were monitored continuously by radiology nursing throughout the procedure under my direct supervision. CONTRAST:  60 mL Omnipaque 300 FLUOROSCOPY  TIME:  Fluoroscopy Time: 19 minutes and 18 seconds COMPLICATIONS: None immediate. PROCEDURE: Informed consent was obtained from the patient following explanation of the procedure, risks, benefits and alternatives. The patient understands, agrees and consents for the procedure. All questions were addressed. A time out was performed prior to the initiation of the procedure. Right groin was prepped and draped in sterile fashion. Maximal barrier sterile technique was utilized including caps, mask, sterile gowns, sterile gloves, sterile drape, hand hygiene and skin antiseptic. Ultrasound confirmed a patent right common femoral vein and right common femoral artery. Ultrasound image saved for documentation. Skin was anesthetized with 1% lidocaine. Small incision was made. 21 gauge needle was directed into the right common femoral vein with ultrasound guidance. Micropuncture dilator set was easily advanced into the vein. Tract was dilated to accommodate the IVC filter sheath. IVC venogram was performed using carbon dioxide. Bilateral renal veins were cannulated with a C2 catheter and Bentson wire. IVC filter was deployed below the renal veins. The venous sheath was removed with manual compression. Another small incision was made. 21 gauge needle was directed into the right common femoral artery with ultrasound guidance. Micropuncture dilator set was placed. Five [place] vascular sheath was placed over a Bentson wire. A C2 catheter was used to cannulate the SMA. SMA arteriography was performed. STC microcatheter was advanced into the right colic artery and additional angiography was performed. Area of bleeding in the ascending colon near the hepatic flexure was identified. Microcatheter was advanced into the feeding artery using a 0.014 Fathom wire. Catheter placement was confirmed within the branch supplying the area of bleeding. Two 2 mm soft IDC Interlock coils were deployed within the feeding artery. Follow-up angiography  demonstrated a small proximal branch that could potentially be supplying the bleeder. A 2 mm low profile Ruby coil was deployed at this branch vessel. Final angiography was performed through the microcatheter. Microcatheter and 5 French catheter removed without complication. Angiogram was performed through the right groin sheath. The right groin sheath was removed using an Angio-Seal closure device. Right groin hemostasis at the end of the procedure. FINDINGS: 1. IVC was patent on the carbon dioxide venogram. Bilateral renal veins were cannulated with the Bentson wire. Filter was deployed below the renal veins. 2. SMA angiography demonstrates a patent SMA with subtle area of bleeding in the ascending colon near the hepatic flexure. This finding corresponded with the area of contrast extravasation on the recent CTA. Microcatheter was advanced into distal right colic artery branches and the area of bleeding was confirmed. Transverse branch supplying the area of bleeding was successfully cannulated and this branch and a small branch coming off it were embolized using 2 mm coils. No evidence for active bleeding following coil embolization. IMPRESSION: 1. Successful coil embolization of a distal right colic artery branch supplying the area of bleeding in the ascending colon near the hepatic flexure. 2. Placement of IVC filter for pulmonary emboli. IVC filter is potentially retrievable. Electronically Signed   By: Richarda Overlie M.D.   On: 05/11/2020 17:42   IR Angiogram Selective Each Additional Vessel  Result Date: 05/11/2020 INDICATION: 87 year old  with persistent lower GI bleeding. Recent CTA demonstrates an area of bleeding in the right colon. Small pulmonary emboli on recent CTA. Patient presents for arterial embolization and IVC filter placement. EXAM: 1. MESENTERIC ANGIOGRAPHY: SMA AND RIGHT COLIC BRANCHES 2. COIL EMBOLIZATION OF RIGHT COLIC ARTERY BRANCH 3. ULTRASOUND GUIDANCE FOR VASCULAR ACCESS 4. PLACEMENT OF  IVC FILTER 5. SELECTIVE CANNULATION OF BILATERAL RENAL VEINS MEDICATIONS: Moderate sedation ANESTHESIA/SEDATION: Moderate (conscious) sedation was employed during this procedure. A total of Versed 1.0 mg and Fentanyl 50 mcg was administered intravenously. Moderate Sedation Time: 66 minutes. The patient's level of consciousness and vital signs were monitored continuously by radiology nursing throughout the procedure under my direct supervision. CONTRAST:  60 mL Omnipaque 300 FLUOROSCOPY TIME:  Fluoroscopy Time: 19 minutes and 18 seconds COMPLICATIONS: None immediate. PROCEDURE: Informed consent was obtained from the patient following explanation of the procedure, risks, benefits and alternatives. The patient understands, agrees and consents for the procedure. All questions were addressed. A time out was performed prior to the initiation of the procedure. Right groin was prepped and draped in sterile fashion. Maximal barrier sterile technique was utilized including caps, mask, sterile gowns, sterile gloves, sterile drape, hand hygiene and skin antiseptic. Ultrasound confirmed a patent right common femoral vein and right common femoral artery. Ultrasound image saved for documentation. Skin was anesthetized with 1% lidocaine. Small incision was made. 21 gauge needle was directed into the right common femoral vein with ultrasound guidance. Micropuncture dilator set was easily advanced into the vein. Tract was dilated to accommodate the IVC filter sheath. IVC venogram was performed using carbon dioxide. Bilateral renal veins were cannulated with a C2 catheter and Bentson wire. IVC filter was deployed below the renal veins. The venous sheath was removed with manual compression. Another small incision was made. 21 gauge needle was directed into the right common femoral artery with ultrasound guidance. Micropuncture dilator set was placed. Five [place] vascular sheath was placed over a Bentson wire. A C2 catheter was used to  cannulate the SMA. SMA arteriography was performed. STC microcatheter was advanced into the right colic artery and additional angiography was performed. Area of bleeding in the ascending colon near the hepatic flexure was identified. Microcatheter was advanced into the feeding artery using a 0.014 Fathom wire. Catheter placement was confirmed within the branch supplying the area of bleeding. Two 2 mm soft IDC Interlock coils were deployed within the feeding artery. Follow-up angiography demonstrated a small proximal branch that could potentially be supplying the bleeder. A 2 mm low profile Ruby coil was deployed at this branch vessel. Final angiography was performed through the microcatheter. Microcatheter and 5 French catheter removed without complication. Angiogram was performed through the right groin sheath. The right groin sheath was removed using an Angio-Seal closure device. Right groin hemostasis at the end of the procedure. FINDINGS: 1. IVC was patent on the carbon dioxide venogram. Bilateral renal veins were cannulated with the Bentson wire. Filter was deployed below the renal veins. 2. SMA angiography demonstrates a patent SMA with subtle area of bleeding in the ascending colon near the hepatic flexure. This finding corresponded with the area of contrast extravasation on the recent CTA. Microcatheter was advanced into distal right colic artery branches and the area of bleeding was confirmed. Transverse branch supplying the area of bleeding was successfully cannulated and this branch and a small branch coming off it were embolized using 2 mm coils. No evidence for active bleeding following coil embolization. IMPRESSION: 1. Successful coil embolization of  a distal right colic artery branch supplying the area of bleeding in the ascending colon near the hepatic flexure. 2. Placement of IVC filter for pulmonary emboli. IVC filter is potentially retrievable. Electronically Signed   By: Richarda Overlie M.D.   On:  05/11/2020 17:42   IR Venogram Renal Bi  Result Date: 05/11/2020 INDICATION: 87 year old with persistent lower GI bleeding. Recent CTA demonstrates an area of bleeding in the right colon. Small pulmonary emboli on recent CTA. Patient presents for arterial embolization and IVC filter placement. EXAM: 1. MESENTERIC ANGIOGRAPHY: SMA AND RIGHT COLIC BRANCHES 2. COIL EMBOLIZATION OF RIGHT COLIC ARTERY BRANCH 3. ULTRASOUND GUIDANCE FOR VASCULAR ACCESS 4. PLACEMENT OF IVC FILTER 5. SELECTIVE CANNULATION OF BILATERAL RENAL VEINS MEDICATIONS: Moderate sedation ANESTHESIA/SEDATION: Moderate (conscious) sedation was employed during this procedure. A total of Versed 1.0 mg and Fentanyl 50 mcg was administered intravenously. Moderate Sedation Time: 66 minutes. The patient's level of consciousness and vital signs were monitored continuously by radiology nursing throughout the procedure under my direct supervision. CONTRAST:  60 mL Omnipaque 300 FLUOROSCOPY TIME:  Fluoroscopy Time: 19 minutes and 18 seconds COMPLICATIONS: None immediate. PROCEDURE: Informed consent was obtained from the patient following explanation of the procedure, risks, benefits and alternatives. The patient understands, agrees and consents for the procedure. All questions were addressed. A time out was performed prior to the initiation of the procedure. Right groin was prepped and draped in sterile fashion. Maximal barrier sterile technique was utilized including caps, mask, sterile gowns, sterile gloves, sterile drape, hand hygiene and skin antiseptic. Ultrasound confirmed a patent right common femoral vein and right common femoral artery. Ultrasound image saved for documentation. Skin was anesthetized with 1% lidocaine. Small incision was made. 21 gauge needle was directed into the right common femoral vein with ultrasound guidance. Micropuncture dilator set was easily advanced into the vein. Tract was dilated to accommodate the IVC filter sheath. IVC  venogram was performed using carbon dioxide. Bilateral renal veins were cannulated with a C2 catheter and Bentson wire. IVC filter was deployed below the renal veins. The venous sheath was removed with manual compression. Another small incision was made. 21 gauge needle was directed into the right common femoral artery with ultrasound guidance. Micropuncture dilator set was placed. Five [place] vascular sheath was placed over a Bentson wire. A C2 catheter was used to cannulate the SMA. SMA arteriography was performed. STC microcatheter was advanced into the right colic artery and additional angiography was performed. Area of bleeding in the ascending colon near the hepatic flexure was identified. Microcatheter was advanced into the feeding artery using a 0.014 Fathom wire. Catheter placement was confirmed within the branch supplying the area of bleeding. Two 2 mm soft IDC Interlock coils were deployed within the feeding artery. Follow-up angiography demonstrated a small proximal branch that could potentially be supplying the bleeder. A 2 mm low profile Ruby coil was deployed at this branch vessel. Final angiography was performed through the microcatheter. Microcatheter and 5 French catheter removed without complication. Angiogram was performed through the right groin sheath. The right groin sheath was removed using an Angio-Seal closure device. Right groin hemostasis at the end of the procedure. FINDINGS: 1. IVC was patent on the carbon dioxide venogram. Bilateral renal veins were cannulated with the Bentson wire. Filter was deployed below the renal veins. 2. SMA angiography demonstrates a patent SMA with subtle area of bleeding in the ascending colon near the hepatic flexure. This finding corresponded with the area of contrast extravasation on the  recent CTA. Microcatheter was advanced into distal right colic artery branches and the area of bleeding was confirmed. Transverse branch supplying the area of bleeding was  successfully cannulated and this branch and a small branch coming off it were embolized using 2 mm coils. No evidence for active bleeding following coil embolization. IMPRESSION: 1. Successful coil embolization of a distal right colic artery branch supplying the area of bleeding in the ascending colon near the hepatic flexure. 2. Placement of IVC filter for pulmonary emboli. IVC filter is potentially retrievable. Electronically Signed   By: Richarda Overlie M.D.   On: 05/11/2020 17:42   IR IVC FILTER PLMT / S&I Lenise Arena GUID/MOD SED  Result Date: 05/11/2020 INDICATION: 87 year old with persistent lower GI bleeding. Recent CTA demonstrates an area of bleeding in the right colon. Small pulmonary emboli on recent CTA. Patient presents for arterial embolization and IVC filter placement. EXAM: 1. MESENTERIC ANGIOGRAPHY: SMA AND RIGHT COLIC BRANCHES 2. COIL EMBOLIZATION OF RIGHT COLIC ARTERY BRANCH 3. ULTRASOUND GUIDANCE FOR VASCULAR ACCESS 4. PLACEMENT OF IVC FILTER 5. SELECTIVE CANNULATION OF BILATERAL RENAL VEINS MEDICATIONS: Moderate sedation ANESTHESIA/SEDATION: Moderate (conscious) sedation was employed during this procedure. A total of Versed 1.0 mg and Fentanyl 50 mcg was administered intravenously. Moderate Sedation Time: 66 minutes. The patient's level of consciousness and vital signs were monitored continuously by radiology nursing throughout the procedure under my direct supervision. CONTRAST:  60 mL Omnipaque 300 FLUOROSCOPY TIME:  Fluoroscopy Time: 19 minutes and 18 seconds COMPLICATIONS: None immediate. PROCEDURE: Informed consent was obtained from the patient following explanation of the procedure, risks, benefits and alternatives. The patient understands, agrees and consents for the procedure. All questions were addressed. A time out was performed prior to the initiation of the procedure. Right groin was prepped and draped in sterile fashion. Maximal barrier sterile technique was utilized including caps, mask,  sterile gowns, sterile gloves, sterile drape, hand hygiene and skin antiseptic. Ultrasound confirmed a patent right common femoral vein and right common femoral artery. Ultrasound image saved for documentation. Skin was anesthetized with 1% lidocaine. Small incision was made. 21 gauge needle was directed into the right common femoral vein with ultrasound guidance. Micropuncture dilator set was easily advanced into the vein. Tract was dilated to accommodate the IVC filter sheath. IVC venogram was performed using carbon dioxide. Bilateral renal veins were cannulated with a C2 catheter and Bentson wire. IVC filter was deployed below the renal veins. The venous sheath was removed with manual compression. Another small incision was made. 21 gauge needle was directed into the right common femoral artery with ultrasound guidance. Micropuncture dilator set was placed. Five [place] vascular sheath was placed over a Bentson wire. A C2 catheter was used to cannulate the SMA. SMA arteriography was performed. STC microcatheter was advanced into the right colic artery and additional angiography was performed. Area of bleeding in the ascending colon near the hepatic flexure was identified. Microcatheter was advanced into the feeding artery using a 0.014 Fathom wire. Catheter placement was confirmed within the branch supplying the area of bleeding. Two 2 mm soft IDC Interlock coils were deployed within the feeding artery. Follow-up angiography demonstrated a small proximal branch that could potentially be supplying the bleeder. A 2 mm low profile Ruby coil was deployed at this branch vessel. Final angiography was performed through the microcatheter. Microcatheter and 5 French catheter removed without complication. Angiogram was performed through the right groin sheath. The right groin sheath was removed using an Angio-Seal closure device. Right groin hemostasis  at the end of the procedure. FINDINGS: 1. IVC was patent on the carbon  dioxide venogram. Bilateral renal veins were cannulated with the Bentson wire. Filter was deployed below the renal veins. 2. SMA angiography demonstrates a patent SMA with subtle area of bleeding in the ascending colon near the hepatic flexure. This finding corresponded with the area of contrast extravasation on the recent CTA. Microcatheter was advanced into distal right colic artery branches and the area of bleeding was confirmed. Transverse branch supplying the area of bleeding was successfully cannulated and this branch and a small branch coming off it were embolized using 2 mm coils. No evidence for active bleeding following coil embolization. IMPRESSION: 1. Successful coil embolization of a distal right colic artery branch supplying the area of bleeding in the ascending colon near the hepatic flexure. 2. Placement of IVC filter for pulmonary emboli. IVC filter is potentially retrievable. Electronically Signed   By: Richarda Overlie M.D.   On: 05/11/2020 17:42   IR US Guide Vasc Access Right  Result Date: 05/11/2020 INDICATION: 87 year old with persistent lower GI bleeding. Recent CTA demonstrates an area of bleeding in the right colon. Small pulmonary emboli on recent CTA. Patient presents for arterial embolization and IVC filter placement. EXAM: 1. MESENTERIC ANGIOGRAPHY: SMA AND RIGHT COLIC BRANCHES 2. COIL EMBOLIZATION OF RIGHT COLIC ARTERY BRANCH 3. ULTRASOUND GUIDANCE FOR VASCULAR ACCESS 4. PLACEMENT OF IVC FILTER 5. SELECTIVE CANNULATION OF BILATERAL RENAL VEINS MEDICATIONS: Moderate sedation ANESTHESIA/SEDATION: Moderate (conscious) sedation was employed during this procedure. A total of Versed 1.0 mg and Fentanyl 50 mcg was administered intravenously. Moderate Sedation Time: 66 minutes. The patient's level of consciousness and vital signs were monitored continuously by radiology nursing throughout the procedure under my direct supervision. CONTRAST:  60 mL Omnipaque 300 FLUOROSCOPY TIME:  Fluoroscopy  Time: 19 minutes and 18 seconds COMPLICATIONS: None immediate. PROCEDURE: Informed consent was obtained from the patient following explanation of the procedure, risks, benefits and alternatives. The patient understands, agrees and consents for the procedure. All questions were addressed. A time out was performed prior to the initiation of the procedure. Right groin was prepped and draped in sterile fashion. Maximal barrier sterile technique was utilized including caps, mask, sterile gowns, sterile gloves, sterile drape, hand hygiene and skin antiseptic. Ultrasound confirmed a patent right common femoral vein and right common femoral artery. Ultrasound image saved for documentation. Skin was anesthetized with 1% lidocaine. Small incision was made. 21 gauge needle was directed into the right common femoral vein with ultrasound guidance. Micropuncture dilator set was easily advanced into the vein. Tract was dilated to accommodate the IVC filter sheath. IVC venogram was performed using carbon dioxide. Bilateral renal veins were cannulated with a C2 catheter and Bentson wire. IVC filter was deployed below the renal veins. The venous sheath was removed with manual compression. Another small incision was made. 21 gauge needle was directed into the right common femoral artery with ultrasound guidance. Micropuncture dilator set was placed. Five [place] vascular sheath was placed over a Bentson wire. A C2 catheter was used to cannulate the SMA. SMA arteriography was performed. STC microcatheter was advanced into the right colic artery and additional angiography was performed. Area of bleeding in the ascending colon near the hepatic flexure was identified. Microcatheter was advanced into the feeding artery using a 0.014 Fathom wire. Catheter placement was confirmed within the branch supplying the area of bleeding. Two 2 mm soft IDC Interlock coils were deployed within the feeding artery. Follow-up angiography demonstrated a  small proximal branch that could potentially be supplying the bleeder. A 2 mm low profile Ruby coil was deployed at this branch vessel. Final angiography was performed through the microcatheter. Microcatheter and 5 French catheter removed without complication. Angiogram was performed through the right groin sheath. The right groin sheath was removed using an Angio-Seal closure device. Right groin hemostasis at the end of the procedure. FINDINGS: 1. IVC was patent on the carbon dioxide venogram. Bilateral renal veins were cannulated with the Bentson wire. Filter was deployed below the renal veins. 2. SMA angiography demonstrates a patent SMA with subtle area of bleeding in the ascending colon near the hepatic flexure. This finding corresponded with the area of contrast extravasation on the recent CTA. Microcatheter was advanced into distal right colic artery branches and the area of bleeding was confirmed. Transverse branch supplying the area of bleeding was successfully cannulated and this branch and a small branch coming off it were embolized using 2 mm coils. No evidence for active bleeding following coil embolization. IMPRESSION: 1. Successful coil embolization of a distal right colic artery branch supplying the area of bleeding in the ascending colon near the hepatic flexure. 2. Placement of IVC filter for pulmonary emboli. IVC filter is potentially retrievable. Electronically Signed   By: Richarda Overlie M.D.   On: 05/11/2020 17:42   IR EMBO ART  VEN HEMORR LYMPH EXTRAV  INC GUIDE ROADMAPPING  Result Date: 05/11/2020 INDICATION: 87 year old with persistent lower GI bleeding. Recent CTA demonstrates an area of bleeding in the right colon. Small pulmonary emboli on recent CTA. Patient presents for arterial embolization and IVC filter placement. EXAM: 1. MESENTERIC ANGIOGRAPHY: SMA AND RIGHT COLIC BRANCHES 2. COIL EMBOLIZATION OF RIGHT COLIC ARTERY BRANCH 3. ULTRASOUND GUIDANCE FOR VASCULAR ACCESS 4. PLACEMENT OF  IVC FILTER 5. SELECTIVE CANNULATION OF BILATERAL RENAL VEINS MEDICATIONS: Moderate sedation ANESTHESIA/SEDATION: Moderate (conscious) sedation was employed during this procedure. A total of Versed 1.0 mg and Fentanyl 50 mcg was administered intravenously. Moderate Sedation Time: 66 minutes. The patient's level of consciousness and vital signs were monitored continuously by radiology nursing throughout the procedure under my direct supervision. CONTRAST:  60 mL Omnipaque 300 FLUOROSCOPY TIME:  Fluoroscopy Time: 19 minutes and 18 seconds COMPLICATIONS: None immediate. PROCEDURE: Informed consent was obtained from the patient following explanation of the procedure, risks, benefits and alternatives. The patient understands, agrees and consents for the procedure. All questions were addressed. A time out was performed prior to the initiation of the procedure. Right groin was prepped and draped in sterile fashion. Maximal barrier sterile technique was utilized including caps, mask, sterile gowns, sterile gloves, sterile drape, hand hygiene and skin antiseptic. Ultrasound confirmed a patent right common femoral vein and right common femoral artery. Ultrasound image saved for documentation. Skin was anesthetized with 1% lidocaine. Small incision was made. 21 gauge needle was directed into the right common femoral vein with ultrasound guidance. Micropuncture dilator set was easily advanced into the vein. Tract was dilated to accommodate the IVC filter sheath. IVC venogram was performed using carbon dioxide. Bilateral renal veins were cannulated with a C2 catheter and Bentson wire. IVC filter was deployed below the renal veins. The venous sheath was removed with manual compression. Another small incision was made. 21 gauge needle was directed into the right common femoral artery with ultrasound guidance. Micropuncture dilator set was placed. Five [place] vascular sheath was placed over a Bentson wire. A C2 catheter was used to  cannulate the SMA. SMA arteriography was performed. STC microcatheter  was advanced into the right colic artery and additional angiography was performed. Area of bleeding in the ascending colon near the hepatic flexure was identified. Microcatheter was advanced into the feeding artery using a 0.014 [place] wire. Catheter placement was confirmed within the branch supplying the area of bleeding. Two 2 mm soft IDC Interlock coils were deployed within the feeding artery. Follow-up angiography demonstrated a small proximal branch that could potentially be supplying the bleeder. A 2 mm low profile Ruby coil was deployed at this branch vessel. Final angiography was performed through the microcatheter. Microcatheter and 5 French catheter removed without complication. Angiogram was performed through the right groin sheath. The right groin sheath was removed using an Angio-Seal closure device. Right groin hemostasis at the end of the procedure. FINDINGS: 1. IVC was patent on the carbon dioxide venogram. Bilateral renal veins were cannulated with the Bentson wire. Filter was deployed below the renal veins. 2. SMA angiography demonstrates a patent SMA with subtle area of bleeding in the ascending colon near the hepatic flexure. This finding corresponded with the area of contrast extravasation on the recent CTA. Microcatheter was advanced into distal right colic artery branches and the area of bleeding was confirmed. Transverse branch supplying the area of bleeding was successfully cannulated and this branch and a small branch coming off it were embolized using 2 mm coils. No evidence for active bleeding following coil embolization. IMPRESSION: 1. Successful coil embolization of a distal right colic artery branch supplying the area of bleeding in the ascending colon near the hepatic flexure. 2. Placement of IVC filter for pulmonary emboli. IVC filter is potentially retrievable. Electronically Signed   By: Richarda OverlieAdam  Joseph M.D.   On:  05/11/2020 17:42   VAS US LOWER EXTREMITY VENOUS (DVT)  Result Date: 05/12/2020  Lower Venous DVT Study Patient Name:  Thomas Joseph  Date of Exam:   05/11/2020 Medical Rec #: [id number]          Accession #:    [id number] Date of Birth: 1933-09-20          Patient Gender: M Patient Age:   [age]Y [facility] Procedure:      VAS US LOWER EXTREMITY VENOUS (DVT) Referring Phys: [id number] [facility] --------------------------------------------------------------------------------  Indications: Pulmonary embolism.  Risk Factors: Patient had IVC filter placed today. GI bleed. Limitations: Patient unable to move legs secondary to needing to remain flat post procedure. and bandages. Comparison Study: No prior study on file Performing Technologist: Sherren Kernsandace Kanady RVS  Examination Guidelines: A complete evaluation includes B-mode imaging, spectral Doppler, color Doppler, and power Doppler as needed of all accessible portions of each vessel. Bilateral testing is considered an integral part of a complete examination. Limited examinations for reoccurring indications may be performed as noted. The reflux portion of the exam is performed with the patient in reverse Trendelenburg.  +---------+---------------+---------+-----------+----------+-------------------+ RIGHT    CompressibilityPhasicitySpontaneityPropertiesThrombus Aging      +---------+---------------+---------+-----------+----------+-------------------+ CFV                     Yes      Yes                  patent by color and  Doppler             +---------+---------------+---------+-----------+----------+-------------------+ SFJ                                                   bandage in way      +---------+---------------+---------+-----------+----------+-------------------+ FV Prox  Full                                                              +---------+---------------+---------+-----------+----------+-------------------+ FV Mid   Full                                                             +---------+---------------+---------+-----------+----------+-------------------+ FV DistalFull                                                             +---------+---------------+---------+-----------+----------+-------------------+ PFV      Full                                                             +---------+---------------+---------+-----------+----------+-------------------+ POP                     Yes      Yes                  patent by color and                                                       Doppler             +---------+---------------+---------+-----------+----------+-------------------+ PTV      Full                                                             +---------+---------------+---------+-----------+----------+-------------------+ PERO     None                                         Age Indeterminate   +---------+---------------+---------+-----------+----------+-------------------+   +---------+---------------+---------+-----------+----------+-------------------+ LEFT     CompressibilityPhasicitySpontaneityPropertiesThrombus Aging      +---------+---------------+---------+-----------+----------+-------------------+ CFV      Full  Yes      Yes                                      +---------+---------------+---------+-----------+----------+-------------------+ SFJ      Full                                                             +---------+---------------+---------+-----------+----------+-------------------+ FV Prox  Full                                                             +---------+---------------+---------+-----------+----------+-------------------+ FV Mid   Full                                                              +---------+---------------+---------+-----------+----------+-------------------+ FV DistalFull                                                             +---------+---------------+---------+-----------+----------+-------------------+ PFV      Full                                                             +---------+---------------+---------+-----------+----------+-------------------+ POP                     Yes      Yes                  patent by color and                                                       Doppler             +---------+---------------+---------+-----------+----------+-------------------+ PTV      None                                         Age Indeterminate   +---------+---------------+---------+-----------+----------+-------------------+ PERO     None                                         Age Indeterminate   +---------+---------------+---------+-----------+----------+-------------------+  Summary: RIGHT: - Findings consistent with age indeterminate deep vein thrombosis involving the right peroneal veins.  LEFT: - Findings consistent with age indeterminate deep vein thrombosis involving the left posterior tibial veins, and left peroneal veins.  *See table(s) above for measurements and observations. Electronically signed by Fabienne Bruns MD on 05/12/2020 at 2:32:16 PM.    Final    [place] EKG SITE RITE  Result Date: 05/10/2020 If Site Rite image not attached, placement could not be confirmed due to current cardiac rhythm.  CT Angio Abd/Pel w/ and/or w/o  Addendum Date: 05/11/2020   ADDENDUM REPORT: 05/11/2020 15:00 ADDENDUM: Patient is also noted to have a new filling defect involving a subsegmental right lower lobe pulmonary artery (image 14/series 5) consistent with acute pulmonary embolus. Electronically Signed   By: Kennith Center M.D.   On: 05/11/2020 15:00   Addendum Date: 05/11/2020   ADDENDUM REPORT: 05/11/2020 13:42 ADDENDUM: After  speaking with Dr. Lowella Dandy, there is a focus of contrast extravasation in the ascending colon near the hepatic flexure associated with a diverticulum. This is compatible with active extravasation in the right colon. Electronically Signed   By: Kennith Center M.D.   On: 05/11/2020 13:42   Result Date: 05/11/2020 CLINICAL DATA:  GI bleed. EXAM: CTA ABDOMEN AND PELVIS WITHOUT AND WITH CONTRAST TECHNIQUE: Multidetector CT imaging of the abdomen and pelvis was performed using the standard protocol during bolus administration of intravenous contrast. Multiplanar reconstructed images and MIPs were obtained and reviewed to evaluate the vascular anatomy. CONTRAST:  75mL OMNIPAQUE IOHEXOL 300 MG/ML  SOLN COMPARISON:  05/08/2020 FINDINGS: VASCULAR Aorta: Normal caliber aorta without aneurysm, dissection, vasculitis or significant stenosis. Celiac: Patent without evidence of aneurysm, dissection, vasculitis or significant stenosis. SMA: Patent without evidence of aneurysm, dissection, vasculitis or significant stenosis. Renals: Both renal arteries are patent without evidence of aneurysm, dissection, vasculitis, fibromuscular dysplasia or significant stenosis. Accessory renal arteries noted bilaterally. IMA: Patent without evidence of aneurysm, dissection, vasculitis or significant stenosis. Inflow: Patent without evidence of aneurysm, dissection, vasculitis or significant stenosis. Proximal Outflow: Bilateral common femoral and visualized portions of the superficial and profunda femoral arteries are patent without evidence of aneurysm, dissection, vasculitis or significant stenosis. Veins: No obvious venous abnormality within the limitations of this arterial phase study. Review of the MIP images confirms the above findings. NON-VASCULAR Lower chest: Mild bronchiectasis noted in the lower lobes bilaterally. Hepatobiliary: Multiple scattered small hepatic cysts with additional tiny hypodense lesions in the liver too small to  characterize. Subtle nodular contour raises the question of cirrhosis. There is no evidence for gallstones, gallbladder wall thickening, or pericholecystic fluid. No intrahepatic or extrahepatic biliary dilation. Pancreas: 2.4 cm subtle heterogeneous hypoattenuating lesion in the uncinate process of the pancreas is similar to recent study but progressive compared to 12/17/2017 when it measured approximately 1.8 cm. The relatively slow progression suggests benign etiology. Spleen: No splenomegaly. No focal mass lesion. Adrenals/Urinary Tract: No adrenal nodule or mass. Stable exophytic large right renal cyst with additional scattered small low-density renal lesions, also most likely benign. No evidence for hydroureter. The urinary bladder appears normal for the degree of distention. Stomach/Bowel: Tiny hiatal hernia. Stomach otherwise unremarkable. No bowel dilatation. Extensive colonic diverticular change noted. No evidence for contrast extravasation into bowel on arterial or portal venous phase imaging to suggest active bleeding. Lymphatic: There is no gastrohepatic or hepatoduodenal ligament lymphadenopathy. No retroperitoneal or mesenteric lymphadenopathy. No pelvic sidewall lymphadenopathy. Reproductive: The prostate gland and seminal vesicles are unremarkable. Other: No intraperitoneal  free fluid. Musculoskeletal: No worrisome lytic or sclerotic osseous abnormality. IMPRESSION: VASCULAR 1. No findings to suggest active GI bleeding. 2. Aortic atherosclerosis without aneurysm. NON-VASCULAR 1. 2.4 cm subtle hypoattenuating lesion in the uncinate process of the pancreas, slightly progressive since 2019. Likely benign. Abdominal MRI with and without contrast could be used to further evaluate as clinically warranted. 2. Subtle nodularity of hepatic contour raises the question of cirrhosis. 3. Hepatic and renal cysts. 4. Left colonic diverticulosis without findings of diverticulitis. Electronically Signed: By: Kennith Center M.D. On: 05/11/2020 12:37    Labs:  CBC: Recent Labs    05/10/20 0112 05/10/20 1621 05/10/20 2310 05/11/20 0545 05/11/20 1819 05/12/20 0200  WBC 14.1*  --   --  18.3* 12.6* 12.8*  HGB 6.3*   < > 7.5* 7.0* 6.3* 7.4*  HCT 18.7*   < > 22.7* 21.1* 18.6* 21.9*  PLT 109*  --   --  111* 88* 86*   < > = values in this interval not displayed.    COAGS: Recent Labs    05/05/20 1155  INR 1.1    BMP: Recent Labs    05/08/20 0600 05/10/20 0112 05/11/20 0545 05/12/20 0200  NA 140 140 138 137  K 4.0 4.1 3.6 3.7  CL 112* 111 109 109  CO2 20* 20* 21* 23  GLUCOSE 202* 175* 186* 191*  BUN 40* 37* 43* 38*  CALCIUM 7.3* 7.6* 7.2* 7.1*  CREATININE 1.71* 1.65* 1.76* 1.75*  GFRNONAA 39* 41* 38* 38*    LIVER FUNCTION TESTS: Recent Labs    05/05/20 1026 05/08/20 0600 05/11/20 0545 05/12/20 0200  BILITOT 0.9 0.4 0.5 0.7  AST ALT ALKPHOS 50 36* 38 33*  PROT 5.4* 4.2* 3.9* 3.4*  ALBUMIN 2.9* 2.3* 2.2* 2.1*    Assessment and Plan: GI bleed s/p coil embolization of right colic artery branch with IVC filter placement Patient s/p coil embolization yesterday.  Hgb improved to 7.3 today.  He has required several unit for resusitation over the past several days.  No further bloody bowel movements.  No hematemesis.  Procedure site intact without issue.  Discussed procedure with patient and wife and bedside.  All questions answered.  IR remains available if needed.  Electronically Signed: Hoyt Koch, PA 05/12/2020, 3:38 PM   I spent a total of 15 Minutes at the the patient's bedside AND on the patient's hospital floor or unit, greater than 50% of which was counseling/coordinating care for GI bleed.

## 2020-05-12 NOTE — TOC Progression Note (Signed)
Transition of Care [facility]) - Progression Note    Patient Details  Name: Thomas Joseph MRN: [id number] Date of Birth: 1933-06-07  [facility] CM/SW Contact  Leone Haven, RN Phone Number: 05/12/2020, 2:57 PM  Clinical Narrative:    Patient lives at home with his wife, he has a walker, cane and a w/chair, scale , bp cuff , and pulse oximetry at home.  He gets meds from [facility] on [address].  NCM offered choice for HHPT, he states he prefers to go to outpatient physical therapy on [facility].  NCM informed him that can make this referral for him.  Outpatient pt referral made ,listed on AVS.  He has no issues with transportation or meds.        Expected Discharge Plan and Services                                                 Social Determinants of Health (SDOH) Interventions    Readmission Risk Interventions No flowsheet data found.

## 2020-05-12 NOTE — Progress Notes (Signed)
PROGRESS NOTE   Thomas Joseph  ZOX:[id number] DOB: [birth year] DOA: 05/05/2020 PCP: Mila Palmer, MD  Brief Narrative:   37 black male admitted from home HTN, HLD, DM TY 2 COVID-19 positive 02/08/2019 BPH status post TURP 12/26/2014  New onset melena 05/05/2020-hemoglobin 12.7-->9.6 [facility] gastroenterology consulted on admit Patient transfused 1 unit PRBC Patient found to have azotemia GI consulted EGD no source of bleeding 4/26 Colonoscopy unrevealing 4/27 secondary to poor prep however blood found in colonoscopy therefore bleeding scan performed and mild positive IR consulted--nuclear bleeding scan performed showing bleeding ascending colon Multiphase CT subsequently did not reveal a source  Patient has been transfused about 6 to 7 units of PRBC He however has not had a stool despite all of this Further ongoing discussions with GI and IR   Patient had brisk bleeding underwent mesenteric angiography and coiling 5/1 He has stabilized  Hospital-Problem based course  Lower GI bleeding ascending colon Embolization right colic artery 5/1 Dr. Lowella Dandy Hemodynamics have remained stable despite the need for multiple units of blood transfusions 7 units PRBC transfused till date Multiphase CT abdomen pelvis did not confirm bleeding Colonoscopy 5/1 = blood in colon Status post coil embolization right colic artery Dr. Lowella Dandy Continue clear liquid diet at this time and do not graduate until cleared by GI with regards to the same Paroxysmal A. fib Echocardiogram still pending Occurred in the setting of large-volume GI bleed-keep on monitors Is predominantly in sinus rhythm at this time ?  Lower extremity DVT-small pulmonary embolism on imaging Not a candidate for anticoagulation given massive bleed-subrenal artery IVC filter placed 5/1 Lower extremity duplex + DVT 5/1--- hold chest CT given azotemia Hematoma left arm at midline site Extravasation as well as hematoma on left arm--midline  removed PICC line to be placed on opposite side for blood infusion Leukocytosis secondary to stress of GI bleed Etiology unclear--has been on steroids prior to admission-no further work-up DM TY 2 Taper prednisone 2.5X ---3 to 4 days then stop Continue sliding scale alone Resume diet post all procedures as per GI Holding metformin 1000 daily AKi on admit Mild metabolic acidosis probably from reequilibrated Azotemia resolved--monitor trends--hold diuretics and ACE HTN At this time hold amlodipine 10 Zestoretic 1  DVT prophylaxis:  SCD  code Status: Full Family Communication: Discussed with patient's wife several days in a row with regards to plans [facility] Spouse [phone]  [phone]    Disposition:  Status is: Inpatient  Remains inpatient appropriate because:Ongoing diagnostic testing needed not appropriate for outpatient work up and Unsafe d/c plan  Dispo: The patient is from: Home              Anticipated d/c is to: Home              Patient currently is not medically stable to d/c.   Difficult to place patient No    Consultants:   Gastroenterology  Interventional radiology  Procedures:  Upper endoscopy 4/26 Colonoscopy 4/27 Bleeding scan 4/27 Multiphase CT 4/28 showing no bleeding Mesenteric arteriogram/IVC filter 5/1 1) IVC filter placed below renal veins 2) Active bleeding from right colic artery branch.  Successful coil embolization of feeding branch to bleeding site. 3) Right groin closure with Angioseal device. Lower extremity duplex RIGHT:  - Findings consistent with age indeterminate deep vein thrombosis  involving the right peroneal veins.     LEFT:  - Findings consistent with age indeterminate deep vein thrombosis  involving the left posterior tibial veins, and left peroneal veins.     *  See table(s) above for measurements and observations.   Antimicrobials: None   Subjective:  Well no distress sitting up  some Hungry   Objective: Vitals:   05/11/20 2000 05/11/20 2036 05/11/20 2300 05/12/20 0800  BP: (!) 158/53 (!) 153/49 (!) 169/48 (!) 141/52  Pulse: 73 81 81 75  Resp: 19 18 20 20   Temp: 97.6 F (36.4 C) (!) 97.5 F (36.4 C) (!) 97.4 F (36.3 C) 97.8 F (36.6 C)  TempSrc: Oral Oral Oral Oral  SpO2: 98%  100% 99%  Weight:      Height:        Intake/Output Summary (Last 24 hours) at 05/12/2020 0955 Last data filed at 05/11/2020 2300 Gross per 24 hour  Intake 718 ml  Output 300 ml  Net 418 ml   Filed Weights   05/05/20 1022 05/07/20 0831 05/11/20 0820  Weight: 102 kg 102 kg 102 kg    Examination:  eomi ncat  cta b abd soft no rebound no guarding S1-S2 no murmur no rub no gallop Consistently sinus rhythm since yesterday Neurologically intact no focal deficit moving all 4 limbs equally with good power Euthymic  Data Reviewed: personally reviewed   CBC    Component Value Date/Time   WBC 12.8 (H) 05/12/2020 0200   RBC 2.48 (L) 05/12/2020 0200   HGB 7.4 (L) 05/12/2020 0200   HCT 21.9 (L) 05/12/2020 0200   PLT 86 (L) 05/12/2020 0200   MCV 88.3 05/12/2020 0200   MCH 29.8 05/12/2020 0200   MCHC 33.8 05/12/2020 0200   RDW 16.2 (H) 05/12/2020 0200   LYMPHSABS 1.3 05/10/2020 0112   MONOABS 1.1 (H) 05/10/2020 0112   EOSABS 0.1 05/10/2020 0112   BASOSABS 0.0 05/10/2020 0112   CMP Latest Ref Rng & Units 05/12/2020 05/11/2020 05/10/2020  Glucose 70 - 99 mg/dL 562(Z191(H) 308(M186(H) 578(I175(H)  BUN 8 - 23 mg/dL 69(G38(H) 29(B43(H) 28(U37(H)  Creatinine 0.61 - 1.24 mg/dL 1.32(G1.75(H) 4.01(U1.76(H) 2.72(Z1.65(H)  Sodium 135 - 145 mmol/L 137 138 140  Potassium 3.5 - 5.1 mmol/L 3.7 3.6 4.1  Chloride 98 - 111 mmol/L 109 109 111  CO2 22 - 32 mmol/L 23 21(L) 20(L)  Calcium 8.9 - 10.3 mg/dL 7.1(L) 7.2(L) 7.6(L)  Total Protein 6.5 - 8.1 g/dL 3.6(U3.4(L) 3.9(L) -  Total Bilirubin 0.3 - 1.2 mg/dL 0.7 0.5 -  Alkaline Phos 38 - 126 U/L 33(L) 38 -  AST 15 - 41 U/L 16 20 -  ALT 0 - 44 U/L 12 13 -     Radiology Studies: IR  Angiogram Visceral Selective  Result Date: 05/11/2020 INDICATION: 87 year old with persistent lower GI bleeding. Recent CTA demonstrates an area of bleeding in the right colon. Small pulmonary emboli on recent CTA. Patient presents for arterial embolization and IVC filter placement. EXAM: 1. MESENTERIC ANGIOGRAPHY: SMA AND RIGHT COLIC BRANCHES 2. COIL EMBOLIZATION OF RIGHT COLIC ARTERY BRANCH 3. ULTRASOUND GUIDANCE FOR VASCULAR ACCESS 4. PLACEMENT OF IVC FILTER 5. SELECTIVE CANNULATION OF BILATERAL RENAL VEINS MEDICATIONS: Moderate sedation ANESTHESIA/SEDATION: Moderate (conscious) sedation was employed during this procedure. A total of Versed 1.0 mg and Fentanyl 50 mcg was administered intravenously. Moderate Sedation Time: 66 minutes. The patient's level of consciousness and vital signs were monitored continuously by radiology nursing throughout the procedure under my direct supervision. CONTRAST:  60 mL Omnipaque 300 FLUOROSCOPY TIME:  Fluoroscopy Time: 19 minutes and 18 seconds COMPLICATIONS: None immediate. PROCEDURE: Informed consent was obtained from the patient following explanation of the procedure, risks, benefits and alternatives. The patient  understands, agrees and consents for the procedure. All questions were addressed. A time out was performed prior to the initiation of the procedure. Right groin was prepped and draped in sterile fashion. Maximal barrier sterile technique was utilized including caps, mask, sterile gowns, sterile gloves, sterile drape, hand hygiene and skin antiseptic. Ultrasound confirmed a patent right common femoral vein and right common femoral artery. Ultrasound image saved for documentation. Skin was anesthetized with 1% lidocaine. Small incision was made. 21 gauge needle was directed into the right common femoral vein with ultrasound guidance. Micropuncture dilator set was easily advanced into the vein. Tract was dilated to accommodate the IVC filter sheath. IVC venogram was  performed using carbon dioxide. Bilateral renal veins were cannulated with a C2 catheter and Bentson wire. IVC filter was deployed below the renal veins. The venous sheath was removed with manual compression. Another small incision was made. 21 gauge needle was directed into the right common femoral artery with ultrasound guidance. Micropuncture dilator set was placed. Five [place] vascular sheath was placed over a Bentson wire. A C2 catheter was used to cannulate the SMA. SMA arteriography was performed. STC microcatheter was advanced into the right colic artery and additional angiography was performed. Area of bleeding in the ascending colon near the hepatic flexure was identified. Microcatheter was advanced into the feeding artery using a 0.014 Fathom wire. Catheter placement was confirmed within the branch supplying the area of bleeding. Two 2 mm soft IDC Interlock coils were deployed within the feeding artery. Follow-up angiography demonstrated a small proximal branch that could potentially be supplying the bleeder. A 2 mm low profile Ruby coil was deployed at this branch vessel. Final angiography was performed through the microcatheter. Microcatheter and 5 French catheter removed without complication. Angiogram was performed through the right groin sheath. The right groin sheath was removed using an Angio-Seal closure device. Right groin hemostasis at the end of the procedure. FINDINGS: 1. IVC was patent on the carbon dioxide venogram. Bilateral renal veins were cannulated with the Bentson wire. Filter was deployed below the renal veins. 2. SMA angiography demonstrates a patent SMA with subtle area of bleeding in the ascending colon near the hepatic flexure. This finding corresponded with the area of contrast extravasation on the recent CTA. Microcatheter was advanced into distal right colic artery branches and the area of bleeding was confirmed. Transverse branch supplying the area of bleeding was successfully  cannulated and this branch and a small branch coming off it were embolized using 2 mm coils. No evidence for active bleeding following coil embolization. IMPRESSION: 1. Successful coil embolization of a distal right colic artery branch supplying the area of bleeding in the ascending colon near the hepatic flexure. 2. Placement of IVC filter for pulmonary emboli. IVC filter is potentially retrievable. Electronically Signed   By: Richarda Overlie M.D.   On: 05/11/2020 17:42   IR Angiogram Selective Each Additional Vessel  Result Date: 05/11/2020 INDICATION: 87 year old with persistent lower GI bleeding. Recent CTA demonstrates an area of bleeding in the right colon. Small pulmonary emboli on recent CTA. Patient presents for arterial embolization and IVC filter placement. EXAM: 1. MESENTERIC ANGIOGRAPHY: SMA AND RIGHT COLIC BRANCHES 2. COIL EMBOLIZATION OF RIGHT COLIC ARTERY BRANCH 3. ULTRASOUND GUIDANCE FOR VASCULAR ACCESS 4. PLACEMENT OF IVC FILTER 5. SELECTIVE CANNULATION OF BILATERAL RENAL VEINS MEDICATIONS: Moderate sedation ANESTHESIA/SEDATION: Moderate (conscious) sedation was employed during this procedure. A total of Versed 1.0 mg and Fentanyl 50 mcg was administered intravenously. Moderate Sedation Time: 33  minutes. The patient's level of consciousness and vital signs were monitored continuously by radiology nursing throughout the procedure under my direct supervision. CONTRAST:  60 mL Omnipaque 300 FLUOROSCOPY TIME:  Fluoroscopy Time: 19 minutes and 18 seconds COMPLICATIONS: None immediate. PROCEDURE: Informed consent was obtained from the patient following explanation of the procedure, risks, benefits and alternatives. The patient understands, agrees and consents for the procedure. All questions were addressed. A time out was performed prior to the initiation of the procedure. Right groin was prepped and draped in sterile fashion. Maximal barrier sterile technique was utilized including caps, mask, sterile  gowns, sterile gloves, sterile drape, hand hygiene and skin antiseptic. Ultrasound confirmed a patent right common femoral vein and right common femoral artery. Ultrasound image saved for documentation. Skin was anesthetized with 1% lidocaine. Small incision was made. 21 gauge needle was directed into the right common femoral vein with ultrasound guidance. Micropuncture dilator set was easily advanced into the vein. Tract was dilated to accommodate the IVC filter sheath. IVC venogram was performed using carbon dioxide. Bilateral renal veins were cannulated with a C2 catheter and Bentson wire. IVC filter was deployed below the renal veins. The venous sheath was removed with manual compression. Another small incision was made. 21 gauge needle was directed into the right common femoral artery with ultrasound guidance. Micropuncture dilator set was placed. Five [place] vascular sheath was placed over a Bentson wire. A C2 catheter was used to cannulate the SMA. SMA arteriography was performed. STC microcatheter was advanced into the right colic artery and additional angiography was performed. Area of bleeding in the ascending colon near the hepatic flexure was identified. Microcatheter was advanced into the feeding artery using a 0.014 Fathom wire. Catheter placement was confirmed within the branch supplying the area of bleeding. Two 2 mm soft IDC Interlock coils were deployed within the feeding artery. Follow-up angiography demonstrated a small proximal branch that could potentially be supplying the bleeder. A 2 mm low profile Ruby coil was deployed at this branch vessel. Final angiography was performed through the microcatheter. Microcatheter and 5 French catheter removed without complication. Angiogram was performed through the right groin sheath. The right groin sheath was removed using an Angio-Seal closure device. Right groin hemostasis at the end of the procedure. FINDINGS: 1. IVC was patent on the carbon dioxide  venogram. Bilateral renal veins were cannulated with the Bentson wire. Filter was deployed below the renal veins. 2. SMA angiography demonstrates a patent SMA with subtle area of bleeding in the ascending colon near the hepatic flexure. This finding corresponded with the area of contrast extravasation on the recent CTA. Microcatheter was advanced into distal right colic artery branches and the area of bleeding was confirmed. Transverse branch supplying the area of bleeding was successfully cannulated and this branch and a small branch coming off it were embolized using 2 mm coils. No evidence for active bleeding following coil embolization. IMPRESSION: 1. Successful coil embolization of a distal right colic artery branch supplying the area of bleeding in the ascending colon near the hepatic flexure. 2. Placement of IVC filter for pulmonary emboli. IVC filter is potentially retrievable. Electronically Signed   By: Richarda Overlie M.D.   On: 05/11/2020 17:42   IR Venogram Renal Bi  Result Date: 05/11/2020 INDICATION: 87 year old with persistent lower GI bleeding. Recent CTA demonstrates an area of bleeding in the right colon. Small pulmonary emboli on recent CTA. Patient presents for arterial embolization and IVC filter placement. EXAM: 1. MESENTERIC ANGIOGRAPHY: SMA AND  RIGHT COLIC BRANCHES 2. COIL EMBOLIZATION OF RIGHT COLIC ARTERY BRANCH 3. ULTRASOUND GUIDANCE FOR VASCULAR ACCESS 4. PLACEMENT OF IVC FILTER 5. SELECTIVE CANNULATION OF BILATERAL RENAL VEINS MEDICATIONS: Moderate sedation ANESTHESIA/SEDATION: Moderate (conscious) sedation was employed during this procedure. A total of Versed 1.0 mg and Fentanyl 50 mcg was administered intravenously. Moderate Sedation Time: 66 minutes. The patient's level of consciousness and vital signs were monitored continuously by radiology nursing throughout the procedure under my direct supervision. CONTRAST:  60 mL Omnipaque 300 FLUOROSCOPY TIME:  Fluoroscopy Time: 19 minutes and  18 seconds COMPLICATIONS: None immediate. PROCEDURE: Informed consent was obtained from the patient following explanation of the procedure, risks, benefits and alternatives. The patient understands, agrees and consents for the procedure. All questions were addressed. A time out was performed prior to the initiation of the procedure. Right groin was prepped and draped in sterile fashion. Maximal barrier sterile technique was utilized including caps, mask, sterile gowns, sterile gloves, sterile drape, hand hygiene and skin antiseptic. Ultrasound confirmed a patent right common femoral vein and right common femoral artery. Ultrasound image saved for documentation. Skin was anesthetized with 1% lidocaine. Small incision was made. 21 gauge needle was directed into the right common femoral vein with ultrasound guidance. Micropuncture dilator set was easily advanced into the vein. Tract was dilated to accommodate the IVC filter sheath. IVC venogram was performed using carbon dioxide. Bilateral renal veins were cannulated with a C2 catheter and Bentson wire. IVC filter was deployed below the renal veins. The venous sheath was removed with manual compression. Another small incision was made. 21 gauge needle was directed into the right common femoral artery with ultrasound guidance. Micropuncture dilator set was placed. Five [place] vascular sheath was placed over a Bentson wire. A C2 catheter was used to cannulate the SMA. SMA arteriography was performed. STC microcatheter was advanced into the right colic artery and additional angiography was performed. Area of bleeding in the ascending colon near the hepatic flexure was identified. Microcatheter was advanced into the feeding artery using a 0.014 Fathom wire. Catheter placement was confirmed within the branch supplying the area of bleeding. Two 2 mm soft IDC Interlock coils were deployed within the feeding artery. Follow-up angiography demonstrated a small proximal branch  that could potentially be supplying the bleeder. A 2 mm low profile Ruby coil was deployed at this branch vessel. Final angiography was performed through the microcatheter. Microcatheter and 5 French catheter removed without complication. Angiogram was performed through the right groin sheath. The right groin sheath was removed using an Angio-Seal closure device. Right groin hemostasis at the end of the procedure. FINDINGS: 1. IVC was patent on the carbon dioxide venogram. Bilateral renal veins were cannulated with the Bentson wire. Filter was deployed below the renal veins. 2. SMA angiography demonstrates a patent SMA with subtle area of bleeding in the ascending colon near the hepatic flexure. This finding corresponded with the area of contrast extravasation on the recent CTA. Microcatheter was advanced into distal right colic artery branches and the area of bleeding was confirmed. Transverse branch supplying the area of bleeding was successfully cannulated and this branch and a small branch coming off it were embolized using 2 mm coils. No evidence for active bleeding following coil embolization. IMPRESSION: 1. Successful coil embolization of a distal right colic artery branch supplying the area of bleeding in the ascending colon near the hepatic flexure. 2. Placement of IVC filter for pulmonary emboli. IVC filter is potentially retrievable. Electronically Signed   By:  Richarda Overlie M.D.   On: 05/11/2020 17:42   IR IVC FILTER PLMT / S&I Lenise Arena GUID/MOD SED  Result Date: 05/11/2020 INDICATION: 87 year old with persistent lower GI bleeding. Recent CTA demonstrates an area of bleeding in the right colon. Small pulmonary emboli on recent CTA. Patient presents for arterial embolization and IVC filter placement. EXAM: 1. MESENTERIC ANGIOGRAPHY: SMA AND RIGHT COLIC BRANCHES 2. COIL EMBOLIZATION OF RIGHT COLIC ARTERY BRANCH 3. ULTRASOUND GUIDANCE FOR VASCULAR ACCESS 4. PLACEMENT OF IVC FILTER 5. SELECTIVE CANNULATION OF  BILATERAL RENAL VEINS MEDICATIONS: Moderate sedation ANESTHESIA/SEDATION: Moderate (conscious) sedation was employed during this procedure. A total of Versed 1.0 mg and Fentanyl 50 mcg was administered intravenously. Moderate Sedation Time: 66 minutes. The patient's level of consciousness and vital signs were monitored continuously by radiology nursing throughout the procedure under my direct supervision. CONTRAST:  60 mL Omnipaque 300 FLUOROSCOPY TIME:  Fluoroscopy Time: 19 minutes and 18 seconds COMPLICATIONS: None immediate. PROCEDURE: Informed consent was obtained from the patient following explanation of the procedure, risks, benefits and alternatives. The patient understands, agrees and consents for the procedure. All questions were addressed. A time out was performed prior to the initiation of the procedure. Right groin was prepped and draped in sterile fashion. Maximal barrier sterile technique was utilized including caps, mask, sterile gowns, sterile gloves, sterile drape, hand hygiene and skin antiseptic. Ultrasound confirmed a patent right common femoral vein and right common femoral artery. Ultrasound image saved for documentation. Skin was anesthetized with 1% lidocaine. Small incision was made. 21 gauge needle was directed into the right common femoral vein with ultrasound guidance. Micropuncture dilator set was easily advanced into the vein. Tract was dilated to accommodate the IVC filter sheath. IVC venogram was performed using carbon dioxide. Bilateral renal veins were cannulated with a C2 catheter and Bentson wire. IVC filter was deployed below the renal veins. The venous sheath was removed with manual compression. Another small incision was made. 21 gauge needle was directed into the right common femoral artery with ultrasound guidance. Micropuncture dilator set was placed. Five [place] vascular sheath was placed over a Bentson wire. A C2 catheter was used to cannulate the SMA. SMA arteriography  was performed. STC microcatheter was advanced into the right colic artery and additional angiography was performed. Area of bleeding in the ascending colon near the hepatic flexure was identified. Microcatheter was advanced into the feeding artery using a 0.014 Fathom wire. Catheter placement was confirmed within the branch supplying the area of bleeding. Two 2 mm soft IDC Interlock coils were deployed within the feeding artery. Follow-up angiography demonstrated a small proximal branch that could potentially be supplying the bleeder. A 2 mm low profile Ruby coil was deployed at this branch vessel. Final angiography was performed through the microcatheter. Microcatheter and 5 French catheter removed without complication. Angiogram was performed through the right groin sheath. The right groin sheath was removed using an Angio-Seal closure device. Right groin hemostasis at the end of the procedure. FINDINGS: 1. IVC was patent on the carbon dioxide venogram. Bilateral renal veins were cannulated with the Bentson wire. Filter was deployed below the renal veins. 2. SMA angiography demonstrates a patent SMA with subtle area of bleeding in the ascending colon near the hepatic flexure. This finding corresponded with the area of contrast extravasation on the recent CTA. Microcatheter was advanced into distal right colic artery branches and the area of bleeding was confirmed. Transverse branch supplying the area of bleeding was successfully cannulated and this branch and  a small branch coming off it were embolized using 2 mm coils. No evidence for active bleeding following coil embolization. IMPRESSION: 1. Successful coil embolization of a distal right colic artery branch supplying the area of bleeding in the ascending colon near the hepatic flexure. 2. Placement of IVC filter for pulmonary emboli. IVC filter is potentially retrievable. Electronically Signed   By: Richarda Overlie M.D.   On: 05/11/2020 17:42   IR US Guide Vasc  Access Right  Result Date: 05/11/2020 INDICATION: 87 year old with persistent lower GI bleeding. Recent CTA demonstrates an area of bleeding in the right colon. Small pulmonary emboli on recent CTA. Patient presents for arterial embolization and IVC filter placement. EXAM: 1. MESENTERIC ANGIOGRAPHY: SMA AND RIGHT COLIC BRANCHES 2. COIL EMBOLIZATION OF RIGHT COLIC ARTERY BRANCH 3. ULTRASOUND GUIDANCE FOR VASCULAR ACCESS 4. PLACEMENT OF IVC FILTER 5. SELECTIVE CANNULATION OF BILATERAL RENAL VEINS MEDICATIONS: Moderate sedation ANESTHESIA/SEDATION: Moderate (conscious) sedation was employed during this procedure. A total of Versed 1.0 mg and Fentanyl 50 mcg was administered intravenously. Moderate Sedation Time: 66 minutes. The patient's level of consciousness and vital signs were monitored continuously by radiology nursing throughout the procedure under my direct supervision. CONTRAST:  60 mL Omnipaque 300 FLUOROSCOPY TIME:  Fluoroscopy Time: 19 minutes and 18 seconds COMPLICATIONS: None immediate. PROCEDURE: Informed consent was obtained from the patient following explanation of the procedure, risks, benefits and alternatives. The patient understands, agrees and consents for the procedure. All questions were addressed. A time out was performed prior to the initiation of the procedure. Right groin was prepped and draped in sterile fashion. Maximal barrier sterile technique was utilized including caps, mask, sterile gowns, sterile gloves, sterile drape, hand hygiene and skin antiseptic. Ultrasound confirmed a patent right common femoral vein and right common femoral artery. Ultrasound image saved for documentation. Skin was anesthetized with 1% lidocaine. Small incision was made. 21 gauge needle was directed into the right common femoral vein with ultrasound guidance. Micropuncture dilator set was easily advanced into the vein. Tract was dilated to accommodate the IVC filter sheath. IVC venogram was performed using  carbon dioxide. Bilateral renal veins were cannulated with a C2 catheter and Bentson wire. IVC filter was deployed below the renal veins. The venous sheath was removed with manual compression. Another small incision was made. 21 gauge needle was directed into the right common femoral artery with ultrasound guidance. Micropuncture dilator set was placed. Five [place] vascular sheath was placed over a Bentson wire. A C2 catheter was used to cannulate the SMA. SMA arteriography was performed. STC microcatheter was advanced into the right colic artery and additional angiography was performed. Area of bleeding in the ascending colon near the hepatic flexure was identified. Microcatheter was advanced into the feeding artery using a 0.014 Fathom wire. Catheter placement was confirmed within the branch supplying the area of bleeding. Two 2 mm soft IDC Interlock coils were deployed within the feeding artery. Follow-up angiography demonstrated a small proximal branch that could potentially be supplying the bleeder. A 2 mm low profile Ruby coil was deployed at this branch vessel. Final angiography was performed through the microcatheter. Microcatheter and 5 French catheter removed without complication. Angiogram was performed through the right groin sheath. The right groin sheath was removed using an Angio-Seal closure device. Right groin hemostasis at the end of the procedure. FINDINGS: 1. IVC was patent on the carbon dioxide venogram. Bilateral renal veins were cannulated with the Bentson wire. Filter was deployed below the renal veins. 2. SMA angiography  demonstrates a patent SMA with subtle area of bleeding in the ascending colon near the hepatic flexure. This finding corresponded with the area of contrast extravasation on the recent CTA. Microcatheter was advanced into distal right colic artery branches and the area of bleeding was confirmed. Transverse branch supplying the area of bleeding was successfully cannulated and  this branch and a small branch coming off it were embolized using 2 mm coils. No evidence for active bleeding following coil embolization. IMPRESSION: 1. Successful coil embolization of a distal right colic artery branch supplying the area of bleeding in the ascending colon near the hepatic flexure. 2. Placement of IVC filter for pulmonary emboli. IVC filter is potentially retrievable. Electronically Signed   By: Richarda Overlie M.D.   On: 05/11/2020 17:42   IR EMBO ART  VEN HEMORR LYMPH EXTRAV  INC GUIDE ROADMAPPING  Result Date: 05/11/2020 INDICATION: 87 year old with persistent lower GI bleeding. Recent CTA demonstrates an area of bleeding in the right colon. Small pulmonary emboli on recent CTA. Patient presents for arterial embolization and IVC filter placement. EXAM: 1. MESENTERIC ANGIOGRAPHY: SMA AND RIGHT COLIC BRANCHES 2. COIL EMBOLIZATION OF RIGHT COLIC ARTERY BRANCH 3. ULTRASOUND GUIDANCE FOR VASCULAR ACCESS 4. PLACEMENT OF IVC FILTER 5. SELECTIVE CANNULATION OF BILATERAL RENAL VEINS MEDICATIONS: Moderate sedation ANESTHESIA/SEDATION: Moderate (conscious) sedation was employed during this procedure. A total of Versed 1.0 mg and Fentanyl 50 mcg was administered intravenously. Moderate Sedation Time: 66 minutes. The patient's level of consciousness and vital signs were monitored continuously by radiology nursing throughout the procedure under my direct supervision. CONTRAST:  60 mL Omnipaque 300 FLUOROSCOPY TIME:  Fluoroscopy Time: 19 minutes and 18 seconds COMPLICATIONS: None immediate. PROCEDURE: Informed consent was obtained from the patient following explanation of the procedure, risks, benefits and alternatives. The patient understands, agrees and consents for the procedure. All questions were addressed. A time out was performed prior to the initiation of the procedure. Right groin was prepped and draped in sterile fashion. Maximal barrier sterile technique was utilized including caps, mask, sterile  gowns, sterile gloves, sterile drape, hand hygiene and skin antiseptic. Ultrasound confirmed a patent right common femoral vein and right common femoral artery. Ultrasound image saved for documentation. Skin was anesthetized with 1% lidocaine. Small incision was made. 21 gauge needle was directed into the right common femoral vein with ultrasound guidance. Micropuncture dilator set was easily advanced into the vein. Tract was dilated to accommodate the IVC filter sheath. IVC venogram was performed using carbon dioxide. Bilateral renal veins were cannulated with a C2 catheter and Bentson wire. IVC filter was deployed below the renal veins. The venous sheath was removed with manual compression. Another small incision was made. 21 gauge needle was directed into the right common femoral artery with ultrasound guidance. Micropuncture dilator set was placed. Five [place] vascular sheath was placed over a Bentson wire. A C2 catheter was used to cannulate the SMA. SMA arteriography was performed. STC microcatheter was advanced into the right colic artery and additional angiography was performed. Area of bleeding in the ascending colon near the hepatic flexure was identified. Microcatheter was advanced into the feeding artery using a 0.014 Fathom wire. Catheter placement was confirmed within the branch supplying the area of bleeding. Two 2 mm soft IDC Interlock coils were deployed within the feeding artery. Follow-up angiography demonstrated a small proximal branch that could potentially be supplying the bleeder. A 2 mm low profile Ruby coil was deployed at this branch vessel. Final angiography was performed through the  microcatheter. Microcatheter and 5 French catheter removed without complication. Angiogram was performed through the right groin sheath. The right groin sheath was removed using an Angio-Seal closure device. Right groin hemostasis at the end of the procedure. FINDINGS: 1. IVC was patent on the carbon dioxide  venogram. Bilateral renal veins were cannulated with the Bentson wire. Filter was deployed below the renal veins. 2. SMA angiography demonstrates a patent SMA with subtle area of bleeding in the ascending colon near the hepatic flexure. This finding corresponded with the area of contrast extravasation on the recent CTA. Microcatheter was advanced into distal right colic artery branches and the area of bleeding was confirmed. Transverse branch supplying the area of bleeding was successfully cannulated and this branch and a small branch coming off it were embolized using 2 mm coils. No evidence for active bleeding following coil embolization. IMPRESSION: 1. Successful coil embolization of a distal right colic artery branch supplying the area of bleeding in the ascending colon near the hepatic flexure. 2. Placement of IVC filter for pulmonary emboli. IVC filter is potentially retrievable. Electronically Signed   By: Richarda Overlie M.D.   On: 05/11/2020 17:42   VAS [place] LOWER EXTREMITY VENOUS (DVT)  Result Date: 05/11/2020  Lower Venous DVT Study Patient Name:  Thomas Joseph  Date of Exam:   05/11/2020 Medical Rec #: [id number]          Accession #:    [id number] Date of Birth: 1933/03/12          Patient Gender: M Patient Age:   28Y Exam Location:  [facility] Procedure:      VAS [place] LOWER EXTREMITY VENOUS (DVT) Referring Phys: [id number] [facility] Marlyss Cissell --------------------------------------------------------------------------------  Indications: Pulmonary embolism.  Risk Factors: Patient had IVC filter placed today. GI bleed. Limitations: Patient unable to move legs secondary to needing to remain flat post procedure. and bandages. Comparison Study: No prior study on file Performing Technologist: Sherren Kerns RVS  Examination Guidelines: A complete evaluation includes B-mode imaging, spectral Doppler, color Doppler, and power Doppler as needed of all accessible portions of each vessel. Bilateral testing is  considered an integral part of a complete examination. Limited examinations for reoccurring indications may be performed as noted. The reflux portion of the exam is performed with the patient in reverse Trendelenburg.  +---------+---------------+---------+-----------+----------+-------------------+ RIGHT    CompressibilityPhasicitySpontaneityPropertiesThrombus Aging      +---------+---------------+---------+-----------+----------+-------------------+ CFV                     Yes      Yes                  patent by color and                                                       Doppler             +---------+---------------+---------+-----------+----------+-------------------+ SFJ                                                   bandage in way      +---------+---------------+---------+-----------+----------+-------------------+ FV Prox  Full                                                             +---------+---------------+---------+-----------+----------+-------------------+  FV Mid   Full                                                             +---------+---------------+---------+-----------+----------+-------------------+ FV DistalFull                                                             +---------+---------------+---------+-----------+----------+-------------------+ PFV      Full                                                             +---------+---------------+---------+-----------+----------+-------------------+ POP                     Yes      Yes                  patent by color and                                                       Doppler             +---------+---------------+---------+-----------+----------+-------------------+ PTV      Full                                                             +---------+---------------+---------+-----------+----------+-------------------+ PERO     None                                          Age Indeterminate   +---------+---------------+---------+-----------+----------+-------------------+   +---------+---------------+---------+-----------+----------+-------------------+ LEFT     CompressibilityPhasicitySpontaneityPropertiesThrombus Aging      +---------+---------------+---------+-----------+----------+-------------------+ CFV      Full           Yes      Yes                                      +---------+---------------+---------+-----------+----------+-------------------+ SFJ      Full                                                             +---------+---------------+---------+-----------+----------+-------------------+ FV Prox  Full                                                             +---------+---------------+---------+-----------+----------+-------------------+  FV Mid   Full                                                             +---------+---------------+---------+-----------+----------+-------------------+ FV DistalFull                                                             +---------+---------------+---------+-----------+----------+-------------------+ PFV      Full                                                             +---------+---------------+---------+-----------+----------+-------------------+ POP                     Yes      Yes                  patent by color and                                                       Doppler             +---------+---------------+---------+-----------+----------+-------------------+ PTV      None                                         Age Indeterminate   +---------+---------------+---------+-----------+----------+-------------------+ PERO     None                                         Age Indeterminate   +---------+---------------+---------+-----------+----------+-------------------+    Summary: RIGHT: - Findings  consistent with age indeterminate deep vein thrombosis involving the right peroneal veins.  LEFT: - Findings consistent with age indeterminate deep vein thrombosis involving the left posterior tibial veins, and left peroneal veins.  *See table(s) above for measurements and observations.    Preliminary    CT Angio Abd/Pel w/ and/or w/o  Addendum Date: 05/11/2020   ADDENDUM REPORT: 05/11/2020 15:00 ADDENDUM: Patient is also noted to have a new filling defect involving a subsegmental right lower lobe pulmonary artery (image 14/series 5) consistent with acute pulmonary embolus. Electronically Signed   By: Kennith Center M.D.   On: 05/11/2020 15:00   Addendum Date: 05/11/2020   ADDENDUM REPORT: 05/11/2020 13:42 ADDENDUM: After speaking with Dr. Lowella Dandy, there is a focus of contrast extravasation in the ascending colon near the hepatic flexure associated with a diverticulum. This is compatible with active extravasation in the right colon. Electronically Signed   By: Kennith Center M.D.   On: 05/11/2020 13:42   Result Date: 05/11/2020 CLINICAL DATA:  GI bleed.  EXAM: CTA ABDOMEN AND PELVIS WITHOUT AND WITH CONTRAST TECHNIQUE: Multidetector CT imaging of the abdomen and pelvis was performed using the standard protocol during bolus administration of intravenous contrast. Multiplanar reconstructed images and MIPs were obtained and reviewed to evaluate the vascular anatomy. CONTRAST:  75mL OMNIPAQUE IOHEXOL 300 MG/ML  SOLN COMPARISON:  05/08/2020 FINDINGS: VASCULAR Aorta: Normal caliber aorta without aneurysm, dissection, vasculitis or significant stenosis. Celiac: Patent without evidence of aneurysm, dissection, vasculitis or significant stenosis. SMA: Patent without evidence of aneurysm, dissection, vasculitis or significant stenosis. Renals: Both renal arteries are patent without evidence of aneurysm, dissection, vasculitis, fibromuscular dysplasia or significant stenosis. Accessory renal arteries noted bilaterally. IMA:  Patent without evidence of aneurysm, dissection, vasculitis or significant stenosis. Inflow: Patent without evidence of aneurysm, dissection, vasculitis or significant stenosis. Proximal Outflow: Bilateral common femoral and visualized portions of the superficial and profunda femoral arteries are patent without evidence of aneurysm, dissection, vasculitis or significant stenosis. Veins: No obvious venous abnormality within the limitations of this arterial phase study. Review of the MIP images confirms the above findings. NON-VASCULAR Lower chest: Mild bronchiectasis noted in the lower lobes bilaterally. Hepatobiliary: Multiple scattered small hepatic cysts with additional tiny hypodense lesions in the liver too small to characterize. Subtle nodular contour raises the question of cirrhosis. There is no evidence for gallstones, gallbladder wall thickening, or pericholecystic fluid. No intrahepatic or extrahepatic biliary dilation. Pancreas: 2.4 cm subtle heterogeneous hypoattenuating lesion in the uncinate process of the pancreas is similar to recent study but progressive compared to 12/17/2017 when it measured approximately 1.8 cm. The relatively slow progression suggests benign etiology. Spleen: No splenomegaly. No focal mass lesion. Adrenals/Urinary Tract: No adrenal nodule or mass. Stable exophytic large right renal cyst with additional scattered small low-density renal lesions, also most likely benign. No evidence for hydroureter. The urinary bladder appears normal for the degree of distention. Stomach/Bowel: Tiny hiatal hernia. Stomach otherwise unremarkable. No bowel dilatation. Extensive colonic diverticular change noted. No evidence for contrast extravasation into bowel on arterial or portal venous phase imaging to suggest active bleeding. Lymphatic: There is no gastrohepatic or hepatoduodenal ligament lymphadenopathy. No retroperitoneal or mesenteric lymphadenopathy. No pelvic sidewall lymphadenopathy.  Reproductive: The prostate gland and seminal vesicles are unremarkable. Other: No intraperitoneal free fluid. Musculoskeletal: No worrisome lytic or sclerotic osseous abnormality. IMPRESSION: VASCULAR 1. No findings to suggest active GI bleeding. 2. Aortic atherosclerosis without aneurysm. NON-VASCULAR 1. 2.4 cm subtle hypoattenuating lesion in the uncinate process of the pancreas, slightly progressive since 2019. Likely benign. Abdominal MRI with and without contrast could be used to further evaluate as clinically warranted. 2. Subtle nodularity of hepatic contour raises the question of cirrhosis. 3. Hepatic and renal cysts. 4. Left colonic diverticulosis without findings of diverticulitis. Electronically Signed: By: Kennith Center M.D. On: 05/11/2020 12:37     Scheduled Meds: . Chlorhexidine Gluconate Cloth  6 each Topical Daily  . insulin aspart  0-9 Units Subcutaneous TID WC  . latanoprost  1 drop Both Eyes QHS  . pantoprazole  40 mg Intravenous Q12H  . predniSONE  2.5 mg Oral Daily  . sodium chloride flush  10-40 mL Intracatheter Q12H  . sodium chloride flush  10-40 mL Intracatheter Q12H   Continuous Infusions: . sodium chloride    . lactated ringers 20 mL/hr at 05/11/20 0845     LOS: 7 days   Time spent: 20  Rhetta Mura, MD [facility] To contact the attending provider between 7A-7P or the covering provider during after hours 7P-7A,  please log into the web site [email or web address] and access using universal [place] password for that web site. If you do not have the password, please call the hospital operator.  05/12/2020, 9:55 AM

## 2020-05-12 NOTE — Progress Notes (Signed)
[facility] Gastroenterology Progress Note  Thomas Joseph 87 y.o. 07-Mar-1933  CC: GI bleed   Subjective: Patient seen and examined at bedside.  Events from this weekend noted.  Had another colonoscopy without revealing active source of bleeding.  Follow-up CTA was positive and underwent IR guided embolization of right colic artery.  Was also diagnosed with pulmonary embolism and had IVC filter placed.  No bowel movements today.  Denies abdominal pain, nausea and vomiting.  ROS : Afebrile, negative for chest pain.   Objective: Vital signs in last 24 hours: Vitals:   05/11/20 2300 05/12/20 0800  BP: (!) 169/48 (!) 141/52  Pulse: 81 75  Resp: 20 20  Temp: (!) 97.4 F (36.3 C) 97.8 F (36.6 C)  SpO2: 100% 99%    Physical Exam:  General:  Alert, cooperative, no distress, appears stated age  Head:  Normocephalic, without obvious abnormality, atraumatic  Eyes:  , EOM's intact,   Lungs:    No visible respiratory distress  Heart:  Regular rate and rhythm, S1, S2 normal  Abdomen:   Soft, non-tender, mild distended, bowel sounds present.  No peritoneal signs  Extremities: Extremities normal, atraumatic, no  edema  Pulses: 2+ and symmetric    Lab Results: Recent Labs    05/10/20 2310 05/11/20 0545 05/12/20 0200  NA  --  138 137  K  --  3.6 3.7  CL  --  109 109  CO2  --  21* 23  GLUCOSE  --  186* 191*  BUN  --  43* 38*  CREATININE  --  1.76* 1.75*  CALCIUM  --  7.2* 7.1*  MG 1.5*  --   --    Recent Labs    05/11/20 0545 05/12/20 0200  AST 20 16  ALT 13 12  ALKPHOS 38 33*  BILITOT 0.5 0.7  PROT 3.9* 3.4*  ALBUMIN 2.2* 2.1*   Recent Labs    05/10/20 0112 05/10/20 1621 05/11/20 1819 05/12/20 0200  WBC 14.1*   < > 12.6* 12.8*  NEUTROABS 11.6*  --   --   --   HGB 6.3*   < > 6.3* 7.4*  HCT 18.7*   < > 18.6* 21.9*  MCV 92.6   < > 89.4 88.3  PLT 109*   < > 88* 86*   < > = values in this interval not displayed.   No results for input(s): LABPROT, INR in the  last 72 hours.    Assessment/Plan: - Lower GI bleed.  EGD 4/26 negative for bleeding.  Colonoscopy 04/27  showed poor prep, coffee-ground/old blood in the colon.  Multiple diverticulosis.  No evidence of active bleeding.  Follow-up bleeding scan positive for active bleeding probably in the ascending colon. CT angio 04/28 negative for active bleeding.  Had another colonoscopy on 05/01 without revealing active source of bleeding.  Follow-up CTA was positive and underwent IR guided embolization of right colic artery.  Was also diagnosed with pulmonary embolism and had IVC filter placed.  -Acute blood loss anemia.  Status post multiple units of blood transfusion -Pulmonary embolism.  S/p IVC filter placement  Recommendations ------------------------ -Continue supportive care. -Advance diet to soft -Monitor H&H.  Transfuse if hemoglobin less than 7 -Risk of recurrent bleeding from other diverticuli in the future discussed with the patient.  Avoid NSAIDs.  High-fiber diet. -GI will follow  Kathi Der MD, FACP 05/12/2020, 8:42 AM  Contact #  [phone]

## 2020-05-12 NOTE — Plan of Care (Signed)

## 2020-05-13 ENCOUNTER — Inpatient Hospital Stay (HOSPITAL_COMMUNITY): Payer: Medicare Other

## 2020-05-13 DIAGNOSIS — K922 Gastrointestinal hemorrhage, unspecified: Secondary | ICD-10-CM

## 2020-05-13 LAB — CBC
HCT: 22.8 % — ABNORMAL LOW (ref 39.0–52.0)
Hemoglobin: 7.7 g/dL — ABNORMAL LOW (ref 13.0–17.0)
MCH: 30 pg (ref 26.0–34.0)
MCHC: 33.8 g/dL (ref 30.0–36.0)
MCV: 88.7 fL (ref 80.0–100.0)
Platelets: 106 10*3/uL — ABNORMAL LOW (ref 150–400)
RBC: 2.57 MIL/uL — ABNORMAL LOW (ref 4.22–5.81)
RDW: 17.2 % — ABNORMAL HIGH (ref 11.5–15.5)
WBC: 10.7 10*3/uL — ABNORMAL HIGH (ref 4.0–10.5)
nRBC: 0.6 % — ABNORMAL HIGH (ref 0.0–0.2)

## 2020-05-13 LAB — CBC WITH DIFFERENTIAL/PLATELET
Abs Immature Granulocytes: 0.1 10*3/uL — ABNORMAL HIGH (ref 0.00–0.07)
Basophils Absolute: 0 10*3/uL (ref 0.0–0.1)
Basophils Relative: 0 %
Eosinophils Absolute: 0.3 10*3/uL (ref 0.0–0.5)
Eosinophils Relative: 3 %
HCT: 18.9 % — ABNORMAL LOW (ref 39.0–52.0)
Hemoglobin: 6.2 g/dL — CL (ref 13.0–17.0)
Immature Granulocytes: 1 %
Lymphocytes Relative: 10 %
Lymphs Abs: 1 10*3/uL (ref 0.7–4.0)
MCH: 29.7 pg (ref 26.0–34.0)
MCHC: 32.8 g/dL (ref 30.0–36.0)
MCV: 90.4 fL (ref 80.0–100.0)
Monocytes Absolute: 0.8 10*3/uL (ref 0.1–1.0)
Monocytes Relative: 8 %
Neutro Abs: 8.2 10*3/uL — ABNORMAL HIGH (ref 1.7–7.7)
Neutrophils Relative %: 78 %
Platelets: 93 10*3/uL — ABNORMAL LOW (ref 150–400)
RBC: 2.09 MIL/uL — ABNORMAL LOW (ref 4.22–5.81)
RDW: 17.6 % — ABNORMAL HIGH (ref 11.5–15.5)
WBC: 10.5 10*3/uL (ref 4.0–10.5)
nRBC: 1.9 % — ABNORMAL HIGH (ref 0.0–0.2)

## 2020-05-13 LAB — COMPREHENSIVE METABOLIC PANEL
ALT: 11 U/L (ref 0–44)
AST: 18 U/L (ref 15–41)
Albumin: 2 g/dL — ABNORMAL LOW (ref 3.5–5.0)
Alkaline Phosphatase: 34 U/L — ABNORMAL LOW (ref 38–126)
Anion gap: 6 (ref 5–15)
BUN: 34 mg/dL — ABNORMAL HIGH (ref 8–23)
CO2: 23 mmol/L (ref 22–32)
Calcium: 7.4 mg/dL — ABNORMAL LOW (ref 8.9–10.3)
Chloride: 108 mmol/L (ref 98–111)
Creatinine, Ser: 1.78 mg/dL — ABNORMAL HIGH (ref 0.61–1.24)
GFR, Estimated: 37 mL/min — ABNORMAL LOW (ref >60)
Glucose, Bld: 152 mg/dL — ABNORMAL HIGH (ref 70–99)
Potassium: 3.5 mmol/L (ref 3.5–5.1)
Sodium: 137 mmol/L (ref 135–145)
Total Bilirubin: 0.7 mg/dL (ref 0.3–1.2)
Total Protein: 3.5 g/dL — ABNORMAL LOW (ref 6.5–8.1)

## 2020-05-13 LAB — GLUCOSE, CAPILLARY
Glucose-Capillary: 151 mg/dL — ABNORMAL HIGH (ref 70–99)
Glucose-Capillary: 182 mg/dL — ABNORMAL HIGH (ref 70–99)
Glucose-Capillary: 192 mg/dL — ABNORMAL HIGH (ref 70–99)
Glucose-Capillary: 158 mg/dL — ABNORMAL HIGH (ref 70–99)

## 2020-05-13 LAB — ECHOCARDIOGRAM COMPLETE
AR max vel: 3.32 cm2
AV Area VTI: 3.43 cm2
AV Area mean vel: 3.08 cm2
AV Mean grad: 3 mmHg
AV Peak grad: 5.8 mmHg
Ao pk vel: 1.2 m/s
Height: 73 in
S' Lateral: 2.2 cm
Weight: 3597.91 oz

## 2020-05-13 LAB — HEMOGLOBIN AND HEMATOCRIT, BLOOD
HCT: 22.5 % — ABNORMAL LOW (ref 39.0–52.0)
Hemoglobin: 7.5 g/dL — ABNORMAL LOW (ref 13.0–17.0)

## 2020-05-13 LAB — PREPARE RBC (CROSSMATCH)

## 2020-05-13 MED ORDER — IOHEXOL 350 MG/ML SOLN
75.0000 mL | Freq: Once | INTRAVENOUS | Status: AC | PRN
  Administered 2020-05-13: 75 mL via INTRAVENOUS

## 2020-05-13 MED ORDER — SODIUM CHLORIDE 0.9% IV SOLUTION: Freq: Once | INTRAVENOUS | Status: AC

## 2020-05-13 NOTE — Progress Notes (Signed)
Pt doing well Pt states he has some "rumbling "in the stomach.  Patient states that he has had no bowel movement.  Patient with some pressure to the left lower quadrant area.  States he feels like it is gas.  On exam: Vital signs stable Abdomen soft, nontender, minimally distended, no rebound or guarding   Continue to observe Patient with follow-up H&H We will continue to monitor

## 2020-05-13 NOTE — Consult Note (Signed)
NAME:  Thomas Joseph, MRN:  [id number], DOB:  [birth year], LOS: 8 ADMISSION DATE:  05/05/2020, CONSULTATION DATE: 5/3 REFERRING MD: Dr. Mahala Menghini, CHIEF COMPLAINT: GI bleed  History of Present Illness:  87 year old male with past medical history as below, which is significant for chronic kidney disease, diabetes mellitus, hypertension, and anemia.  He was admitted to [facility] after new onset melena on 4/25 and hemoglobin dropped 12.7 and 9.6.  Transfuse 1 unit PRBC at that time.  Gastroenterology was consulted at which point patient underwent EGD with no obvious source of bleeding.  He underwent colonoscopy on 4/27 which was concerning for lower GI bleed.  He had a bleeding scan and IR was consulted demonstrating bleeding in the ascending colon.  He underwent mesenteric angiography and coiling on 5/1.  Over the course of his hospitalization he received 12 units of PRBC.  Course then complicated on 5/3 by hemoglobin drop to 6.2 requiring additional transfusion.  Patient also complained of abdominal distention.  Pertinent  Medical History   has a past medical history of Anemia, BPH (benign prostatic hyperplasia), Chronic kidney disease (CKD) stage G3a/A1, moderately decreased glomerular filtration rate (GFR) between 45-59 mL/min/1.73 square meter and albuminuria creatinine ratio less than 30 mg/g (HCC) (03/07/2020), Diabetes mellitus without complication (HCC), ED (erectile dysfunction), Family history of adverse reaction to anesthesia, Hypertension, Obesity, and Pneumonia.   Significant Hospital Events: Including procedures, antibiotic start and stop dates in addition to other pertinent events   4/25 for GI bleed 4/26 negative endoscopy 4/27 colonoscopy with blood 5/1 IR embolization 5/3 ongoing bleeding  Interim History / Subjective:  No complaints  Objective   Blood pressure (!) 160/51, pulse 72, temperature 98 F (36.7 C), temperature source Oral, resp. rate 15, height 6\' 1"   (1.854 m), weight 102 kg, SpO2 100 %.        Intake/Output Summary (Last 24 hours) at 05/13/2020 1855 Last data filed at 05/13/2020 1800 Gross per 24 hour  Intake 1035 ml  Output 1000 ml  Net 35 ml   Filed Weights   05/05/20 1022 05/07/20 0831 05/11/20 0820  Weight: 102 kg 102 kg 102 kg    Examination: General: Elderly male in no acute distress HENT: Normocephalic, atraumatic, no JVD Lungs: Clear, no distress Cardiovascular: Regular rate and rhythm, no MRG Abdomen: Abdomen soft, nontender.  Mild to moderate distention, tympanic percussion tones. Extremities: No acute deformity Neuro: Alert oriented nonfocal  Labs/imaging that I have personally reviewed  (right click and "Reselect all SmartList Selections" daily)   CTA abdomen no hemorrhagic complication evident after recent arteriography and embolization of a right colonic diverticular arterial bleed.  Although there is some higher density material in the colon on the current study which limits evaluation for intraluminal bleeding, there is no visible change in appearance of intraluminal material on arterial and venous phases of imaging to suggest obvious active gastrointestinal bleeding currently.   Resolved Hospital Problem list     Assessment & Plan:  Lower GI bleed: Status post IR embolization on 5/1.  Now on 5/3 he has repeated drop in his hemoglobin to the sixes and has been transfused.  PCCM consulted due to abdominal distention and recurrent bleed.  General surgery has also been consulted and describes neck step of surgical intervention should he decompensate and bleed further.  Abdomen is nontender on exam and there seems to be gaseous distention which appears to be intraluminal on CT of the abdomen.  - Continue management as you are. -  Serial CBC. - If any recurrent bleeding with associated hemodynamic instability PCCM will transfer the patient to the intensive care unit as well as reinvolve general surgery.    Labs    CBC: Recent Labs  Lab 05/10/20 0112 05/10/20 1621 05/11/20 0545 05/11/20 1819 05/12/20 0200 05/13/20 0520 05/13/20 1600  WBC 14.1*  --  18.3* 12.6* 12.8* 10.5  --   NEUTROABS 11.6*  --   --   --   --  8.2*  --   HGB 6.3*   < > 7.0* 6.3* 7.4* 6.2* 7.5*  HCT 18.7*   < > 21.1* 18.6* 21.9* 18.9* 22.5*  MCV 92.6  --  89.8 89.4 88.3 90.4  --   PLT 109*  --  111* 88* 86* 93*  --    < > = values in this interval not displayed.    Basic Metabolic Panel: Recent Labs  Lab 05/08/20 0600 05/10/20 0112 05/10/20 2310 05/11/20 0545 05/12/20 0200 05/13/20 0520  NA 140 140  --  138 137 137  K 4.0 4.1  --  3.6 3.7 3.5  CL 112* 111  --  109 109 108  CO2 20* 20*  --  21* 23 23  GLUCOSE 202* 175*  --  186* 191* 152*  BUN 40* 37*  --  43* 38* 34*  CREATININE 1.71* 1.65*  --  1.76* 1.75* 1.78*  CALCIUM 7.3* 7.6*  --  7.2* 7.1* 7.4*  MG  --   --  1.5*  --   --   --    GFR: Estimated Creatinine Clearance: 38.8 mL/min (A) (by C-G formula based on SCr of 1.78 mg/dL (H)). Recent Labs  Lab 05/11/20 0545 05/11/20 1819 05/12/20 0200 05/13/20 0520  WBC 18.3* 12.6* 12.8* 10.5    Liver Function Tests: Recent Labs  Lab 05/08/20 0600 05/11/20 0545 05/12/20 0200 05/13/20 0520  AST 20 20 16 18   ALT 11 13 12 11   ALKPHOS 36* 38 33* 34*  BILITOT 0.4 0.5 0.7 0.7  PROT 4.2* 3.9* 3.4* 3.5*  ALBUMIN 2.3* 2.2* 2.1* 2.0*   No results for input(s): LIPASE, AMYLASE in the last 168 hours. No results for input(s): AMMONIA in the last 168 hours.  ABG    Component Value Date/Time   TCO2 26 09/24/2017 2048     Coagulation Profile: No results for input(s): INR, PROTIME in the last 168 hours.  Cardiac Enzymes: No results for input(s): CKTOTAL, CKMB, CKMBINDEX, TROPONINI in the last 168 hours.  HbA1C: Hgb A1c MFr Bld  Date/Time Value Ref Range Status  05/05/2020 03:39 PM 7.2 (H) 4.8 - 5.6 % Final    Comment:    (NOTE) Pre diabetes:          5.7%-6.4%  Diabetes:               >6.4%  Glycemic control for   <7.0% adults with diabetes   02/02/2019 07:10 PM 7.0 (H) 4.8 - 5.6 % Final    Comment:    (NOTE) Pre diabetes:          5.7%-6.4% Diabetes:              >6.4% Glycemic control for   <7.0% adults with diabetes     CBG: Recent Labs  Lab 05/12/20 1700 05/12/20 2105 05/13/20 0541 05/13/20 1114 05/13/20 1602  GLUCAP 197* 208* 151* 182* 192*    Review of Systems:   Bolds are positive  Constitutional: weight loss, gain, night sweats, Fevers, chills, fatigue .  HEENT: headaches, Sore throat, sneezing, nasal congestion, post nasal drip, Difficulty swallowing, Tooth/dental problems, visual complaints visual changes, ear ache CV:  chest pain, radiates:,Orthopnea, PND, swelling in lower extremities, dizziness, palpitations, syncope.  GI  heartburn, indigestion, abdominal pain, nausea, vomiting, diarrhea, change in bowel habits, loss of appetite, bloody stools.  Resp: cough, productive: , hemoptysis, dyspnea, chest pain, pleuritic.  Skin: rash or itching or icterus GU: dysuria, change in color of urine, urgency or frequency. flank pain, hematuria  MS: joint pain or swelling. decreased range of motion  Psych: change in mood or affect. depression or anxiety.  Neuro: difficulty with speech, weakness, numbness, ataxia    Past Medical History:  He,  has a past medical history of Anemia, BPH (benign prostatic hyperplasia), Chronic kidney disease (CKD) stage G3a/A1, moderately decreased glomerular filtration rate (GFR) between 45-59 mL/min/1.73 square meter and albuminuria creatinine ratio less than 30 mg/g (HCC) (03/07/2020), Diabetes mellitus without complication (HCC), ED (erectile dysfunction), Family history of adverse reaction to anesthesia, Hypertension, Obesity, and Pneumonia.   Surgical History:   Past Surgical History:  Procedure Laterality Date  .  LEFT KNEE ARTHROCOPY    . COLONOSCOPY WITH PROPOFOL Left 05/07/2020   Procedure: COLONOSCOPY WITH  PROPOFOL;  Surgeon: Kathi Der, MD;  Location: [facility] ENDOSCOPY;  Service: Gastroenterology;  Laterality: Left;  . COLONOSCOPY WITH PROPOFOL N/A 05/11/2020   Procedure: COLONOSCOPY WITH PROPOFOL;  Surgeon: Charlott Rakes, MD;  Location: [facility] ENDOSCOPY;  Service: Endoscopy;  Laterality: N/A;  . ESOPHAGOGASTRODUODENOSCOPY N/A 05/06/2020   Procedure: ESOPHAGOGASTRODUODENOSCOPY (EGD);  Surgeon: Willis Modena, MD;  Location: [facility] ENDOSCOPY;  Service: Endoscopy;  Laterality: N/A;  . HERNIA REPAIR    . IR ANGIOGRAM SELECTIVE EACH ADDITIONAL VESSEL  05/11/2020  . IR ANGIOGRAM VISCERAL SELECTIVE  05/11/2020  . IR EMBO ART  VEN HEMORR LYMPH EXTRAV  INC GUIDE ROADMAPPING  05/11/2020  . IR IVC FILTER PLMT / S&I /IMG GUID/MOD SED  05/11/2020  . IR US GUIDE VASC ACCESS RIGHT  05/11/2020  . IR VENOGRAM RENAL BI  05/11/2020  . TRANSURETHRAL RESECTION OF PROSTATE N/A 12/26/2014   Procedure: TRANSURETHRAL RESECTION OF THE PROSTATE;  Surgeon: Malen Gauze, MD;  Location: [facility] ORS;  Service: Urology;  Laterality: N/A;     Social History:   reports that he quit smoking about 35 years ago. He has never used smokeless tobacco. He reports previous alcohol use. He reports that he does not use drugs.   Family History:  His family history includes CVA in his father; Cancer in his brother; Hypertension in his father; Leukemia in his sister; Thyroid disease in his sister.   Allergies Allergies  Allergen Reactions  . Hydrochlorothiazide Other (See Comments)    Reaction not recalled, believes it just made him urinate very frequently  . Lipitor [Atorvastatin] Other (See Comments)    Muscle pain  . Tizanidine Hcl Other (See Comments)  . Viagra [Sildenafil Citrate] Palpitations     Home Medications  Prior to Admission medications   Medication Sig Start Date End Date Taking? Authorizing Provider  acetaminophen (TYLENOL) 325 MG tablet Take 325-650 mg by mouth every 12 (twelve) hours as needed for mild pain or headache.    Yes [provider]  amLODipine (NORVASC) 10 MG tablet Take 10 mg by mouth daily.   Yes [provider]  Ascorbic Acid (VITAMIN C) 500 MG CAPS Take 1 tablet by mouth daily. 02/08/19  Yes [provider]  aspirin 81 MG chewable tablet Chew 81 mg by mouth  every other day.    Yes [provider]  Blood Pressure Monitor DEVI  10/23/19  Yes [provider]  cetirizine (ZYRTEC) 10 MG tablet Take 10 mg by mouth daily as needed for allergies.   Yes [provider]  hydrocortisone (ANUSOL-HC) 25 MG suppository Place 1 suppository (25 mg total) rectally 2 (two) times daily. 04/15/19  Yes Yu, Amy V, PA-C  latanoprost (XALATAN) 0.005 % ophthalmic solution Place 1 drop into both eyes at bedtime. 01/19/19  Yes [provider]  lisinopril-hydrochlorothiazide (ZESTORETIC) 20-12.5 MG tablet Take 1 tablet by mouth daily. 02/15/19  Yes [provider]  meloxicam (MOBIC) 15 MG tablet Take 15 mg by mouth daily as needed. 03/28/19  Yes [provider]  metFORMIN (GLUCOPHAGE-XR) 500 MG 24 hr tablet Take 1,000 mg by mouth daily.    Yes [provider]  predniSONE (DELTASONE) 10 MG tablet Take 10 mg by mouth daily. 04/07/20  Yes [provider]  rosuvastatin (CRESTOR) 5 MG tablet Take 5 mg by mouth daily. 08/26/19  Yes [provider]  tadalafil (CIALIS) 20 MG tablet Take 20 mg by mouth daily as needed for erectile dysfunction. 08/03/19  Yes [provider]  zinc sulfate 220 (50 Zn) MG capsule Take 220 mg by mouth daily. 02/08/19  Yes [provider]  benazepril (LOTENSIN) 40 MG tablet Take 40 mg by mouth daily.  02/02/19  [provider]  zolpidem (AMBIEN) 10 MG tablet Take 10 mg by mouth at bedtime as needed for sleep.   02/02/19  [provider]      Joneen RoachPaul Eylin Pontarelli, AGACNP-BC [facility]  See Amion for personal pager PCCM on call pager [phone] until  7pm. Please call Elink 7p-7a. [phone]  05/13/2020 7:10 PM

## 2020-05-13 NOTE — Progress Notes (Signed)
PROGRESS NOTE   Thomas BraceRobert L Joseph  ZOX:[id number] DOB: 07-05-1933 DOA: 05/05/2020 PCP: [name], Sharon, MD  Brief Narrative:   [id number] black male admitted from home HTN, HLD, DM TY 2 COVID-19 positive 02/08/2019 BPH status post TURP 12/26/2014  New onset melena 05/05/2020-hemoglobin 12.7-->9.6 [facility] Patient transfused 1 unit PRBC Patient found to have azotemia GI consulted EGD no source of bleeding 4/26 Colonoscopy unrevealing 4/27 secondary to poor prep however blood found in colonoscopy therefore bleeding scan performed and mild positive IR consulted--nuclear bleeding scan performed showing bleeding ascending colon Multiphase CT subsequently did not reveal a source  Patient had brisk bleeding underwent mesenteric angiography and coiling 5/1 He has dropped his hemoglobin again 5/3 has been transfused 1 more unit and general surgery has been consulted with regards to elective hemicolectomy to me versus total abdominal colectomy and end ileostomy  Hospital-Problem based course  Lower GI bleeding ascending colon Embolization right colic artery 5/1 Dr. Lowella DandyHenn Hemodynamics stable despite need for 12 U blood transfusions Colonoscopy 5/1 = blood in colon Status post coil embolization right colic artery Dr. Lowella DandyHenn Recurrent bleeding 5/3 N.p.o. after midnight in case needs surgery General surgery consulted?  Hemicolectomy versus total colectomy plus and ileostomy if continues to bleed Family is aware Paroxysmal A. fib Occurred in the setting of large-volume GI bleed-keep on monitors Is predominantly in sinus rhythm at this time Compensated HFpEF-EF 70 to 75% this admission severe left ventricular concentric hypertrophy Mild circumferential pericardial effusion pulmonary hypertension Monitor trends ?  Lower extremity DVT-small pulmonary embolism on imaging Not a candidate for anticoagulation given massive bleed-subrenal artery IVC filter placed 5/1 Lower  extremity duplex + DVT 5/1--- hold chest CT given azotemia Hematoma left arm at midline site Extravasation as well as hematoma on left arm--midline removed PICC line in place on right hand Leukocytosis secondary to stress of GI bleed Etiology unclear--has been on steroids prior to admission-no further work-up DM TY 2 Continue sliding scale alone  Holding metformin 1000 daily AKi on admit Mild metabolic acidosis probably from reequilibrated Azotemia resolved--monitor trends--hold diuretics and ACE HTN At this time hold amlodipine 10 Zestoretic 1  DVT prophylaxis:  SCD  code Status: Full Family Communication: Discussed with patient's wife several days in a row with regards to plans Glenda ChromanWinstead,Mattie Spouse [phone]  [phone]  She is aware of need for possible surgery  Disposition:  Status is: Inpatient  Remains inpatient appropriate because:Ongoing diagnostic testing needed not appropriate for outpatient work up and Unsafe d/c plan  Dispo: The patient is from: Home              Anticipated d/c is to: Home              Patient currently is not medically stable to d/c.   Difficult to place patient No    Consultants:   Gastroenterology  Interventional radiology  Procedures:  Upper endoscopy 4/26 Colonoscopy 4/27 Bleeding scan 4/27 Multiphase CT 4/28 showing no bleeding Mesenteric arteriogram/IVC filter 5/1 1) IVC filter placed below renal veins 2) Active bleeding from right colic artery branch.  Successful coil embolization of feeding branch to bleeding site. 3) Right groin closure with Angioseal device.  Lower extremity duplex RIGHT:  - Findings consistent with age indeterminate deep vein thrombosis  involving the right peroneal veins.     LEFT:  - Findings consistent with age indeterminate deep vein thrombosis  involving the left posterior tibial veins, and left peroneal veins.      Echocardiogram  5/1 =  1. Left ventricular ejection fraction, by  estimation, is 70 to 75%. The  left ventricle has hyperdynamic function. The left ventricle has no  regional wall motion abnormalities. There is severe concentric left  ventricular hypertrophy. Left ventricular  diastolic parameters are consistent with Grade I diastolic dysfunction  (impaired relaxation).  2. Right ventricular systolic function is normal. The right ventricular  size is normal. There is moderately elevated pulmonary artery systolic  pressure. The estimated right ventricular systolic pressure is 47.4 mmHg.  3. The pericardial effusion is circumferential. There is no evidence of  cardiac tamponade.  4. The mitral valve is normal in structure. No evidence of mitral valve  regurgitation. No evidence of mitral stenosis.  5. Tricuspid valve regurgitation is mild to moderate.  6. The aortic valve is tricuspid. There is mild calcification of the  aortic valve. There is mild thickening of the aortic valve. Aortic valve  regurgitation is not visualized. Mild aortic valve sclerosis is present,  with no evidence of aortic valve  stenosis.  7. The inferior vena cava is normal in size with greater than 50%  respiratory variability, suggesting right atrial pressure of 3 mmHg.   Antimicrobials: None   Subjective:  Does not feel well today He was more interactive yesterday No chest pain no fever some discomfort in his abdomen He is quite swollen both extremities today    Objective: Vitals:   05/13/20 0800 05/13/20 1024 05/13/20 1039 05/13/20 1400  BP: 139/63 (!) 185/60 (!) 146/55 (!) 169/58  Pulse: 73 72 69 74  Resp: 15 17 19 16   Temp:  (!) 97.3 F (36.3 C) 97.8 F (36.6 C)   TempSrc:  Oral Oral Oral  SpO2: 99% 100% 98% 96%  Weight:      Height:        Intake/Output Summary (Last 24 hours) at 05/13/2020 1614 Last data filed at 05/13/2020 1400 Gross per 24 hour  Intake 795 ml  Output 1000 ml  Net -205 ml   Filed Weights   05/05/20 1022 05/07/20 0831 05/11/20  0820  Weight: 102 kg 102 kg 102 kg    Examination:  Ill-appearing African-American male obese no distress S1-S2 no murmur Abdomen distended no rebound no guarding Chest clear Bilateral upper extremity swelling No rash   Data Reviewed: personally reviewed   CBC    Component Value Date/Time   WBC 10.5 05/13/2020 0520   RBC 2.09 (L) 05/13/2020 0520   HGB 6.2 (LL) 05/13/2020 0520   HCT 18.9 (L) 05/13/2020 0520   PLT 93 (L) 05/13/2020 0520   MCV 90.4 05/13/2020 0520   MCH 29.7 05/13/2020 0520   MCHC 32.8 05/13/2020 0520   RDW 17.6 (H) 05/13/2020 0520   LYMPHSABS 1.0 05/13/2020 0520   MONOABS 0.8 05/13/2020 0520   EOSABS 0.3 05/13/2020 0520   BASOSABS 0.0 05/13/2020 0520   CMP Latest Ref Rng & Units 05/13/2020 05/12/2020 05/11/2020  Glucose 70 - 99 mg/dL 161(W) 960(A) 540(J)  BUN 8 - 23 mg/dL 81(X) 91(Y) 78(G)  Creatinine 0.61 - 1.24 mg/dL 9.56(O) 1.30(Q) 6.57(Q)  Sodium 135 - 145 mmol/L 137 137 138  Potassium 3.5 - 5.1 mmol/L 3.5 3.7 3.6  Chloride 98 - 111 mmol/L 108 109 109  CO2 22 - 32 mmol/L 23 23 21(L)  Calcium 8.9 - 10.3 mg/dL 7.4(L) 7.1(L) 7.2(L)  Total Protein 6.5 - 8.1 g/dL 4.6(N) 6.2(X) 3.9(L)  Total Bilirubin 0.3 - 1.2 mg/dL 0.7 0.7 0.5  Alkaline Phos 38 - 126  U/L 34(L) 33(L) 38  AST 15 - 41 U/L ALT 0 - 44 U/L Radiology Studies: IR Angiogram Visceral Selective  Result Date: 05/11/2020 INDICATION: 87 year old with persistent lower GI bleeding. Recent CTA demonstrates an area of bleeding in the right colon. Small pulmonary emboli on recent CTA. Patient presents for arterial embolization and IVC filter placement. EXAM: 1. MESENTERIC ANGIOGRAPHY: SMA AND RIGHT COLIC BRANCHES 2. COIL EMBOLIZATION OF RIGHT COLIC ARTERY BRANCH 3. ULTRASOUND GUIDANCE FOR VASCULAR ACCESS 4. PLACEMENT OF IVC FILTER 5. SELECTIVE CANNULATION OF BILATERAL RENAL VEINS MEDICATIONS: Moderate sedation ANESTHESIA/SEDATION: Moderate (conscious) sedation was employed during this  procedure. A total of Versed 1.0 mg and Fentanyl 50 mcg was administered intravenously. Moderate Sedation Time: 66 minutes. The patient's level of consciousness and vital signs were monitored continuously by radiology nursing throughout the procedure under my direct supervision. CONTRAST:  60 mL Omnipaque 300 FLUOROSCOPY TIME:  Fluoroscopy Time: 19 minutes and 18 seconds COMPLICATIONS: None immediate. PROCEDURE: Informed consent was obtained from the patient following explanation of the procedure, risks, benefits and alternatives. The patient understands, agrees and consents for the procedure. All questions were addressed. A time out was performed prior to the initiation of the procedure. Right groin was prepped and draped in sterile fashion. Maximal barrier sterile technique was utilized including caps, mask, sterile gowns, sterile gloves, sterile drape, hand hygiene and skin antiseptic. Ultrasound confirmed a patent right common femoral vein and right common femoral artery. Ultrasound image saved for documentation. Skin was anesthetized with 1% lidocaine. Small incision was made. 21 gauge needle was directed into the right common femoral vein with ultrasound guidance. Micropuncture dilator set was easily advanced into the vein. Tract was dilated to accommodate the IVC filter sheath. IVC venogram was performed using carbon dioxide. Bilateral renal veins were cannulated with a C2 catheter and Bentson wire. IVC filter was deployed below the renal veins. The venous sheath was removed with manual compression. Another small incision was made. 21 gauge needle was directed into the right common femoral artery with ultrasound guidance. Micropuncture dilator set was placed. Five [place] vascular sheath was placed over a Bentson wire. A C2 catheter was used to cannulate the SMA. SMA arteriography was performed. STC microcatheter was advanced into the right colic artery and additional angiography was performed. Area of  bleeding in the ascending colon near the hepatic flexure was identified. Microcatheter was advanced into the feeding artery using a 0.014 Fathom wire. Catheter placement was confirmed within the branch supplying the area of bleeding. Two 2 mm soft IDC Interlock coils were deployed within the feeding artery. Follow-up angiography demonstrated a small proximal branch that could potentially be supplying the bleeder. A 2 mm low profile Ruby coil was deployed at this branch vessel. Final angiography was performed through the microcatheter. Microcatheter and 5 French catheter removed without complication. Angiogram was performed through the right groin sheath. The right groin sheath was removed using an Angio-Seal closure device. Right groin hemostasis at the end of the procedure. FINDINGS: 1. IVC was patent on the carbon dioxide venogram. Bilateral renal veins were cannulated with the Bentson wire. Filter was deployed below the renal veins. 2. SMA angiography demonstrates a patent SMA with subtle area of bleeding in the ascending colon near the hepatic flexure. This finding corresponded with the area of contrast extravasation on the recent CTA. Microcatheter was advanced into distal right colic artery branches and the area of bleeding was confirmed. Transverse branch  supplying the area of bleeding was successfully cannulated and this branch and a small branch coming off it were embolized using 2 mm coils. No evidence for active bleeding following coil embolization. IMPRESSION: 1. Successful coil embolization of a distal right colic artery branch supplying the area of bleeding in the ascending colon near the hepatic flexure. 2. Placement of IVC filter for pulmonary emboli. IVC filter is potentially retrievable. Electronically Signed   By: Richarda Overlie M.D.   On: 05/11/2020 17:42   IR Angiogram Selective Each Additional Vessel  Result Date: 05/11/2020 INDICATION: 87 year old with persistent lower GI bleeding. Recent CTA  demonstrates an area of bleeding in the right colon. Small pulmonary emboli on recent CTA. Patient presents for arterial embolization and IVC filter placement. EXAM: 1. MESENTERIC ANGIOGRAPHY: SMA AND RIGHT COLIC BRANCHES 2. COIL EMBOLIZATION OF RIGHT COLIC ARTERY BRANCH 3. ULTRASOUND GUIDANCE FOR VASCULAR ACCESS 4. PLACEMENT OF IVC FILTER 5. SELECTIVE CANNULATION OF BILATERAL RENAL VEINS MEDICATIONS: Moderate sedation ANESTHESIA/SEDATION: Moderate (conscious) sedation was employed during this procedure. A total of Versed 1.0 mg and Fentanyl 50 mcg was administered intravenously. Moderate Sedation Time: 66 minutes. The patient's level of consciousness and vital signs were monitored continuously by radiology nursing throughout the procedure under my direct supervision. CONTRAST:  60 mL Omnipaque 300 FLUOROSCOPY TIME:  Fluoroscopy Time: 19 minutes and 18 seconds COMPLICATIONS: None immediate. PROCEDURE: Informed consent was obtained from the patient following explanation of the procedure, risks, benefits and alternatives. The patient understands, agrees and consents for the procedure. All questions were addressed. A time out was performed prior to the initiation of the procedure. Right groin was prepped and draped in sterile fashion. Maximal barrier sterile technique was utilized including caps, mask, sterile gowns, sterile gloves, sterile drape, hand hygiene and skin antiseptic. Ultrasound confirmed a patent right common femoral vein and right common femoral artery. Ultrasound image saved for documentation. Skin was anesthetized with 1% lidocaine. Small incision was made. 21 gauge needle was directed into the right common femoral vein with ultrasound guidance. Micropuncture dilator set was easily advanced into the vein. Tract was dilated to accommodate the IVC filter sheath. IVC venogram was performed using carbon dioxide. Bilateral renal veins were cannulated with a C2 catheter and Bentson wire. IVC filter was  deployed below the renal veins. The venous sheath was removed with manual compression. Another small incision was made. 21 gauge needle was directed into the right common femoral artery with ultrasound guidance. Micropuncture dilator set was placed. Five [place] vascular sheath was placed over a Bentson wire. A C2 catheter was used to cannulate the SMA. SMA arteriography was performed. STC microcatheter was advanced into the right colic artery and additional angiography was performed. Area of bleeding in the ascending colon near the hepatic flexure was identified. Microcatheter was advanced into the feeding artery using a 0.014 Fathom wire. Catheter placement was confirmed within the branch supplying the area of bleeding. Two 2 mm soft IDC Interlock coils were deployed within the feeding artery. Follow-up angiography demonstrated a small proximal branch that could potentially be supplying the bleeder. A 2 mm low profile Ruby coil was deployed at this branch vessel. Final angiography was performed through the microcatheter. Microcatheter and 5 French catheter removed without complication. Angiogram was performed through the right groin sheath. The right groin sheath was removed using an Angio-Seal closure device. Right groin hemostasis at the end of the procedure. FINDINGS: 1. IVC was patent on the carbon dioxide venogram. Bilateral renal veins were cannulated with the  Bentson wire. Filter was deployed below the renal veins. 2. SMA angiography demonstrates a patent SMA with subtle area of bleeding in the ascending colon near the hepatic flexure. This finding corresponded with the area of contrast extravasation on the recent CTA. Microcatheter was advanced into distal right colic artery branches and the area of bleeding was confirmed. Transverse branch supplying the area of bleeding was successfully cannulated and this branch and a small branch coming off it were embolized using 2 mm coils. No evidence for active  bleeding following coil embolization. IMPRESSION: 1. Successful coil embolization of a distal right colic artery branch supplying the area of bleeding in the ascending colon near the hepatic flexure. 2. Placement of IVC filter for pulmonary emboli. IVC filter is potentially retrievable. Electronically Signed   By: Richarda Overlie M.D.   On: 05/11/2020 17:42   IR Venogram Renal Bi  Result Date: 05/11/2020 INDICATION: 87 year old with persistent lower GI bleeding. Recent CTA demonstrates an area of bleeding in the right colon. Small pulmonary emboli on recent CTA. Patient presents for arterial embolization and IVC filter placement. EXAM: 1. MESENTERIC ANGIOGRAPHY: SMA AND RIGHT COLIC BRANCHES 2. COIL EMBOLIZATION OF RIGHT COLIC ARTERY BRANCH 3. ULTRASOUND GUIDANCE FOR VASCULAR ACCESS 4. PLACEMENT OF IVC FILTER 5. SELECTIVE CANNULATION OF BILATERAL RENAL VEINS MEDICATIONS: Moderate sedation ANESTHESIA/SEDATION: Moderate (conscious) sedation was employed during this procedure. A total of Versed 1.0 mg and Fentanyl 50 mcg was administered intravenously. Moderate Sedation Time: 66 minutes. The patient's level of consciousness and vital signs were monitored continuously by radiology nursing throughout the procedure under my direct supervision. CONTRAST:  60 mL Omnipaque 300 FLUOROSCOPY TIME:  Fluoroscopy Time: 19 minutes and 18 seconds COMPLICATIONS: None immediate. PROCEDURE: Informed consent was obtained from the patient following explanation of the procedure, risks, benefits and alternatives. The patient understands, agrees and consents for the procedure. All questions were addressed. A time out was performed prior to the initiation of the procedure. Right groin was prepped and draped in sterile fashion. Maximal barrier sterile technique was utilized including caps, mask, sterile gowns, sterile gloves, sterile drape, hand hygiene and skin antiseptic. Ultrasound confirmed a patent right common femoral vein and right common  femoral artery. Ultrasound image saved for documentation. Skin was anesthetized with 1% lidocaine. Small incision was made. 21 gauge needle was directed into the right common femoral vein with ultrasound guidance. Micropuncture dilator set was easily advanced into the vein. Tract was dilated to accommodate the IVC filter sheath. IVC venogram was performed using carbon dioxide. Bilateral renal veins were cannulated with a C2 catheter and Bentson wire. IVC filter was deployed below the renal veins. The venous sheath was removed with manual compression. Another small incision was made. 21 gauge needle was directed into the right common femoral artery with ultrasound guidance. Micropuncture dilator set was placed. Five [place] vascular sheath was placed over a Bentson wire. A C2 catheter was used to cannulate the SMA. SMA arteriography was performed. STC microcatheter was advanced into the right colic artery and additional angiography was performed. Area of bleeding in the ascending colon near the hepatic flexure was identified. Microcatheter was advanced into the feeding artery using a 0.014 Fathom wire. Catheter placement was confirmed within the branch supplying the area of bleeding. Two 2 mm soft IDC Interlock coils were deployed within the feeding artery. Follow-up angiography demonstrated a small proximal branch that could potentially be supplying the bleeder. A 2 mm low profile Ruby coil was deployed at this branch vessel. Final angiography  was performed through the microcatheter. Microcatheter and 5 French catheter removed without complication. Angiogram was performed through the right groin sheath. The right groin sheath was removed using an Angio-Seal closure device. Right groin hemostasis at the end of the procedure. FINDINGS: 1. IVC was patent on the carbon dioxide venogram. Bilateral renal veins were cannulated with the Bentson wire. Filter was deployed below the renal veins. 2. SMA angiography demonstrates  a patent SMA with subtle area of bleeding in the ascending colon near the hepatic flexure. This finding corresponded with the area of contrast extravasation on the recent CTA. Microcatheter was advanced into distal right colic artery branches and the area of bleeding was confirmed. Transverse branch supplying the area of bleeding was successfully cannulated and this branch and a small branch coming off it were embolized using 2 mm coils. No evidence for active bleeding following coil embolization. IMPRESSION: 1. Successful coil embolization of a distal right colic artery branch supplying the area of bleeding in the ascending colon near the hepatic flexure. 2. Placement of IVC filter for pulmonary emboli. IVC filter is potentially retrievable. Electronically Signed   By: Richarda Overlie M.D.   On: 05/11/2020 17:42   IR IVC FILTER PLMT / S&I Lenise Arena GUID/MOD SED  Result Date: 05/11/2020 INDICATION: 87 year old with persistent lower GI bleeding. Recent CTA demonstrates an area of bleeding in the right colon. Small pulmonary emboli on recent CTA. Patient presents for arterial embolization and IVC filter placement. EXAM: 1. MESENTERIC ANGIOGRAPHY: SMA AND RIGHT COLIC BRANCHES 2. COIL EMBOLIZATION OF RIGHT COLIC ARTERY BRANCH 3. ULTRASOUND GUIDANCE FOR VASCULAR ACCESS 4. PLACEMENT OF IVC FILTER 5. SELECTIVE CANNULATION OF BILATERAL RENAL VEINS MEDICATIONS: Moderate sedation ANESTHESIA/SEDATION: Moderate (conscious) sedation was employed during this procedure. A total of Versed 1.0 mg and Fentanyl 50 mcg was administered intravenously. Moderate Sedation Time: 66 minutes. The patient's level of consciousness and vital signs were monitored continuously by radiology nursing throughout the procedure under my direct supervision. CONTRAST:  60 mL Omnipaque 300 FLUOROSCOPY TIME:  Fluoroscopy Time: 19 minutes and 18 seconds COMPLICATIONS: None immediate. PROCEDURE: Informed consent was obtained from the patient following explanation of  the procedure, risks, benefits and alternatives. The patient understands, agrees and consents for the procedure. All questions were addressed. A time out was performed prior to the initiation of the procedure. Right groin was prepped and draped in sterile fashion. Maximal barrier sterile technique was utilized including caps, mask, sterile gowns, sterile gloves, sterile drape, hand hygiene and skin antiseptic. Ultrasound confirmed a patent right common femoral vein and right common femoral artery. Ultrasound image saved for documentation. Skin was anesthetized with 1% lidocaine. Small incision was made. 21 gauge needle was directed into the right common femoral vein with ultrasound guidance. Micropuncture dilator set was easily advanced into the vein. Tract was dilated to accommodate the IVC filter sheath. IVC venogram was performed using carbon dioxide. Bilateral renal veins were cannulated with a C2 catheter and Bentson wire. IVC filter was deployed below the renal veins. The venous sheath was removed with manual compression. Another small incision was made. 21 gauge needle was directed into the right common femoral artery with ultrasound guidance. Micropuncture dilator set was placed. Five [place] vascular sheath was placed over a Bentson wire. A C2 catheter was used to cannulate the SMA. SMA arteriography was performed. STC microcatheter was advanced into the right colic artery and additional angiography was performed. Area of bleeding in the ascending colon near the hepatic flexure was identified. Microcatheter was advanced  into the feeding artery using a 0.014 Fathom wire. Catheter placement was confirmed within the branch supplying the area of bleeding. Two 2 mm soft IDC Interlock coils were deployed within the feeding artery. Follow-up angiography demonstrated a small proximal branch that could potentially be supplying the bleeder. A 2 mm low profile Ruby coil was deployed at this branch vessel. Final  angiography was performed through the microcatheter. Microcatheter and 5 French catheter removed without complication. Angiogram was performed through the right groin sheath. The right groin sheath was removed using an Angio-Seal closure device. Right groin hemostasis at the end of the procedure. FINDINGS: 1. IVC was patent on the carbon dioxide venogram. Bilateral renal veins were cannulated with the Bentson wire. Filter was deployed below the renal veins. 2. SMA angiography demonstrates a patent SMA with subtle area of bleeding in the ascending colon near the hepatic flexure. This finding corresponded with the area of contrast extravasation on the recent CTA. Microcatheter was advanced into distal right colic artery branches and the area of bleeding was confirmed. Transverse branch supplying the area of bleeding was successfully cannulated and this branch and a small branch coming off it were embolized using 2 mm coils. No evidence for active bleeding following coil embolization. IMPRESSION: 1. Successful coil embolization of a distal right colic artery branch supplying the area of bleeding in the ascending colon near the hepatic flexure. 2. Placement of IVC filter for pulmonary emboli. IVC filter is potentially retrievable. Electronically Signed   By: Richarda Overlie M.D.   On: 05/11/2020 17:42   IR US Guide Vasc Access Right  Result Date: 05/11/2020 INDICATION: 87 year old with persistent lower GI bleeding. Recent CTA demonstrates an area of bleeding in the right colon. Small pulmonary emboli on recent CTA. Patient presents for arterial embolization and IVC filter placement. EXAM: 1. MESENTERIC ANGIOGRAPHY: SMA AND RIGHT COLIC BRANCHES 2. COIL EMBOLIZATION OF RIGHT COLIC ARTERY BRANCH 3. ULTRASOUND GUIDANCE FOR VASCULAR ACCESS 4. PLACEMENT OF IVC FILTER 5. SELECTIVE CANNULATION OF BILATERAL RENAL VEINS MEDICATIONS: Moderate sedation ANESTHESIA/SEDATION: Moderate (conscious) sedation was employed during this  procedure. A total of Versed 1.0 mg and Fentanyl 50 mcg was administered intravenously. Moderate Sedation Time: 66 minutes. The patient's level of consciousness and vital signs were monitored continuously by radiology nursing throughout the procedure under my direct supervision. CONTRAST:  60 mL Omnipaque 300 FLUOROSCOPY TIME:  Fluoroscopy Time: 19 minutes and 18 seconds COMPLICATIONS: None immediate. PROCEDURE: Informed consent was obtained from the patient following explanation of the procedure, risks, benefits and alternatives. The patient understands, agrees and consents for the procedure. All questions were addressed. A time out was performed prior to the initiation of the procedure. Right groin was prepped and draped in sterile fashion. Maximal barrier sterile technique was utilized including caps, mask, sterile gowns, sterile gloves, sterile drape, hand hygiene and skin antiseptic. Ultrasound confirmed a patent right common femoral vein and right common femoral artery. Ultrasound image saved for documentation. Skin was anesthetized with 1% lidocaine. Small incision was made. 21 gauge needle was directed into the right common femoral vein with ultrasound guidance. Micropuncture dilator set was easily advanced into the vein. Tract was dilated to accommodate the IVC filter sheath. IVC venogram was performed using carbon dioxide. Bilateral renal veins were cannulated with a C2 catheter and Bentson wire. IVC filter was deployed below the renal veins. The venous sheath was removed with manual compression. Another small incision was made. 21 gauge needle was directed into the right common femoral artery with  ultrasound guidance. Micropuncture dilator set was placed. Five [place] vascular sheath was placed over a Bentson wire. A C2 catheter was used to cannulate the SMA. SMA arteriography was performed. STC microcatheter was advanced into the right colic artery and additional angiography was performed. Area of  bleeding in the ascending colon near the hepatic flexure was identified. Microcatheter was advanced into the feeding artery using a 0.014 Fathom wire. Catheter placement was confirmed within the branch supplying the area of bleeding. Two 2 mm soft IDC Interlock coils were deployed within the feeding artery. Follow-up angiography demonstrated a small proximal branch that could potentially be supplying the bleeder. A 2 mm low profile Ruby coil was deployed at this branch vessel. Final angiography was performed through the microcatheter. Microcatheter and 5 French catheter removed without complication. Angiogram was performed through the right groin sheath. The right groin sheath was removed using an Angio-Seal closure device. Right groin hemostasis at the end of the procedure. FINDINGS: 1. IVC was patent on the carbon dioxide venogram. Bilateral renal veins were cannulated with the Bentson wire. Filter was deployed below the renal veins. 2. SMA angiography demonstrates a patent SMA with subtle area of bleeding in the ascending colon near the hepatic flexure. This finding corresponded with the area of contrast extravasation on the recent CTA. Microcatheter was advanced into distal right colic artery branches and the area of bleeding was confirmed. Transverse branch supplying the area of bleeding was successfully cannulated and this branch and a small branch coming off it were embolized using 2 mm coils. No evidence for active bleeding following coil embolization. IMPRESSION: 1. Successful coil embolization of a distal right colic artery branch supplying the area of bleeding in the ascending colon near the hepatic flexure. 2. Placement of IVC filter for pulmonary emboli. IVC filter is potentially retrievable. Electronically Signed   By: Richarda Overlie M.D.   On: 05/11/2020 17:42   ECHOCARDIOGRAM COMPLETE  Result Date: 05/13/2020    ECHOCARDIOGRAM REPORT   Patient Name:   JARRELL ARMOND Date of Exam: 05/11/2020 Medical  Rec #:  [id number]         Height:       73.0 in Accession #:    [id number]        Weight:       224.9 lb Date of Birth:  [birth year]         BSA:          2.262 m Patient Age:    86 years          BP:           145/41 mmHg Patient Gender: M                 HR:           85 bpm. Exam Location:  Inpatient Procedure: 2D Echo, Cardiac Doppler and Color Doppler Indications:    Atrial fibrillation  History:        Patient has no prior history of Echocardiogram examinations.                 Risk Factors:Hypertension, Diabetes, Dyslipidemia and Former                 Smoker. Hx COVID-19. CKD.  Sonographer:    Ross Ludwig RDCS (AE) Referring Phys: [id number] [facility] [employer] IMPRESSIONS  1. Left ventricular ejection fraction, by estimation, is 70 to 75%. The left ventricle has hyperdynamic function. The left ventricle has no regional wall  motion abnormalities. There is severe concentric left ventricular hypertrophy. Left ventricular diastolic parameters are consistent with Grade I diastolic dysfunction (impaired relaxation).  2. Right ventricular systolic function is normal. The right ventricular size is normal. There is moderately elevated pulmonary artery systolic pressure. The estimated right ventricular systolic pressure is 47.4 mmHg.  3. The pericardial effusion is circumferential. There is no evidence of cardiac tamponade.  4. The mitral valve is normal in structure. No evidence of mitral valve regurgitation. No evidence of mitral stenosis.  5. Tricuspid valve regurgitation is mild to moderate.  6. The aortic valve is tricuspid. There is mild calcification of the aortic valve. There is mild thickening of the aortic valve. Aortic valve regurgitation is not visualized. Mild aortic valve sclerosis is present, with no evidence of aortic valve stenosis.  7. The inferior vena cava is normal in size with greater than 50% respiratory variability, suggesting right atrial pressure of 3 mmHg. FINDINGS  Left Ventricle: Left ventricular  ejection fraction, by estimation, is 70 to 75%. The left ventricle has hyperdynamic function. The left ventricle has no regional wall motion abnormalities. The left ventricular internal cavity size was normal in size. There is severe concentric left ventricular hypertrophy. Left ventricular diastolic parameters are consistent with Grade I diastolic dysfunction (impaired relaxation). Right Ventricle: The right ventricular size is normal. No increase in right ventricular wall thickness. Right ventricular systolic function is normal. There is moderately elevated pulmonary artery systolic pressure. The tricuspid regurgitant velocity is 3.14 m/s, and with an assumed right atrial pressure of 8 mmHg, the estimated right ventricular systolic pressure is 47.4 mmHg. Left Atrium: Left atrial size was normal in size. Right Atrium: Right atrial size was normal in size. Pericardium: Trivial pericardial effusion is present. The pericardial effusion is circumferential. There is no evidence of cardiac tamponade. Mitral Valve: The mitral valve is normal in structure. No evidence of mitral valve regurgitation. No evidence of mitral valve stenosis. Tricuspid Valve: The tricuspid valve is normal in structure. Tricuspid valve regurgitation is mild to moderate. No evidence of tricuspid stenosis. Aortic Valve: The aortic valve is tricuspid. There is mild calcification of the aortic valve. There is mild thickening of the aortic valve. Aortic valve regurgitation is not visualized. Mild aortic valve sclerosis is present, with no evidence of aortic valve stenosis. Aortic valve mean gradient measures 3.0 mmHg. Aortic valve peak gradient measures 5.8 mmHg. Aortic valve area, by VTI measures 3.43 cm. Pulmonic Valve: The pulmonic valve was normal in structure. Pulmonic valve regurgitation is not visualized. No evidence of pulmonic stenosis. Aorta: The aortic root and ascending aorta are structurally normal, with no evidence of dilitation. Venous:  The inferior vena cava is normal in size with greater than 50% respiratory variability, suggesting right atrial pressure of 3 mmHg. IAS/Shunts: No atrial level shunt detected by color flow Doppler.  LEFT VENTRICLE PLAX 2D LVIDd:         3.70 cm LVIDs:         2.20 cm LV PW:         2.30 cm LV IVS:        2.30 cm LVOT diam:     2.10 cm LV SV:         81 LV SV Index:   36 LVOT Area:     3.46 cm  RIGHT VENTRICLE             IVC RV Basal diam:  2.70 cm     IVC diam: 1.30 cm RV  S prime:     16.80 cm/s TAPSE (M-mode): 2.3 cm LEFT ATRIUM             Index       RIGHT ATRIUM           Index LA diam:        2.60 cm 1.15 cm/m  RA Area:     16.90 cm LA Vol (A2C):   76.2 ml 33.69 ml/m RA Volume:   38.70 ml  17.11 ml/m LA Vol (A4C):   60.5 ml 26.75 ml/m LA Biplane Vol: 72.3 ml 31.97 ml/m  AORTIC VALVE AV Area (Vmax):    3.32 cm AV Area (Vmean):   3.08 cm AV Area (VTI):     3.43 cm AV Vmax:           120.00 cm/s AV Vmean:          85.200 cm/s AV VTI:            0.236 m AV Peak Grad:      5.8 mmHg AV Mean Grad:      3.0 mmHg LVOT Vmax:         115.00 cm/s LVOT Vmean:        75.800 cm/s LVOT VTI:          0.234 m LVOT/AV VTI ratio: 0.99  AORTA Ao Root diam: 3.60 cm Ao Asc diam:  3.10 cm TRICUSPID VALVE TR Peak grad:   39.4 mmHg TR Vmax:        314.00 cm/s  SHUNTS Systemic VTI:  0.23 m Systemic Diam: 2.10 cm Donato Schultz MD Electronically signed by Donato Schultz MD Signature Date/Time: 05/13/2020/10:17:41 AM    Final    IR EMBO ART  VEN HEMORR LYMPH EXTRAV  INC GUIDE ROADMAPPING  Result Date: 05/11/2020 INDICATION: 87 year old with persistent lower GI bleeding. Recent CTA demonstrates an area of bleeding in the right colon. Small pulmonary emboli on recent CTA. Patient presents for arterial embolization and IVC filter placement. EXAM: 1. MESENTERIC ANGIOGRAPHY: SMA AND RIGHT COLIC BRANCHES 2. COIL EMBOLIZATION OF RIGHT COLIC ARTERY BRANCH 3. ULTRASOUND GUIDANCE FOR VASCULAR ACCESS 4. PLACEMENT OF IVC FILTER 5. SELECTIVE  CANNULATION OF BILATERAL RENAL VEINS MEDICATIONS: Moderate sedation ANESTHESIA/SEDATION: Moderate (conscious) sedation was employed during this procedure. A total of Versed 1.0 mg and Fentanyl 50 mcg was administered intravenously. Moderate Sedation Time: 66 minutes. The patient's level of consciousness and vital signs were monitored continuously by radiology nursing throughout the procedure under my direct supervision. CONTRAST:  60 mL Omnipaque 300 FLUOROSCOPY TIME:  Fluoroscopy Time: 19 minutes and 18 seconds COMPLICATIONS: None immediate. PROCEDURE: Informed consent was obtained from the patient following explanation of the procedure, risks, benefits and alternatives. The patient understands, agrees and consents for the procedure. All questions were addressed. A time out was performed prior to the initiation of the procedure. Right groin was prepped and draped in sterile fashion. Maximal barrier sterile technique was utilized including caps, mask, sterile gowns, sterile gloves, sterile drape, hand hygiene and skin antiseptic. Ultrasound confirmed a patent right common femoral vein and right common femoral artery. Ultrasound image saved for documentation. Skin was anesthetized with 1% lidocaine. Small incision was made. 21 gauge needle was directed into the right common femoral vein with ultrasound guidance. Micropuncture dilator set was easily advanced into the vein. Tract was dilated to accommodate the IVC filter sheath. IVC venogram was performed using carbon dioxide. Bilateral renal veins were cannulated with a C2 catheter and Bentson wire.  IVC filter was deployed below the renal veins. The venous sheath was removed with manual compression. Another small incision was made. 21 gauge needle was directed into the right common femoral artery with ultrasound guidance. Micropuncture dilator set was placed. Five [place] vascular sheath was placed over a Bentson wire. A C2 catheter was used to cannulate the SMA. SMA  arteriography was performed. STC microcatheter was advanced into the right colic artery and additional angiography was performed. Area of bleeding in the ascending colon near the hepatic flexure was identified. Microcatheter was advanced into the feeding artery using a 0.014 Fathom wire. Catheter placement was confirmed within the branch supplying the area of bleeding. Two 2 mm soft IDC Interlock coils were deployed within the feeding artery. Follow-up angiography demonstrated a small proximal branch that could potentially be supplying the bleeder. A 2 mm low profile Ruby coil was deployed at this branch vessel. Final angiography was performed through the microcatheter. Microcatheter and 5 French catheter removed without complication. Angiogram was performed through the right groin sheath. The right groin sheath was removed using an Angio-Seal closure device. Right groin hemostasis at the end of the procedure. FINDINGS: 1. IVC was patent on the carbon dioxide venogram. Bilateral renal veins were cannulated with the Bentson wire. Filter was deployed below the renal veins. 2. SMA angiography demonstrates a patent SMA with subtle area of bleeding in the ascending colon near the hepatic flexure. This finding corresponded with the area of contrast extravasation on the recent CTA. Microcatheter was advanced into distal right colic artery branches and the area of bleeding was confirmed. Transverse branch supplying the area of bleeding was successfully cannulated and this branch and a small branch coming off it were embolized using 2 mm coils. No evidence for active bleeding following coil embolization. IMPRESSION: 1. Successful coil embolization of a distal right colic artery branch supplying the area of bleeding in the ascending colon near the hepatic flexure. 2. Placement of IVC filter for pulmonary emboli. IVC filter is potentially retrievable. Electronically Signed   By: Richarda Overlie M.D.   On: 05/11/2020 17:42   VAS  [place] LOWER EXTREMITY VENOUS (DVT)  Result Date: 05/12/2020  Lower Venous DVT Study Patient Name:  KALDEN WANKE  Date of Exam:   05/11/2020 Medical Rec #: [id number]          Accession #:    [id number] Date of Birth: [birth year]          Patient Gender: M Patient Age:   75Y Exam Location:  [facility] Procedure:      VAS [place] LOWER EXTREMITY VENOUS (DVT) Referring Phys: [id number] [facility] Jatniel Verastegui --------------------------------------------------------------------------------  Indications: Pulmonary embolism.  Risk Factors: Patient had IVC filter placed today. GI bleed. Limitations: Patient unable to move legs secondary to needing to remain flat post procedure. and bandages. Comparison Study: No prior study on file Performing Technologist: Sherren Kerns RVS  Examination Guidelines: A complete evaluation includes B-mode imaging, spectral Doppler, color Doppler, and power Doppler as needed of all accessible portions of each vessel. Bilateral testing is considered an integral part of a complete examination. Limited examinations for reoccurring indications may be performed as noted. The reflux portion of the exam is performed with the patient in reverse Trendelenburg.  +---------+---------------+---------+-----------+----------+-------------------+ RIGHT    CompressibilityPhasicitySpontaneityPropertiesThrombus Aging      +---------+---------------+---------+-----------+----------+-------------------+ CFV                     Yes      Yes  patent by color and                                                       Doppler             +---------+---------------+---------+-----------+----------+-------------------+ SFJ                                                   bandage in way      +---------+---------------+---------+-----------+----------+-------------------+ FV Prox  Full                                                              +---------+---------------+---------+-----------+----------+-------------------+ FV Mid   Full                                                             +---------+---------------+---------+-----------+----------+-------------------+ FV DistalFull                                                             +---------+---------------+---------+-----------+----------+-------------------+ PFV      Full                                                             +---------+---------------+---------+-----------+----------+-------------------+ POP                     Yes      Yes                  patent by color and                                                       Doppler             +---------+---------------+---------+-----------+----------+-------------------+ PTV      Full                                                             +---------+---------------+---------+-----------+----------+-------------------+ PERO     None  Age Indeterminate   +---------+---------------+---------+-----------+----------+-------------------+   +---------+---------------+---------+-----------+----------+-------------------+ LEFT     CompressibilityPhasicitySpontaneityPropertiesThrombus Aging      +---------+---------------+---------+-----------+----------+-------------------+ CFV      Full           Yes      Yes                                      +---------+---------------+---------+-----------+----------+-------------------+ SFJ      Full                                                             +---------+---------------+---------+-----------+----------+-------------------+ FV Prox  Full                                                             +---------+---------------+---------+-----------+----------+-------------------+ FV Mid   Full                                                              +---------+---------------+---------+-----------+----------+-------------------+ FV DistalFull                                                             +---------+---------------+---------+-----------+----------+-------------------+ PFV      Full                                                             +---------+---------------+---------+-----------+----------+-------------------+ POP                     Yes      Yes                  patent by color and                                                       Doppler             +---------+---------------+---------+-----------+----------+-------------------+ PTV      None                                         Age Indeterminate   +---------+---------------+---------+-----------+----------+-------------------+ PERO     None  Age Indeterminate   +---------+---------------+---------+-----------+----------+-------------------+     Summary: RIGHT: - Findings consistent with age indeterminate deep vein thrombosis involving the right peroneal veins.  LEFT: - Findings consistent with age indeterminate deep vein thrombosis involving the left posterior tibial veins, and left peroneal veins.  *See table(s) above for measurements and observations. Electronically signed by Fabienne Bruns MD on 05/12/2020 at 2:32:16 PM.    Final    CT Angio Abd/Pel w/ and/or w/o  Result Date: 05/13/2020 CLINICAL DATA:  Status post successful transcatheter coil embolization of a right colonic diverticular GI bleed on 05/11/2020. Drop in hemoglobin overnight. EXAM: CT ANGIOGRAPHY ABDOMEN AND PELVIS WITH CONTRAST AND WITHOUT CONTRAST TECHNIQUE: Multidetector CT imaging of the abdomen and pelvis was performed using the standard protocol during bolus administration of intravenous contrast. Multiplanar reconstructed images and MIPs were obtained and reviewed to evaluate the vascular anatomy. CONTRAST:  75mL OMNIPAQUE  IOHEXOL 350 MG/ML SOLN COMPARISON:  Prior CTA of the abdomen and pelvis and arteriographic procedure on 05/11/2020 FINDINGS: VASCULAR Aorta: Normal abdominal aorta without evidence atherosclerosis, aneurysm or dissection. Celiac: Normally patent. Normally patent branch vessels and branch vessel anatomy. SMA: Normally patent. Renals: Bilateral paired renal artery supply is stable and demonstrates normal patency. IMA: Normally patent. Inflow: Normally patent bilateral iliac arteries without aneurysm or significant obstructive disease. Proximal Outflow: Normally patent bilateral common femoral arteries and femoral bifurcations. No evidence of pseudoaneurysm after recent arteriographic procedure from right femoral access. Veins: Venous phase imaging demonstrates no venous abnormalities. Stable appearance of indwelling infrarenal IVC filter. Review of the MIP images confirms the above findings. NON-VASCULAR Lower chest: Small hiatal hernia. Hepatobiliary: Stable scattered small hepatic cysts. The gallbladder is unremarkable. No biliary ductal dilatation. Pancreas: Unremarkable. No pancreatic ductal dilatation or surrounding inflammatory changes. Spleen: Normal in size without focal abnormality. Adrenals/Urinary Tract: Stable renal cysts bilaterally including large upper pole right renal cyst. No hydronephrosis. The bladder contains high density urine related to excretion of contrast. Stomach/Bowel: On the current study, there is some higher density material present in the colon prior to contrast administration which limits evaluation for GI bleed. This material does not change in appearance on the arterial and venous phases of imaging after contrast administration and obvious active gastrointestinal bleeding is not identified by CTA. Coils are present in distal right colic branches. No evidence of bowel obstruction, inflammation or free intraperitoneal air. Normal appendix. Lymphatic: No enlarged abdominal or pelvic  lymph nodes. Reproductive: Prostate is unremarkable. Other: No retroperitoneal, intraperitoneal or groin hemorrhage. No hernias, ascites or focal fluid collections. Musculoskeletal: No acute or significant osseous findings. IMPRESSION: 1. No hemorrhagic complication evident after recent arteriography and embolization of a right colonic diverticular arterial bleed. 2. Although there is some higher density material in the colon on the current study which limits evaluation for intraluminal bleeding, there is no visible change in appearance of intraluminal material on arterial and venous phases of imaging to suggest obvious active gastrointestinal bleeding currently. Electronically Signed   By: Irish Lack M.D.   On: 05/13/2020 13:18     Scheduled Meds: . Chlorhexidine Gluconate Cloth  6 each Topical Daily  . insulin aspart  0-9 Units Subcutaneous TID WC  . latanoprost  1 drop Both Eyes QHS  . pantoprazole  40 mg Intravenous Q12H  . sodium chloride flush  10-40 mL Intracatheter Q12H  . sodium chloride flush  10-40 mL Intracatheter Q12H   Continuous Infusions: . sodium chloride    . lactated ringers 20 mL/hr at 05/11/20 0845  LOS: 8 days   Time spent: 20  Rhetta Mura, MD [facility] To contact the attending provider between 7A-7P or the covering provider during after hours 7P-7A, please log into the web site [email or web address] and access using universal [place] password for that web site. If you do not have the password, please call the hospital operator.  05/13/2020, 4:14 PM

## 2020-05-13 NOTE — Progress Notes (Signed)
PT Cancellation Note  Patient Details Name: Thomas Joseph MRN: [id number] DOB: October 04, 1933   Cancelled Treatment:    Reason Eval/Treat Not Completed: Patient at procedure or test/unavailable   [name] 05/13/2020, 12:36 PM [name], PT Acute Rehabilitation Services Pager: [phone] Office: [phone]

## 2020-05-13 NOTE — Consult Note (Signed)
Thomas Joseph 1933-12-26  [id number].    Requesting MD: Dr. Levora Angel Chief Complaint/Reason for Consult: Recurrent GIB s/p embolization  HPI: Thomas Joseph is a 87 y.o. male with a medical history significant for hypertension, hyperlipidemia, IDDM who presented to [facility] on 4/25 w/ new onset melena and was admitted for GI bleed with acute blood loss anemia.  GI was consulted.  Patient underwent EGD on 4/26 that showed medium-sized hiatal hernia but was negative for bleeding. Patient underwent a bleeding scan on 4/27 that showed active bleeding probably from the ascending colon. Colonoscopy on 4/27 showed poor prep with coffee-ground/old blood in colon and no evidence of active bleeding. CT angio on 4/28 negative for bleeding. Repeat colonoscopy on 5/1 without revealing source of active bleeding.  Follow-up CTA on 5/1 was positive for active bleeding in the right colon. Patient underwent IR mesenteric arteriogram with coil embolization of right colic artery branch by Dr. Lowella Dandy on 5/1.  Patient was also diagnosed with pulmonary embolism and had IVC filter placed at that time.  Despite embolization patient's hemoglobin dropped and was 6.2 this morning. He has required several unit of PRBC's during admission. No melena or hematochezia reported since IR procedure.  Repeat CTA today, 5/3, showed no complication of recent arteriographic and normalization of the right colonic diverticular arterial bleed.  There is no visible active GI bleed noted on CT, although there reported some higher density material in the colon limited evaluation.  He does complain of bruising to his arm. He currently denies any abdominal pain, nausea, vomiting. General surgery was asked to see to consider R hemicolectomy if patients hgb continues to drop. He has a hx of transurethral resection of prostate and hernia repair in the past.   ROS: Review of Systems  Constitutional: Negative for chills and fever.  Respiratory:  Negative for cough and shortness of breath.   Cardiovascular: Negative for chest pain.  Gastrointestinal: Positive for melena. Negative for abdominal pain, constipation, diarrhea, nausea and vomiting.  Endo/Heme/Allergies: Bruises/bleeds easily.  All other systems reviewed and are negative.   Family History  Problem Relation Age of Onset  . CVA Father   . Hypertension Father   . Leukemia Sister   . Thyroid disease Sister   . Cancer Brother     Past Medical History:  Diagnosis Date  . Anemia   . BPH (benign prostatic hyperplasia)   . Chronic kidney disease (CKD) stage G3a/A1, moderately decreased glomerular filtration rate (GFR) between 45-59 mL/min/1.73 square meter and albuminuria creatinine ratio less than 30 mg/g (HCC) 03/07/2020  . Diabetes mellitus without complication (HCC)   . ED (erectile dysfunction)   . Family history of adverse reaction to anesthesia    son had some problem years ago, pt unsure of what exact problem was  . Hypertension   . Obesity   . Pneumonia     Past Surgical History:  Procedure Laterality Date  .  LEFT KNEE ARTHROCOPY    . COLONOSCOPY WITH PROPOFOL Left 05/07/2020   Procedure: COLONOSCOPY WITH PROPOFOL;  Surgeon: Kathi Der, MD;  Location: [facility] ENDOSCOPY;  Service: Gastroenterology;  Laterality: Left;  . COLONOSCOPY WITH PROPOFOL N/A 05/11/2020   Procedure: COLONOSCOPY WITH PROPOFOL;  Surgeon: Charlott Rakes, MD;  Location: [facility] ENDOSCOPY;  Service: Endoscopy;  Laterality: N/A;  . ESOPHAGOGASTRODUODENOSCOPY N/A 05/06/2020   Procedure: ESOPHAGOGASTRODUODENOSCOPY (EGD);  Surgeon: Willis Modena, MD;  Location: [facility] ENDOSCOPY;  Service: Endoscopy;  Laterality: N/A;  . HERNIA REPAIR    .  IR ANGIOGRAM SELECTIVE EACH ADDITIONAL VESSEL  05/11/2020  . IR ANGIOGRAM VISCERAL SELECTIVE  05/11/2020  . IR EMBO ART  VEN HEMORR LYMPH EXTRAV  INC GUIDE ROADMAPPING  05/11/2020  . IR IVC FILTER PLMT / S&I /IMG GUID/MOD SED  05/11/2020  . IR US GUIDE VASC ACCESS RIGHT   05/11/2020  . IR VENOGRAM RENAL BI  05/11/2020  . TRANSURETHRAL RESECTION OF PROSTATE N/A 12/26/2014   Procedure: TRANSURETHRAL RESECTION OF THE PROSTATE;  Surgeon: Malen Gauze, MD;  Location: [facility] ORS;  Service: Urology;  Laterality: N/A;    Social History:  reports that he quit smoking about 35 years ago. He has never used smokeless tobacco. He reports previous alcohol use. He reports that he does not use drugs.  Allergies:  Allergies  Allergen Reactions  . Hydrochlorothiazide Other (See Comments)    Reaction not recalled, believes it just made him urinate very frequently  . Lipitor [Atorvastatin] Other (See Comments)    Muscle pain  . Tizanidine Hcl Other (See Comments)  . Viagra [Sildenafil Citrate] Palpitations    Medications Prior to Admission  Medication Sig Dispense Refill  . acetaminophen (TYLENOL) 325 MG tablet Take 325-650 mg by mouth every 12 (twelve) hours as needed for mild pain or headache.    Marland Kitchen amLODipine (NORVASC) 10 MG tablet Take 10 mg by mouth daily.    . Ascorbic Acid (VITAMIN C) 500 MG CAPS Take 1 tablet by mouth daily.    Marland Kitchen aspirin 81 MG chewable tablet Chew 81 mg by mouth every other day.     . Blood Pressure Monitor DEVI     . cetirizine (ZYRTEC) 10 MG tablet Take 10 mg by mouth daily as needed for allergies.    . hydrocortisone (ANUSOL-HC) 25 MG suppository Place 1 suppository (25 mg total) rectally 2 (two) times daily. 12 suppository 0  . latanoprost (XALATAN) 0.005 % ophthalmic solution Place 1 drop into both eyes at bedtime.    Marland Kitchen lisinopril-hydrochlorothiazide (ZESTORETIC) 20-12.5 MG tablet Take 1 tablet by mouth daily.    . meloxicam (MOBIC) 15 MG tablet Take 15 mg by mouth daily as needed.    . metFORMIN (GLUCOPHAGE-XR) 500 MG 24 hr tablet Take 1,000 mg by mouth daily.     . predniSONE (DELTASONE) 10 MG tablet Take 10 mg by mouth daily.    . rosuvastatin (CRESTOR) 5 MG tablet Take 5 mg by mouth daily.    . tadalafil (CIALIS) 20 MG tablet Take 20 mg  by mouth daily as needed for erectile dysfunction.    Marland Kitchen zinc sulfate 220 (50 Zn) MG capsule Take 220 mg by mouth daily.       Physical Exam: Blood pressure (!) 146/55, pulse 69, temperature 97.8 F (36.6 C), temperature source Oral, resp. rate 19, height 6\' 1"  (1.854 m), weight 102 kg, SpO2 98 %. General: pleasant, WD/WN AA male who is laying in bed in NAD HEENT: head is normocephalic, atraumatic.  Sclera are noninjected.  PERRL.  Ears and nose without any masses or lesions.  Mouth is pink and moist. Dentition fair Heart: regular, rate, and rhythm.  Normal s1,s2. No obvious murmurs, gallops, or rubs noted.  Palpable pedal pulses bilaterally  Lungs: CTAB, no wheezes, rhonchi, or rales noted.  Respiratory effort nonlabored Abd: Soft, ND, NT, +BS, no masses, hernias, or organomegaly. Prior abdominal scars are well healed MS: no BUE/BLE edema, calves soft and nontender Skin: warm and dry with no masses, lesions, or rashes Psych: A&Ox4 with an appropriate  affect Neuro: cranial nerves grossly intact, equal strength in BUE/BLE bilaterally, normal speech, thought process intact, moves all extremities, gait not assessed.   Results for orders placed or performed during the hospital encounter of 05/05/20 (from the past 48 hour(s))  Glucose, capillary     Status: Abnormal   Collection Time: 05/11/20  5:06 PM  Result Value Ref Range   Glucose-Capillary 161 (H) 70 - 99 mg/dL    Comment: Glucose reference range applies only to samples taken after fasting for at least 8 hours.  CBC     Status: Abnormal   Collection Time: 05/11/20  6:19 PM  Result Value Ref Range   WBC 12.6 (H) 4.0 - 10.5 K/uL   RBC 2.08 (L) 4.22 - 5.81 MIL/uL   Hemoglobin 6.3 (LL) 13.0 - 17.0 g/dL    Comment: REPEATED TO VERIFY THIS CRITICAL RESULT HAS VERIFIED AND BEEN CALLED TO AMBER LOWE RN BY KYUNG BAEK ON 05 01 2022 AT 1949, AND HAS BEEN READ BACK.     HCT 18.6 (L) 39.0 - 52.0 %   MCV 89.4 80.0 - 100.0 fL   MCH 30.3 26.0 -  34.0 pg   MCHC 33.9 30.0 - 36.0 g/dL   RDW 16.1 (H) 09.6 - 04.5 %   Platelets 88 (L) 150 - 400 K/uL    Comment: Immature Platelet Fraction may be clinically indicated, consider ordering this additional test [id number] REPEATED TO VERIFY PLATELET COUNT CONFIRMED BY SMEAR    nRBC 2.7 (H) 0.0 - 0.2 %    Comment: Performed at [facility], [address], [place], [place] [address]  Prepare RBC (crossmatch)     Status: None   Collection Time: 05/11/20  8:01 PM  Result Value Ref Range   Order Confirmation      ORDER PROCESSED BY BLOOD BANK Performed at [facility], [address] [place], [place] [address]   Glucose, capillary     Status: Abnormal   Collection Time: 05/11/20  9:31 PM  Result Value Ref Range   Glucose-Capillary 222 (H) 70 - 99 mg/dL    Comment: Glucose reference range applies only to samples taken after fasting for at least 8 hours.  CBC     Status: Abnormal   Collection Time: 05/12/20  2:00 AM  Result Value Ref Range   WBC 12.8 (H) 4.0 - 10.5 K/uL   RBC 2.48 (L) 4.22 - 5.81 MIL/uL   Hemoglobin 7.4 (L) 13.0 - 17.0 g/dL   HCT 08.6 (L) 57.8 - 46.9 %   MCV 88.3 80.0 - 100.0 fL   MCH 29.8 26.0 - 34.0 pg   MCHC 33.8 30.0 - 36.0 g/dL   RDW 62.9 (H) 52.8 - 41.3 %   Platelets 86 (L) 150 - 400 K/uL    Comment: Immature Platelet Fraction may be clinically indicated, consider ordering this additional test [id number] CONSISTENT WITH PREVIOUS RESULT REPEATED TO VERIFY    nRBC 3.3 (H) 0.0 - 0.2 %    Comment: Performed at [facility], [address], [place], [place] [address]  Comprehensive metabolic panel     Status: Abnormal   Collection Time: 05/12/20  2:00 AM  Result Value Ref Range   Sodium 137 135 - 145 mmol/L   Potassium 3.7 3.5 - 5.1 mmol/L   Chloride 109 98 - 111 mmol/L   CO2 23 22 - 32 mmol/L   Glucose, Bld 191 (H) 70 - 99 mg/dL    Comment: Glucose reference  range applies only to samples taken after fasting for at least 8 hours.   BUN 38  (H) 8 - 23 mg/dL   Creatinine, Ser 1.61 (H) 0.61 - 1.24 mg/dL   Calcium 7.1 (L) 8.9 - 10.3 mg/dL   Total Protein 3.4 (L) 6.5 - 8.1 g/dL   Albumin 2.1 (L) 3.5 - 5.0 g/dL   AST 16 15 - 41 U/L   ALT 12 0 - 44 U/L   Alkaline Phosphatase 33 (L) 38 - 126 U/L   Total Bilirubin 0.7 0.3 - 1.2 mg/dL   GFR, Estimated 38 (L) >60 mL/min    Comment: (NOTE) Calculated using the CKD-EPI Creatinine Equation (2021)    Anion gap 5 5 - 15    Comment: Performed at [facility], [address] [place], [place] [address]  Glucose, capillary     Status: Abnormal   Collection Time: 05/12/20  6:43 AM  Result Value Ref Range   Glucose-Capillary 172 (H) 70 - 99 mg/dL    Comment: Glucose reference range applies only to samples taken after fasting for at least 8 hours.  Glucose, capillary     Status: Abnormal   Collection Time: 05/12/20 11:06 AM  Result Value Ref Range   Glucose-Capillary 214 (H) 70 - 99 mg/dL    Comment: Glucose reference range applies only to samples taken after fasting for at least 8 hours.   Comment 1 Notify RN    Comment 2 Document in Chart   Glucose, capillary     Status: Abnormal   Collection Time: 05/12/20  5:00 PM  Result Value Ref Range   Glucose-Capillary 197 (H) 70 - 99 mg/dL    Comment: Glucose reference range applies only to samples taken after fasting for at least 8 hours.   Comment 1 Notify RN    Comment 2 Document in Chart   Glucose, capillary     Status: Abnormal   Collection Time: 05/12/20  9:05 PM  Result Value Ref Range   Glucose-Capillary 208 (H) 70 - 99 mg/dL    Comment: Glucose reference range applies only to samples taken after fasting for at least 8 hours.  CBC with Differential/Platelet     Status: Abnormal   Collection Time: 05/13/20  5:20 AM  Result Value Ref Range   WBC 10.5 4.0 - 10.5 K/uL   RBC 2.09 (L) 4.22 - 5.81 MIL/uL   Hemoglobin 6.2 (LL) 13.0 - 17.0 g/dL    Comment: REPEATED TO VERIFY CRITICAL RESULT CALLED TO, READ BACK BY AND VERIFIED  WITH: MOUHAMED,A RN @ 0740 05/13/20 LEONARD,A    HCT 18.9 (L) 39.0 - 52.0 %   MCV 90.4 80.0 - 100.0 fL   MCH 29.7 26.0 - 34.0 pg   MCHC 32.8 30.0 - 36.0 g/dL   RDW 54.0 (H) 98.1 - 19.1 %   Platelets 93 (L) 150 - 400 K/uL    Comment: Immature Platelet Fraction may be clinically indicated, consider ordering this additional test [id number] CONSISTENT WITH PREVIOUS RESULT REPEATED TO VERIFY    nRBC 1.9 (H) 0.0 - 0.2 %   Neutrophils Relative % 78 %   Neutro Abs 8.2 (H) 1.7 - 7.7 K/uL   Lymphocytes Relative 10 %   Lymphs Abs 1.0 0.7 - 4.0 K/uL   Monocytes Relative 8 %   Monocytes Absolute 0.8 0.1 - 1.0 K/uL   Eosinophils Relative 3 %   Eosinophils Absolute 0.3 0.0 - 0.5 K/uL   Basophils Relative 0 %  Basophils Absolute 0.0 0.0 - 0.1 K/uL   Immature Granulocytes 1 %   Abs Immature Granulocytes 0.10 (H) 0.00 - 0.07 K/uL    Comment: Performed at [facility], [address] [place], [place] [address]  Comprehensive metabolic panel     Status: Abnormal   Collection Time: 05/13/20  5:20 AM  Result Value Ref Range   Sodium 137 135 - 145 mmol/L   Potassium 3.5 3.5 - 5.1 mmol/L   Chloride 108 98 - 111 mmol/L   CO2 23 22 - 32 mmol/L   Glucose, Bld 152 (H) 70 - 99 mg/dL    Comment: Glucose reference range applies only to samples taken after fasting for at least 8 hours.   BUN 34 (H) 8 - 23 mg/dL   Creatinine, Ser 6.04 (H) 0.61 - 1.24 mg/dL   Calcium 7.4 (L) 8.9 - 10.3 mg/dL   Total Protein 3.5 (L) 6.5 - 8.1 g/dL   Albumin 2.0 (L) 3.5 - 5.0 g/dL   AST 18 15 - 41 U/L   ALT 11 0 - 44 U/L   Alkaline Phosphatase 34 (L) 38 - 126 U/L   Total Bilirubin 0.7 0.3 - 1.2 mg/dL   GFR, Estimated 37 (L) >60 mL/min    Comment: (NOTE) Calculated using the CKD-EPI Creatinine Equation (2021)    Anion gap 6 5 - 15    Comment: Performed at [facility], [address] [place], [place] [address]  Glucose, capillary     Status: Abnormal   Collection Time: 05/13/20  5:41 AM  Result Value  Ref Range   Glucose-Capillary 151 (H) 70 - 99 mg/dL    Comment: Glucose reference range applies only to samples taken after fasting for at least 8 hours.  Type and screen [facility]     Status: None (Preliminary result)   Collection Time: 05/13/20  8:05 AM  Result Value Ref Range   ABO/RH(D) A POS    Antibody Screen NEG    Sample Expiration 05/16/2020,2359    Unit Number [id number]    Blood Component Type RBC LR PHER2    Unit division 00    Status of Unit ISSUED    Transfusion Status OK TO TRANSFUSE    Crossmatch Result      Compatible Performed at [facility], [address] [place], [place] [address]   Prepare RBC (crossmatch)     Status: None   Collection Time: 05/13/20  8:05 AM  Result Value Ref Range   Order Confirmation      ORDER PROCESSED BY BLOOD BANK Performed at [facility], [address], [place], [place] [address]   Glucose, capillary     Status: Abnormal   Collection Time: 05/13/20 11:14 AM  Result Value Ref Range   Glucose-Capillary 182 (H) 70 - 99 mg/dL    Comment: Glucose reference range applies only to samples taken after fasting for at least 8 hours.   Comment 1 Notify RN    Comment 2 Document in Chart    IR Angiogram Visceral Selective  Result Date: 05/11/2020 INDICATION: 87 year old with persistent lower GI bleeding. Recent CTA demonstrates an area of bleeding in the right colon. Small pulmonary emboli on recent CTA. Patient presents for arterial embolization and IVC filter placement. EXAM: 1. MESENTERIC ANGIOGRAPHY: SMA AND RIGHT COLIC BRANCHES 2. COIL EMBOLIZATION OF RIGHT COLIC ARTERY BRANCH 3. ULTRASOUND GUIDANCE FOR VASCULAR ACCESS 4. PLACEMENT OF IVC FILTER 5. SELECTIVE CANNULATION  OF BILATERAL RENAL VEINS MEDICATIONS: Moderate sedation ANESTHESIA/SEDATION: Moderate (conscious) sedation was employed during this procedure. A total of Versed 1.0 mg and Fentanyl 50 mcg was administered intravenously. Moderate Sedation  Time: 66 minutes. The patient's level of consciousness and vital signs were monitored continuously by radiology nursing throughout the procedure under my direct supervision. CONTRAST:  60 mL Omnipaque 300 FLUOROSCOPY TIME:  Fluoroscopy Time: 19 minutes and 18 seconds COMPLICATIONS: None immediate. PROCEDURE: Informed consent was obtained from the patient following explanation of the procedure, risks, benefits and alternatives. The patient understands, agrees and consents for the procedure. All questions were addressed. A time out was performed prior to the initiation of the procedure. Right groin was prepped and draped in sterile fashion. Maximal barrier sterile technique was utilized including caps, mask, sterile gowns, sterile gloves, sterile drape, hand hygiene and skin antiseptic. Ultrasound confirmed a patent right common femoral vein and right common femoral artery. Ultrasound image saved for documentation. Skin was anesthetized with 1% lidocaine. Small incision was made. 21 gauge needle was directed into the right common femoral vein with ultrasound guidance. Micropuncture dilator set was easily advanced into the vein. Tract was dilated to accommodate the IVC filter sheath. IVC venogram was performed using carbon dioxide. Bilateral renal veins were cannulated with a C2 catheter and Bentson wire. IVC filter was deployed below the renal veins. The venous sheath was removed with manual compression. Another small incision was made. 21 gauge needle was directed into the right common femoral artery with ultrasound guidance. Micropuncture dilator set was placed. Five [place] vascular sheath was placed over a Bentson wire. A C2 catheter was used to cannulate the SMA. SMA arteriography was performed. STC microcatheter was advanced into the right colic artery and additional angiography was performed. Area of bleeding in the ascending colon near the hepatic flexure was identified. Microcatheter was advanced into the  feeding artery using a 0.014 Fathom wire. Catheter placement was confirmed within the branch supplying the area of bleeding. Two 2 mm soft IDC Interlock coils were deployed within the feeding artery. Follow-up angiography demonstrated a small proximal branch that could potentially be supplying the bleeder. A 2 mm low profile Ruby coil was deployed at this branch vessel. Final angiography was performed through the microcatheter. Microcatheter and 5 French catheter removed without complication. Angiogram was performed through the right groin sheath. The right groin sheath was removed using an Angio-Seal closure device. Right groin hemostasis at the end of the procedure. FINDINGS: 1. IVC was patent on the carbon dioxide venogram. Bilateral renal veins were cannulated with the Bentson wire. Filter was deployed below the renal veins. 2. SMA angiography demonstrates a patent SMA with subtle area of bleeding in the ascending colon near the hepatic flexure. This finding corresponded with the area of contrast extravasation on the recent CTA. Microcatheter was advanced into distal right colic artery branches and the area of bleeding was confirmed. Transverse branch supplying the area of bleeding was successfully cannulated and this branch and a small branch coming off it were embolized using 2 mm coils. No evidence for active bleeding following coil embolization. IMPRESSION: 1. Successful coil embolization of a distal right colic artery branch supplying the area of bleeding in the ascending colon near the hepatic flexure. 2. Placement of IVC filter for pulmonary emboli. IVC filter is potentially retrievable. Electronically Signed   By: Richarda Overlie M.D.   On: 05/11/2020 17:42   IR Angiogram Selective Each Additional Vessel  Result Date: 05/11/2020 INDICATION: 87 year old with persistent  lower GI bleeding. Recent CTA demonstrates an area of bleeding in the right colon. Small pulmonary emboli on recent CTA. Patient presents  for arterial embolization and IVC filter placement. EXAM: 1. MESENTERIC ANGIOGRAPHY: SMA AND RIGHT COLIC BRANCHES 2. COIL EMBOLIZATION OF RIGHT COLIC ARTERY BRANCH 3. ULTRASOUND GUIDANCE FOR VASCULAR ACCESS 4. PLACEMENT OF IVC FILTER 5. SELECTIVE CANNULATION OF BILATERAL RENAL VEINS MEDICATIONS: Moderate sedation ANESTHESIA/SEDATION: Moderate (conscious) sedation was employed during this procedure. A total of Versed 1.0 mg and Fentanyl 50 mcg was administered intravenously. Moderate Sedation Time: 66 minutes. The patient's level of consciousness and vital signs were monitored continuously by radiology nursing throughout the procedure under my direct supervision. CONTRAST:  60 mL Omnipaque 300 FLUOROSCOPY TIME:  Fluoroscopy Time: 19 minutes and 18 seconds COMPLICATIONS: None immediate. PROCEDURE: Informed consent was obtained from the patient following explanation of the procedure, risks, benefits and alternatives. The patient understands, agrees and consents for the procedure. All questions were addressed. A time out was performed prior to the initiation of the procedure. Right groin was prepped and draped in sterile fashion. Maximal barrier sterile technique was utilized including caps, mask, sterile gowns, sterile gloves, sterile drape, hand hygiene and skin antiseptic. Ultrasound confirmed a patent right common femoral vein and right common femoral artery. Ultrasound image saved for documentation. Skin was anesthetized with 1% lidocaine. Small incision was made. 21 gauge needle was directed into the right common femoral vein with ultrasound guidance. Micropuncture dilator set was easily advanced into the vein. Tract was dilated to accommodate the IVC filter sheath. IVC venogram was performed using carbon dioxide. Bilateral renal veins were cannulated with a C2 catheter and Bentson wire. IVC filter was deployed below the renal veins. The venous sheath was removed with manual compression. Another small incision was  made. 21 gauge needle was directed into the right common femoral artery with ultrasound guidance. Micropuncture dilator set was placed. Five [place] vascular sheath was placed over a Bentson wire. A C2 catheter was used to cannulate the SMA. SMA arteriography was performed. STC microcatheter was advanced into the right colic artery and additional angiography was performed. Area of bleeding in the ascending colon near the hepatic flexure was identified. Microcatheter was advanced into the feeding artery using a 0.014 Fathom wire. Catheter placement was confirmed within the branch supplying the area of bleeding. Two 2 mm soft IDC Interlock coils were deployed within the feeding artery. Follow-up angiography demonstrated a small proximal branch that could potentially be supplying the bleeder. A 2 mm low profile Ruby coil was deployed at this branch vessel. Final angiography was performed through the microcatheter. Microcatheter and 5 French catheter removed without complication. Angiogram was performed through the right groin sheath. The right groin sheath was removed using an Angio-Seal closure device. Right groin hemostasis at the end of the procedure. FINDINGS: 1. IVC was patent on the carbon dioxide venogram. Bilateral renal veins were cannulated with the Bentson wire. Filter was deployed below the renal veins. 2. SMA angiography demonstrates a patent SMA with subtle area of bleeding in the ascending colon near the hepatic flexure. This finding corresponded with the area of contrast extravasation on the recent CTA. Microcatheter was advanced into distal right colic artery branches and the area of bleeding was confirmed. Transverse branch supplying the area of bleeding was successfully cannulated and this branch and a small branch coming off it were embolized using 2 mm coils. No evidence for active bleeding following coil embolization. IMPRESSION: 1. Successful coil embolization of a distal  right colic artery branch  supplying the area of bleeding in the ascending colon near the hepatic flexure. 2. Placement of IVC filter for pulmonary emboli. IVC filter is potentially retrievable. Electronically Signed   By: Richarda Overlie M.D.   On: 05/11/2020 17:42   IR Venogram Renal Bi  Result Date: 05/11/2020 INDICATION: 87 year old with persistent lower GI bleeding. Recent CTA demonstrates an area of bleeding in the right colon. Small pulmonary emboli on recent CTA. Patient presents for arterial embolization and IVC filter placement. EXAM: 1. MESENTERIC ANGIOGRAPHY: SMA AND RIGHT COLIC BRANCHES 2. COIL EMBOLIZATION OF RIGHT COLIC ARTERY BRANCH 3. ULTRASOUND GUIDANCE FOR VASCULAR ACCESS 4. PLACEMENT OF IVC FILTER 5. SELECTIVE CANNULATION OF BILATERAL RENAL VEINS MEDICATIONS: Moderate sedation ANESTHESIA/SEDATION: Moderate (conscious) sedation was employed during this procedure. A total of Versed 1.0 mg and Fentanyl 50 mcg was administered intravenously. Moderate Sedation Time: 66 minutes. The patient's level of consciousness and vital signs were monitored continuously by radiology nursing throughout the procedure under my direct supervision. CONTRAST:  60 mL Omnipaque 300 FLUOROSCOPY TIME:  Fluoroscopy Time: 19 minutes and 18 seconds COMPLICATIONS: None immediate. PROCEDURE: Informed consent was obtained from the patient following explanation of the procedure, risks, benefits and alternatives. The patient understands, agrees and consents for the procedure. All questions were addressed. A time out was performed prior to the initiation of the procedure. Right groin was prepped and draped in sterile fashion. Maximal barrier sterile technique was utilized including caps, mask, sterile gowns, sterile gloves, sterile drape, hand hygiene and skin antiseptic. Ultrasound confirmed a patent right common femoral vein and right common femoral artery. Ultrasound image saved for documentation. Skin was anesthetized with 1% lidocaine. Small incision was  made. 21 gauge needle was directed into the right common femoral vein with ultrasound guidance. Micropuncture dilator set was easily advanced into the vein. Tract was dilated to accommodate the IVC filter sheath. IVC venogram was performed using carbon dioxide. Bilateral renal veins were cannulated with a C2 catheter and Bentson wire. IVC filter was deployed below the renal veins. The venous sheath was removed with manual compression. Another small incision was made. 21 gauge needle was directed into the right common femoral artery with ultrasound guidance. Micropuncture dilator set was placed. Five [place] vascular sheath was placed over a Bentson wire. A C2 catheter was used to cannulate the SMA. SMA arteriography was performed. STC microcatheter was advanced into the right colic artery and additional angiography was performed. Area of bleeding in the ascending colon near the hepatic flexure was identified. Microcatheter was advanced into the feeding artery using a 0.014 Fathom wire. Catheter placement was confirmed within the branch supplying the area of bleeding. Two 2 mm soft IDC Interlock coils were deployed within the feeding artery. Follow-up angiography demonstrated a small proximal branch that could potentially be supplying the bleeder. A 2 mm low profile Ruby coil was deployed at this branch vessel. Final angiography was performed through the microcatheter. Microcatheter and 5 French catheter removed without complication. Angiogram was performed through the right groin sheath. The right groin sheath was removed using an Angio-Seal closure device. Right groin hemostasis at the end of the procedure. FINDINGS: 1. IVC was patent on the carbon dioxide venogram. Bilateral renal veins were cannulated with the Bentson wire. Filter was deployed below the renal veins. 2. SMA angiography demonstrates a patent SMA with subtle area of bleeding in the ascending colon near the hepatic flexure. This finding corresponded  with the area of contrast extravasation on the recent  CTA. Microcatheter was advanced into distal right colic artery branches and the area of bleeding was confirmed. Transverse branch supplying the area of bleeding was successfully cannulated and this branch and a small branch coming off it were embolized using 2 mm coils. No evidence for active bleeding following coil embolization. IMPRESSION: 1. Successful coil embolization of a distal right colic artery branch supplying the area of bleeding in the ascending colon near the hepatic flexure. 2. Placement of IVC filter for pulmonary emboli. IVC filter is potentially retrievable. Electronically Signed   By: Richarda OverlieAdam  Henn M.D.   On: 05/11/2020 17:42   IR IVC FILTER PLMT / S&I Lenise Arena/IMG GUID/MOD SED  Result Date: 05/11/2020 INDICATION: 87 year old with persistent lower GI bleeding. Recent CTA demonstrates an area of bleeding in the right colon. Small pulmonary emboli on recent CTA. Patient presents for arterial embolization and IVC filter placement. EXAM: 1. MESENTERIC ANGIOGRAPHY: SMA AND RIGHT COLIC BRANCHES 2. COIL EMBOLIZATION OF RIGHT COLIC ARTERY BRANCH 3. ULTRASOUND GUIDANCE FOR VASCULAR ACCESS 4. PLACEMENT OF IVC FILTER 5. SELECTIVE CANNULATION OF BILATERAL RENAL VEINS MEDICATIONS: Moderate sedation ANESTHESIA/SEDATION: Moderate (conscious) sedation was employed during this procedure. A total of Versed 1.0 mg and Fentanyl 50 mcg was administered intravenously. Moderate Sedation Time: 66 minutes. The patient's level of consciousness and vital signs were monitored continuously by radiology nursing throughout the procedure under my direct supervision. CONTRAST:  60 mL Omnipaque 300 FLUOROSCOPY TIME:  Fluoroscopy Time: 19 minutes and 18 seconds COMPLICATIONS: None immediate. PROCEDURE: Informed consent was obtained from the patient following explanation of the procedure, risks, benefits and alternatives. The patient understands, agrees and consents for the procedure. All  questions were addressed. A time out was performed prior to the initiation of the procedure. Right groin was prepped and draped in sterile fashion. Maximal barrier sterile technique was utilized including caps, mask, sterile gowns, sterile gloves, sterile drape, hand hygiene and skin antiseptic. Ultrasound confirmed a patent right common femoral vein and right common femoral artery. Ultrasound image saved for documentation. Skin was anesthetized with 1% lidocaine. Small incision was made. 21 gauge needle was directed into the right common femoral vein with ultrasound guidance. Micropuncture dilator set was easily advanced into the vein. Tract was dilated to accommodate the IVC filter sheath. IVC venogram was performed using carbon dioxide. Bilateral renal veins were cannulated with a C2 catheter and Bentson wire. IVC filter was deployed below the renal veins. The venous sheath was removed with manual compression. Another small incision was made. 21 gauge needle was directed into the right common femoral artery with ultrasound guidance. Micropuncture dilator set was placed. Five [place] vascular sheath was placed over a Bentson wire. A C2 catheter was used to cannulate the SMA. SMA arteriography was performed. STC microcatheter was advanced into the right colic artery and additional angiography was performed. Area of bleeding in the ascending colon near the hepatic flexure was identified. Microcatheter was advanced into the feeding artery using a 0.014 Fathom wire. Catheter placement was confirmed within the branch supplying the area of bleeding. Two 2 mm soft IDC Interlock coils were deployed within the feeding artery. Follow-up angiography demonstrated a small proximal branch that could potentially be supplying the bleeder. A 2 mm low profile Ruby coil was deployed at this branch vessel. Final angiography was performed through the microcatheter. Microcatheter and 5 French catheter removed without complication.  Angiogram was performed through the right groin sheath. The right groin sheath was removed using an Angio-Seal closure device. Right groin hemostasis at  the end of the procedure. FINDINGS: 1. IVC was patent on the carbon dioxide venogram. Bilateral renal veins were cannulated with the Bentson wire. Filter was deployed below the renal veins. 2. SMA angiography demonstrates a patent SMA with subtle area of bleeding in the ascending colon near the hepatic flexure. This finding corresponded with the area of contrast extravasation on the recent CTA. Microcatheter was advanced into distal right colic artery branches and the area of bleeding was confirmed. Transverse branch supplying the area of bleeding was successfully cannulated and this branch and a small branch coming off it were embolized using 2 mm coils. No evidence for active bleeding following coil embolization. IMPRESSION: 1. Successful coil embolization of a distal right colic artery branch supplying the area of bleeding in the ascending colon near the hepatic flexure. 2. Placement of IVC filter for pulmonary emboli. IVC filter is potentially retrievable. Electronically Signed   By: Richarda Overlie M.D.   On: 05/11/2020 17:42   IR US Guide Vasc Access Right  Result Date: 05/11/2020 INDICATION: 87 year old with persistent lower GI bleeding. Recent CTA demonstrates an area of bleeding in the right colon. Small pulmonary emboli on recent CTA. Patient presents for arterial embolization and IVC filter placement. EXAM: 1. MESENTERIC ANGIOGRAPHY: SMA AND RIGHT COLIC BRANCHES 2. COIL EMBOLIZATION OF RIGHT COLIC ARTERY BRANCH 3. ULTRASOUND GUIDANCE FOR VASCULAR ACCESS 4. PLACEMENT OF IVC FILTER 5. SELECTIVE CANNULATION OF BILATERAL RENAL VEINS MEDICATIONS: Moderate sedation ANESTHESIA/SEDATION: Moderate (conscious) sedation was employed during this procedure. A total of Versed 1.0 mg and Fentanyl 50 mcg was administered intravenously. Moderate Sedation Time: 66 minutes.  The patient's level of consciousness and vital signs were monitored continuously by radiology nursing throughout the procedure under my direct supervision. CONTRAST:  60 mL Omnipaque 300 FLUOROSCOPY TIME:  Fluoroscopy Time: 19 minutes and 18 seconds COMPLICATIONS: None immediate. PROCEDURE: Informed consent was obtained from the patient following explanation of the procedure, risks, benefits and alternatives. The patient understands, agrees and consents for the procedure. All questions were addressed. A time out was performed prior to the initiation of the procedure. Right groin was prepped and draped in sterile fashion. Maximal barrier sterile technique was utilized including caps, mask, sterile gowns, sterile gloves, sterile drape, hand hygiene and skin antiseptic. Ultrasound confirmed a patent right common femoral vein and right common femoral artery. Ultrasound image saved for documentation. Skin was anesthetized with 1% lidocaine. Small incision was made. 21 gauge needle was directed into the right common femoral vein with ultrasound guidance. Micropuncture dilator set was easily advanced into the vein. Tract was dilated to accommodate the IVC filter sheath. IVC venogram was performed using carbon dioxide. Bilateral renal veins were cannulated with a C2 catheter and Bentson wire. IVC filter was deployed below the renal veins. The venous sheath was removed with manual compression. Another small incision was made. 21 gauge needle was directed into the right common femoral artery with ultrasound guidance. Micropuncture dilator set was placed. Five [place] vascular sheath was placed over a Bentson wire. A C2 catheter was used to cannulate the SMA. SMA arteriography was performed. STC microcatheter was advanced into the right colic artery and additional angiography was performed. Area of bleeding in the ascending colon near the hepatic flexure was identified. Microcatheter was advanced into the feeding artery using a  0.014 Fathom wire. Catheter placement was confirmed within the branch supplying the area of bleeding. Two 2 mm soft IDC Interlock coils were deployed within the feeding artery. Follow-up angiography demonstrated a small  proximal branch that could potentially be supplying the bleeder. A 2 mm low profile Ruby coil was deployed at this branch vessel. Final angiography was performed through the microcatheter. Microcatheter and 5 French catheter removed without complication. Angiogram was performed through the right groin sheath. The right groin sheath was removed using an Angio-Seal closure device. Right groin hemostasis at the end of the procedure. FINDINGS: 1. IVC was patent on the carbon dioxide venogram. Bilateral renal veins were cannulated with the Bentson wire. Filter was deployed below the renal veins. 2. SMA angiography demonstrates a patent SMA with subtle area of bleeding in the ascending colon near the hepatic flexure. This finding corresponded with the area of contrast extravasation on the recent CTA. Microcatheter was advanced into distal right colic artery branches and the area of bleeding was confirmed. Transverse branch supplying the area of bleeding was successfully cannulated and this branch and a small branch coming off it were embolized using 2 mm coils. No evidence for active bleeding following coil embolization. IMPRESSION: 1. Successful coil embolization of a distal right colic artery branch supplying the area of bleeding in the ascending colon near the hepatic flexure. 2. Placement of IVC filter for pulmonary emboli. IVC filter is potentially retrievable. Electronically Signed   By: Richarda Overlie M.D.   On: 05/11/2020 17:42   ECHOCARDIOGRAM COMPLETE  Result Date: 05/13/2020    ECHOCARDIOGRAM REPORT   Patient Name:   Thomas Joseph Date of Exam: 05/11/2020 Medical Rec #:  [id number]         Height:       73.0 in Accession #:    [id number]        Weight:       224.9 lb Date of Birth:  [birth year]          BSA:          2.262 m Patient Age:    86 years          BP:           145/41 mmHg Patient Gender: M                 HR:           85 bpm. Exam Location:  Inpatient Procedure: 2D Echo, Cardiac Doppler and Color Doppler Indications:    Atrial fibrillation  History:        Patient has no prior history of Echocardiogram examinations.                 Risk Factors:Hypertension, Diabetes, Dyslipidemia and Former                 Smoker. Hx COVID-19. CKD.  Sonographer:    Ross Ludwig RDCS (AE) Referring Phys: [id number] [facility] [employer] IMPRESSIONS  1. Left ventricular ejection fraction, by estimation, is 70 to 75%. The left ventricle has hyperdynamic function. The left ventricle has no regional wall motion abnormalities. There is severe concentric left ventricular hypertrophy. Left ventricular diastolic parameters are consistent with Grade I diastolic dysfunction (impaired relaxation).  2. Right ventricular systolic function is normal. The right ventricular size is normal. There is moderately elevated pulmonary artery systolic pressure. The estimated right ventricular systolic pressure is 47.4 mmHg.  3. The pericardial effusion is circumferential. There is no evidence of cardiac tamponade.  4. The mitral valve is normal in structure. No evidence of mitral valve regurgitation. No evidence of mitral stenosis.  5. Tricuspid valve regurgitation is mild to moderate.  6. The aortic  valve is tricuspid. There is mild calcification of the aortic valve. There is mild thickening of the aortic valve. Aortic valve regurgitation is not visualized. Mild aortic valve sclerosis is present, with no evidence of aortic valve stenosis.  7. The inferior vena cava is normal in size with greater than 50% respiratory variability, suggesting right atrial pressure of 3 mmHg. FINDINGS  Left Ventricle: Left ventricular ejection fraction, by estimation, is 70 to 75%. The left ventricle has hyperdynamic function. The left ventricle has no regional wall  motion abnormalities. The left ventricular internal cavity size was normal in size. There is severe concentric left ventricular hypertrophy. Left ventricular diastolic parameters are consistent with Grade I diastolic dysfunction (impaired relaxation). Right Ventricle: The right ventricular size is normal. No increase in right ventricular wall thickness. Right ventricular systolic function is normal. There is moderately elevated pulmonary artery systolic pressure. The tricuspid regurgitant velocity is 3.14 m/s, and with an assumed right atrial pressure of 8 mmHg, the estimated right ventricular systolic pressure is 47.4 mmHg. Left Atrium: Left atrial size was normal in size. Right Atrium: Right atrial size was normal in size. Pericardium: Trivial pericardial effusion is present. The pericardial effusion is circumferential. There is no evidence of cardiac tamponade. Mitral Valve: The mitral valve is normal in structure. No evidence of mitral valve regurgitation. No evidence of mitral valve stenosis. Tricuspid Valve: The tricuspid valve is normal in structure. Tricuspid valve regurgitation is mild to moderate. No evidence of tricuspid stenosis. Aortic Valve: The aortic valve is tricuspid. There is mild calcification of the aortic valve. There is mild thickening of the aortic valve. Aortic valve regurgitation is not visualized. Mild aortic valve sclerosis is present, with no evidence of aortic valve stenosis. Aortic valve mean gradient measures 3.0 mmHg. Aortic valve peak gradient measures 5.8 mmHg. Aortic valve area, by VTI measures 3.43 cm. Pulmonic Valve: The pulmonic valve was normal in structure. Pulmonic valve regurgitation is not visualized. No evidence of pulmonic stenosis. Aorta: The aortic root and ascending aorta are structurally normal, with no evidence of dilitation. Venous: The inferior vena cava is normal in size with greater than 50% respiratory variability, suggesting right atrial pressure of 3 mmHg.  IAS/Shunts: No atrial level shunt detected by color flow Doppler.  LEFT VENTRICLE PLAX 2D LVIDd:         3.70 cm LVIDs:         2.20 cm LV PW:         2.30 cm LV IVS:        2.30 cm LVOT diam:     2.10 cm LV SV:         81 LV SV Index:   36 LVOT Area:     3.46 cm  RIGHT VENTRICLE             IVC RV Basal diam:  2.70 cm     IVC diam: 1.30 cm RV S prime:     16.80 cm/s TAPSE (M-mode): 2.3 cm LEFT ATRIUM             Index       RIGHT ATRIUM           Index LA diam:        2.60 cm 1.15 cm/m  RA Area:     16.90 cm LA Vol (A2C):   76.2 ml 33.69 ml/m RA Volume:   38.70 ml  17.11 ml/m LA Vol (A4C):   60.5 ml 26.75 ml/m LA Biplane Vol: 72.3 ml  31.97 ml/m  AORTIC VALVE AV Area (Vmax):    3.32 cm AV Area (Vmean):   3.08 cm AV Area (VTI):     3.43 cm AV Vmax:           120.00 cm/s AV Vmean:          85.200 cm/s AV VTI:            0.236 m AV Peak Grad:      5.8 mmHg AV Mean Grad:      3.0 mmHg LVOT Vmax:         115.00 cm/s LVOT Vmean:        75.800 cm/s LVOT VTI:          0.234 m LVOT/AV VTI ratio: 0.99  AORTA Ao Root diam: 3.60 cm Ao Asc diam:  3.10 cm TRICUSPID VALVE TR Peak grad:   39.4 mmHg TR Vmax:        314.00 cm/s  SHUNTS Systemic VTI:  0.23 m Systemic Diam: 2.10 cm Thomas Schultz MD Electronically signed by Thomas Schultz MD Signature Date/Time: 05/13/2020/10:17:41 AM    Final    IR EMBO ART  VEN HEMORR LYMPH EXTRAV  INC GUIDE ROADMAPPING  Result Date: 05/11/2020 INDICATION: 87 year old with persistent lower GI bleeding. Recent CTA demonstrates an area of bleeding in the right colon. Small pulmonary emboli on recent CTA. Patient presents for arterial embolization and IVC filter placement. EXAM: 1. MESENTERIC ANGIOGRAPHY: SMA AND RIGHT COLIC BRANCHES 2. COIL EMBOLIZATION OF RIGHT COLIC ARTERY BRANCH 3. ULTRASOUND GUIDANCE FOR VASCULAR ACCESS 4. PLACEMENT OF IVC FILTER 5. SELECTIVE CANNULATION OF BILATERAL RENAL VEINS MEDICATIONS: Moderate sedation ANESTHESIA/SEDATION: Moderate (conscious) sedation was employed  during this procedure. A total of Versed 1.0 mg and Fentanyl 50 mcg was administered intravenously. Moderate Sedation Time: 66 minutes. The patient's level of consciousness and vital signs were monitored continuously by radiology nursing throughout the procedure under my direct supervision. CONTRAST:  60 mL Omnipaque 300 FLUOROSCOPY TIME:  Fluoroscopy Time: 19 minutes and 18 seconds COMPLICATIONS: None immediate. PROCEDURE: Informed consent was obtained from the patient following explanation of the procedure, risks, benefits and alternatives. The patient understands, agrees and consents for the procedure. All questions were addressed. A time out was performed prior to the initiation of the procedure. Right groin was prepped and draped in sterile fashion. Maximal barrier sterile technique was utilized including caps, mask, sterile gowns, sterile gloves, sterile drape, hand hygiene and skin antiseptic. Ultrasound confirmed a patent right common femoral vein and right common femoral artery. Ultrasound image saved for documentation. Skin was anesthetized with 1% lidocaine. Small incision was made. 21 gauge needle was directed into the right common femoral vein with ultrasound guidance. Micropuncture dilator set was easily advanced into the vein. Tract was dilated to accommodate the IVC filter sheath. IVC venogram was performed using carbon dioxide. Bilateral renal veins were cannulated with a C2 catheter and Bentson wire. IVC filter was deployed below the renal veins. The venous sheath was removed with manual compression. Another small incision was made. 21 gauge needle was directed into the right common femoral artery with ultrasound guidance. Micropuncture dilator set was placed. Five [place] vascular sheath was placed over a Bentson wire. A C2 catheter was used to cannulate the SMA. SMA arteriography was performed. STC microcatheter was advanced into the right colic artery and additional angiography was performed.  Area of bleeding in the ascending colon near the hepatic flexure was identified. Microcatheter was advanced into the feeding artery using a  0.014 Fathom wire. Catheter placement was confirmed within the branch supplying the area of bleeding. Two 2 mm soft IDC Interlock coils were deployed within the feeding artery. Follow-up angiography demonstrated a small proximal branch that could potentially be supplying the bleeder. A 2 mm low profile Ruby coil was deployed at this branch vessel. Final angiography was performed through the microcatheter. Microcatheter and 5 French catheter removed without complication. Angiogram was performed through the right groin sheath. The right groin sheath was removed using an Angio-Seal closure device. Right groin hemostasis at the end of the procedure. FINDINGS: 1. IVC was patent on the carbon dioxide venogram. Bilateral renal veins were cannulated with the Bentson wire. Filter was deployed below the renal veins. 2. SMA angiography demonstrates a patent SMA with subtle area of bleeding in the ascending colon near the hepatic flexure. This finding corresponded with the area of contrast extravasation on the recent CTA. Microcatheter was advanced into distal right colic artery branches and the area of bleeding was confirmed. Transverse branch supplying the area of bleeding was successfully cannulated and this branch and a small branch coming off it were embolized using 2 mm coils. No evidence for active bleeding following coil embolization. IMPRESSION: 1. Successful coil embolization of a distal right colic artery branch supplying the area of bleeding in the ascending colon near the hepatic flexure. 2. Placement of IVC filter for pulmonary emboli. IVC filter is potentially retrievable. Electronically Signed   By: Richarda Overlie M.D.   On: 05/11/2020 17:42   VAS [place] LOWER EXTREMITY VENOUS (DVT)  Result Date: 05/12/2020  Lower Venous DVT Study Patient Name:  Thomas Joseph  Date of Exam:    05/11/2020 Medical Rec #: [id number]          Accession #:    [id number] Date of Birth: Jun 29, 1933          Patient Gender: M Patient Age:   65Y Exam Location:  [facility] Procedure:      VAS [place] LOWER EXTREMITY VENOUS (DVT) Referring Phys: [id number] [facility] SAMTANI --------------------------------------------------------------------------------  Indications: Pulmonary embolism.  Risk Factors: Patient had IVC filter placed today. GI bleed. Limitations: Patient unable to move legs secondary to needing to remain flat post procedure. and bandages. Comparison Study: No prior study on file Performing Technologist: Sherren Kerns RVS  Examination Guidelines: A complete evaluation includes B-mode imaging, spectral Doppler, color Doppler, and power Doppler as needed of all accessible portions of each vessel. Bilateral testing is considered an integral part of a complete examination. Limited examinations for reoccurring indications may be performed as noted. The reflux portion of the exam is performed with the patient in reverse Trendelenburg.  +---------+---------------+---------+-----------+----------+-------------------+ RIGHT    CompressibilityPhasicitySpontaneityPropertiesThrombus Aging      +---------+---------------+---------+-----------+----------+-------------------+ CFV                     Yes      Yes                  patent by color and                                                       Doppler             +---------+---------------+---------+-----------+----------+-------------------+ SFJ  bandage in way      +---------+---------------+---------+-----------+----------+-------------------+ FV Prox  Full                                                             +---------+---------------+---------+-----------+----------+-------------------+ FV Mid   Full                                                              +---------+---------------+---------+-----------+----------+-------------------+ FV DistalFull                                                             +---------+---------------+---------+-----------+----------+-------------------+ PFV      Full                                                             +---------+---------------+---------+-----------+----------+-------------------+ POP                     Yes      Yes                  patent by color and                                                       Doppler             +---------+---------------+---------+-----------+----------+-------------------+ PTV      Full                                                             +---------+---------------+---------+-----------+----------+-------------------+ PERO     None                                         Age Indeterminate   +---------+---------------+---------+-----------+----------+-------------------+   +---------+---------------+---------+-----------+----------+-------------------+ LEFT     CompressibilityPhasicitySpontaneityPropertiesThrombus Aging      +---------+---------------+---------+-----------+----------+-------------------+ CFV      Full           Yes      Yes                                      +---------+---------------+---------+-----------+----------+-------------------+ SFJ      Full                                                             +---------+---------------+---------+-----------+----------+-------------------+  FV Prox  Full                                                             +---------+---------------+---------+-----------+----------+-------------------+ FV Mid   Full                                                             +---------+---------------+---------+-----------+----------+-------------------+ FV DistalFull                                                              +---------+---------------+---------+-----------+----------+-------------------+ PFV      Full                                                             +---------+---------------+---------+-----------+----------+-------------------+ POP                     Yes      Yes                  patent by color and                                                       Doppler             +---------+---------------+---------+-----------+----------+-------------------+ PTV      None                                         Age Indeterminate   +---------+---------------+---------+-----------+----------+-------------------+ PERO     None                                         Age Indeterminate   +---------+---------------+---------+-----------+----------+-------------------+     Summary: RIGHT: - Findings consistent with age indeterminate deep vein thrombosis involving the right peroneal veins.  LEFT: - Findings consistent with age indeterminate deep vein thrombosis involving the left posterior tibial veins, and left peroneal veins.  *See table(s) above for measurements and observations. Electronically signed by Fabienne Bruns MD on 05/12/2020 at 2:32:16 PM.    Final    CT Angio Abd/Pel w/ and/or w/o  Result Date: 05/13/2020 CLINICAL DATA:  Status post successful transcatheter coil embolization of a right colonic diverticular GI bleed on 05/11/2020. Drop in hemoglobin overnight. EXAM: CT ANGIOGRAPHY ABDOMEN AND PELVIS WITH CONTRAST AND WITHOUT CONTRAST TECHNIQUE: Multidetector CT imaging of the abdomen  and pelvis was performed using the standard protocol during bolus administration of intravenous contrast. Multiplanar reconstructed images and MIPs were obtained and reviewed to evaluate the vascular anatomy. CONTRAST:  75mL OMNIPAQUE IOHEXOL 350 MG/ML SOLN COMPARISON:  Prior CTA of the abdomen and pelvis and arteriographic procedure on 05/11/2020 FINDINGS: VASCULAR Aorta: Normal abdominal  aorta without evidence atherosclerosis, aneurysm or dissection. Celiac: Normally patent. Normally patent branch vessels and branch vessel anatomy. SMA: Normally patent. Renals: Bilateral paired renal artery supply is stable and demonstrates normal patency. IMA: Normally patent. Inflow: Normally patent bilateral iliac arteries without aneurysm or significant obstructive disease. Proximal Outflow: Normally patent bilateral common femoral arteries and femoral bifurcations. No evidence of pseudoaneurysm after recent arteriographic procedure from right femoral access. Veins: Venous phase imaging demonstrates no venous abnormalities. Stable appearance of indwelling infrarenal IVC filter. Review of the MIP images confirms the above findings. NON-VASCULAR Lower chest: Small hiatal hernia. Hepatobiliary: Stable scattered small hepatic cysts. The gallbladder is unremarkable. No biliary ductal dilatation. Pancreas: Unremarkable. No pancreatic ductal dilatation or surrounding inflammatory changes. Spleen: Normal in size without focal abnormality. Adrenals/Urinary Tract: Stable renal cysts bilaterally including large upper pole right renal cyst. No hydronephrosis. The bladder contains high density urine related to excretion of contrast. Stomach/Bowel: On the current study, there is some higher density material present in the colon prior to contrast administration which limits evaluation for GI bleed. This material does not change in appearance on the arterial and venous phases of imaging after contrast administration and obvious active gastrointestinal bleeding is not identified by CTA. Coils are present in distal right colic branches. No evidence of bowel obstruction, inflammation or free intraperitoneal air. Normal appendix. Lymphatic: No enlarged abdominal or pelvic lymph nodes. Reproductive: Prostate is unremarkable. Other: No retroperitoneal, intraperitoneal or groin hemorrhage. No hernias, ascites or focal fluid  collections. Musculoskeletal: No acute or significant osseous findings. IMPRESSION: 1. No hemorrhagic complication evident after recent arteriography and embolization of a right colonic diverticular arterial bleed. 2. Although there is some higher density material in the colon on the current study which limits evaluation for intraluminal bleeding, there is no visible change in appearance of intraluminal material on arterial and venous phases of imaging to suggest obvious active gastrointestinal bleeding currently. Electronically Signed   By: Irish Lack M.D.   On: 05/13/2020 13:18   Anti-infectives (From admission, onward)   None       Assessment/Plan 2.4 cm subtle hypoattenuating lesion in the uncinate process of the pancreas - may need further workup with MRI as outpatient  Hx hypertension Hx hyperlipidemia Hx of IDDM  PE s/p IVC filter placement on 5/1 by Dr. Lowella Dandy  ABL anemia 2/2 GI bleed - Admitted 4/25 w/ new onset melena  - EGD on 4/26 that showed medium-sized hiatal hernia but was negative for bleeding. - Bleeding scan on 4/27 that showed active bleeding probably from the ascending colon - Colonoscopy on 4/27 showed poor prep with coffee-ground/old blood in colon and no evidence of active bleeding.  - CT angio on 4/28 negative for bleeding.  - Repeat colonoscopy on 5/1 without revealing source of active bleeding.  - Follow-up CTA on 5/1 was positive for active bleeding in the right colon.  - Patient underwent IR mesenteric arteriogram with coil embolization of right colic artery branch by Dr. Lowella Dandy on 5/1.  - Despite embolization patient's hemoglobin dropped and was 6.2 this morning (getting 1U PRBC). He denies melena or hematochezia since IR procedure. Repeat CTA today, 5/3, showed no visible  active GI bleed - although there reported some higher density material in the colon limited evaluation.   - If patient continues to drop hgb would recommend surgery. Patient is at high risk  for peri-operative complications due to his age, medical co-morbidities, recent diagnosis of PE this hospitalization. I discussed with him and his wife these risks and the option of not proceeding with surgery. We discussed that if surgery is indicated, but not chosen, he will likely have continued episodes of bleeding, some of which may be managed with blood transfusions, but that he potentially may develop acute, massive blood loss resulting in cardiopulmonary arrest. I encouraged them to discuss with their six children and counseled that in light of a confirmatory angio post embolization, persistent bleeding may warrant a total abdominal colectomy and end ileostomy as opposed to R hemicolectomy. We discussed the morbidity of this type of procedure as well. All their questions were answered to their satisfaction. Will trend hgb. NPO at midnight in case of surgical indication.    Diamantina Monks, MD General and Trauma Surgery [facility]  05/13/2020, 1:42 PM Please see Amion for pager number during day hours 7:00am-4:30pm

## 2020-05-13 NOTE — Progress Notes (Signed)
HOSPITAL MEDICINE OVERNIGHT EVENT NOTE    Notified by nursing that patient's hemoglobin this morning has decreased to 6.2.  Nursing reports the patient is not exhibiting any evidence of continued frank bleeding.  Patient denies chest pain shortness of breath or abdominal pain.  Patient is hemodynamically stable.  Will order an additional 1 unit packed red blood cell transfusion and obtain posttransfusion H&H.  Marinda Elk  MD [facility]

## 2020-05-13 NOTE — Progress Notes (Signed)
[facility] Gastroenterology Progress Note  Thomas Joseph 87 y.o. [birth year]  CC: GI bleed   Subjective: Patient seen and examined at bedside.  Sitting comfortably in chair.  RN at bedside.  Drop in hemoglobin noted but no overt bleeding.  Bruise over bilateral upper extremity noted, according to patient has been present for 2 days.  Denies abdominal pain, nausea and vomiting.  ROS : Afebrile, negative for chest pain.   Objective: Vital signs in last 24 hours: Vitals:   05/13/20 0715 05/13/20 0800  BP: (!) 143/66 139/63  Pulse: 78 73  Resp: 20 15  Temp: 98.5 F (36.9 C)   SpO2: 98% 99%    Physical Exam:  General:  Alert, cooperative, no distress, appears stated age  Head:  Normocephalic, without obvious abnormality, atraumatic  Eyes:  , EOM's intact,   Lungs:    No visible respiratory distress  Heart:  Regular rate and rhythm, S1, S2 normal  Abdomen:   Soft, non-tender, mild distended, bowel sounds present.  No peritoneal signs  Extremities:  Bilateral upper extremity bruised.  psych:  Somewhat anxious and upset.    Lab Results: Recent Labs    05/10/20 2310 05/11/20 0545 05/12/20 0200 05/13/20 0520  NA  --    < > 137 137  K  --    < > 3.7 3.5  CL  --    < > 109 108  CO2  --    < > 23 23  GLUCOSE  --    < > 191* 152*  BUN  --    < > 38* 34*  CREATININE  --    < > 1.75* 1.78*  CALCIUM  --    < > 7.1* 7.4*  MG 1.5*  --   --   --    < > = values in this interval not displayed.   Recent Labs    05/12/20 0200 05/13/20 0520  AST 16 18  ALT 12 11  ALKPHOS 33* 34*  BILITOT 0.7 0.7  PROT 3.4* 3.5*  ALBUMIN 2.1* 2.0*   Recent Labs    05/12/20 0200 05/13/20 0520  WBC 12.8* 10.5  NEUTROABS  --  8.2*  HGB 7.4* 6.2*  HCT 21.9* 18.9*  MCV 88.3 90.4  PLT 86* 93*   No results for input(s): LABPROT, INR in the last 72 hours.    Assessment/Plan: - Lower GI bleed.  EGD 4/26 negative for bleeding.  Colonoscopy 04/27  showed poor prep, coffee-ground/old blood  in the colon.  Multiple diverticulosis.  No evidence of active bleeding.  Follow-up bleeding scan positive for active bleeding probably in the ascending colon. CT angio 04/28 negative for active bleeding.  Had another colonoscopy on 05/01 without revealing active source of bleeding.  Follow-up CTA was positive and underwent IR guided embolization of right colic artery.  Was also diagnosed with pulmonary embolism and had IVC filter placed.  -Acute blood loss anemia.  Status post multiple units of blood transfusion -Pulmonary embolism.  S/p IVC filter placement  Recommendations ------------------------ -  Drop in hemoglobin noted but no overt bleeding.  Bruise over bilateral upper extremity noted, according to patient has been present for 2 days.  Denies abdominal pain, nausea and vomiting.  -Discussed with RN at bedside.  Discussed with hospitalist.  -Recommend repeating H&H this afternoon. -Change diet to full liquid  - If continues to drop hemoglobin, may need repeat CT scan to rule out complication from embolization/retroperitoneal bleed as well as  surgical  evaluation. Interventional radiology team has been notified.   Kathi Der MD, FACP 05/13/2020, 9:05 AM  Contact #  [phone]

## 2020-05-14 DIAGNOSIS — K5791 Diverticulosis of intestine, part unspecified, without perforation or abscess with bleeding: Secondary | ICD-10-CM

## 2020-05-14 LAB — TYPE AND SCREEN
ABO/RH(D): A POS
Antibody Screen: NEGATIVE
Unit division: 0

## 2020-05-14 LAB — BPAM RBC
Blood Product Expiration Date: 202205202359
ISSUE DATE / TIME: 202205031019
Unit Type and Rh: 6200

## 2020-05-14 LAB — GLUCOSE, CAPILLARY
Glucose-Capillary: 124 mg/dL — ABNORMAL HIGH (ref 70–99)
Glucose-Capillary: 223 mg/dL — ABNORMAL HIGH (ref 70–99)
Glucose-Capillary: 158 mg/dL — ABNORMAL HIGH (ref 70–99)
Glucose-Capillary: 161 mg/dL — ABNORMAL HIGH (ref 70–99)

## 2020-05-14 LAB — CBC WITH DIFFERENTIAL/PLATELET
Abs Immature Granulocytes: 0.09 10*3/uL — ABNORMAL HIGH (ref 0.00–0.07)
Basophils Absolute: 0 10*3/uL (ref 0.0–0.1)
Basophils Relative: 0 %
Eosinophils Absolute: 0.3 10*3/uL (ref 0.0–0.5)
Eosinophils Relative: 3 %
HCT: 22.3 % — ABNORMAL LOW (ref 39.0–52.0)
Hemoglobin: 7.4 g/dL — ABNORMAL LOW (ref 13.0–17.0)
Immature Granulocytes: 1 %
Lymphocytes Relative: 7 %
Lymphs Abs: 0.7 10*3/uL (ref 0.7–4.0)
MCH: 29.4 pg (ref 26.0–34.0)
MCHC: 33.2 g/dL (ref 30.0–36.0)
MCV: 88.5 fL (ref 80.0–100.0)
Monocytes Absolute: 0.8 10*3/uL (ref 0.1–1.0)
Monocytes Relative: 8 %
Neutro Abs: 8.4 10*3/uL — ABNORMAL HIGH (ref 1.7–7.7)
Neutrophils Relative %: 81 %
Platelets: 109 10*3/uL — ABNORMAL LOW (ref 150–400)
RBC: 2.52 MIL/uL — ABNORMAL LOW (ref 4.22–5.81)
RDW: 17.4 % — ABNORMAL HIGH (ref 11.5–15.5)
WBC: 10.3 10*3/uL (ref 4.0–10.5)
nRBC: 0.7 % — ABNORMAL HIGH (ref 0.0–0.2)

## 2020-05-14 LAB — COMPREHENSIVE METABOLIC PANEL
ALT: 17 U/L (ref 0–44)
AST: 43 U/L — ABNORMAL HIGH (ref 15–41)
Albumin: 2.1 g/dL — ABNORMAL LOW (ref 3.5–5.0)
Alkaline Phosphatase: 46 U/L (ref 38–126)
Anion gap: 2 — ABNORMAL LOW (ref 5–15)
BUN: 24 mg/dL — ABNORMAL HIGH (ref 8–23)
CO2: 25 mmol/L (ref 22–32)
Calcium: 7.4 mg/dL — ABNORMAL LOW (ref 8.9–10.3)
Chloride: 110 mmol/L (ref 98–111)
Creatinine, Ser: 1.4 mg/dL — ABNORMAL HIGH (ref 0.61–1.24)
GFR, Estimated: 50 mL/min — ABNORMAL LOW (ref >60)
Glucose, Bld: 139 mg/dL — ABNORMAL HIGH (ref 70–99)
Potassium: 3.3 mmol/L — ABNORMAL LOW (ref 3.5–5.1)
Sodium: 137 mmol/L (ref 135–145)
Total Bilirubin: 0.7 mg/dL (ref 0.3–1.2)
Total Protein: 3.8 g/dL — ABNORMAL LOW (ref 6.5–8.1)

## 2020-05-14 MED ORDER — POLYETHYLENE GLYCOL 3350 17 G PO PACK
17.0000 g | PACK | Freq: Two times a day (BID) | ORAL | Status: DC
  Administered 2020-05-14 – 2020-05-15 (×3): 17 g via ORAL
  Filled 2020-05-14 (×3): qty 1

## 2020-05-14 MED ORDER — POTASSIUM CHLORIDE CRYS ER 20 MEQ PO TBCR
40.0000 meq | EXTENDED_RELEASE_TABLET | Freq: Once | ORAL | Status: AC
  Administered 2020-05-14: 40 meq via ORAL
  Filled 2020-05-14: qty 2

## 2020-05-14 MED ORDER — FERROUS SULFATE 325 (65 FE) MG PO TABS
325.0000 mg | ORAL_TABLET | Freq: Three times a day (TID) | ORAL | Status: DC
  Administered 2020-05-14 – 2020-05-16 (×7): 325 mg via ORAL
  Filled 2020-05-14 (×7): qty 1

## 2020-05-14 MED ORDER — ASCORBIC ACID 500 MG PO TABS
250.0000 mg | ORAL_TABLET | Freq: Three times a day (TID) | ORAL | Status: DC
  Administered 2020-05-14 – 2020-05-20 (×20): 250 mg via ORAL
  Filled 2020-05-14 (×23): qty 1

## 2020-05-14 MED ORDER — BOOST / RESOURCE BREEZE PO LIQD CUSTOM
1.0000 | Freq: Three times a day (TID) | ORAL | Status: DC
  Administered 2020-05-14 – 2020-05-20 (×15): 1 via ORAL

## 2020-05-14 NOTE — Progress Notes (Signed)
  PCCM Interval Note   Chart reviewed.  Patient remains hemodynamically stable, afebrile, labs reviewed (improving AKI, H/H trend stable, thrombocytopenia slowly improving, improving leukocytosis).    Nothing further to add.  PCCM will sign off.  Please call us back if we can be of any further assistance.  Thank you.       Posey Boyer, ACNP [facility] 05/14/2020, 7:39 AM

## 2020-05-14 NOTE — Plan of Care (Signed)
  Problem: Education: Goal: Knowledge of General Education information will improve Description Including pain rating scale, medication(s)/side effects and non-pharmacologic comfort measures Outcome: Progressing   Problem: Health Behavior/Discharge Planning: Goal: Ability to manage health-related needs will improve Outcome: Progressing   Problem: Clinical Measurements: Goal: Ability to maintain clinical measurements within normal limits will improve Outcome: Progressing Goal: Will remain free from infection Outcome: Progressing Goal: Diagnostic test results will improve Outcome: Progressing Goal: Respiratory complications will improve Outcome: Progressing Goal: Cardiovascular complication will be avoided Outcome: Progressing   Problem: Activity: Goal: Risk for activity intolerance will decrease Outcome: Progressing   Problem: Nutrition: Goal: Adequate nutrition will be maintained Outcome: Progressing   Problem: Coping: Goal: Level of anxiety will decrease Outcome: Progressing   Problem: Elimination: Goal: Will not experience complications related to bowel motility Outcome: Progressing Goal: Will not experience complications related to urinary retention Outcome: Progressing   Problem: Pain Managment: Goal: General experience of comfort will improve Outcome: Progressing   Problem: Safety: Goal: Ability to remain free from injury will improve Outcome: Progressing   Problem: Skin Integrity: Goal: Risk for impaired skin integrity will decrease Outcome: Progressing   Problem: Education: Goal: Ability to identify signs and symptoms of gastrointestinal bleeding will improve Outcome: Progressing   Problem: Fluid Volume: Goal: Will show no signs and symptoms of excessive bleeding Outcome: Progressing   Problem: Clinical Measurements: Goal: Complications related to the disease process, condition or treatment will be avoided or minimized Outcome: Progressing   

## 2020-05-14 NOTE — Plan of Care (Signed)

## 2020-05-14 NOTE — Progress Notes (Signed)
Physical Therapy Treatment Patient Details Name: Thomas Joseph MRN: [id number] DOB: 04/09/1933 Today's Date: 05/14/2020    History of Present Illness Pt is an 87 y/o male presenting with new onset of melena.  Pt s/p upper GI 4/26 with no source found.  Colonoscopy 4/27 unrevealing do to poor prep. Bleeding scan showed bleeding in ascending colon. Pt also found to have DVT and PE. On 5/1 pt underwent coil embolization of right colic artery branch and IVC filter placement. PMHx:  CKD, DM, HTN, L TKA    PT Comments    Pt received in supine, agreeable to therapy session and with good participation and tolerance for gait and stair training. Increased time needed to initiate and perform mobility tasks and pt returned to supine x2 for BP assessment (needing BP on legs due to arm injuries and BP readings at times inaccurate). Pt denies dizziness with mobility tasks and needs up to minA for gait and stair trial in room. Plan to progress gait distance with chair follow next session. Continue to recommend OPPT.  Follow Up Recommendations  Outpatient PT     Equipment Recommendations  Rolling walker with 5" wheels    Recommendations for Other Services       Precautions / Restrictions Precautions Precautions: Fall Restrictions Weight Bearing Restrictions: No    Mobility  Bed Mobility Overal bed mobility: Needs Assistance Bed Mobility: Supine to Sit;Sit to Supine     Supine to sit: Min guard Sit to supine: Min guard   General bed mobility comments: from flat bed, needs to use hand rails for stability, x2 reps from bed during orthostatic vitals assessment    Transfers Overall transfer level: Needs assistance Equipment used: Rolling walker (2 wheeled) Transfers: Sit to/from Stand Sit to Stand: Min assist         General transfer comment: Assist to bring hips up  Ambulation/Gait Ambulation/Gait assistance: Min assist Gait Distance (Feet): 15 Feet (66ft, seated break, then  2ft) Assistive device: Rolling walker (2 wheeled) Gait Pattern/deviations: Step-through pattern;Decreased step length - left;Decreased step length - right;Shuffle;Trunk flexed;Decreased stride length;Decreased dorsiflexion - right;Decreased dorsiflexion - left;Staggering right Gait velocity: <0.1 m/s   General Gait Details: Assist for balance. Pt with very short, tentative steps; when cued for larger steps with improved foot clearance, pt with mild LOB to R side needing minA to correct, min guard otherwise   Stairs Stairs: Yes Stairs assistance: Min assist Stair Management: With walker;Forwards;Step to pattern Number of Stairs: 5 General stair comments: cues for sequencing, pt preferring leading with RLE to ascend and LLE to descend   Wheelchair Mobility    Modified Rankin (Stroke Patients Only)       Balance Overall balance assessment: Needs assistance Sitting-balance support: No upper extremity supported;Feet supported Sitting balance-Leahy Scale: Fair     Standing balance support: Bilateral upper extremity supported;During functional activity Standing balance-Leahy Scale: Poor Standing balance comment: walker and min guard for static standing up to minA with turning due to LOB                            Cognition Arousal/Alertness: Awake/alert Behavior During Therapy: WFL for tasks assessed/performed Overall Cognitive Status: Within Functional Limits for tasks assessed                                 General Comments: participatory, slow to process cues at times (  more when fatigued)      Exercises      General Comments General comments (skin integrity, edema, etc.): HR to 96 bpm with exertion, SpO2 99% on RA; BP 136/70 (91) resting prior to exertion and BP 172/49 (80) post-exertion      Pertinent Vitals/Pain Pain Assessment: Faces Faces Pain Scale: Hurts a little bit Pain Location: "tailbone" soreness from being in bed Pain  Descriptors / Indicators: Discomfort Pain Intervention(s): Monitored during session;Repositioned (reviewed pressure offloading strategy)    Home Living                      Prior Function            PT Goals (current goals can now be found in the care plan section) Acute Rehab PT Goals Patient Stated Goal: return to independence PT Goal Formulation: With patient Time For Goal Achievement: 05/14/20 Potential to Achieve Goals: Good Progress towards PT goals: Progressing toward goals    Frequency    Min 3X/week      PT Plan Current plan remains appropriate    Co-evaluation              AM-PAC PT "6 Clicks" Mobility   Outcome Measure  Help needed turning from your back to your side while in a flat bed without using bedrails?: None Help needed moving from lying on your back to sitting on the side of a flat bed without using bedrails?: A Little Help needed moving to and from a bed to a chair (including a wheelchair)?: A Little Help needed standing up from a chair using your arms (e.g., wheelchair or bedside chair)?: A Little Help needed to walk in hospital room?: A Little Help needed climbing 3-5 steps with a railing? : A Little 6 Click Score: 19    End of Session Equipment Utilized During Treatment: Gait belt Activity Tolerance: Patient tolerated treatment well Patient left: with call bell/phone within reach;with family/visitor present;in chair;Other (comment) (spouse in room, sitting up in chair to eat.) Nurse Communication: Mobility status PT Visit Diagnosis: Other abnormalities of gait and mobility (R26.89);Difficulty in walking, not elsewhere classified (R26.2)     Time: 8527-7824 PT Time Calculation (min) (ACUTE ONLY): 45 min  Charges:  $Gait Training: 8-22 mins $Therapeutic Activity: 23-37 mins                     Hasani Diemer P., PTA Acute Rehabilitation Services Pager: [phone] Office: [phone]   [place] 05/14/2020, 4:58 PM

## 2020-05-14 NOTE — Progress Notes (Signed)
PROGRESS NOTE   Thomas Joseph  ONG:[id number] DOB: [birth year] DOA: 05/05/2020 PCP: [name], Sharon, MD  Brief Narrative:   History of Present Illness:  87 year old male with past medical history as below, which is significant for chronic kidney disease, diabetes mellitus, hypertension, and anemia.  [facility] after new onset melena on 4/25 and hemoglobin dropped 12.7 and 9.6.  Transfuse 1 unit PRBC at that time.  Gastroenterology was consulted at which point patient underwent EGD with no obvious source of bleeding.  He underwent colonoscopy on 4/27 which was concerning for lower GI bleed.  He had a bleeding scan and IR was consulted demonstrating bleeding in the ascending colon.  He underwent mesenteric angiography and coiling on 5/1.  Over the course of his hospitalization he received 12 units of PRBC.  Course then complicated on 5/3 by hemoglobin drop to 6.2 requiring additional transfusion He has dropped his hemoglobin again 5/3 has been transfused 1 more unit and general surgery has been consulted with regards to elective hemicolectomy to me versus total abdominal colectomy and end ileostomy  Hospital-Problem based course  Severe anemia due to Lower GI bleeding ascending colon  -s/p Embolization right colic artery 5/1 Dr. Lowella DandyHenn -s/p 12 U blood transfusions Colonoscopy 5/1 = blood in colon Recurrent bleeding 5/3 General surgery consulted:  Hemicolectomy versus total colectomy plus and ileostomy if continues to bleed  Paroxysmal A. fib Occurred in the setting of large-volume GI bleed-keep on monitors Is predominantly in sinus rhythm at this time -no blood thinner  Compensated HFpEF-EF 70 to 75% this admission severe left ventricular concentric hypertrophy  ?  Lower extremity DVT-small pulmonary embolism on imaging Not a candidate for anticoagulation given massive bleed-subrenal artery IVC filter placed 5/1 Lower extremity duplex + DVT 5/1  Hematoma left  arm at midline site Extravasation as well as hematoma on left arm--midline removed  Leukocytosis secondary to stress of GI bleed Etiology unclear--has been on steroids prior to admission-no further work-up  DM TY 2 Continue sliding scale alone  Holding metformin 1000 daily  AKi on admit  -resolved   DVT prophylaxis:  SCD  code Status: Full Family Communication: Discussed with patient's wife  Disposition:  Status is: Inpatient  Remains inpatient appropriate because:Ongoing diagnostic testing needed not appropriate for outpatient work up and Unsafe d/c plan  Dispo: The patient is from: Home              Anticipated d/c is to: Home              Patient currently is not medically stable to d/c.   Difficult to place patient No    Consultants:   Gastroenterology  Interventional radiology  Procedures:  Upper endoscopy 4/26 Colonoscopy 4/27 Bleeding scan 4/27 Multiphase CT 4/28 showing no bleeding Mesenteric arteriogram/IVC filter 5/1 1) IVC filter placed below renal veins 2) Active bleeding from right colic artery branch.  Successful coil embolization of feeding branch to bleeding site. 3) Right groin closure with Angioseal device.  Lower extremity duplex RIGHT:  - Findings consistent with age indeterminate deep vein thrombosis  involving the right peroneal veins.     LEFT:  - Findings consistent with age indeterminate deep vein thrombosis  involving the left posterior tibial veins, and left peroneal veins.      Echocardiogram 5/1 =  1. Left ventricular ejection fraction, by estimation, is 70 to 75%. The  left ventricle has hyperdynamic function. The left ventricle has no  regional wall  motion abnormalities. There is severe concentric left  ventricular hypertrophy. Left ventricular  diastolic parameters are consistent with Grade I diastolic dysfunction  (impaired relaxation).  2. Right ventricular systolic function is normal. The right ventricular  size is  normal. There is moderately elevated pulmonary artery systolic  pressure. The estimated right ventricular systolic pressure is 47.4 mmHg.  3. The pericardial effusion is circumferential. There is no evidence of  cardiac tamponade.  4. The mitral valve is normal in structure. No evidence of mitral valve  regurgitation. No evidence of mitral stenosis.  5. Tricuspid valve regurgitation is mild to moderate.  6. The aortic valve is tricuspid. There is mild calcification of the  aortic valve. There is mild thickening of the aortic valve. Aortic valve  regurgitation is not visualized. Mild aortic valve sclerosis is present,  with no evidence of aortic valve  stenosis.  7. The inferior vena cava is normal in size with greater than 50%  respiratory variability, suggesting right atrial pressure of 3 mmHg.   Antimicrobials: None   Subjective:  Feels better today    Objective: Vitals:   05/14/20 0300 05/14/20 0414 05/14/20 0735 05/14/20 1134  BP:  (!) 142/53  (!) 150/56  Pulse: 68 70  70  Resp: 19 18  20   Temp:  98 F (36.7 C) 98.2 F (36.8 C) 98.1 F (36.7 C)  TempSrc:  Oral Oral Oral  SpO2: 100% 100%  99%  Weight:      Height:        Intake/Output Summary (Last 24 hours) at 05/14/2020 1307 Last data filed at 05/14/2020 1100 Gross per 24 hour  Intake 1095 ml  Output 2525 ml  Net -1430 ml   Filed Weights   05/05/20 1022 05/07/20 0831 05/11/20 0820  Weight: 102 kg 102 kg 102 kg    Examination:   General: Appearance:     Overweight male , ill appearing     Lungs:     respirations unlabored  Heart:    Normal heart rate.   MS:   All extremities are intact. + edema- bruising on left upper ext  Neurologic:   Awake, alert, oriented x 3. No apparent focal neurological           defect.      Data Reviewed: personally reviewed   CBC    Component Value Date/Time   WBC 10.3 05/14/2020 0252   RBC 2.52 (L) 05/14/2020 0252   HGB 7.4 (L) 05/14/2020 0252   HCT 22.3 (L)  05/14/2020 0252   PLT 109 (L) 05/14/2020 0252   MCV 88.5 05/14/2020 0252   MCH 29.4 05/14/2020 0252   MCHC 33.2 05/14/2020 0252   RDW 17.4 (H) 05/14/2020 0252   LYMPHSABS 0.7 05/14/2020 0252   MONOABS 0.8 05/14/2020 0252   EOSABS 0.3 05/14/2020 0252   BASOSABS 0.0 05/14/2020 0252   CMP Latest Ref Rng & Units 05/14/2020 05/13/2020 05/12/2020  Glucose 70 - 99 mg/dL 07/12/2020) 222(L) 798(X)  BUN 8 - 23 mg/dL 211(H) 41(D) 40(C)  Creatinine 0.61 - 1.24 mg/dL 14(G) 8.18(H) 6.31(S)  Sodium 135 - 145 mmol/L 137 137 137  Potassium 3.5 - 5.1 mmol/L 3.3(L) 3.5 3.7  Chloride 98 - 111 mmol/L 110 108 109  CO2 22 - 32 mmol/L 25 23 23   Calcium 8.9 - 10.3 mg/dL 7.4(L) 7.4(L) 7.1(L)  Total Protein 6.5 - 8.1 g/dL 9.70(Y) ) 6.3(Z)  Total Bilirubin 0.3 - 1.2 mg/dL 0.7 0.7 0.7  Alkaline Phos 38 - 126 U/L  46 34(L) 33(L)  AST 15 - 41 U/L 43(H) 18 16  ALT 0 - 44 U/L 17 11 12      Radiology Studies: CT Angio Abd/Pel w/ and/or w/o  Result Date: 05/13/2020 CLINICAL DATA:  Status post successful transcatheter coil embolization of a right colonic diverticular GI bleed on 05/11/2020. Drop in hemoglobin overnight. EXAM: CT ANGIOGRAPHY ABDOMEN AND PELVIS WITH CONTRAST AND WITHOUT CONTRAST TECHNIQUE: Multidetector CT imaging of the abdomen and pelvis was performed using the standard protocol during bolus administration of intravenous contrast. Multiplanar reconstructed images and MIPs were obtained and reviewed to evaluate the vascular anatomy. CONTRAST:  37mL OMNIPAQUE IOHEXOL 350 MG/ML SOLN COMPARISON:  Prior CTA of the abdomen and pelvis and arteriographic procedure on 05/11/2020 FINDINGS: VASCULAR Aorta: Normal abdominal aorta without evidence atherosclerosis, aneurysm or dissection. Celiac: Normally patent. Normally patent branch vessels and branch vessel anatomy. SMA: Normally patent. Renals: Bilateral paired renal artery supply is stable and demonstrates normal patency. IMA: Normally patent. Inflow: Normally patent  bilateral iliac arteries without aneurysm or significant obstructive disease. Proximal Outflow: Normally patent bilateral common femoral arteries and femoral bifurcations. No evidence of pseudoaneurysm after recent arteriographic procedure from right femoral access. Veins: Venous phase imaging demonstrates no venous abnormalities. Stable appearance of indwelling infrarenal IVC filter. Review of the MIP images confirms the above findings. NON-VASCULAR Lower chest: Small hiatal hernia. Hepatobiliary: Stable scattered small hepatic cysts. The gallbladder is unremarkable. No biliary ductal dilatation. Pancreas: Unremarkable. No pancreatic ductal dilatation or surrounding inflammatory changes. Spleen: Normal in size without focal abnormality. Adrenals/Urinary Tract: Stable renal cysts bilaterally including large upper pole right renal cyst. No hydronephrosis. The bladder contains high density urine related to excretion of contrast. Stomach/Bowel: On the current study, there is some higher density material present in the colon prior to contrast administration which limits evaluation for GI bleed. This material does not change in appearance on the arterial and venous phases of imaging after contrast administration and obvious active gastrointestinal bleeding is not identified by CTA. Coils are present in distal right colic branches. No evidence of bowel obstruction, inflammation or free intraperitoneal air. Normal appendix. Lymphatic: No enlarged abdominal or pelvic lymph nodes. Reproductive: Prostate is unremarkable. Other: No retroperitoneal, intraperitoneal or groin hemorrhage. No hernias, ascites or focal fluid collections. Musculoskeletal: No acute or significant osseous findings. IMPRESSION: 1. No hemorrhagic complication evident after recent arteriography and embolization of a right colonic diverticular arterial bleed. 2. Although there is some higher density material in the colon on the current study which limits  evaluation for intraluminal bleeding, there is no visible change in appearance of intraluminal material on arterial and venous phases of imaging to suggest obvious active gastrointestinal bleeding currently. Electronically Signed   By: 07/11/2020 M.D.   On: 05/13/2020 13:18     Scheduled Meds: . Chlorhexidine Gluconate Cloth  6 each Topical Daily  . feeding supplement  1 Container Oral TID BM  . ferrous sulfate  325 mg Oral TID WC  . insulin aspart  0-9 Units Subcutaneous TID WC  . latanoprost  1 drop Both Eyes QHS  . pantoprazole  40 mg Intravenous Q12H  . polyethylene glycol  17 g Oral BID  . vitamin C  250 mg Oral TID   Continuous Infusions: . sodium chloride    . lactated ringers 20 mL/hr at 05/11/20 0845     LOS: 9 days   Time spent: 20  21, DO [facility] To contact the attending provider between 7A-7P or  the covering provider during after hours 7P-7A, please log into the web site [email or web address] and access using universal [place] password for that web site. If you do not have the password, please call the hospital operator.  05/14/2020, 1:07 PM

## 2020-05-14 NOTE — Progress Notes (Signed)
[facility] Gastroenterology Progress Note  Thomas Joseph 87 y.o. 1933-12-22  CC: GI bleed   Subjective: Patient seen and examined at bedside.  Resting comfortably in the bed.  Family at bedside.  No bowel movement since last 2 days.  Complaining of left lower quadrant discomfort.  Denies nausea or vomiting.  ROS : Afebrile, negative for chest pain.   Objective: Vital signs in last 24 hours: Vitals:   05/14/20 0414 05/14/20 0735  BP: (!) 142/53   Pulse: 70   Resp: 18   Temp: 98 F (36.7 C) 98.2 F (36.8 C)  SpO2: 100%     Physical Exam:  General:  Alert, cooperative, no distress, appears stated age  Head:  Normocephalic, without obvious abnormality, atraumatic  Eyes:  , EOM's intact,   Lungs:    No visible respiratory distress  Heart:  Regular rate and rhythm, S1, S2 normal  Abdomen:   Soft, non-tender, mild distended, bowel sounds present.  No peritoneal signs  Extremities:  Bilateral upper extremity bruised.  psych:  Mood and affect normal    Lab Results: Recent Labs    05/13/20 0520 05/14/20 0252  NA 137 137  K 3.5 3.3*  CL 108 110  CO2 23 25  GLUCOSE 152* 139*  BUN 34* 24*  CREATININE 1.78* 1.40*  CALCIUM 7.4* 7.4*   Recent Labs    05/13/20 0520 05/14/20 0252  AST 18 43*  ALT 11 17  ALKPHOS 34* 46  BILITOT 0.7 0.7  PROT 3.5* 3.8*  ALBUMIN 2.0* 2.1*   Recent Labs    05/13/20 0520 05/13/20 1600 05/13/20 2200 05/14/20 0252  WBC 10.5  --  10.7* 10.3  NEUTROABS 8.2*  --   --  8.4*  HGB 6.2*   < > 7.7* 7.4*  HCT 18.9*   < > 22.8* 22.3*  MCV 90.4  --  88.7 88.5  PLT 93*  --  106* 109*   < > = values in this interval not displayed.   No results for input(s): LABPROT, INR in the last 72 hours.    Assessment/Plan: - Lower GI bleed.  EGD 4/26 negative for bleeding.  Colonoscopy 04/27  showed poor prep, coffee-ground/old blood in the colon.  Multiple diverticulosis.  No evidence of active bleeding.  Follow-up bleeding scan positive for active  bleeding probably in the ascending colon. CT angio 04/28 negative for active bleeding.  Had another colonoscopy on 05/01 without revealing active source of bleeding.  Follow-up CTA was positive and underwent IR guided embolization of right colic artery.  Was also diagnosed with pulmonary embolism and had IVC filter placed.  -Acute blood loss anemia.  Status post multiple units of blood transfusion -Pulmonary embolism.  S/p IVC filter placement -Constipation.  No bowel movement for last 2 days  Recommendations ------------------------ -Repeat CTA negative yesterday for active bleeding. -Appreciate surgery input. -Start MiraLAX for constipation -Advance diet to full liquid.  Slowly advance diet to soft if no further drop in hemoglobin. -Monitor H&H.  Transfuse if hemoglobin less than 7 -No further inpatient GI work-up planned.  GI will sign off.  Call us back if needed  Long discussion with patient's wife at bedside regarding lower GI bleed.  Need for  surgical intervention if ongoing drop in hemoglobin discussed.   Kathi Der MD, FACP 05/14/2020, 9:18 AM  Contact #  [phone]

## 2020-05-14 NOTE — Progress Notes (Signed)
3 Days Post-Op   Subjective/Chief Complaint: No bloody stools.  Passing gas.  Still with some distention.     Objective: Vital signs in last 24 hours: Temp:  [97.6 F (36.4 C)-98.2 F (36.8 C)] 98.1 F (36.7 C) (05/04 1134) Pulse Rate:  [68-78] 70 (05/04 1134) Resp:  [15-20] 20 (05/04 1134) BP: (142-172)/(46-59) 150/56 (05/04 1134) SpO2:  [96 %-100 %] 99 % (05/04 1134) Last BM Date: 05/11/20  Intake/Output from previous day: 05/03 0701 - 05/04 0700 In: 795 [P.O.:480; Blood:315] Out: 2825 [Urine:2825] Intake/Output this shift: Total I/O In: 300 [P.O.:300] Out: 700 [Urine:700]  General appearance: alert, cooperative and no distress Cardio: regular rate and rhythm GI: soft, distended, non tender Extremities: extremities normal, atraumatic, no cyanosis or edema  Lab Results:  Recent Labs    05/13/20 2200 05/14/20 0252  WBC 10.7* 10.3  HGB 7.7* 7.4*  HCT 22.8* 22.3*  PLT 106* 109*   BMET Recent Labs    05/13/20 0520 05/14/20 0252  NA 137 137  K 3.5 3.3*  CL 108 110  CO2 23 25  GLUCOSE 152* 139*  BUN 34* 24*  CREATININE 1.78* 1.40*  CALCIUM 7.4* 7.4*   PT/INR No results for input(s): LABPROT, INR in the last 72 hours. ABG No results for input(s): PHART, HCO3 in the last 72 hours.  Invalid input(s): PCO2, PO2  Studies/Results: CT Angio Abd/Pel w/ and/or w/o  Result Date: 05/13/2020 CLINICAL DATA:  Status post successful transcatheter coil embolization of a right colonic diverticular GI bleed on 05/11/2020. Drop in hemoglobin overnight. EXAM: CT ANGIOGRAPHY ABDOMEN AND PELVIS WITH CONTRAST AND WITHOUT CONTRAST TECHNIQUE: Multidetector CT imaging of the abdomen and pelvis was performed using the standard protocol during bolus administration of intravenous contrast. Multiplanar reconstructed images and MIPs were obtained and reviewed to evaluate the vascular anatomy. CONTRAST:  10mL OMNIPAQUE IOHEXOL 350 MG/ML SOLN COMPARISON:  Prior CTA of the abdomen and  pelvis and arteriographic procedure on 05/11/2020 FINDINGS: VASCULAR Aorta: Normal abdominal aorta without evidence atherosclerosis, aneurysm or dissection. Celiac: Normally patent. Normally patent branch vessels and branch vessel anatomy. SMA: Normally patent. Renals: Bilateral paired renal artery supply is stable and demonstrates normal patency. IMA: Normally patent. Inflow: Normally patent bilateral iliac arteries without aneurysm or significant obstructive disease. Proximal Outflow: Normally patent bilateral common femoral arteries and femoral bifurcations. No evidence of pseudoaneurysm after recent arteriographic procedure from right femoral access. Veins: Venous phase imaging demonstrates no venous abnormalities. Stable appearance of indwelling infrarenal IVC filter. Review of the MIP images confirms the above findings. NON-VASCULAR Lower chest: Small hiatal hernia. Hepatobiliary: Stable scattered small hepatic cysts. The gallbladder is unremarkable. No biliary ductal dilatation. Pancreas: Unremarkable. No pancreatic ductal dilatation or surrounding inflammatory changes. Spleen: Normal in size without focal abnormality. Adrenals/Urinary Tract: Stable renal cysts bilaterally including large upper pole right renal cyst. No hydronephrosis. The bladder contains high density urine related to excretion of contrast. Stomach/Bowel: On the current study, there is some higher density material present in the colon prior to contrast administration which limits evaluation for GI bleed. This material does not change in appearance on the arterial and venous phases of imaging after contrast administration and obvious active gastrointestinal bleeding is not identified by CTA. Coils are present in distal right colic branches. No evidence of bowel obstruction, inflammation or free intraperitoneal air. Normal appendix. Lymphatic: No enlarged abdominal or pelvic lymph nodes. Reproductive: Prostate is unremarkable. Other: No  retroperitoneal, intraperitoneal or groin hemorrhage. No hernias, ascites or focal fluid collections. Musculoskeletal:  No acute or significant osseous findings. IMPRESSION: 1. No hemorrhagic complication evident after recent arteriography and embolization of a right colonic diverticular arterial bleed. 2. Although there is some higher density material in the colon on the current study which limits evaluation for intraluminal bleeding, there is no visible change in appearance of intraluminal material on arterial and venous phases of imaging to suggest obvious active gastrointestinal bleeding currently. Electronically Signed   By: Irish Lack M.D.   On: 05/13/2020 13:18    Anti-infectives: Anti-infectives (From admission, onward)   None      Assessment/Plan: s/p Procedure(s): COLONOSCOPY WITH PROPOFOL (N/A) lower GI bleed.    Continue surveillance. Serial HCT. Hopefully the right colic embolization works in the long term  Agree with clears today.  Hopefully can advance tomorrow if no further bleeding.     LOS: 9 days    Almond Lint 05/14/2020

## 2020-05-15 DIAGNOSIS — N179 Acute kidney failure, unspecified: Secondary | ICD-10-CM | POA: Diagnosis not present

## 2020-05-15 DIAGNOSIS — K922 Gastrointestinal hemorrhage, unspecified: Secondary | ICD-10-CM | POA: Diagnosis not present

## 2020-05-15 LAB — COMPREHENSIVE METABOLIC PANEL
ALT: 17 U/L (ref 0–44)
AST: 35 U/L (ref 15–41)
Albumin: 2 g/dL — ABNORMAL LOW (ref 3.5–5.0)
Alkaline Phosphatase: 54 U/L (ref 38–126)
Anion gap: 3 — ABNORMAL LOW (ref 5–15)
BUN: 20 mg/dL (ref 8–23)
CO2: 25 mmol/L (ref 22–32)
Calcium: 7.5 mg/dL — ABNORMAL LOW (ref 8.9–10.3)
Chloride: 109 mmol/L (ref 98–111)
Creatinine, Ser: 1.36 mg/dL — ABNORMAL HIGH (ref 0.61–1.24)
GFR, Estimated: 51 mL/min — ABNORMAL LOW (ref >60)
Glucose, Bld: 125 mg/dL — ABNORMAL HIGH (ref 70–99)
Potassium: 3.5 mmol/L (ref 3.5–5.1)
Sodium: 137 mmol/L (ref 135–145)
Total Bilirubin: 0.6 mg/dL (ref 0.3–1.2)
Total Protein: 3.7 g/dL — ABNORMAL LOW (ref 6.5–8.1)

## 2020-05-15 LAB — CBC WITH DIFFERENTIAL/PLATELET
Abs Immature Granulocytes: 0.06 10*3/uL (ref 0.00–0.07)
Basophils Absolute: 0 10*3/uL (ref 0.0–0.1)
Basophils Relative: 0 %
Eosinophils Absolute: 0.3 10*3/uL (ref 0.0–0.5)
Eosinophils Relative: 3 %
HCT: 21.4 % — ABNORMAL LOW (ref 39.0–52.0)
Hemoglobin: 7 g/dL — ABNORMAL LOW (ref 13.0–17.0)
Immature Granulocytes: 1 %
Lymphocytes Relative: 9 %
Lymphs Abs: 0.8 10*3/uL (ref 0.7–4.0)
MCH: 29.7 pg (ref 26.0–34.0)
MCHC: 32.7 g/dL (ref 30.0–36.0)
MCV: 90.7 fL (ref 80.0–100.0)
Monocytes Absolute: 0.8 10*3/uL (ref 0.1–1.0)
Monocytes Relative: 9 %
Neutro Abs: 6.8 10*3/uL (ref 1.7–7.7)
Neutrophils Relative %: 78 %
Platelets: 131 10*3/uL — ABNORMAL LOW (ref 150–400)
RBC: 2.36 MIL/uL — ABNORMAL LOW (ref 4.22–5.81)
RDW: 17.5 % — ABNORMAL HIGH (ref 11.5–15.5)
WBC: 8.6 10*3/uL (ref 4.0–10.5)
nRBC: 0 % (ref 0.0–0.2)

## 2020-05-15 LAB — HEMOGLOBIN AND HEMATOCRIT, BLOOD
HCT: 24.8 % — ABNORMAL LOW (ref 39.0–52.0)
Hemoglobin: 7.9 g/dL — ABNORMAL LOW (ref 13.0–17.0)

## 2020-05-15 LAB — MAGNESIUM: Magnesium: 1.6 mg/dL — ABNORMAL LOW (ref 1.7–2.4)

## 2020-05-15 LAB — GLUCOSE, CAPILLARY
Glucose-Capillary: 115 mg/dL — ABNORMAL HIGH (ref 70–99)
Glucose-Capillary: 199 mg/dL — ABNORMAL HIGH (ref 70–99)
Glucose-Capillary: 183 mg/dL — ABNORMAL HIGH (ref 70–99)
Glucose-Capillary: 192 mg/dL — ABNORMAL HIGH (ref 70–99)

## 2020-05-15 MED ORDER — FUROSEMIDE 10 MG/ML IJ SOLN
20.0000 mg | Freq: Once | INTRAMUSCULAR | Status: AC
  Administered 2020-05-15: 20 mg via INTRAVENOUS
  Filled 2020-05-15: qty 2

## 2020-05-15 MED ORDER — POTASSIUM CHLORIDE CRYS ER 20 MEQ PO TBCR
40.0000 meq | EXTENDED_RELEASE_TABLET | Freq: Once | ORAL | Status: AC
  Administered 2020-05-15: 40 meq via ORAL
  Filled 2020-05-15: qty 2

## 2020-05-15 MED ORDER — MAGNESIUM SULFATE 2 GM/50ML IV SOLN
2.0000 g | Freq: Once | INTRAVENOUS | Status: AC
  Administered 2020-05-15: 2 g via INTRAVENOUS
  Filled 2020-05-15: qty 50

## 2020-05-15 MED ORDER — PANTOPRAZOLE SODIUM 40 MG PO TBEC
40.0000 mg | DELAYED_RELEASE_TABLET | Freq: Two times a day (BID) | ORAL | Status: DC
  Administered 2020-05-15 – 2020-05-20 (×10): 40 mg via ORAL
  Filled 2020-05-15 (×10): qty 1

## 2020-05-15 MED ORDER — POLYETHYLENE GLYCOL 3350 17 G PO PACK
17.0000 g | PACK | Freq: Three times a day (TID) | ORAL | Status: DC
  Administered 2020-05-15 – 2020-05-20 (×14): 17 g via ORAL
  Filled 2020-05-15 (×15): qty 1

## 2020-05-15 NOTE — Progress Notes (Addendum)
PROGRESS NOTE   Thomas Joseph  ZOX:[id number] DOB: 10-02-1933 DOA: 05/05/2020 PCP: [name], Sharon, MD  Brief Narrative:   History of Present Illness:  87 year old male with past medical history as below, which is significant for chronic kidney disease, diabetes mellitus, hypertension, and anemia.  [facility] after new onset melena on 4/25 and hemoglobin dropped 12.7 and 9.6.  Transfuse 1 unit PRBC at that time.  Gastroenterology was consulted at which point patient underwent EGD with no obvious source of bleeding.  He underwent colonoscopy on 4/27 which was concerning for lower GI bleed.  He had a bleeding scan and IR was consulted demonstrating bleeding in the ascending colon.  He underwent mesenteric angiography and coiling on 5/1.  Over the course of his hospitalization he received 12 units of PRBC.  Course then complicated on 5/3 by hemoglobin drop to 6.2 requiring additional transfusion He has dropped his hemoglobin again 5/3 has been transfused 1 more unit and general surgery has been consulted with regards to elective hemicolectomy to me versus total abdominal colectomy and end ileostomy.  No further bleeding seen thus far  Hospital-Problem based course  Severe anemia due to Lower GI bleeding in ascending colon  -s/p Embolization right colic artery 5/1 Dr. Lowella DandyHenn -s/p 12 U blood transfusions Colonoscopy 5/1 showed blood in colon Recurrent bleeding 5/3 General surgery consulted:  Hemicolectomy versus total colectomy plus and ileostomy if continues to bleed -hgb 7 today-- will give IV lasix x 1-- check output and then may need repeat h/h prior to PRBC  Paroxysmal A. fib Occurred in the setting of large-volume GI bleed-keep on monitors Is predominantly in sinus rhythm at this time -no blood thinner  Compensated HFpEF-EF 70 to 75% this admission severe left ventricular concentric hypertrophy  ?  Lower extremity DVT-small pulmonary embolism on imaging Not  a candidate for anticoagulation given massive bleed-subrenal artery IVC filter placed 5/1 Lower extremity duplex + DVT 5/1  Hematoma left arm at midline site Extravasation as well as hematoma on left arm--midline removed  Leukocytosis secondary to stress of GI bleed Etiology unclear--has been on steroids prior to admission-no further work-up  Hypomagnesemia  -recheck, not sure was actually repleted on 4/30  DM TY 2 Continue sliding scale alone  Holding metformin 1000 daily  AKi on admit  -trending down, avoid hypotension   DVT prophylaxis:  SCD  code Status: Full Family Communication: Discussed with patient's wife 5/4 Disposition:  Status is: Inpatient  Remains inpatient appropriate because:Ongoing diagnostic testing needed not appropriate for outpatient work up and Unsafe d/c plan  Dispo: The patient is from: Home              Anticipated d/c is to: Home              Patient currently is not medically stable to d/c.   Difficult to place patient No    Consultants:   Gastroenterology  Interventional radiology  Procedures:  Upper endoscopy 4/26 Colonoscopy 4/27 Bleeding scan 4/27 Multiphase CT 4/28 showing no bleeding Mesenteric arteriogram/IVC filter 5/1 1) IVC filter placed below renal veins 2) Active bleeding from right colic artery branch.  Successful coil embolization of feeding branch to bleeding site. 3) Right groin closure with Angioseal device.  Lower extremity duplex RIGHT:  - Findings consistent with age indeterminate deep vein thrombosis  involving the right peroneal veins.     LEFT:  - Findings consistent with age indeterminate deep vein thrombosis  involving the left  posterior tibial veins, and left peroneal veins.      Echocardiogram 5/1 =  1. Left ventricular ejection fraction, by estimation, is 70 to 75%. The  left ventricle has hyperdynamic function. The left ventricle has no  regional wall motion abnormalities. There is severe  concentric left  ventricular hypertrophy. Left ventricular  diastolic parameters are consistent with Grade I diastolic dysfunction  (impaired relaxation).  2. Right ventricular systolic function is normal. The right ventricular  size is normal. There is moderately elevated pulmonary artery systolic  pressure. The estimated right ventricular systolic pressure is 47.4 mmHg.  3. The pericardial effusion is circumferential. There is no evidence of  cardiac tamponade.  4. The mitral valve is normal in structure. No evidence of mitral valve  regurgitation. No evidence of mitral stenosis.  5. Tricuspid valve regurgitation is mild to moderate.  6. The aortic valve is tricuspid. There is mild calcification of the  aortic valve. There is mild thickening of the aortic valve. Aortic valve  regurgitation is not visualized. Mild aortic valve sclerosis is present,  with no evidence of aortic valve  stenosis.  7. The inferior vena cava is normal in size with greater than 50%  respiratory variability, suggesting right atrial pressure of 3 mmHg.   Antimicrobials: None   Subjective:  Feels less distended, no BM yet    Objective: Vitals:   05/15/20 0000 05/15/20 0200 05/15/20 0300 05/15/20 0713  BP: (!) 185/50 (!) 167/50 (!) 178/48 (!) 178/60  Pulse:   66 67  Resp:   (!) 23 (!) 22  Temp:   97.8 F (36.6 C) 97.9 F (36.6 C)  TempSrc:   Oral Oral  SpO2:   100% 99%  Weight:      Height:        Intake/Output Summary (Last 24 hours) at 05/15/2020 0953 Last data filed at 05/15/2020 0500 Gross per 24 hour  Intake 770 ml  Output 2100 ml  Net -1330 ml   Filed Weights   05/05/20 1022 05/07/20 0831 05/11/20 0820  Weight: 102 kg 102 kg 102 kg    Examination:   General: Appearance:     Overweight male in no acute distress     Lungs:     Clear to auscultation bilaterally, respirations unlabored  Heart:    Normal heart rate.    MS:   All extremities are intact.  Bruising on left upper  arm  Neurologic:   Awake, alert, oriented x 3. No apparent focal neurological           defect.       Data Reviewed: personally reviewed   CBC    Component Value Date/Time   WBC 8.6 05/15/2020 0340   RBC 2.36 (L) 05/15/2020 0340   HGB 7.0 (L) 05/15/2020 0340   HCT 21.4 (L) 05/15/2020 0340   PLT 131 (L) 05/15/2020 0340   MCV 90.7 05/15/2020 0340   MCH 29.7 05/15/2020 0340   MCHC 32.7 05/15/2020 0340   RDW 17.5 (H) 05/15/2020 0340   LYMPHSABS 0.8 05/15/2020 0340   MONOABS 0.8 05/15/2020 0340   EOSABS 0.3 05/15/2020 0340   BASOSABS 0.0 05/15/2020 0340   CMP Latest Ref Rng & Units 05/15/2020 05/14/2020 05/13/2020  Glucose 70 - 99 mg/dL 196(Q) 229(N) 989(Q)  BUN 8 - 23 mg/dL 20 11(H) 41(D)  Creatinine 0.61 - 1.24 mg/dL 4.08(X) 4.48(J) 8.56(D)  Sodium 135 - 145 mmol/L 137 137 137  Potassium 3.5 - 5.1 mmol/L 3.5 3.3(L) 3.5  Chloride  98 - 111 mmol/L 109 110 108  CO2 22 - 32 mmol/L 25 25 23   Calcium 8.9 - 10.3 mg/dL 7.5(L) 7.4(L) 7.4(L)  Total Protein 6.5 - 8.1 g/dL ) 9.6(G) 8.3(M)  Total Bilirubin 0.3 - 1.2 mg/dL 0.6 0.7 0.7  Alkaline Phos 38 - 126 U/L 54 46 34(L)  AST 15 - 41 U/L 35 43(H) 18  ALT 0 - 44 U/L 17 17 11      Radiology Studies: CT Angio Abd/Pel w/ and/or w/o  Result Date: 05/13/2020 CLINICAL DATA:  Status post successful transcatheter coil embolization of a right colonic diverticular GI bleed on 05/11/2020. Drop in hemoglobin overnight. EXAM: CT ANGIOGRAPHY ABDOMEN AND PELVIS WITH CONTRAST AND WITHOUT CONTRAST TECHNIQUE: Multidetector CT imaging of the abdomen and pelvis was performed using the standard protocol during bolus administration of intravenous contrast. Multiplanar reconstructed images and MIPs were obtained and reviewed to evaluate the vascular anatomy. CONTRAST:  82mL OMNIPAQUE IOHEXOL 350 MG/ML SOLN COMPARISON:  Prior CTA of the abdomen and pelvis and arteriographic procedure on 05/11/2020 FINDINGS: VASCULAR Aorta: Normal abdominal aorta without evidence  atherosclerosis, aneurysm or dissection. Celiac: Normally patent. Normally patent branch vessels and branch vessel anatomy. SMA: Normally patent. Renals: Bilateral paired renal artery supply is stable and demonstrates normal patency. IMA: Normally patent. Inflow: Normally patent bilateral iliac arteries without aneurysm or significant obstructive disease. Proximal Outflow: Normally patent bilateral common femoral arteries and femoral bifurcations. No evidence of pseudoaneurysm after recent arteriographic procedure from right femoral access. Veins: Venous phase imaging demonstrates no venous abnormalities. Stable appearance of indwelling infrarenal IVC filter. Review of the MIP images confirms the above findings. NON-VASCULAR Lower chest: Small hiatal hernia. Hepatobiliary: Stable scattered small hepatic cysts. The gallbladder is unremarkable. No biliary ductal dilatation. Pancreas: Unremarkable. No pancreatic ductal dilatation or surrounding inflammatory changes. Spleen: Normal in size without focal abnormality. Adrenals/Urinary Tract: Stable renal cysts bilaterally including large upper pole right renal cyst. No hydronephrosis. The bladder contains high density urine related to excretion of contrast. Stomach/Bowel: On the current study, there is some higher density material present in the colon prior to contrast administration which limits evaluation for GI bleed. This material does not change in appearance on the arterial and venous phases of imaging after contrast administration and obvious active gastrointestinal bleeding is not identified by CTA. Coils are present in distal right colic branches. No evidence of bowel obstruction, inflammation or free intraperitoneal air. Normal appendix. Lymphatic: No enlarged abdominal or pelvic lymph nodes. Reproductive: Prostate is unremarkable. Other: No retroperitoneal, intraperitoneal or groin hemorrhage. No hernias, ascites or focal fluid collections. Musculoskeletal: No  acute or significant osseous findings. IMPRESSION: 1. No hemorrhagic complication evident after recent arteriography and embolization of a right colonic diverticular arterial bleed. 2. Although there is some higher density material in the colon on the current study which limits evaluation for intraluminal bleeding, there is no visible change in appearance of intraluminal material on arterial and venous phases of imaging to suggest obvious active gastrointestinal bleeding currently. Electronically Signed   By: 72m M.D.   On: 05/13/2020 13:18     Scheduled Meds: . Chlorhexidine Gluconate Cloth  6 each Topical Daily  . feeding supplement  1 Container Oral TID BM  . ferrous sulfate  325 mg Oral TID WC  . furosemide  20 mg Intravenous Once  . insulin aspart  0-9 Units Subcutaneous TID WC  . latanoprost  1 drop Both Eyes QHS  . pantoprazole  40 mg Intravenous Q12H  . potassium  chloride  40 mEq Oral Once  . vitamin C  250 mg Oral TID   Continuous Infusions: . sodium chloride       LOS: 10 days   Time spent: 20  Joseph Art, DO [facility] To contact the attending provider between 7A-7P or the covering provider during after hours 7P-7A, please log into the web site [email or web address] and access using universal [place] password for that web site. If you do not have the password, please call the hospital operator.  05/15/2020, 9:53 AM

## 2020-05-15 NOTE — Progress Notes (Signed)
Received report from April from [facility] about pt. Thomas Larsson, RN assess pt, placed on telemetry, CHG bath done, bottom sheet changed. Wife at bedside. Place call button, phone, and water near pt.

## 2020-05-15 NOTE — Progress Notes (Signed)
Physical Therapy Treatment Patient Details Name: Thomas Joseph MRN: [id number] DOB: December 22, 1933 Today's Date: 05/15/2020    History of Present Illness Pt is an 87 y/o male presenting with new onset of melena.  Pt s/p upper GI 4/26 with no source found.  Colonoscopy 4/27 unrevealing do to poor prep. Bleeding scan showed bleeding in ascending colon. Pt also found to have DVT and PE. On 5/1 pt underwent coil embolization of right colic artery branch and IVC filter placement. PMHx:  CKD, DM, HTN, L TKA    PT Comments    Pt received in supine, agreeable to therapy session and with good participation and tolerance for LE therapeutic exercise instruction and gait progression. Pt able to progress gait distance to 60ft with min guard assist, no loss of balance today and HR max 108 bpm, VSS on RA otherwise. Pt making good progress toward functional mobility goals and dates extended out one more week as they remain appropriate per discussion with supervising PT Graciella Belton. Pt continues to benefit from PT services to progress toward functional mobility goals. Continue to recommend OPPT.  Follow Up Recommendations  Outpatient PT     Equipment Recommendations  Rolling walker with 5" wheels    Recommendations for Other Services       Precautions / Restrictions Precautions Precautions: Fall Restrictions Weight Bearing Restrictions: No    Mobility  Bed Mobility Overal bed mobility: Needs Assistance Bed Mobility: Supine to Sit;Sit to Supine     Supine to sit: Min guard     General bed mobility comments: use of bed features, increased time to perform but no physical assist needed    Transfers Overall transfer level: Needs assistance Equipment used: Rolling walker (2 wheeled) Transfers: Sit to/from Stand Sit to Stand: Min guard         General transfer comment: from EOB<>RW and RW>chair, cues for hand placement, increased time to rise  Ambulation/Gait Ambulation/Gait assistance: Min  guard Gait Distance (Feet): 55 Feet Assistive device: Rolling walker (2 wheeled) Gait Pattern/deviations: Step-through pattern;Decreased step length - left;Decreased step length - right;Decreased stride length;Decreased dorsiflexion - right;Decreased dorsiflexion - left Gait velocity: <0.1 m/s Gait velocity interpretation: <1.8 ft/sec, indicate of risk for recurrent falls General Gait Details: Assist for balance. short stride, when cued for larger steps with improved foot clearance able to improve slightly but very slow/stilted pattern; fair use of RW, RW lowered one click with improved shoulder comfort.   Stairs             Wheelchair Mobility    Modified Rankin (Stroke Patients Only)       Balance Overall balance assessment: Needs assistance Sitting-balance support: No upper extremity supported;Feet supported Sitting balance-Leahy Scale: Fair     Standing balance support: Bilateral upper extremity supported;During functional activity Standing balance-Leahy Scale: Poor Standing balance comment: walker and min guard for static/dynamic standing                            Cognition Arousal/Alertness: Awake/alert Behavior During Therapy: WFL for tasks assessed/performed Overall Cognitive Status: Within Functional Limits for tasks assessed                                 General Comments: participatory      Exercises General Exercises - Lower Extremity Ankle Circles/Pumps: AROM;Both;10 reps;Supine Gluteal Sets: AROM;Both;10 reps;Supine Short Arc Quad: AROM;Both;10 reps;Supine Long Arc  Quad: AROM;Both;10 reps;Seated Heel Slides: AROM;Both;10 reps;Supine Hip ABduction/ADduction: AROM;Both;10 reps;Supine    General Comments General comments (skin integrity, edema, etc.): BP 166/56 (85) supine prior to OOB; BP 176/66 (93) reclined after exertion; HR 69 bpm resting up to 108 bpm with exertion, 70's-80's seated; 6/10 modified RPE (fatigue) after  mobility      Pertinent Vitals/Pain Pain Assessment: No/denies pain Pain Intervention(s): Monitored during session;Repositioned    Home Living                      Prior Function            PT Goals (current goals can now be found in the care plan section) Acute Rehab PT Goals Patient Stated Goal: return to independence, work in the yard PT Goal Formulation: With patient Time For Goal Achievement: 05/21/20 Potential to Achieve Goals: Good Progress towards PT goals: Progressing toward goals    Frequency    Min 3X/week      PT Plan Current plan remains appropriate       AM-PAC PT "6 Clicks" Mobility   Outcome Measure  Help needed turning from your back to your side while in a flat bed without using bedrails?: None Help needed moving from lying on your back to sitting on the side of a flat bed without using bedrails?: A Little Help needed moving to and from a bed to a chair (including a wheelchair)?: A Little Help needed standing up from a chair using your arms (e.g., wheelchair or bedside chair)?: A Little Help needed to walk in hospital room?: A Little Help needed climbing 3-5 steps with a railing? : A Little 6 Click Score: 19    End of Session Equipment Utilized During Treatment: Gait belt Activity Tolerance: Patient tolerated treatment well Patient left: with call bell/phone within reach;in chair;with chair alarm set Nurse Communication: Mobility status PT Visit Diagnosis: Other abnormalities of gait and mobility (R26.89);Difficulty in walking, not elsewhere classified (R26.2)     Time: 9381-0175 PT Time Calculation (min) (ACUTE ONLY): 31 min  Charges:  $Gait Training: 8-22 mins $Therapeutic Exercise: 8-22 mins                     Merrilee Ancona P., PTA Acute Rehabilitation Services Pager: [phone] Office: [phone]   Angus Palms 05/15/2020, 1:46 PM

## 2020-05-15 NOTE — Progress Notes (Signed)
4 Days Post-Op   Subjective/Chief Complaint: NAEO. Denies abd pain, nausea, vomiting. Having increased flatus and reports less distention. Denies BM - states last BM was day of colonoscopy (5/1). Tolerating liquids.   Objective: Vital signs in last 24 hours: Temp:  [97.6 F (36.4 C)-98.1 F (36.7 C)] 97.9 F (36.6 C) (05/05 0713) Pulse Rate:  [66-76] 67 (05/05 0713) Resp:  [15-25] 22 (05/05 0713) BP: (136-185)/(44-70) 178/60 (05/05 0713) SpO2:  [98 %-100 %] 99 % (05/05 0713) Last BM Date: 05/11/20  Intake/Output from previous day: 05/04 0701 - 05/05 0700 In: 770 [P.O.:770] Out: 2100 [Urine:2100] Intake/Output this shift: No intake/output data recorded.  General appearance: alert, cooperative and no distress Cardio: regular rate and rhythm GI: soft, mild distention, +BS, non tender Extremities: extremities normal, atraumatic, no cyanosis or edema  Lab Results:  Recent Labs    05/14/20 0252 05/15/20 0340  WBC 10.3 8.6  HGB 7.4* 7.0*  HCT 22.3* 21.4*  PLT 109* 131*   BMET Recent Labs    05/14/20 0252 05/15/20 0340  NA 137 137  K 3.3* 3.5  CL 110 109  CO2 25 25  GLUCOSE 139* 125*  BUN 24* 20  CREATININE 1.40* 1.36*  CALCIUM 7.4* 7.5*   PT/INR No results for input(s): LABPROT, INR in the last 72 hours. ABG No results for input(s): PHART, HCO3 in the last 72 hours.  Invalid input(s): PCO2, PO2  Studies/Results: CT Angio Abd/Pel w/ and/or w/o  Result Date: 05/13/2020 CLINICAL DATA:  Status post successful transcatheter coil embolization of a right colonic diverticular GI bleed on 05/11/2020. Drop in hemoglobin overnight. EXAM: CT ANGIOGRAPHY ABDOMEN AND PELVIS WITH CONTRAST AND WITHOUT CONTRAST TECHNIQUE: Multidetector CT imaging of the abdomen and pelvis was performed using the standard protocol during bolus administration of intravenous contrast. Multiplanar reconstructed images and MIPs were obtained and reviewed to evaluate the vascular anatomy. CONTRAST:   62mL OMNIPAQUE IOHEXOL 350 MG/ML SOLN COMPARISON:  Prior CTA of the abdomen and pelvis and arteriographic procedure on 05/11/2020 FINDINGS: VASCULAR Aorta: Normal abdominal aorta without evidence atherosclerosis, aneurysm or dissection. Celiac: Normally patent. Normally patent branch vessels and branch vessel anatomy. SMA: Normally patent. Renals: Bilateral paired renal artery supply is stable and demonstrates normal patency. IMA: Normally patent. Inflow: Normally patent bilateral iliac arteries without aneurysm or significant obstructive disease. Proximal Outflow: Normally patent bilateral common femoral arteries and femoral bifurcations. No evidence of pseudoaneurysm after recent arteriographic procedure from right femoral access. Veins: Venous phase imaging demonstrates no venous abnormalities. Stable appearance of indwelling infrarenal IVC filter. Review of the MIP images confirms the above findings. NON-VASCULAR Lower chest: Small hiatal hernia. Hepatobiliary: Stable scattered small hepatic cysts. The gallbladder is unremarkable. No biliary ductal dilatation. Pancreas: Unremarkable. No pancreatic ductal dilatation or surrounding inflammatory changes. Spleen: Normal in size without focal abnormality. Adrenals/Urinary Tract: Stable renal cysts bilaterally including large upper pole right renal cyst. No hydronephrosis. The bladder contains high density urine related to excretion of contrast. Stomach/Bowel: On the current study, there is some higher density material present in the colon prior to contrast administration which limits evaluation for GI bleed. This material does not change in appearance on the arterial and venous phases of imaging after contrast administration and obvious active gastrointestinal bleeding is not identified by CTA. Coils are present in distal right colic branches. No evidence of bowel obstruction, inflammation or free intraperitoneal air. Normal appendix. Lymphatic: No enlarged abdominal  or pelvic lymph nodes. Reproductive: Prostate is unremarkable. Other: No retroperitoneal, intraperitoneal or  groin hemorrhage. No hernias, ascites or focal fluid collections. Musculoskeletal: No acute or significant osseous findings. IMPRESSION: 1. No hemorrhagic complication evident after recent arteriography and embolization of a right colonic diverticular arterial bleed. 2. Although there is some higher density material in the colon on the current study which limits evaluation for intraluminal bleeding, there is no visible change in appearance of intraluminal material on arterial and venous phases of imaging to suggest obvious active gastrointestinal bleeding currently. Electronically Signed   By: Irish Lack M.D.   On: 05/13/2020 13:18    Anti-infectives: Anti-infectives (From admission, onward)   None      Assessment/Plan: 2.4 cm subtle hypoattenuating lesion in the uncinate process of the pancreas - may need further workup with MRI as outpatient  Hx hypertension Hx hyperlipidemia Hx of IDDM  PE s/p IVC filter placement on 5/1 by Dr. Lowella Dandy  ABL anemia 2/2 GI bleed - Admitted 4/25 w/ new onset melena  - EGD on 4/26 that showed medium-sized hiatal hernia but was negative for bleeding. - Bleeding scan on 4/27 that showed active bleeding probably from the ascending colon - Colonoscopy on 4/27 showed poor prep with coffee-ground/old blood in colon and no evidence of active bleeding.  - CT angio on 4/28 negative for bleeding.  - Repeat colonoscopy on 5/1 without revealing source of active bleeding.  - Follow-up CTA on 5/1 was positive for active bleeding in the right colon.  - Patient underwent IR mesenteric arteriogram with coil embolization of right colic artery branch by Dr. Lowella Dandy on 5/1.  - Repeat CTA, 5/3, showed no visible active GI bleed - although there reported some higher density material in the colon limited evaluation.   - No melena or hematochezia since IR procedure.  tolerating FLD.  - continue to monitor hgb/hct (hgb 7.0 from 7.4 yesterday - stable), HR  67-76 bpm, hypertensive. No emergent surgical needs. If H&H remain stable can advance to SOFT diet tomorrow.  - persistent bleeding would likely warrant a total abdominal colectomy and end ileostomy, CCS will follow    LOS: 10 days    Thomas Joseph 05/15/2020

## 2020-05-15 NOTE — Progress Notes (Signed)
Assessed pt  Upon arrival to 6N02 from 2c. Noted bruising LUE, elevated on pillow. Bilateral LEs are edematous but skin intact. Sacral foam placed as preventative measure. Plan of care explained to pt and wife.

## 2020-05-16 DIAGNOSIS — K5900 Constipation, unspecified: Secondary | ICD-10-CM

## 2020-05-16 DIAGNOSIS — I4891 Unspecified atrial fibrillation: Secondary | ICD-10-CM

## 2020-05-16 LAB — CBC
HCT: 21.7 % — ABNORMAL LOW (ref 39.0–52.0)
Hemoglobin: 7.2 g/dL — ABNORMAL LOW (ref 13.0–17.0)
MCH: 30.1 pg (ref 26.0–34.0)
MCHC: 33.2 g/dL (ref 30.0–36.0)
MCV: 90.8 fL (ref 80.0–100.0)
Platelets: 146 10*3/uL — ABNORMAL LOW (ref 150–400)
RBC: 2.39 MIL/uL — ABNORMAL LOW (ref 4.22–5.81)
RDW: 17.2 % — ABNORMAL HIGH (ref 11.5–15.5)
WBC: 7.6 10*3/uL (ref 4.0–10.5)
nRBC: 0 % (ref 0.0–0.2)

## 2020-05-16 LAB — BASIC METABOLIC PANEL
Anion gap: 3 — ABNORMAL LOW (ref 5–15)
BUN: 14 mg/dL (ref 8–23)
CO2: 25 mmol/L (ref 22–32)
Calcium: 7.6 mg/dL — ABNORMAL LOW (ref 8.9–10.3)
Chloride: 107 mmol/L (ref 98–111)
Creatinine, Ser: 1.32 mg/dL — ABNORMAL HIGH (ref 0.61–1.24)
GFR, Estimated: 53 mL/min — ABNORMAL LOW (ref >60)
Glucose, Bld: 127 mg/dL — ABNORMAL HIGH (ref 70–99)
Potassium: 3.8 mmol/L (ref 3.5–5.1)
Sodium: 135 mmol/L (ref 135–145)

## 2020-05-16 LAB — GLUCOSE, CAPILLARY
Glucose-Capillary: 191 mg/dL — ABNORMAL HIGH (ref 70–99)
Glucose-Capillary: 203 mg/dL — ABNORMAL HIGH (ref 70–99)
Glucose-Capillary: 193 mg/dL — ABNORMAL HIGH (ref 70–99)
Glucose-Capillary: 92 mg/dL (ref 70–99)
Glucose-Capillary: 190 mg/dL — ABNORMAL HIGH (ref 70–99)

## 2020-05-16 MED ORDER — FUROSEMIDE 10 MG/ML IJ SOLN
20.0000 mg | Freq: Once | INTRAMUSCULAR | Status: AC
  Administered 2020-05-16: 20 mg via INTRAVENOUS
  Filled 2020-05-16: qty 2

## 2020-05-16 MED ORDER — FERROUS SULFATE 325 (65 FE) MG PO TABS
325.0000 mg | ORAL_TABLET | Freq: Two times a day (BID) | ORAL | Status: DC
  Administered 2020-05-16 – 2020-05-17 (×2): 325 mg via ORAL
  Filled 2020-05-16 (×2): qty 1

## 2020-05-16 MED ORDER — MAGNESIUM SULFATE 2 GM/50ML IV SOLN
2.0000 g | Freq: Once | INTRAVENOUS | Status: AC
  Administered 2020-05-16: 2 g via INTRAVENOUS
  Filled 2020-05-16: qty 50

## 2020-05-16 NOTE — Progress Notes (Signed)
5 Days Post-Op   Subjective/Chief Complaint: Doing well, no bleeding   Objective: Vital signs in last 24 hours: Temp:  [97.4 F (36.3 C)-98.2 F (36.8 C)] 98.2 F (36.8 C) (05/06 0611) Pulse Rate:  [60-90] 60 (05/06 0611) Resp:  [18-23] 18 (05/06 0611) BP: (131-194)/(52-71) 133/52 (05/06 0611) SpO2:  [95 %-100 %] 100 % (05/06 0611) Last BM Date: 05/11/20  Intake/Output from previous day: 05/05 0701 - 05/06 0700 In: 1367 [P.O.:1367] Out: 1300 [Urine:1300] Intake/Output this shift: Total I/O In: 600 [P.O.:600] Out: -   GI: soft nt/nd  Lab Results:  Recent Labs    05/15/20 0340 05/15/20 1559 05/16/20 0311  WBC 8.6  --  7.6  HGB 7.0* 7.9* 7.2*  HCT 21.4* 24.8* 21.7*  PLT 131*  --  146*   BMET Recent Labs    05/15/20 0340 05/16/20 0311  NA 137 135  K 3.5 3.8  CL 109 107  CO2 25 25  GLUCOSE 125* 127*  BUN 20 14  CREATININE 1.36* 1.32*  CALCIUM 7.5* 7.6*   PT/INR No results for input(s): LABPROT, INR in the last 72 hours. ABG No results for input(s): PHART, HCO3 in the last 72 hours.  Invalid input(s): PCO2, PO2  Studies/Results: No results found.  Anti-infectives: Anti-infectives (From admission, onward)   None      Assessment/Plan: ABL anemia- GI bleed likely right colon - Admitted 4/25 w/new onset melena -EGD on 4/26 that showed medium-sized hiatal herniabutwas negative for bleeding. - Bleeding scan on4/27that showed active bleeding probably from the ascending colon -Colonoscopy on 4/27 showed poor prep with coffee-ground/old blood in colon and no evidence of active bleeding. -CT angio on 4/28 negative for bleeding. -Repeat colonoscopy on 5/1 without revealing source of active bleeding. -Follow-up CTA on 5/1was positive foractivebleeding inthe right colon.  - Patient underwent IRmesenteric arteriogram with coil embolization of right colic artery branchby Dr. Lowella Dandy on 5/1. - Repeat CTA, 5/3,showed no visible active GI  bleed-although there reported some higher density material in the colonlimited evaluation. -No melena or hematochezia since IR procedure.tolerating FLD. Will advance to soft diet today  - continue to monitor hgb/hct and clinical picture - persistent bleeding would warrant surgery (this could be right colectomy)  Emelia Loron 05/16/2020

## 2020-05-16 NOTE — Progress Notes (Signed)
Pt c/o feeling unwell while sitting in bathroom having a wash. observed to be clammy around the head, v/s within limits, now laying back in bed and says he feels a little winded,MD text paged

## 2020-05-16 NOTE — Progress Notes (Signed)
PROGRESS NOTE   Thomas Joseph  ERD:[id number] DOB: 14-Jun-1933 DOA: 05/05/2020 PCP: Mila Palmer, MD  Brief Narrative:   History of Present Illness:  87 year old male with past medical history as below, which is significant for chronic kidney disease, diabetes mellitus, hypertension, and anemia.  He was admitted to [facility] after new onset melena on 4/25 and hemoglobin dropped 12.7 and 9.6.  Transfuse 1 unit PRBC at that time.  Gastroenterology was consulted at which point patient underwent EGD with no obvious source of bleeding.  He underwent colonoscopy on 4/27 which was concerning for lower GI bleed.  He had a bleeding scan and IR was consulted demonstrating bleeding in the ascending colon.  He underwent mesenteric angiography and coiling on 5/1.  Over the course of his hospitalization he received 12 units of PRBC.  Course then complicated on 5/3 by hemoglobin drop to 6.2 requiring additional transfusion He has dropped his hemoglobin again 5/3 has been transfused 1 more unit and general surgery has been consulted with regards to elective hemicolectomy to me versus total abdominal colectomy and end ileostomy.  No further bleeding seen thus far    Hospital-Problem based course  Severe anemia due to Lower GI bleeding in ascending colon  -s/p Embolization right colic artery 5/1 Dr. Lowella Dandy -s/p 12 U blood transfusions total -Colonoscopy 5/1 showed blood in colon -Recurrent bleeding 5/3 General surgery consulted:  Hemicolectomy versus total colectomy plus and ileostomy if continues to bleed -IV lasix again today- recheck h/h in AM-- may need 1 unit PRBC  Paroxysmal A. fib Occurred in the setting of large-volume GI bleed-keep on monitors Is predominantly in sinus rhythm at this time -no blood thinner due to GI bleed  Compensated HFpEF-EF 70 to 75% this admission severe left ventricular concentric hypertrophy  ?  Lower extremity DVT-small pulmonary embolism on imaging Not a  candidate for anticoagulation given massive bleed-subrenal artery IVC filter placed 5/1 Lower extremity duplex + DVT 5/1  Hematoma left arm at midline site Extravasation as well as hematoma on left arm--midline removed  Leukocytosis secondary to stress of GI bleed Etiology unclear--has been on steroids prior to admission-no further work-up  Hypomagnesemia  -replete  DM TY 2 Continue sliding scale alone  Holding metformin 1000 daily  AKi on admit  -trending down, avoid hypotension   DVT prophylaxis:  SCD  code Status: Full Family Communication: Discussed with patient's wife 5/6 Disposition:  Status is: Inpatient Remains inpatient appropriate because:Ongoing diagnostic testing needed not appropriate for outpatient work up and Unsafe d/c plan  Dispo: The patient is from: Home              Anticipated d/c is to: Home              Patient currently is not medically stable to d/c.   Difficult to place patient No    Consultants:   Gastroenterology  Interventional radiology  General surgery   Procedures:  Upper endoscopy 4/26 Colonoscopy 4/27 Bleeding scan 4/27 Multiphase CT 4/28 showing no bleeding Mesenteric arteriogram/IVC filter 5/1 1) IVC filter placed below renal veins 2) Active bleeding from right colic artery branch.  Successful coil embolization of feeding branch to bleeding site. 3) Right groin closure with Angioseal device.  Lower extremity duplex RIGHT:  - Findings consistent with age indeterminate deep vein thrombosis  involving the right peroneal veins.     LEFT:  - Findings consistent with age indeterminate deep vein thrombosis  involving the left posterior tibial veins, and  left peroneal veins.      Echocardiogram 5/1 =  1. Left ventricular ejection fraction, by estimation, is 70 to 75%. The  left ventricle has hyperdynamic function. The left ventricle has no  regional wall motion abnormalities. There is severe concentric left  ventricular  hypertrophy. Left ventricular  diastolic parameters are consistent with Grade I diastolic dysfunction  (impaired relaxation).  2. Right ventricular systolic function is normal. The right ventricular  size is normal. There is moderately elevated pulmonary artery systolic  pressure. The estimated right ventricular systolic pressure is 47.4 mmHg.  3. The pericardial effusion is circumferential. There is no evidence of  cardiac tamponade.  4. The mitral valve is normal in structure. No evidence of mitral valve  regurgitation. No evidence of mitral stenosis.  5. Tricuspid valve regurgitation is mild to moderate.  6. The aortic valve is tricuspid. There is mild calcification of the  aortic valve. There is mild thickening of the aortic valve. Aortic valve  regurgitation is not visualized. Mild aortic valve sclerosis is present,  with no evidence of aortic valve  stenosis.  7. The inferior vena cava is normal in size with greater than 50%  respiratory variability, suggesting right atrial pressure of 3 mmHg.   Antimicrobials: None   Subjective: Felt fine this AM-- had some sweating when he got up to the toilet later in the AM     Objective: Vitals:   05/15/20 2156 05/15/20 2216 05/16/20 0206 05/16/20 0611  BP: (!) 194/57 131/71 (!) 186/57 (!) 133/52  Pulse: 70 68 67 60  Resp: (!) 23  (!) 21 18  Temp: 97.7 F (36.5 C)  (!) 97.4 F (36.3 C) 98.2 F (36.8 C)  TempSrc: Oral  Oral Oral  SpO2: 99%  100% 100%  Weight:      Height:        Intake/Output Summary (Last 24 hours) at 05/16/2020 0850 Last data filed at 05/16/2020 0740 Gross per 24 hour  Intake 1667 ml  Output 1300 ml  Net 367 ml   Filed Weights   05/05/20 1022 05/07/20 0831 05/11/20 0820  Weight: 102 kg 102 kg 102 kg    Examination:   General: Appearance:     Overweight male in no acute distress     Lungs:     Crespirations unlabored  Heart:    Normal heart rate. irr.   MS:   All extremities are intact.    Neurologic:   Awake, alert, oriented x 3. No apparent focal neurological           defect.        Data Reviewed: personally reviewed   CBC    Component Value Date/Time   WBC 7.6 05/16/2020 0311   RBC 2.39 (L) 05/16/2020 0311   HGB 7.2 (L) 05/16/2020 0311   HCT 21.7 (L) 05/16/2020 0311   PLT 146 (L) 05/16/2020 0311   MCV 90.8 05/16/2020 0311   MCH 30.1 05/16/2020 0311   MCHC 33.2 05/16/2020 0311   RDW 17.2 (H) 05/16/2020 0311   LYMPHSABS 0.8 05/15/2020 0340   MONOABS 0.8 05/15/2020 0340   EOSABS 0.3 05/15/2020 0340   BASOSABS 0.0 05/15/2020 0340   CMP Latest Ref Rng & Units 05/16/2020 05/15/2020 05/14/2020  Glucose 70 - 99 mg/dL 347(Q) 259(D) 638(V)  BUN 8 - 23 mg/dL 14 20 56(E)  Creatinine 0.61 - 1.24 mg/dL 3.32(R) 5.18(A) 4.16(S)  Sodium 135 - 145 mmol/L 135 137 137  Potassium 3.5 - 5.1 mmol/L 3.8 3.5  3.3(L)  Chloride 98 - 111 mmol/L 107 109 110  CO2 22 - 32 mmol/L 25 25 25   Calcium 8.9 - 10.3 mg/dL 7.6(L) 7.5(L) 7.4(L)  Total Protein 6.5 - 8.1 g/dL - 3.7(L) 3.8(L)  Total Bilirubin 0.3 - 1.2 mg/dL - 0.6 0.7  Alkaline Phos 38 - 126 U/L - 54 46  AST 15 - 41 U/L - 35 43(H)  ALT 0 - 44 U/L - 17 17     Radiology Studies: No results found.   Scheduled Meds: . vitamin C  250 mg Oral TID  . Chlorhexidine Gluconate Cloth  6 each Topical Daily  . feeding supplement  1 Container Oral TID BM  . ferrous sulfate  325 mg Oral TID WC  . furosemide  20 mg Intravenous Once  . insulin aspart  0-9 Units Subcutaneous TID WC  . latanoprost  1 drop Both Eyes QHS  . pantoprazole  40 mg Oral BID  . polyethylene glycol  17 g Oral TID   Continuous Infusions: . sodium chloride    . magnesium sulfate bolus IVPB 2 g (05/16/20 0834)     LOS: 11 days   Time spent: 20  21, DO [facility] To contact the attending provider between 7A-7P or the covering provider during after hours 7P-7A, please log into the web site [email or web address] and access using universal [place]  password for that web site. If you do not have the password, please call the hospital operator.  05/16/2020, 8:50 AM

## 2020-05-16 NOTE — Progress Notes (Signed)
VAST RN completing line care when noted patient's entire right arm is swollen. Pt's left arm is also swollen, but less than right. Advised patient's nurse of findings and suggested contacting physician for  possible follow-up for DVT.

## 2020-05-16 NOTE — Progress Notes (Signed)
PT Cancellation Note  Patient Details Name: Thomas Joseph MRN: [id number] DOB: 12/11/1933   Cancelled Treatment:    Reason Eval/Treat Not Completed: Other (comment). Per nursing pt feeling bad earlier and recommended deferring PT right now. Will continue to follow.    Angelina Ok [facility] 05/16/2020, 3:39 PM Skip Mayer PT Acute Rehabilitation Services Pager [phone] Office [phone]

## 2020-05-17 LAB — CBC
HCT: 23.2 % — ABNORMAL LOW (ref 39.0–52.0)
Hemoglobin: 7.5 g/dL — ABNORMAL LOW (ref 13.0–17.0)
MCH: 29.5 pg (ref 26.0–34.0)
MCHC: 32.3 g/dL (ref 30.0–36.0)
MCV: 91.3 fL (ref 80.0–100.0)
Platelets: 175 10*3/uL (ref 150–400)
RBC: 2.54 MIL/uL — ABNORMAL LOW (ref 4.22–5.81)
RDW: 17.2 % — ABNORMAL HIGH (ref 11.5–15.5)
WBC: 7.4 10*3/uL (ref 4.0–10.5)
nRBC: 0 % (ref 0.0–0.2)

## 2020-05-17 LAB — BASIC METABOLIC PANEL
Anion gap: 5 (ref 5–15)
BUN: 12 mg/dL (ref 8–23)
CO2: 27 mmol/L (ref 22–32)
Calcium: 8 mg/dL — ABNORMAL LOW (ref 8.9–10.3)
Chloride: 104 mmol/L (ref 98–111)
Creatinine, Ser: 1.33 mg/dL — ABNORMAL HIGH (ref 0.61–1.24)
GFR, Estimated: 53 mL/min — ABNORMAL LOW (ref >60)
Glucose, Bld: 165 mg/dL — ABNORMAL HIGH (ref 70–99)
Potassium: 3.8 mmol/L (ref 3.5–5.1)
Sodium: 136 mmol/L (ref 135–145)

## 2020-05-17 LAB — MAGNESIUM: Magnesium: 1.9 mg/dL (ref 1.7–2.4)

## 2020-05-17 LAB — HEMOGLOBIN AND HEMATOCRIT, BLOOD
HCT: 24.2 % — ABNORMAL LOW (ref 39.0–52.0)
Hemoglobin: 7.9 g/dL — ABNORMAL LOW (ref 13.0–17.0)

## 2020-05-17 LAB — GLUCOSE, CAPILLARY
Glucose-Capillary: 123 mg/dL — ABNORMAL HIGH (ref 70–99)
Glucose-Capillary: 157 mg/dL — ABNORMAL HIGH (ref 70–99)
Glucose-Capillary: 243 mg/dL — ABNORMAL HIGH (ref 70–99)
Glucose-Capillary: 177 mg/dL — ABNORMAL HIGH (ref 70–99)

## 2020-05-17 LAB — IRON AND TIBC
Iron: 29 ug/dL — ABNORMAL LOW (ref 45–182)
Saturation Ratios: 17 % — ABNORMAL LOW (ref 17.9–39.5)
TIBC: 175 ug/dL — ABNORMAL LOW (ref 250–450)
UIBC: 146 ug/dL

## 2020-05-17 MED ORDER — MAGNESIUM SULFATE 2 GM/50ML IV SOLN
2.0000 g | Freq: Once | INTRAVENOUS | Status: AC
  Administered 2020-05-17: 2 g via INTRAVENOUS
  Filled 2020-05-17: qty 50

## 2020-05-17 MED ORDER — LISINOPRIL 20 MG PO TABS
20.0000 mg | ORAL_TABLET | Freq: Every day | ORAL | Status: DC
  Administered 2020-05-17 – 2020-05-20 (×4): 20 mg via ORAL
  Filled 2020-05-17 (×5): qty 1

## 2020-05-17 MED ORDER — LISINOPRIL-HYDROCHLOROTHIAZIDE 20-12.5 MG PO TABS: 1.0000 | ORAL_TABLET | Freq: Every day | ORAL | Status: DC

## 2020-05-17 MED ORDER — SODIUM CHLORIDE 0.9 % IV SOLN
510.0000 mg | Freq: Once | INTRAVENOUS | Status: AC
  Administered 2020-05-17: 510 mg via INTRAVENOUS
  Filled 2020-05-17: qty 17

## 2020-05-17 MED ORDER — HYDROCHLOROTHIAZIDE 12.5 MG PO CAPS
12.5000 mg | ORAL_CAPSULE | Freq: Every day | ORAL | Status: DC
  Administered 2020-05-17 – 2020-05-20 (×4): 12.5 mg via ORAL
  Filled 2020-05-17 (×4): qty 1

## 2020-05-17 MED ORDER — BISACODYL 10 MG RE SUPP
10.0000 mg | Freq: Once | RECTAL | Status: AC
  Administered 2020-05-17: 10 mg via RECTAL
  Filled 2020-05-17: qty 1

## 2020-05-17 MED ORDER — POTASSIUM CHLORIDE CRYS ER 20 MEQ PO TBCR
40.0000 meq | EXTENDED_RELEASE_TABLET | Freq: Once | ORAL | Status: AC
  Administered 2020-05-17: 40 meq via ORAL
  Filled 2020-05-17: qty 2

## 2020-05-17 NOTE — Progress Notes (Signed)
Pt went to have a bowel movement in bathroom and passed out some fresh blood . MD text paged

## 2020-05-17 NOTE — Plan of Care (Signed)

## 2020-05-17 NOTE — Progress Notes (Signed)
PROGRESS NOTE   Thomas Joseph  WNU:[id number] DOB: [birth year] DOA: 05/05/2020 PCP: Mila Palmer, MD  Brief Narrative:   History of Present Illness:  87 year old male with past medical history as below, which is significant for chronic kidney disease, diabetes mellitus, hypertension, and anemia.  He was admitted to [facility] after new onset melena on 4/25 and hemoglobin dropped 12.7 and 9.6.  Transfuse 1 unit PRBC at that time.  Gastroenterology was consulted at which point patient underwent EGD with no obvious source of bleeding.  He underwent colonoscopy on 4/27 which was concerning for lower GI bleed.  He had a bleeding scan and IR was consulted demonstrating bleeding in the ascending colon.  He underwent mesenteric angiography and coiling on 5/1.  Over the course of his hospitalization he received 12 units of PRBC.  Course then complicated on 5/3 by hemoglobin drop to 6.2 requiring additional transfusion He has dropped his hemoglobin again 5/3 has been transfused 1 more unit and general surgery has been consulted with regards to elective hemicolectomy to me versus total abdominal colectomy and end ileostomy.  No further bleeding seen thus far    Hospital-Problem based course  Severe anemia due to Lower GI bleeding in ascending colon  -s/p Embolization right colic artery 5/1 Dr. Lowella Dandy -s/p 12 U blood transfusions total -Colonoscopy 5/1 showed blood in colon -Recurrent bleeding 5/3 General surgery consulted:  Hemicolectomy versus total colectomy plus and ileostomy if continues to bleed -h/h stable -d/c PO Fe and give IV iron  Paroxysmal A. fib Occurred in the setting of large-volume GI bleed-keep on monitors Is predominantly in sinus rhythm at this time -no blood thinner due to GI bleed  Compensated HFpEF-EF 70 to 75% this admission severe left ventricular concentric hypertrophy  ?  Lower extremity DVT-small pulmonary embolism on imaging Not a candidate for  anticoagulation given massive bleed-subrenal artery IVC filter placed 5/1 Lower extremity duplex + DVT 5/1  Hematoma left arm at midline site Extravasation as well as hematoma on left arm--midline removed  Leukocytosis secondary to stress of GI bleed Etiology unclear--has been on steroids prior to admission-no further work-up  Hypomagnesemia  -replete  DM TY 2 Continue sliding scale alone  Holding metformin 1000 daily  AKi on CKD stage 2  -trending down, avoid hypotension  Was on steroids for PMR- have stopped  DVT prophylaxis:  SCD  code Status: Full Family Communication: Discussed with patient's wife 5/7 Disposition:  Status is: Inpatient Remains inpatient appropriate because:Ongoing diagnostic testing needed not appropriate for outpatient work up and Unsafe d/c plan  Dispo: The patient is from: Home              Anticipated d/c is to: Home              Patient currently is not medically stable to d/c.   Difficult to place patient No    Consultants:   Gastroenterology  Interventional radiology  General surgery   Procedures:  Upper endoscopy 4/26 Colonoscopy 4/27 Bleeding scan 4/27 Multiphase CT 4/28 showing no bleeding Mesenteric arteriogram/IVC filter 5/1 1) IVC filter placed below renal veins 2) Active bleeding from right colic artery branch.  Successful coil embolization of feeding branch to bleeding site. 3) Right groin closure with Angioseal device.  Lower extremity duplex RIGHT:  - Findings consistent with age indeterminate deep vein thrombosis  involving the right peroneal veins.     LEFT:  - Findings consistent with age indeterminate deep vein thrombosis  involving the  left posterior tibial veins, and left peroneal veins.      Echocardiogram 5/1 =  1. Left ventricular ejection fraction, by estimation, is 70 to 75%. The  left ventricle has hyperdynamic function. The left ventricle has no  regional wall motion abnormalities. There is severe  concentric left  ventricular hypertrophy. Left ventricular  diastolic parameters are consistent with Grade I diastolic dysfunction  (impaired relaxation).  2. Right ventricular systolic function is normal. The right ventricular  size is normal. There is moderately elevated pulmonary artery systolic  pressure. The estimated right ventricular systolic pressure is 47.4 mmHg.  3. The pericardial effusion is circumferential. There is no evidence of  cardiac tamponade.  4. The mitral valve is normal in structure. No evidence of mitral valve  regurgitation. No evidence of mitral stenosis.  5. Tricuspid valve regurgitation is mild to moderate.  6. The aortic valve is tricuspid. There is mild calcification of the  aortic valve. There is mild thickening of the aortic valve. Aortic valve  regurgitation is not visualized. Mild aortic valve sclerosis is present,  with no evidence of aortic valve  stenosis.  7. The inferior vena cava is normal in size with greater than 50%  respiratory variability, suggesting right atrial pressure of 3 mmHg.   Antimicrobials: None   Subjective: No BM but still having gas     Objective: Vitals:   05/16/20 1359 05/16/20 2200 05/17/20 0500 05/17/20 0509  BP: (!) 154/44 (!) 186/57  (!) 151/55  Pulse: 62 66  61  Resp:  20  20  Temp: 98 F (36.7 C) 98.2 F (36.8 C)  98.1 F (36.7 C)  TempSrc: Oral Oral  Oral  SpO2: 100% 100%  99%  Weight:   110 kg   Height:        Intake/Output Summary (Last 24 hours) at 05/17/2020 1004 Last data filed at 05/17/2020 0514 Gross per 24 hour  Intake 660 ml  Output 3900 ml  Net -3240 ml   Filed Weights   05/07/20 0831 05/11/20 0820 05/17/20 0500  Weight: 102 kg 102 kg 110 kg    Examination:  General: Appearance:    Obese male in no acute distress     Lungs:     respirations unlabored  Heart:    Normal heart rate. irr  MS:   All extremities are intact. + edema  Neurologic:   Awake, alert, oriented x 3. No  apparent focal neurological           defect.         Data Reviewed: personally reviewed   CBC    Component Value Date/Time   WBC 7.4 05/17/2020 0328   RBC 2.54 (L) 05/17/2020 0328   HGB 7.5 (L) 05/17/2020 0328   HCT 23.2 (L) 05/17/2020 0328   PLT 175 05/17/2020 0328   MCV 91.3 05/17/2020 0328   MCH 29.5 05/17/2020 0328   MCHC 32.3 05/17/2020 0328   RDW 17.2 (H) 05/17/2020 0328   LYMPHSABS 0.8 05/15/2020 0340   MONOABS 0.8 05/15/2020 0340   EOSABS 0.3 05/15/2020 0340   BASOSABS 0.0 05/15/2020 0340   CMP Latest Ref Rng & Units 05/17/2020 05/16/2020 05/15/2020  Glucose 70 - 99 mg/dL 213(Y) 865(H) 846(N)  BUN 8 - 23 mg/dL 12 14 20   Creatinine 0.61 - 1.24 mg/dL ) 6.29(B) 2.84(X)  Sodium 135 - 145 mmol/L 136 135 137  Potassium 3.5 - 5.1 mmol/L 3.8 3.8 3.5  Chloride 98 - 111 mmol/L 104 107 109  CO2 22 - 32 mmol/L 27 25 25   Calcium 8.9 - 10.3 mg/dL 8.0(L) 7.6(L) 7.5(L)  Total Protein 6.5 - 8.1 g/dL - - 3.7(L)  Total Bilirubin 0.3 - 1.2 mg/dL - - 0.6  Alkaline Phos 38 - 126 U/L - - 54  AST 15 - 41 U/L - - 35  ALT 0 - 44 U/L - - 17     Radiology Studies: No results found.   Scheduled Meds: . vitamin C  250 mg Oral TID  . Chlorhexidine Gluconate Cloth  6 each Topical Daily  . feeding supplement  1 Container Oral TID BM  . lisinopril  20 mg Oral Daily   And  . hydrochlorothiazide  12.5 mg Oral Daily  . insulin aspart  0-9 Units Subcutaneous TID WC  . latanoprost  1 drop Both Eyes QHS  . pantoprazole  40 mg Oral BID  . polyethylene glycol  17 g Oral TID   Continuous Infusions: . sodium chloride    . ferumoxytol 510 mg (05/17/20 0958)     LOS: 12 days   Time spent: 20  21, DO [facility] To contact the attending provider between 7A-7P or the covering provider during after hours 7P-7A, please log into the web site [email or web address] and access using universal [place] password for that web site. If you do not have the password, please call the  hospital operator.  05/17/2020, 10:04 AM

## 2020-05-17 NOTE — Progress Notes (Signed)
86yoM h/o CKD, pAF, acute PE (s/p IVC filter 5/1), LVH with HFpEF here since 4/27 with GI bleed.   Total of 12 u pRBC since admission (although appears one extravasated and is mostly in the soft tissue of the LUE).  He has undergone considerable workup and ultimately embolization for what appears to be a right colic branch diverticular bleed (of note, he has diverticular disease throughout the entire colon, but only the right side has been implicated in bleeding thus far as detailed below):  4/26 upper GI endoscopy = no upper GI hemorrhage 4/27 tagged RBC scan = R colon hemorrhage 4/27 colonoscopy = poor prep, blood throughout colon, no source identified 4/28 CTA a/p = no ongoing hemorrhage 5/1 repeat colonoscopy = poor prep, blood throughout colon, no source identified 5/1 CTA a/p = R colon hemorrhage 5/1 SMA angiography = R colic branch hemorrhage, controlled with embolization 5/3 CTA a/p = no ongoing hemorrhage.  Surgery has been following and ultimately recommends right colectomy for further hemorrhage given that he has essentially exhausted alternative management options.   He has been doing quite well besides a subtle hemoglobin drop 5/3 without evidence of bleeding and no further issues until he had his first bowel movement since his last endoscopy (six days ago) and had some dark red blood in it per the patient tonight. He was unsurprised and unalarmed by this given that he was advised by GI that he would have some blood in his next BM after the procedure based on their intraprocedural findings.  On exam, vitals normal, mentation normal, abdominal exam benign (scar from umbilical herniorrhaphy noted), good historian.  In summary, I think that the best (but not only) explanation for dark blood in his first BM since endoscopy today is that it is old blood retained from the prior bleed. However, I agree with Dr. Benjamine Mola that he should be carefully monitored overnight, repeat HH now, and repeat  HH in morning. Should he manifest repeat hemorrhage (declining HH with attendant new transfusion requirement, vital sign derangements, ongoing frank blood per rectum) he should have his coags optimized, transfuse to target Hg > 8.0, and NPO in prep for OR tomorrow.  Discussed with Dr. Derrell Lolling.

## 2020-05-17 NOTE — Progress Notes (Signed)
Patient ID: Thomas Joseph, male   DOB: [birth year], 87 y.o.   MRN: [id number] [facility] Progress Note:   6 Days Post-Op  Subjective: Mental status is alert and appropriate .  Complaints -no BM in several days. Objective: Vital signs in last 24 hours: Temp:  [98 F (36.7 C)-98.3 F (36.8 C)] 98.1 F (36.7 C) (05/07 0509) Pulse Rate:  [61-88] 61 (05/07 0509) Resp:  [18-20] 20 (05/07 0509) BP: (118-186)/(44-57) 151/55 (05/07 0509) SpO2:  [98 %-100 %] 99 % (05/07 0509) Weight:  [110 kg] 110 kg (05/07 0500)  Intake/Output from previous day: 05/06 0701 - 05/07 0700 In: 1260 [P.O.:1260] Out: 3900 [Urine:3900] Intake/Output this shift: No intake/output data recorded.  Physical Exam: Work of breathing is not labored.    Lab Results:  Results for orders placed or performed during the hospital encounter of 05/05/20 (from the past 48 hour(s))  Glucose, capillary     Status: Abnormal   Collection Time: 05/15/20 10:58 AM  Result Value Ref Range   Glucose-Capillary 199 (H) 70 - 99 mg/dL    Comment: Glucose reference range applies only to samples taken after fasting for at least 8 hours.   Comment 1 Notify RN   Hemoglobin and hematocrit, blood     Status: Abnormal   Collection Time: 05/15/20  3:59 PM  Result Value Ref Range   Hemoglobin 7.9 (L) 13.0 - 17.0 g/dL   HCT 33.8 (L) 25.0 - 53.9 %    Comment: Performed at [facility], [address], [place], [place] [address]  Glucose, capillary     Status: Abnormal   Collection Time: 05/15/20  4:29 PM  Result Value Ref Range   Glucose-Capillary 183 (H) 70 - 99 mg/dL    Comment: Glucose reference range applies only to samples taken after fasting for at least 8 hours.  Glucose, capillary     Status: Abnormal   Collection Time: 05/15/20  9:51 PM  Result Value Ref Range   Glucose-Capillary 192 (H) 70 - 99 mg/dL    Comment: Glucose reference range applies only to samples taken after fasting for at least 8 hours.  CBC      Status: Abnormal   Collection Time: 05/16/20  3:11 AM  Result Value Ref Range   WBC 7.6 4.0 - 10.5 K/uL   RBC 2.39 (L) 4.22 - 5.81 MIL/uL   Hemoglobin 7.2 (L) 13.0 - 17.0 g/dL   HCT 19.3 (L) 79.0 - 24.0 %   MCV 90.8 80.0 - 100.0 fL   MCH 30.1 26.0 - 34.0 pg   MCHC 33.2 30.0 - 36.0 g/dL   RDW 97.3 (H) 53.2 - 99.2 %   Platelets 146 (L) 150 - 400 K/uL   nRBC 0.0 0.0 - 0.2 %    Comment: Performed at [facility], [address], [place], [place] [address]  Basic metabolic panel     Status: Abnormal   Collection Time: 05/16/20  3:11 AM  Result Value Ref Range   Sodium 135 135 - 145 mmol/L   Potassium 3.8 3.5 - 5.1 mmol/L   Chloride 107 98 - 111 mmol/L   CO2 25 22 - 32 mmol/L   Glucose, Bld 127 (H) 70 - 99 mg/dL    Comment: Glucose reference range applies only to samples taken after fasting for at least 8 hours.   BUN 14 8 - 23 mg/dL   Creatinine, Ser 4.19 (H) 0.61 - 1.24 mg/dL   Calcium 7.6 (L)  8.9 - 10.3 mg/dL   GFR, Estimated 53 (L) >60 mL/min    Comment: (NOTE) Calculated using the CKD-EPI Creatinine Equation (2021)    Anion gap 3 (L) 5 - 15    Comment: Performed at [facility], [address], [place], [place] [address]  Glucose, capillary     Status: Abnormal   Collection Time: 05/16/20  7:40 AM  Result Value Ref Range   Glucose-Capillary 191 (H) 70 - 99 mg/dL    Comment: Glucose reference range applies only to samples taken after fasting for at least 8 hours.  Glucose, capillary     Status: Abnormal   Collection Time: 05/16/20 10:04 AM  Result Value Ref Range   Glucose-Capillary 203 (H) 70 - 99 mg/dL    Comment: Glucose reference range applies only to samples taken after fasting for at least 8 hours.  Glucose, capillary     Status: Abnormal   Collection Time: 05/16/20 11:50 AM  Result Value Ref Range   Glucose-Capillary 193 (H) 70 - 99 mg/dL    Comment: Glucose reference range applies only to samples taken after fasting for at least 8 hours.  Glucose,  capillary     Status: None   Collection Time: 05/16/20  5:14 PM  Result Value Ref Range   Glucose-Capillary 92 70 - 99 mg/dL    Comment: Glucose reference range applies only to samples taken after fasting for at least 8 hours.  Glucose, capillary     Status: Abnormal   Collection Time: 05/16/20  9:41 PM  Result Value Ref Range   Glucose-Capillary 190 (H) 70 - 99 mg/dL    Comment: Glucose reference range applies only to samples taken after fasting for at least 8 hours.  CBC     Status: Abnormal   Collection Time: 05/17/20  3:28 AM  Result Value Ref Range   WBC 7.4 4.0 - 10.5 K/uL   RBC 2.54 (L) 4.22 - 5.81 MIL/uL   Hemoglobin 7.5 (L) 13.0 - 17.0 g/dL   HCT 49.7 (L) 02.6 - 37.8 %   MCV 91.3 80.0 - 100.0 fL   MCH 29.5 26.0 - 34.0 pg   MCHC 32.3 30.0 - 36.0 g/dL   RDW 58.8 (H) 50.2 - 77.4 %   Platelets 175 150 - 400 K/uL   nRBC 0.0 0.0 - 0.2 %    Comment: Performed at [facility], [address], [place], [place] [address]  Basic metabolic panel     Status: Abnormal   Collection Time: 05/17/20  3:28 AM  Result Value Ref Range   Sodium 136 135 - 145 mmol/L   Potassium 3.8 3.5 - 5.1 mmol/L   Chloride 104 98 - 111 mmol/L   CO2 27 22 - 32 mmol/L   Glucose, Bld 165 (H) 70 - 99 mg/dL    Comment: Glucose reference range applies only to samples taken after fasting for at least 8 hours.   BUN 12 8 - 23 mg/dL   Creatinine, Ser 6.76 (H) 0.61 - 1.24 mg/dL   Calcium 8.0 (L) 8.9 - 10.3 mg/dL   GFR, Estimated 53 (L) >60 mL/min    Comment: (NOTE) Calculated using the CKD-EPI Creatinine Equation (2021)    Anion gap 5 5 - 15    Comment: Performed at [facility], [address] [place], [place] [address]  Iron and TIBC     Status: Abnormal   Collection Time: 05/17/20  3:28 AM  Result Value Ref  Range   Iron 29 (L) 45 - 182 ug/dL   TIBC 381 (L) 829 - 937 ug/dL   Saturation Ratios 17 (L) 17.9 - 39.5 %   UIBC 146 ug/dL    Comment: Performed at [facility], [address] [place], [place] [address]  Glucose, capillary     Status: Abnormal   Collection Time: 05/17/20  7:48 AM  Result Value Ref Range   Glucose-Capillary 123 (H) 70 - 99 mg/dL    Comment: Glucose reference range applies only to samples taken after fasting for at least 8 hours.    Radiology/Results: No results found.  Anti-infectives: Anti-infectives (From admission, onward)   None      Assessment/Plan: Problem List: Patient Active Problem List   Diagnosis Date Noted  . Hypomagnesemia 05/11/2020  . Atrial fibrillation with RVR (HCC) 05/11/2020  . GI bleed 05/05/2020  . AKI (acute kidney injury) (HCC)   . 2019 novel coronavirus disease (COVID-19) 03/07/2020  . Anemia 03/07/2020  . Arthropathy 03/07/2020  . Body mass index (BMI) 32.0-32.9, adult 03/07/2020  . Body mass index (BMI) 34.0-34.9, adult 03/07/2020  . Chronic kidney disease (CKD) stage G3a/A1, moderately decreased glomerular filtration rate (GFR) between 45-59 mL/min/1.73 square meter and albuminuria creatinine ratio less than 30 mg/g (HCC) 03/07/2020  . Chronic sinusitis 03/07/2020  . Constipation 03/07/2020  . Diabetic renal disease (HCC) 03/07/2020  . Hardening of the aorta (main artery of the heart) (HCC) 03/07/2020  . Hyperlipidemia 03/07/2020  . Microscopic hematuria 03/07/2020  . Nausea 03/07/2020  . Other abnormalities of gait and mobility 03/07/2020  . Personal history of colonic polyps 03/07/2020  . Pneumonia due to SARS-associated coronavirus 03/07/2020  . Primary open-angle glaucoma, right eye, mild stage 03/07/2020  . Degeneration of lumbar intervertebral disc 03/07/2020  . Radiculopathy due to lumbar intervertebral disc disorder 03/07/2020  . Right lower quadrant pain 03/07/2020  . S/P TURP (status post transurethral resection of prostate) 03/07/2020  . Secondary hyperparathyroidism of renal origin (HCC) 03/07/2020  . Senile purpura (HCC) 03/07/2020  . Sequelae of other specified infectious and parasitic  diseases 03/07/2020  . Stricture of artery (HCC) 03/07/2020  . Subclinical hypothyroidism 03/07/2020  . History of COVID-19 04/11/2019  . Shortness of breath 04/11/2019  . Irregular heart rhythm 04/11/2019  . Fatigue 04/11/2019  . Generalized weakness 04/11/2019  . Acute respiratory failure with hypoxia (HCC) 02/02/2019  . Essential hypertension 02/02/2019  . Type 2 diabetes mellitus with complication, without long-term current use of insulin (HCC) 02/02/2019  . Hypokalemia 02/02/2019  . BPH (benign prostatic hyperplasia) 12/26/2014    Hg 7.5 today up from 7.2 yesterday.  Clinically has not rebled.  Continue observation 6 Days Post-Op    LOS: 12 days   Matt B. Daphine Deutscher, MD, [facility] [phone] to reach the surgeon on call.    05/17/2020 8:56 AM

## 2020-05-17 NOTE — Progress Notes (Signed)
   05/17/20 1709  Vitals  Temp 98.3 F (36.8 C)  Temp Source Oral  BP (!) 182/59  MAP (mmHg) 92  BP Location Right Leg  BP Method Automatic  Patient Position (if appropriate) Lying  Pulse Rate 66  Pulse Rate Source Dinamap  Resp 20  Level of Consciousness  Level of Consciousness Alert  MEWS COLOR  MEWS Score Color Green  Oxygen Therapy  SpO2 100 %  O2 Device Room Air  MEWS Score  MEWS Temp 0  MEWS Systolic 0  MEWS Pulse 0  MEWS RR 0  MEWS LOC 0  MEWS Score 0  4 Beat run of vtach reported by tele. Vitals as above. MD notified and new orders for magnesium and potassium given. Pt monitoring to continue

## 2020-05-18 LAB — CBC
HCT: 24.3 % — ABNORMAL LOW (ref 39.0–52.0)
Hemoglobin: 7.8 g/dL — ABNORMAL LOW (ref 13.0–17.0)
MCH: 30 pg (ref 26.0–34.0)
MCHC: 32.1 g/dL (ref 30.0–36.0)
MCV: 93.5 fL (ref 80.0–100.0)
Platelets: 201 10*3/uL (ref 150–400)
RBC: 2.6 MIL/uL — ABNORMAL LOW (ref 4.22–5.81)
RDW: 17.7 % — ABNORMAL HIGH (ref 11.5–15.5)
WBC: 7 10*3/uL (ref 4.0–10.5)
nRBC: 0 % (ref 0.0–0.2)

## 2020-05-18 LAB — HEMOGLOBIN AND HEMATOCRIT, BLOOD
HCT: 26 % — ABNORMAL LOW (ref 39.0–52.0)
Hemoglobin: 8.3 g/dL — ABNORMAL LOW (ref 13.0–17.0)

## 2020-05-18 LAB — BASIC METABOLIC PANEL
Anion gap: 4 — ABNORMAL LOW (ref 5–15)
BUN: 10 mg/dL (ref 8–23)
CO2: 26 mmol/L (ref 22–32)
Calcium: 7.9 mg/dL — ABNORMAL LOW (ref 8.9–10.3)
Chloride: 107 mmol/L (ref 98–111)
Creatinine, Ser: 1.39 mg/dL — ABNORMAL HIGH (ref 0.61–1.24)
GFR, Estimated: 50 mL/min — ABNORMAL LOW (ref >60)
Glucose, Bld: 164 mg/dL — ABNORMAL HIGH (ref 70–99)
Potassium: 3.9 mmol/L (ref 3.5–5.1)
Sodium: 137 mmol/L (ref 135–145)

## 2020-05-18 LAB — GLUCOSE, CAPILLARY
Glucose-Capillary: 121 mg/dL — ABNORMAL HIGH (ref 70–99)
Glucose-Capillary: 126 mg/dL — ABNORMAL HIGH (ref 70–99)
Glucose-Capillary: 217 mg/dL — ABNORMAL HIGH (ref 70–99)

## 2020-05-18 NOTE — Progress Notes (Signed)
PROGRESS NOTE   Thomas Joseph  FHL:[id number] DOB: Dec 10, 1933 DOA: 05/05/2020 PCP: Mila Palmer, MD  Brief Narrative:   History of Present Illness:  87 year old male with past medical history as below, which is significant for chronic kidney disease, diabetes mellitus, hypertension, and anemia.  He was admitted to [facility] after new onset melena on 4/25 and hemoglobin dropped 12.7 and 9.6.  Transfuse 1 unit PRBC at that time.  Gastroenterology was consulted at which point patient underwent EGD with no obvious source of bleeding.  He underwent colonoscopy on 4/27 which was concerning for lower GI bleed.  He had a bleeding scan and IR was consulted demonstrating bleeding in the ascending colon.  He underwent mesenteric angiography and coiling on 5/1.  Over the course of his hospitalization he received 12 units of PRBC. general surgery has been consulted with regards to elective hemicolectomy to me versus total abdominal colectomy and end ileostomy.  Had BMs starting on 5/7 with what appears to be old blood.      Hospital-Problem based course  Severe anemia due to Lower GI bleeding in ascending colon  -s/p Embolization right colic artery 5/1 Dr. Lowella Dandy -s/p 12 U blood transfusions total -Colonoscopy 5/1 showed blood in colon General surgery consulted:  Hemicolectomy versus total colectomy plus and ileostomy if continues to bleed -h/h stable -d/c'd PO Fe and gave IV iron  Paroxysmal A. fib Occurred in the setting of large-volume GI bleed-keep on monitors Is predominantly in sinus rhythm at this time -no blood thinner due to GI bleed  Compensated HFpEF-EF 70 to 75% this admission severe left ventricular concentric hypertrophy   Lower extremity DVT-small pulmonary embolism on imaging Not a candidate for anticoagulation given massive bleed-subrenal artery IVC filter placed 5/1  Hematoma left arm at midline site Extravasation as well as hematoma on left arm--midline  removed  Leukocytosis secondary to stress of GI bleed Etiology unclear--has been on steroids prior to admission-no further work-up  Hypomagnesemia  -repleted  DM TY 2 Continue sliding scale alone  Holding metformin 1000 daily  AKi on CKD stage 2  -trending down, avoid hypotension  Was on steroids for PMR- have stopped  DVT prophylaxis:  SCD  code Status: Full Family Communication: Discussed with patient's wife 5/8 Disposition:  Status is: Inpatient Remains inpatient appropriate because:Ongoing diagnostic testing needed not appropriate for outpatient work up and Unsafe d/c plan  Dispo: The patient is from: Home              Anticipated d/c is to: Home              Patient currently is not medically stable to d/c. 24-48 hours   Difficult to place patient No    Consultants:   Gastroenterology  Interventional radiology  General surgery   Procedures:  Upper endoscopy 4/26 Colonoscopy 4/27 Bleeding scan 4/27 Multiphase CT 4/28 showing no bleeding Mesenteric arteriogram/IVC filter 5/1 1) IVC filter placed below renal veins 2) Active bleeding from right colic artery branch.  Successful coil embolization of feeding branch to bleeding site. 3) Right groin closure with Angioseal device.  Lower extremity duplex RIGHT:  - Findings consistent with age indeterminate deep vein thrombosis  involving the right peroneal veins.     LEFT:  - Findings consistent with age indeterminate deep vein thrombosis  involving the left posterior tibial veins, and left peroneal veins.      Echocardiogram 5/1 =  1. Left ventricular ejection fraction, by estimation, is 70 to 75%.  The  left ventricle has hyperdynamic function. The left ventricle has no  regional wall motion abnormalities. There is severe concentric left  ventricular hypertrophy. Left ventricular  diastolic parameters are consistent with Grade I diastolic dysfunction  (impaired relaxation).  2. Right ventricular  systolic function is normal. The right ventricular  size is normal. There is moderately elevated pulmonary artery systolic  pressure. The estimated right ventricular systolic pressure is 47.4 mmHg.  3. The pericardial effusion is circumferential. There is no evidence of  cardiac tamponade.  4. The mitral valve is normal in structure. No evidence of mitral valve  regurgitation. No evidence of mitral stenosis.  5. Tricuspid valve regurgitation is mild to moderate.  6. The aortic valve is tricuspid. There is mild calcification of the  aortic valve. There is mild thickening of the aortic valve. Aortic valve  regurgitation is not visualized. Mild aortic valve sclerosis is present,  with no evidence of aortic valve  stenosis.  7. The inferior vena cava is normal in size with greater than 50%  respiratory variability, suggesting right atrial pressure of 3 mmHg.   Antimicrobials: None   Subjective: Had 2 BMs yesterday - dark/red blood     Objective: Vitals:   05/17/20 1505 05/17/20 1709 05/17/20 2039 05/18/20 0502  BP: (!) 148/115 (!) 182/59 (!) 163/55 (!) 157/57  Pulse: 72 66 68 61  Resp:  20 17 18   Temp: 98.2 F (36.8 C) 98.3 F (36.8 C) (!) 97.5 F (36.4 C) 98.2 F (36.8 C)  TempSrc: Oral Oral Oral Oral  SpO2: 100% 100% 100% 98%  Weight:      Height:        Intake/Output Summary (Last 24 hours) at 05/18/2020 1048 Last data filed at 05/18/2020 0502 Gross per 24 hour  Intake --  Output 950 ml  Net -950 ml   Filed Weights   05/07/20 0831 05/11/20 0820 05/17/20 0500  Weight: 102 kg 102 kg 110 kg    Examination:  General: Appearance:    Obese male in no acute distress     Lungs:     respirations unlabored  Heart:    Normal heart rate. irr  MS:   All extremities are intact. + edema  Neurologic:   Awake, alert, oriented x 3. No apparent focal neurological           defect.         Data Reviewed: personally reviewed   CBC    Component Value Date/Time   WBC  7.0 05/18/2020 0253   RBC 2.60 (L) 05/18/2020 0253   HGB 7.8 (L) 05/18/2020 0253   HCT 24.3 (L) 05/18/2020 0253   PLT 201 05/18/2020 0253   MCV 93.5 05/18/2020 0253   MCH 30.0 05/18/2020 0253   MCHC 32.1 05/18/2020 0253   RDW 17.7 (H) 05/18/2020 0253   LYMPHSABS 0.8 05/15/2020 0340   MONOABS 0.8 05/15/2020 0340   EOSABS 0.3 05/15/2020 0340   BASOSABS 0.0 05/15/2020 0340   CMP Latest Ref Rng & Units 05/18/2020 05/17/2020 05/16/2020  Glucose 70 - 99 mg/dL 07/16/2020) 778(E) 423(N)  BUN 8 - 23 mg/dL 10 12 14   Creatinine 0.61 - 1.24 mg/dL 361(W) ) 4.31(V)  Sodium 135 - 145 mmol/L 137 136 135  Potassium 3.5 - 5.1 mmol/L 3.9 3.8 3.8  Chloride 98 - 111 mmol/L 107 104 107  CO2 22 - 32 mmol/L 26 27 25   Calcium 8.9 - 10.3 mg/dL 7.9(L) 8.0(L) 7.6(L)  Total Protein 6.5 - 8.1 g/dL - - -  Total Bilirubin 0.3 - 1.2 mg/dL - - -  Alkaline Phos 38 - 126 U/L - - -  AST 15 - 41 U/L - - -  ALT 0 - 44 U/L - - -     Radiology Studies: No results found.   Scheduled Meds: . vitamin C  250 mg Oral TID  . Chlorhexidine Gluconate Cloth  6 each Topical Daily  . feeding supplement  1 Container Oral TID BM  . lisinopril  20 mg Oral Daily   And  . hydrochlorothiazide  12.5 mg Oral Daily  . insulin aspart  0-9 Units Subcutaneous TID WC  . latanoprost  1 drop Both Eyes QHS  . pantoprazole  40 mg Oral BID  . polyethylene glycol  17 g Oral TID   Continuous Infusions: . sodium chloride       LOS: 13 days   Time spent: 20  Joseph Art, DO [facility] To contact the attending provider between 7A-7P or the covering provider during after hours 7P-7A, please log into the web site [email or web address] and access using universal [place] password for that web site. If you do not have the password, please call the hospital operator.  05/18/2020, 10:48 AM

## 2020-05-18 NOTE — Progress Notes (Signed)
Patient ID: Thomas Joseph, male   DOB: 02/05/1933, 87 y.o.   MRN: [id number] [facility] Surgery Progress Note:   7 Days Post-Op  Subjective: Mental status is alert and oriented.  Complaints none;  He commented on having 2 BMs. Objective: Vital signs in last 24 hours: Temp:  [97.5 F (36.4 C)-98.3 F (36.8 C)] 98.2 F (36.8 C) (05/08 0502) Pulse Rate:  [61-72] 61 (05/08 0502) Resp:  [17-20] 18 (05/08 0502) BP: (148-182)/(55-115) 157/57 (05/08 0502) SpO2:  [98 %-100 %] 98 % (05/08 0502)  Intake/Output from previous day: 05/07 0701 - 05/08 0700 In: -  Out: 950 [Urine:950] Intake/Output this shift: No intake/output data recorded.  Physical Exam: Work of breathing is normal at rest.  No abdominal complaints.    Lab Results:  Results for orders placed or performed during the hospital encounter of 05/05/20 (from the past 48 hour(s))  Glucose, capillary     Status: Abnormal   Collection Time: 05/16/20 10:04 AM  Result Value Ref Range   Glucose-Capillary 203 (H) 70 - 99 mg/dL    Comment: Glucose reference range applies only to samples taken after fasting for at least 8 hours.  Glucose, capillary     Status: Abnormal   Collection Time: 05/16/20 11:50 AM  Result Value Ref Range   Glucose-Capillary 193 (H) 70 - 99 mg/dL    Comment: Glucose reference range applies only to samples taken after fasting for at least 8 hours.  Glucose, capillary     Status: None   Collection Time: 05/16/20  5:14 PM  Result Value Ref Range   Glucose-Capillary 92 70 - 99 mg/dL    Comment: Glucose reference range applies only to samples taken after fasting for at least 8 hours.  Glucose, capillary     Status: Abnormal   Collection Time: 05/16/20  9:41 PM  Result Value Ref Range   Glucose-Capillary 190 (H) 70 - 99 mg/dL    Comment: Glucose reference range applies only to samples taken after fasting for at least 8 hours.  CBC     Status: Abnormal   Collection Time: 05/17/20  3:28 AM  Result Value Ref  Range   WBC 7.4 4.0 - 10.5 K/uL   RBC 2.54 (L) 4.22 - 5.81 MIL/uL   Hemoglobin 7.5 (L) 13.0 - 17.0 g/dL   HCT 95.1 (L) 88.4 - 16.6 %   MCV 91.3 80.0 - 100.0 fL   MCH 29.5 26.0 - 34.0 pg   MCHC 32.3 30.0 - 36.0 g/dL   RDW 06.3 (H) 01.6 - 01.0 %   Platelets 175 150 - 400 K/uL   nRBC 0.0 0.0 - 0.2 %    Comment: Performed at [facility], [address], [place], [place] [address]  Basic metabolic panel     Status: Abnormal   Collection Time: 05/17/20  3:28 AM  Result Value Ref Range   Sodium 136 135 - 145 mmol/L   Potassium 3.8 3.5 - 5.1 mmol/L   Chloride 104 98 - 111 mmol/L   CO2 27 22 - 32 mmol/L   Glucose, Bld 165 (H) 70 - 99 mg/dL    Comment: Glucose reference range applies only to samples taken after fasting for at least 8 hours.   BUN 12 8 - 23 mg/dL   Creatinine, Ser 5.73 (H) 0.61 - 1.24 mg/dL   Calcium 8.0 (L) 8.9 - 10.3 mg/dL   GFR, Estimated 53 (L) >60 mL/min    Comment: (NOTE) Calculated using the  CKD-EPI Creatinine Equation (2021)    Anion gap 5 5 - 15    Comment: Performed at [facility], [address] [place], [place] [address]  Iron and TIBC     Status: Abnormal   Collection Time: 05/17/20  3:28 AM  Result Value Ref Range   Iron 29 (L) 45 - 182 ug/dL   TIBC 144 (L) 315 - 400 ug/dL   Saturation Ratios 17 (L) 17.9 - 39.5 %   UIBC 146 ug/dL    Comment: Performed at [facility], [address] [place], [place] [address]  Glucose, capillary     Status: Abnormal   Collection Time: 05/17/20  7:48 AM  Result Value Ref Range   Glucose-Capillary 123 (H) 70 - 99 mg/dL    Comment: Glucose reference range applies only to samples taken after fasting for at least 8 hours.  Magnesium     Status: None   Collection Time: 05/17/20  7:57 AM  Result Value Ref Range   Magnesium 1.9 1.7 - 2.4 mg/dL    Comment: Performed at [facility], [address], [place], [place] [address]  Glucose, capillary     Status: Abnormal   Collection Time: 05/17/20  12:01 PM  Result Value Ref Range   Glucose-Capillary 157 (H) 70 - 99 mg/dL    Comment: Glucose reference range applies only to samples taken after fasting for at least 8 hours.  Glucose, capillary     Status: Abnormal   Collection Time: 05/17/20  5:19 PM  Result Value Ref Range   Glucose-Capillary 243 (H) 70 - 99 mg/dL    Comment: Glucose reference range applies only to samples taken after fasting for at least 8 hours.  Hemoglobin and hematocrit, blood     Status: Abnormal   Collection Time: 05/17/20  7:45 PM  Result Value Ref Range   Hemoglobin 7.9 (L) 13.0 - 17.0 g/dL   HCT 26.7 (L) 12.4 - 58.0 %    Comment: Performed at [facility], [address], [place], [place] [address]  Glucose, capillary     Status: Abnormal   Collection Time: 05/17/20  8:45 PM  Result Value Ref Range   Glucose-Capillary 177 (H) 70 - 99 mg/dL    Comment: Glucose reference range applies only to samples taken after fasting for at least 8 hours.  CBC     Status: Abnormal   Collection Time: 05/18/20  2:53 AM  Result Value Ref Range   WBC 7.0 4.0 - 10.5 K/uL   RBC 2.60 (L) 4.22 - 5.81 MIL/uL   Hemoglobin 7.8 (L) 13.0 - 17.0 g/dL   HCT 82.5 (L) 05.3 - 97.6 %   MCV 93.5 80.0 - 100.0 fL   MCH 30.0 26.0 - 34.0 pg   MCHC 32.1 30.0 - 36.0 g/dL   RDW 73.4 (H) 19.3 - 79.0 %   Platelets 201 150 - 400 K/uL   nRBC 0.0 0.0 - 0.2 %    Comment: Performed at [facility], [address], [place], [place] [address]  Basic metabolic panel     Status: Abnormal   Collection Time: 05/18/20  2:53 AM  Result Value Ref Range   Sodium 137 135 - 145 mmol/L   Potassium 3.9 3.5 - 5.1 mmol/L   Chloride 107 98 - 111 mmol/L   CO2 26 22 - 32 mmol/L   Glucose, Bld 164 (H) 70 - 99 mg/dL    Comment: Glucose  reference range applies only to samples taken after fasting for at least 8 hours.   BUN 10 8 - 23 mg/dL   Creatinine, Ser 7.12 (H) 0.61 - 1.24 mg/dL   Calcium 7.9 (L) 8.9 - 10.3 mg/dL   GFR, Estimated 50 (L) >60  mL/min    Comment: (NOTE) Calculated using the CKD-EPI Creatinine Equation (2021)    Anion gap 4 (L) 5 - 15    Comment: Performed at [facility], [address] [place], [place] [address]  Glucose, capillary     Status: Abnormal   Collection Time: 05/18/20  7:40 AM  Result Value Ref Range   Glucose-Capillary 121 (H) 70 - 99 mg/dL    Comment: Glucose reference range applies only to samples taken after fasting for at least 8 hours.    Radiology/Results: No results found.  Anti-infectives: Anti-infectives (From admission, onward)   None      Assessment/Plan: Problem List: Patient Active Problem List   Diagnosis Date Noted  . Hypomagnesemia 05/11/2020  . Atrial fibrillation with RVR (HCC) 05/11/2020  . GI bleed 05/05/2020  . AKI (acute kidney injury) (HCC)   . 2019 novel coronavirus disease (COVID-19) 03/07/2020  . Anemia 03/07/2020  . Arthropathy 03/07/2020  . Body mass index (BMI) 32.0-32.9, adult 03/07/2020  . Body mass index (BMI) 34.0-34.9, adult 03/07/2020  . Chronic kidney disease (CKD) stage G3a/A1, moderately decreased glomerular filtration rate (GFR) between 45-59 mL/min/1.73 square meter and albuminuria creatinine ratio less than 30 mg/g (HCC) 03/07/2020  . Chronic sinusitis 03/07/2020  . Constipation 03/07/2020  . Diabetic renal disease (HCC) 03/07/2020  . Hardening of the aorta (main artery of the heart) (HCC) 03/07/2020  . Hyperlipidemia 03/07/2020  . Microscopic hematuria 03/07/2020  . Nausea 03/07/2020  . Other abnormalities of gait and mobility 03/07/2020  . Personal history of colonic polyps 03/07/2020  . Pneumonia due to SARS-associated coronavirus 03/07/2020  . Primary open-angle glaucoma, right eye, mild stage 03/07/2020  . Degeneration of lumbar intervertebral disc 03/07/2020  . Radiculopathy due to lumbar intervertebral disc disorder 03/07/2020  . Right lower quadrant pain 03/07/2020  . S/P TURP (status post transurethral resection of  prostate) 03/07/2020  . Secondary hyperparathyroidism of renal origin (HCC) 03/07/2020  . Senile purpura (HCC) 03/07/2020  . Sequelae of other specified infectious and parasitic diseases 03/07/2020  . Stricture of artery (HCC) 03/07/2020  . Subclinical hypothyroidism 03/07/2020  . History of COVID-19 04/11/2019  . Shortness of breath 04/11/2019  . Irregular heart rhythm 04/11/2019  . Fatigue 04/11/2019  . Generalized weakness 04/11/2019  . Acute respiratory failure with hypoxia (HCC) 02/02/2019  . Essential hypertension 02/02/2019  . Type 2 diabetes mellitus with complication, without long-term current use of insulin (HCC) 02/02/2019  . Hypokalemia 02/02/2019  . BPH (benign prostatic hyperplasia) 12/26/2014    Hg 7.8 this morning.  Expect old blood in stool.  Doing well.  Disposition regarding discharge per hospitalist service 7 Days Post-Op    LOS: 13 days   Matt B. Daphine Deutscher, MD, [facility] [phone] to reach the surgeon on call.    05/18/2020 8:42 AM

## 2020-05-18 NOTE — Plan of Care (Signed)

## 2020-05-19 LAB — CBC
HCT: 24.4 % — ABNORMAL LOW (ref 39.0–52.0)
Hemoglobin: 7.7 g/dL — ABNORMAL LOW (ref 13.0–17.0)
MCH: 29.5 pg (ref 26.0–34.0)
MCHC: 31.6 g/dL (ref 30.0–36.0)
MCV: 93.5 fL (ref 80.0–100.0)
Platelets: 223 10*3/uL (ref 150–400)
RBC: 2.61 MIL/uL — ABNORMAL LOW (ref 4.22–5.81)
RDW: 17.3 % — ABNORMAL HIGH (ref 11.5–15.5)
WBC: 7.9 10*3/uL (ref 4.0–10.5)
nRBC: 0 % (ref 0.0–0.2)

## 2020-05-19 LAB — GLUCOSE, CAPILLARY
Glucose-Capillary: 95 mg/dL (ref 70–99)
Glucose-Capillary: 196 mg/dL — ABNORMAL HIGH (ref 70–99)
Glucose-Capillary: 157 mg/dL — ABNORMAL HIGH (ref 70–99)
Glucose-Capillary: 177 mg/dL — ABNORMAL HIGH (ref 70–99)

## 2020-05-19 LAB — BASIC METABOLIC PANEL
Anion gap: 3 — ABNORMAL LOW (ref 5–15)
BUN: 10 mg/dL (ref 8–23)
CO2: 26 mmol/L (ref 22–32)
Calcium: 7.9 mg/dL — ABNORMAL LOW (ref 8.9–10.3)
Chloride: 107 mmol/L (ref 98–111)
Creatinine, Ser: 1.4 mg/dL — ABNORMAL HIGH (ref 0.61–1.24)
GFR, Estimated: 50 mL/min — ABNORMAL LOW (ref >60)
Glucose, Bld: 133 mg/dL — ABNORMAL HIGH (ref 70–99)
Potassium: 3.6 mmol/L (ref 3.5–5.1)
Sodium: 136 mmol/L (ref 135–145)

## 2020-05-19 MED ORDER — HYDRALAZINE HCL 20 MG/ML IJ SOLN: 10.0000 mg | INTRAMUSCULAR | Status: DC | PRN

## 2020-05-19 MED ORDER — LABETALOL HCL 5 MG/ML IV SOLN
10.0000 mg | INTRAVENOUS | Status: DC | PRN
  Administered 2020-05-19: 10 mg via INTRAVENOUS
  Filled 2020-05-19: qty 4

## 2020-05-19 NOTE — Progress Notes (Signed)
[facility] Progress Note  8 Days Post-Op  Subjective: CC-  Family at bedside. Patient reports no abdominal pain, n/v. Tolerating diet. BM yesterday with old dark blood. No bright red blood per rectum. Hgb 7.7 from 8.3 from 7.8. He has not required a blood transfusion since 5/3.  Objective: Vital signs in last 24 hours: Temp:  [97.5 F (36.4 C)-98.4 F (36.9 C)] 98.4 F (36.9 C) (05/09 0611) Pulse Rate:  [63-64] 63 (05/09 0611) Resp:  [16-17] 16 (05/09 0611) BP: (148-156)/(48-55) 156/48 (05/09 0611) SpO2:  [98 %-99 %] 99 % (05/09 0611) Last BM Date: 05/18/20  Intake/Output from previous day: 05/08 0701 - 05/09 0700 In: -  Out: 1550 [Urine:1550] Intake/Output this shift: Total I/O In: 240 [P.O.:240] Out: -   PE: Gen:  Alert, NAD, pleasant HEENT: EOM's intact, pupils equal and round Pulm: rate and effort normal Abd: Soft, NT/ND, +BS, no HSM Skin: no rashes noted, warm and dry  Lab Results:  Recent Labs    05/18/20 0253 05/18/20 1852 05/19/20 0327  WBC 7.0  --  7.9  HGB 7.8* 8.3* 7.7*  HCT 24.3* 26.0* 24.4*  PLT 201  --  223   BMET Recent Labs    05/18/20 0253 05/19/20 0327  NA 137 136  K 3.9 3.6  CL 107 107  CO2 26 26  GLUCOSE 164* 133*  BUN 10 10  CREATININE 1.39* 1.40*  CALCIUM 7.9* 7.9*   PT/INR No results for input(s): LABPROT, INR in the last 72 hours. CMP     Component Value Date/Time   NA 136 05/19/2020 0327   K 3.6 05/19/2020 0327   CL 107 05/19/2020 0327   CO2 26 05/19/2020 0327   GLUCOSE 133 (H) 05/19/2020 0327   BUN 10 05/19/2020 0327   CREATININE 1.40 (H) 05/19/2020 0327   CALCIUM 7.9 (L) 05/19/2020 0327   PROT 3.7 (L) 05/15/2020 0340   ALBUMIN 2.0 (L) 05/15/2020 0340   AST 35 05/15/2020 0340   ALT 17 05/15/2020 0340   ALKPHOS 54 05/15/2020 0340   BILITOT 0.6 05/15/2020 0340   GFRNONAA 50 (L) 05/19/2020 0327   GFRAA >60 02/08/2019 0352   Lipase     Component Value Date/Time   LIPASE 33 12/17/2017 1326        Studies/Results: No results found.  Anti-infectives: Anti-infectives (From admission, onward)   None       Assessment/Plan 2.4 cm subtle hypoattenuating lesion in the uncinate process of the pancreas- may need further workup with MRI as outpatient  Hx hypertension Hxhyperlipidemia Hx of IDDM  PE s/p IVC filter placement on 5/1 by Dr. Lowella Dandy  ABL anemia 2/2 GI bleed - Admitted 4/25 w/new onset melena -EGD on 4/26 that showed medium-sized hiatal herniabutwas negative for bleeding. - Bleeding scan on4/27that showed active bleeding probably from the ascending colon -Colonoscopy on 4/27 showed poor prep with coffee-ground/old blood in colon and no evidence of active bleeding. -CT angio on 4/28 negative for bleeding. -Repeat colonoscopy on 5/1 without revealing source of active bleeding. -Follow-up CTA on 5/1was positive foractivebleeding inthe right colon.  - Patient underwent IRmesenteric arteriogram with coil embolization of right colic artery branchby Dr. Lowella Dandy on 5/1. - Repeat CTA, 5/3,showed no visible active GI bleed-although there reported some higher density material in the colonlimited evaluation.  Plan: Some old blood in stool as expected, but no bright red blood and h/h stable. Tolerating diet and having bowel function. No role for surgical intervention at this time.  We  will sign off, please call with concerns.    LOS: 14 days    Franne Forts, [facility] 05/19/2020, 10:40 AM Please see Amion for pager number during day hours 7:00am-4:30pm

## 2020-05-19 NOTE — Progress Notes (Signed)
Physical Therapy Treatment Patient Details Name: Thomas Joseph MRN: [id number] DOB: [birth year] Today's Date: 05/19/2020    History of Present Illness Pt is an 87 y/o male presenting with new onset of melena.  Pt s/p upper GI 4/26 with no source found.  Colonoscopy 4/27 unrevealing do to poor prep. Bleeding scan showed bleeding in ascending colon. Pt also found to have DVT and PE. On 5/1 pt underwent coil embolization of right colic artery branch and IVC filter placement. PMHx:  CKD, DM, HTN, L TKA    PT Comments    Pt was seen for mobility with wife in attendance, and is making progress with distances and tolerance for standing.  Pt is having some trouble moving but is able to keep going, and cued safety with hand placement and controlling sitting descent.  Follow for acute PT goals with concentration in balance, strength and endurance.  Will instruct pt with wife as she is available to help and observe.   Follow Up Recommendations  Outpatient PT     Equipment Recommendations  Rolling walker with 5" wheels    Recommendations for Other Services       Precautions / Restrictions Precautions Precautions: Fall Precaution Comments: monitor for safety with gait, may need chair to follow for longer trip Restrictions Weight Bearing Restrictions: No    Mobility  Bed Mobility               General bed mobility comments: in chair    Transfers Overall transfer level: Needs assistance Equipment used: Rolling walker (2 wheeled) Transfers: Sit to/from Stand Sit to Stand: Min guard            Ambulation/Gait Ambulation/Gait assistance: Min guard Gait Distance (Feet): 60 Feet Assistive device: Rolling walker (2 wheeled) Gait Pattern/deviations: Step-through pattern;Decreased step length - left;Decreased step length - right;Decreased stride length;Decreased dorsiflexion - right;Decreased dorsiflexion - left   Gait velocity interpretation: <1.8 ft/sec, indicate of risk for  recurrent falls General Gait Details: Elevated walker due to pt being in flexed posture and now is more interested in upright standing   Stairs             Wheelchair Mobility    Modified Rankin (Stroke Patients Only)       Balance Overall balance assessment: Needs assistance Sitting-balance support: Feet supported Sitting balance-Leahy Scale: Good     Standing balance support: Bilateral upper extremity supported;During functional activity Standing balance-Leahy Scale: Poor                              Cognition Arousal/Alertness: Awake/alert Behavior During Therapy: WFL for tasks assessed/performed Overall Cognitive Status: Within Functional Limits for tasks assessed                                 General Comments: agreeable with therapy, wife present      Exercises General Exercises - Lower Extremity Ankle Circles/Pumps: AAROM;5 reps Quad Sets: AROM;5 reps Long Arc Quad: Strengthening;10 reps Hip ABduction/ADduction: 10 reps;Strengthening Hip Flexion/Marching: AROM;10 reps    General Comments General comments (skin integrity, edema, etc.): demonstrating some limited endurance but pt pushes himself to work a little more to walk farther      Pertinent Vitals/Pain Pain Assessment: Faces Faces Pain Scale: No hurt    Home Living  Prior Function            PT Goals (current goals can now be found in the care plan section) Acute Rehab PT Goals Patient Stated Goal: return to independence, work in the yard Progress towards PT goals: Progressing toward goals    Frequency    Min 3X/week      PT Plan Current plan remains appropriate    Co-evaluation              AM-PAC PT "6 Clicks" Mobility   Outcome Measure  Help needed turning from your back to your side while in a flat bed without using bedrails?: None Help needed moving from lying on your back to sitting on the side of a flat bed  without using bedrails?: A Little Help needed moving to and from a bed to a chair (including a wheelchair)?: A Little Help needed standing up from a chair using your arms (e.g., wheelchair or bedside chair)?: A Little Help needed to walk in hospital room?: A Little Help needed climbing 3-5 steps with a railing? : A Lot 6 Click Score: 18    End of Session Equipment Utilized During Treatment: Gait belt Activity Tolerance: Patient tolerated treatment well Patient left: in chair;with call bell/phone within reach;with chair alarm set Nurse Communication: Mobility status PT Visit Diagnosis: Other abnormalities of gait and mobility (R26.89);Difficulty in walking, not elsewhere classified (R26.2)     Time: 1829-9371 PT Time Calculation (min) (ACUTE ONLY): 23 min  Charges:  $Gait Training: 8-22 mins $Therapeutic Exercise: 8-22 mins           Ivar Drape 05/19/2020, 11:30 PM Samul Dada, PT MS Acute Rehab Dept. Number: [facility] [id number] and [facility] [phone]

## 2020-05-19 NOTE — Progress Notes (Signed)
PROGRESS NOTE   Thomas Joseph  OVF:[id number] DOB: August 22, 1933 DOA: 05/05/2020 PCP: Mila Palmer, MD  Brief Narrative:   History of Present Illness:  87 year old male with past medical history as below, which is significant for chronic kidney disease, diabetes mellitus, hypertension, and anemia.  He was admitted to [facility] after new onset melena on 4/25 and hemoglobin dropped 12.7 and 9.6.  Transfuse 1 unit PRBC at that time.  Gastroenterology was consulted at which point patient underwent EGD with no obvious source of bleeding.  He underwent colonoscopy on 4/27 which was concerning for lower GI bleed.  He had a bleeding scan and IR was consulted demonstrating bleeding in the ascending colon.  He underwent mesenteric angiography and coiling on 5/1.  Over the course of his hospitalization he received 12 units of PRBC. general surgery has been consulted with regards to elective hemicolectomy to me versus total abdominal colectomy and end ileostomy.  Had BMs starting on 5/7 with what appears to be old blood.      Hospital-Problem based course  Severe anemia due to Lower GI bleeding in ascending colon  -s/p Embolization right colic artery 5/1 Dr. Lowella Dandy -s/p 12 U blood transfusions total -Colonoscopy 5/1 showed blood in colon General surgery consulted:  Hemicolectomy versus total colectomy plus and ileostomy if continues to bleed -h/h stable -d/c'd PO Fe and gave IV iron  Paroxysmal A. fib Occurred in the setting of large-volume GI bleed-keep on monitors Is predominantly in sinus rhythm at this time -no blood thinner due to GI bleed -echo: 5/1  Compensated HFpEF-EF 70 to 75% this admission severe left ventricular concentric hypertrophy   Lower extremity DVT-small pulmonary embolism on imaging Not a candidate for anticoagulation given massive bleed-subrenal artery IVC filter placed 5/1  Hematoma left arm at midline site Extravasation as well as hematoma on left  arm--midline removed  Leukocytosis secondary to stress of GI bleed Etiology unclear--has been on steroids prior to admission-no further work-up  Hypomagnesemia  -repleted  DM TY 2 Continue sliding scale alone  Holding metformin 1000 daily  AKi on CKD stage 2  -trending down, avoid hypotension  Was on steroids for PMR- have stopped    DVT prophylaxis:  SCD  code Status: Full Family Communication: Discussed with patient's wife 5/9 Disposition:  Status is: Inpatient Remains inpatient appropriate because:Ongoing diagnostic testing needed not appropriate for outpatient work up and Unsafe d/c plan  Dispo: The patient is from: Home              Anticipated d/c is to: Home              Patient currently is not medically stable to d/c. 24 hours   Difficult to place patient No    Consultants:   Gastroenterology  Interventional radiology  General surgery   Procedures:  Upper endoscopy 4/26 Colonoscopy 4/27 Bleeding scan 4/27 Multiphase CT 4/28 showing no bleeding Mesenteric arteriogram/IVC filter 5/1 1) IVC filter placed below renal veins 2) Active bleeding from right colic artery branch.  Successful coil embolization of feeding branch to bleeding site. 3) Right groin closure with Angioseal device.  Lower extremity duplex RIGHT:  - Findings consistent with age indeterminate deep vein thrombosis  involving the right peroneal veins.     LEFT:  - Findings consistent with age indeterminate deep vein thrombosis  involving the left posterior tibial veins, and left peroneal veins.      Echocardiogram 5/1 =  1. Left ventricular ejection fraction, by estimation,  is 70 to 75%. The  left ventricle has hyperdynamic function. The left ventricle has no  regional wall motion abnormalities. There is severe concentric left  ventricular hypertrophy. Left ventricular  diastolic parameters are consistent with Grade I diastolic dysfunction  (impaired relaxation).  2. Right  ventricular systolic function is normal. The right ventricular  size is normal. There is moderately elevated pulmonary artery systolic  pressure. The estimated right ventricular systolic pressure is 47.4 mmHg.  3. The pericardial effusion is circumferential. There is no evidence of  cardiac tamponade.  4. The mitral valve is normal in structure. No evidence of mitral valve  regurgitation. No evidence of mitral stenosis.  5. Tricuspid valve regurgitation is mild to moderate.  6. The aortic valve is tricuspid. There is mild calcification of the  aortic valve. There is mild thickening of the aortic valve. Aortic valve  regurgitation is not visualized. Mild aortic valve sclerosis is present,  with no evidence of aortic valve  stenosis.  7. The inferior vena cava is normal in size with greater than 50%  respiratory variability, suggesting right atrial pressure of 3 mmHg.   Antimicrobials: None   Subjective: Eating well, no BM yet today     Objective: Vitals:   05/18/20 0502 05/18/20 1947 05/19/20 0611 05/19/20 1049  BP: (!) 157/57 (!) 148/55 (!) 156/48 114/68  Pulse: 61 64 63 99  Resp: 18 17 16 17   Temp: 98.2 F (36.8 C) (!) 97.5 F (36.4 C) 98.4 F (36.9 C) 98 F (36.7 C)  TempSrc: Oral Oral Oral   SpO2: 98% 98% 99% 100%  Weight:      Height:        Intake/Output Summary (Last 24 hours) at 05/19/2020 1217 Last data filed at 05/19/2020 07/19/2020 Gross per 24 hour  Intake 240 ml  Output 1550 ml  Net -1310 ml   Filed Weights   05/07/20 0831 05/11/20 0820 05/17/20 0500  Weight: 102 kg 102 kg 110 kg    Examination:  General: Appearance:    Obese male in no acute distress     Lungs:     respirations unlabored  Heart:    Normal heart rate.   MS:   All extremities are intact.   Neurologic:   Awake, alert, oriented x 3.    Data Reviewed: personally reviewed   CBC    Component Value Date/Time   WBC 7.9 05/19/2020 0327   RBC 2.61 (L) 05/19/2020 0327   HGB 7.7 (L)  05/19/2020 0327   HCT 24.4 (L) 05/19/2020 0327   PLT 223 05/19/2020 0327   MCV 93.5 05/19/2020 0327   MCH 29.5 05/19/2020 0327   MCHC 31.6 05/19/2020 0327   RDW 17.3 (H) 05/19/2020 0327   LYMPHSABS 0.8 05/15/2020 0340   MONOABS 0.8 05/15/2020 0340   EOSABS 0.3 05/15/2020 0340   BASOSABS 0.0 05/15/2020 0340   CMP Latest Ref Rng & Units 05/19/2020 05/18/2020 05/17/2020  Glucose 70 - 99 mg/dL 07/17/2020) 073(X) 106(Y)  BUN 8 - 23 mg/dL 10 10 12   Creatinine 0.61 - 1.24 mg/dL 694(W) ) 5.46(E)  Sodium 135 - 145 mmol/L 136 137 136  Potassium 3.5 - 5.1 mmol/L 3.6 3.9 3.8  Chloride 98 - 111 mmol/L 107 107 104  CO2 22 - 32 mmol/L 26 26 27   Calcium 8.9 - 10.3 mg/dL 7.9(L) 7.9(L) 8.0(L)  Total Protein 6.5 - 8.1 g/dL - - -  Total Bilirubin 0.3 - 1.2 mg/dL - - -  Alkaline Phos 38 -  126 U/L - - -  AST 15 - 41 U/L - - -  ALT 0 - 44 U/L - - -     Radiology Studies: No results found.   Scheduled Meds: . vitamin C  250 mg Oral TID  . Chlorhexidine Gluconate Cloth  6 each Topical Daily  . feeding supplement  1 Container Oral TID BM  . lisinopril  20 mg Oral Daily   And  . hydrochlorothiazide  12.5 mg Oral Daily  . insulin aspart  0-9 Units Subcutaneous TID WC  . latanoprost  1 drop Both Eyes QHS  . pantoprazole  40 mg Oral BID  . polyethylene glycol  17 g Oral TID   Continuous Infusions: . sodium chloride       LOS: 14 days   Time spent: 20  Joseph Art, DO [facility] To contact the attending provider between 7A-7P or the covering provider during after hours 7P-7A, please log into the web site [email or web address] and access using universal [place] password for that web site. If you do not have the password, please call the hospital operator.  05/19/2020, 12:17 PM

## 2020-05-20 LAB — CBC
HCT: 24.5 % — ABNORMAL LOW (ref 39.0–52.0)
Hemoglobin: 7.8 g/dL — ABNORMAL LOW (ref 13.0–17.0)
MCH: 29.9 pg (ref 26.0–34.0)
MCHC: 31.8 g/dL (ref 30.0–36.0)
MCV: 93.9 fL (ref 80.0–100.0)
Platelets: 247 10*3/uL (ref 150–400)
RBC: 2.61 MIL/uL — ABNORMAL LOW (ref 4.22–5.81)
RDW: 17.7 % — ABNORMAL HIGH (ref 11.5–15.5)
WBC: 7.4 10*3/uL (ref 4.0–10.5)
nRBC: 0 % (ref 0.0–0.2)

## 2020-05-20 LAB — IRON AND TIBC
Iron: 103 ug/dL (ref 45–182)
Saturation Ratios: 56 % — ABNORMAL HIGH (ref 17.9–39.5)
TIBC: 185 ug/dL — ABNORMAL LOW (ref 250–450)
UIBC: 82 ug/dL

## 2020-05-20 LAB — FERRITIN: Ferritin: 312 ng/mL (ref 24–336)

## 2020-05-20 LAB — GLUCOSE, CAPILLARY
Glucose-Capillary: 110 mg/dL — ABNORMAL HIGH (ref 70–99)
Glucose-Capillary: 218 mg/dL — ABNORMAL HIGH (ref 70–99)
Glucose-Capillary: 184 mg/dL — ABNORMAL HIGH (ref 70–99)

## 2020-05-20 LAB — VITAMIN B12: Vitamin B-12: 228 pg/mL (ref 180–914)

## 2020-05-20 MED ORDER — VITAMIN B12 500 MCG PO TABS: 500.0000 ug | ORAL_TABLET | Freq: Every day | ORAL | Status: DC

## 2020-05-20 MED ORDER — AMLODIPINE BESYLATE 10 MG PO TABS
10.0000 mg | ORAL_TABLET | Freq: Every day | ORAL | Status: DC
  Administered 2020-05-20: 10 mg via ORAL
  Filled 2020-05-20: qty 1

## 2020-05-20 MED ORDER — POLYETHYLENE GLYCOL 3350 17 G PO PACK: 17.0000 g | PACK | Freq: Every day | ORAL | Status: DC

## 2020-05-20 MED ORDER — VITAMIN B-12 1000 MCG PO TABS
1000.0000 ug | ORAL_TABLET | Freq: Every day | ORAL | Status: DC
  Administered 2020-05-20: 1000 ug via ORAL
  Filled 2020-05-20: qty 1

## 2020-05-20 MED ORDER — POLYETHYLENE GLYCOL 3350 17 G PO PACK: 17.0000 g | PACK | Freq: Every day | ORAL | 0 refills | Status: DC

## 2020-05-20 NOTE — Progress Notes (Signed)
Patient given discharge instructions and stated understanding. 

## 2020-05-20 NOTE — Plan of Care (Signed)
  Problem: Education: Goal: Knowledge of General Education information will improve Description: Including pain rating scale, medication(s)/side effects and non-pharmacologic comfort measures Outcome: Adequate for Discharge   Problem: Health Behavior/Discharge Planning: Goal: Ability to manage health-related needs will improve Outcome: Adequate for Discharge   Problem: Clinical Measurements: Goal: Ability to maintain clinical measurements within normal limits will improve Outcome: Adequate for Discharge Goal: Will remain free from infection Outcome: Adequate for Discharge Goal: Diagnostic test results will improve Outcome: Adequate for Discharge Goal: Respiratory complications will improve Outcome: Adequate for Discharge Goal: Cardiovascular complication will be avoided Outcome: Adequate for Discharge   Problem: Activity: Goal: Risk for activity intolerance will decrease Outcome: Adequate for Discharge   Problem: Nutrition: Goal: Adequate nutrition will be maintained Outcome: Adequate for Discharge   Problem: Coping: Goal: Level of anxiety will decrease Outcome: Adequate for Discharge   Problem: Elimination: Goal: Will not experience complications related to bowel motility Outcome: Adequate for Discharge Goal: Will not experience complications related to urinary retention Outcome: Adequate for Discharge   Problem: Pain Managment: Goal: General experience of comfort will improve Outcome: Adequate for Discharge   Problem: Safety: Goal: Ability to remain free from injury will improve Outcome: Adequate for Discharge   Problem: Skin Integrity: Goal: Risk for impaired skin integrity will decrease Outcome: Adequate for Discharge   Problem: Education: Goal: Ability to identify signs and symptoms of gastrointestinal bleeding will improve Outcome: Adequate for Discharge   Problem: Bowel/Gastric: Goal: Will show no signs and symptoms of gastrointestinal bleeding Outcome:  Adequate for Discharge   Problem: Fluid Volume: Goal: Will show no signs and symptoms of excessive bleeding Outcome: Adequate for Discharge   Problem: Clinical Measurements: Goal: Complications related to the disease process, condition or treatment will be avoided or minimized Outcome: Adequate for Discharge   Problem: Acute Rehab PT Goals(only PT should resolve) Goal: Patient Will Transfer Sit To/From Stand Outcome: Adequate for Discharge Goal: Pt Will Transfer Bed To Chair/Chair To Bed Outcome: Adequate for Discharge Goal: Pt Will Ambulate Outcome: Adequate for Discharge Goal: Pt Will Go Up/Down Stairs Outcome: Adequate for Discharge

## 2020-05-20 NOTE — Discharge Summary (Addendum)
Physician Discharge Summary  Thomas Joseph QPY:[id number] DOB: 07-04-1933 DOA: 05/05/2020  PCP: Mila Palmer, MD  Admit date: 05/05/2020 Discharge date: 05/20/2020  Admitted From: home Discharge disposition: home   Recommendations for Outpatient Follow-Up:   1. Cbc 1 week, BMP 1 week 2. Monitor Fe levels and B12 3. Outpatient PT 4. Resume metformin as an outpatient if Cr stable   Discharge Diagnosis:   Active Problems:   Anemia   Constipation   GI bleed   Hypomagnesemia   Atrial fibrillation with RVR (HCC)    Discharge Condition: Improved.  Diet recommendation: low residue/carb mod initially after d/c from hospital  Wound care: None.  Code status: Full.   History of Present Illness:   Thomas Joseph is a 87 y.o. male with medical history significant of HTN, HLD, IIDM, presented with new onset of melena.  Patient has been experiencing "bloating problems" during BM for several days, but yesterday evening he passed a moderate amount of dark-colored stool, feeling dizziness but no loss of consciousness.  This morning he had a large dark-colored bowel movement, and started to feel dizzy again well on toilet bowl, wife helped him to wipe saw dark red color stool as well.  Patient felt lightheaded and blurry vision, denied any chest pain or shortness of breath.  No abdominal pain.  Taking alcohol or NSAIDs on regular basis.   Hospital Course by Problem:    Severe anemia due to Lower GI bleeding in ascending colon             -s/p Embolization right colic artery 5/1 Dr. Lowella Dandy -s/p 12 U blood transfusions total -Colonoscopy 5/1 showed blood in colon General surgery consulted:  Hemicolectomy versus total colectomy plus and ileostomy if continues to bleed -h/h stable -IV Fe -no sign of new bleeding so patient d/c'd home with wife  Paroxysmal A. fib Occurred in the setting of large-volume GI bleed -no further episodes -no blood thinner due to GI  bleed -echo: 5/1  Compensated HFpEF-EF 70 to 75% this admission severe left ventricular concentric hypertrophy  -outpatient follow up   Lower extremity DVT-small pulmonary embolism on imaging Not a candidate for anticoagulation given massive bleed-subrenal artery IVC filter placed 5/1  Hematoma left arm at midline site Extravasation as well as hematoma on left arm--midline removed -improved  Hypomagnesemia             -repleted  DM TY 2 -resume home meds  AKi on CKD stage IIIa             -improved, avoid hypotension    Medical Consultants:   GI IR GS   Discharge Exam:   Vitals:   05/20/20 0539 05/20/20 1300  BP: (!) 170/49 (!) 161/57  Pulse: 63 63  Resp: 16 16  Temp: 97.6 F (36.4 C) 98.6 F (37 C)  SpO2: 100% 100%   Vitals:   05/19/20 2017 05/19/20 2208 05/20/20 0539 05/20/20 1300  BP: (!) 191/59 (!) 151/43 (!) 170/49 (!) 161/57  Pulse: 63 64 63 63  Resp: 17  16 16   Temp: 97.9 F (36.6 C)  97.6 F (36.4 C) 98.6 F (37 C)  TempSrc:    Oral  SpO2: 100%  100% 100%  Weight:   112 kg   Height:        General exam: Appears calm and comfortable.   The results of significant diagnostics from this hospitalization (including imaging, microbiology, ancillary and laboratory) are listed below for  reference.     Procedures and Diagnostic Studies:   US RENAL  Result Date: 05/05/2020 CLINICAL DATA:  Acute renal insufficiency. Baseline stage III A chronic kidney disease. EXAM: RENAL / URINARY TRACT ULTRASOUND COMPLETE COMPARISON:  CT 02/01/2018 FINDINGS: Right Kidney: Renal measurements: 12.4 x 6.0 x 4.2 cm = volume: 163 mL. Echogenicity within normal limits. No mass or hydronephrosis visualized. Simple exophytic cortical cysts are seen within the upper and lower pole measuring up to 7.0 by 6.3 x 6.5 cm within the upper pole. This appears similar to prior examination. Mild perinephric fluid has developed, new since prior examination. Left Kidney: Renal  measurements: 11.2 x 5.4 x 5.1 cm = volume: 160 mL. Echogenicity within normal limits. No mass or hydronephrosis visualized. Mild perinephric fluid has developed, new since prior examination. Bladder: Appears normal for degree of bladder distention. Other: None. IMPRESSION: Interval development of mild bilateral perinephric fluid, possibly inflammatory or related to underlying anasarca. Electronically Signed   By: Helyn Numbers MD   On: 05/05/2020 23:14     Labs:   Basic Metabolic Panel: Recent Labs  Lab 05/15/20 0340 05/16/20 7494 05/17/20 0328 05/17/20 0757 05/18/20 0253 05/19/20 0327  NA 137 135 136  --  137 136  K 3.5 3.8 3.8  --  3.9 3.6  CL 109 107 104  --  107 107  CO2 25 25 27   --  26 26  GLUCOSE 125* 127* 165*  --  164* 133*  BUN 20 14 12   --  10 10  CREATININE 1.36* 1.32* 1.33*  --  1.39* 1.40*  CALCIUM 7.5* 7.6* 8.0*  --  7.9* 7.9*  MG 1.6*  --   --  1.9  --   --    GFR Estimated Creatinine Clearance: 51.5 mL/min (A) (by C-G formula based on SCr of 1.4 mg/dL (H)). Liver Function Tests: Recent Labs  Lab 05/14/20 0252 05/15/20 0340  AST 43* 35  ALT 17 17  ALKPHOS 46 54  BILITOT 0.7 0.6  PROT 3.8* 3.7*  ALBUMIN 2.1* 2.0*   No results for input(s): LIPASE, AMYLASE in the last 168 hours. No results for input(s): AMMONIA in the last 168 hours. Coagulation profile No results for input(s): INR, PROTIME in the last 168 hours.  CBC: Recent Labs  Lab 05/14/20 0252 05/15/20 0340 05/15/20 1559 05/16/20 0311 05/17/20 0328 05/17/20 1945 05/18/20 0253 05/18/20 1852 05/19/20 0327 05/20/20 0320  WBC 10.3 8.6  --  7.6 7.4  --  7.0  --  7.9 7.4  NEUTROABS 8.4* 6.8  --   --   --   --   --   --   --   --   HGB 7.4* 7.0*   < > 7.2* 7.5* 7.9* 7.8* 8.3* 7.7* 7.8*  HCT 22.3* 21.4*   < > 21.7* 23.2* 24.2* 24.3* 26.0* 24.4* 24.5*  MCV 88.5 90.7  --  90.8 91.3  --  93.5  --  93.5 93.9  PLT 109* 131*  --  146* 175  --  201  --  223 247   < > = values in this interval not  displayed.   Cardiac Enzymes: No results for input(s): CKTOTAL, CKMB, CKMBINDEX, TROPONINI in the last 168 hours. BNP: Invalid input(s): POCBNP CBG: Recent Labs  Lab 05/19/20 1202 05/19/20 1714 05/19/20 2051 05/20/20 0753 05/20/20 1151  GLUCAP 196* 157* 177* 110* 218*   D-Dimer No results for input(s): DDIMER in the last 72 hours. Hgb A1c No results for  input(s): HGBA1C in the last 72 hours. Lipid Profile No results for input(s): CHOL, HDL, LDLCALC, TRIG, CHOLHDL, LDLDIRECT in the last 72 hours. Thyroid function studies No results for input(s): TSH, T4TOTAL, T3FREE, THYROIDAB in the last 72 hours.  Invalid input(s): FREET3 Anemia work up Recent Labs    05/20/20 1101  VITAMINB12 228  FERRITIN 312  TIBC 185*  IRON 103   Microbiology No results found for this or any previous visit (from the past 240 hour(s)).   Discharge Instructions:   Discharge Instructions    Diet Carb Modified   Complete by: As directed    Soft/low residue   Increase activity slowly   Complete by: As directed    No wound care   Complete by: As directed      Allergies as of 05/20/2020      Reactions   Hydrochlorothiazide Other (See Comments)   Reaction not recalled, believes it just made him urinate very frequently   Lipitor [atorvastatin] Other (See Comments)   Muscle pain   Tizanidine Hcl Other (See Comments)   Viagra [sildenafil Citrate] Palpitations      Medication List    STOP taking these medications   aspirin 81 MG chewable tablet   hydrocortisone 25 MG suppository Commonly known as: ANUSOL-HC   meloxicam 15 MG tablet Commonly known as: MOBIC   metFORMIN 500 MG 24 hr tablet Commonly known as: GLUCOPHAGE-XR   predniSONE 10 MG tablet Commonly known as: DELTASONE   tadalafil 20 MG tablet Commonly known as: CIALIS     TAKE these medications   acetaminophen 325 MG tablet Commonly known as: TYLENOL Take 325-650 mg by mouth every 12 (twelve) hours as needed for mild  pain or headache.   amLODipine 10 MG tablet Commonly known as: NORVASC Take 10 mg by mouth daily.   Blood Pressure Monitor Devi   cetirizine 10 MG tablet Commonly known as: ZYRTEC Take 10 mg by mouth daily as needed for allergies.   latanoprost 0.005 % ophthalmic solution Commonly known as: XALATAN Place 1 drop into both eyes at bedtime.   lisinopril-hydrochlorothiazide 20-12.5 MG tablet Commonly known as: ZESTORETIC Take 1 tablet by mouth daily.   polyethylene glycol 17 g packet Commonly known as: MIRALAX / GLYCOLAX Take 17 g by mouth daily. Start taking on: May 21, 2020   rosuvastatin 5 MG tablet Commonly known as: CRESTOR Take 5 mg by mouth daily.   Vitamin B12 500 MCG Tabs Take 500 mcg by mouth daily.   Vitamin C 500 MG Caps Take 1 tablet by mouth daily.   zinc sulfate 220 (50 Zn) MG capsule Take 220 mg by mouth daily.       Follow-up Information    [facility] Follow up.   Specialty: Rehabilitation Why: they will call you for apt schedule time if you do not hear from them in 3 days after discharge please feel free to give them a call.  Contact information: [address] [id number] mc [place] [address] [phone]               Time coordinating discharge: 35 min  Signed:  Joseph Art DO  [facility] 05/20/2020, 2:45 PM

## 2020-05-20 NOTE — Progress Notes (Signed)
Spoke with patient's nurse. Instructed to consult when patient is back in bed for PICC line to be pulled. VU Tomasita Morrow, RN VAST

## 2020-05-26 ENCOUNTER — Emergency Department (HOSPITAL_COMMUNITY)
Admission: EM | Admit: 2020-05-26 | Discharge: 2020-05-26 | Disposition: A | Discharge status: Home / Self Care | Payer: Medicare Other | Attending: Emergency Medicine | Admitting: Emergency Medicine

## 2020-05-26 ENCOUNTER — Encounter (HOSPITAL_COMMUNITY): Payer: Self-pay | Admitting: *Deleted

## 2020-05-26 ENCOUNTER — Other Ambulatory Visit: Payer: Self-pay

## 2020-05-26 DIAGNOSIS — Z87891 Personal history of nicotine dependence: Secondary | ICD-10-CM | POA: Diagnosis not present

## 2020-05-26 DIAGNOSIS — Z8616 Personal history of COVID-19: Secondary | ICD-10-CM | POA: Diagnosis not present

## 2020-05-26 DIAGNOSIS — M79602 Pain in left arm: Secondary | ICD-10-CM | POA: Diagnosis not present

## 2020-05-26 DIAGNOSIS — Z79899 Other long term (current) drug therapy: Secondary | ICD-10-CM | POA: Insufficient documentation

## 2020-05-26 DIAGNOSIS — E1122 Type 2 diabetes mellitus with diabetic chronic kidney disease: Secondary | ICD-10-CM | POA: Insufficient documentation

## 2020-05-26 DIAGNOSIS — M7989 Other specified soft tissue disorders: Secondary | ICD-10-CM | POA: Diagnosis not present

## 2020-05-26 DIAGNOSIS — N183 Chronic kidney disease, stage 3 unspecified: Secondary | ICD-10-CM | POA: Insufficient documentation

## 2020-05-26 DIAGNOSIS — I129 Hypertensive chronic kidney disease with stage 1 through stage 4 chronic kidney disease, or unspecified chronic kidney disease: Secondary | ICD-10-CM | POA: Diagnosis not present

## 2020-05-26 LAB — CBC WITH DIFFERENTIAL/PLATELET
Abs Immature Granulocytes: 0.04 10*3/uL (ref 0.00–0.07)
Basophils Absolute: 0.1 10*3/uL (ref 0.0–0.1)
Basophils Relative: 1 %
Eosinophils Absolute: 0.3 10*3/uL (ref 0.0–0.5)
Eosinophils Relative: 4 %
HCT: 32 % — ABNORMAL LOW (ref 39.0–52.0)
Hemoglobin: 9.9 g/dL — ABNORMAL LOW (ref 13.0–17.0)
Immature Granulocytes: 1 %
Lymphocytes Relative: 11 %
Lymphs Abs: 0.9 10*3/uL (ref 0.7–4.0)
MCH: 30 pg (ref 26.0–34.0)
MCHC: 30.9 g/dL (ref 30.0–36.0)
MCV: 97 fL (ref 80.0–100.0)
Monocytes Absolute: 0.8 10*3/uL (ref 0.1–1.0)
Monocytes Relative: 11 %
Neutro Abs: 5.5 10*3/uL (ref 1.7–7.7)
Neutrophils Relative %: 72 %
Platelets: 293 10*3/uL (ref 150–400)
RBC: 3.3 MIL/uL — ABNORMAL LOW (ref 4.22–5.81)
RDW: 17.4 % — ABNORMAL HIGH (ref 11.5–15.5)
WBC: 7.6 10*3/uL (ref 4.0–10.5)
nRBC: 0 % (ref 0.0–0.2)

## 2020-05-26 LAB — COMPREHENSIVE METABOLIC PANEL
ALT: 14 U/L (ref 0–44)
AST: 46 U/L — ABNORMAL HIGH (ref 15–41)
Albumin: 2.8 g/dL — ABNORMAL LOW (ref 3.5–5.0)
Alkaline Phosphatase: 97 U/L (ref 38–126)
Anion gap: 7 (ref 5–15)
BUN: 17 mg/dL (ref 8–23)
CO2: 23 mmol/L (ref 22–32)
Calcium: 8.6 mg/dL — ABNORMAL LOW (ref 8.9–10.3)
Chloride: 106 mmol/L (ref 98–111)
Creatinine, Ser: 1.49 mg/dL — ABNORMAL HIGH (ref 0.61–1.24)
GFR, Estimated: 46 mL/min — ABNORMAL LOW (ref >60)
Glucose, Bld: 112 mg/dL — ABNORMAL HIGH (ref 70–99)
Potassium: 5.6 mmol/L — ABNORMAL HIGH (ref 3.5–5.1)
Sodium: 136 mmol/L (ref 135–145)
Total Bilirubin: 1.4 mg/dL — ABNORMAL HIGH (ref 0.3–1.2)
Total Protein: 5.6 g/dL — ABNORMAL LOW (ref 6.5–8.1)

## 2020-05-26 MED ORDER — ACETAMINOPHEN 500 MG PO TABS
1000.0000 mg | ORAL_TABLET | Freq: Once | ORAL | Status: AC
  Administered 2020-05-26: 1000 mg via ORAL
  Filled 2020-05-26: qty 2

## 2020-05-26 NOTE — ED Notes (Signed)
[place] answered phone and informed RN that vascular tech had left @ 5pm so imaging could not be done today. Provider made aware

## 2020-05-26 NOTE — ED Notes (Signed)
[place] again contacted, no answer.

## 2020-05-26 NOTE — ED Provider Notes (Signed)
[facility] Provider Note   CSN: [id number] Arrival date & time: 05/26/20  1025     History Chief Complaint  Patient presents with  . Arm Swelling    Thomas Joseph is a 87 y.o. male.  HPI  Patient presents with left arm swelling.  He had a complex recent admission with internal hemorrhage requiring intervention.  He also was found to have lower extremity DVTs but was not considered a candidate for anticoagulation due to his hemorrhage.  He also had a blood transfusion that infiltrated into a line in his left arm.  He feels that the discoloration from this has improved but he feels he has some worsening swelling and pain.  His wife also feels the swelling is worsening.  It is not particularly painful right now on my evaluation but has been causing him pain prompting his presentation to the ER today.     Past Medical History:  Diagnosis Date  . Anemia   . BPH (benign prostatic hyperplasia)   . Chronic kidney disease (CKD) stage G3a/A1, moderately decreased glomerular filtration rate (GFR) between 45-59 mL/min/1.73 square meter and albuminuria creatinine ratio less than 30 mg/g (HCC) 03/07/2020  . Diabetes mellitus without complication (HCC)   . ED (erectile dysfunction)   . Family history of adverse reaction to anesthesia    son had some problem years ago, pt unsure of what exact problem was  . Hypertension   . Obesity   . Pneumonia     Patient Active Problem List   Diagnosis Date Noted  . Hypomagnesemia 05/11/2020  . Atrial fibrillation with RVR (HCC) 05/11/2020  . GI bleed 05/05/2020  . AKI (acute kidney injury) (HCC)   . 2019 novel coronavirus disease (COVID-19) 03/07/2020  . Anemia 03/07/2020  . Arthropathy 03/07/2020  . Body mass index (BMI) 32.0-32.9, adult 03/07/2020  . Body mass index (BMI) 34.0-34.9, adult 03/07/2020  . Chronic kidney disease (CKD) stage G3a/A1, moderately decreased glomerular filtration rate (GFR) between  45-59 mL/min/1.73 square meter and albuminuria creatinine ratio less than 30 mg/g (HCC) 03/07/2020  . Chronic sinusitis 03/07/2020  . Constipation 03/07/2020  . Diabetic renal disease (HCC) 03/07/2020  . Hardening of the aorta (main artery of the heart) (HCC) 03/07/2020  . Hyperlipidemia 03/07/2020  . Microscopic hematuria 03/07/2020  . Nausea 03/07/2020  . Other abnormalities of gait and mobility 03/07/2020  . Personal history of colonic polyps 03/07/2020  . Pneumonia due to SARS-associated coronavirus 03/07/2020  . Primary open-angle glaucoma, right eye, mild stage 03/07/2020  . Degeneration of lumbar intervertebral disc 03/07/2020  . Radiculopathy due to lumbar intervertebral disc disorder 03/07/2020  . Right lower quadrant pain 03/07/2020  . S/P TURP (status post transurethral resection of prostate) 03/07/2020  . Secondary hyperparathyroidism of renal origin (HCC) 03/07/2020  . Senile purpura (HCC) 03/07/2020  . Sequelae of other specified infectious and parasitic diseases 03/07/2020  . Stricture of artery (HCC) 03/07/2020  . Subclinical hypothyroidism 03/07/2020  . History of COVID-19 04/11/2019  . Shortness of breath 04/11/2019  . Irregular heart rhythm 04/11/2019  . Fatigue 04/11/2019  . Generalized weakness 04/11/2019  . Acute respiratory failure with hypoxia (HCC) 02/02/2019  . Essential hypertension 02/02/2019  . Type 2 diabetes mellitus with complication, without long-term current use of insulin (HCC) 02/02/2019  . Hypokalemia 02/02/2019  . BPH (benign prostatic hyperplasia) 12/26/2014    Past Surgical History:  Procedure Laterality Date  .  LEFT KNEE ARTHROCOPY    . COLONOSCOPY WITH  PROPOFOL Left 05/07/2020   Procedure: COLONOSCOPY WITH PROPOFOL;  Surgeon: Kathi Der, MD;  Location: [facility] ENDOSCOPY;  Service: Gastroenterology;  Laterality: Left;  . COLONOSCOPY WITH PROPOFOL N/A 05/11/2020   Procedure: COLONOSCOPY WITH PROPOFOL;  Surgeon: Charlott Rakes, MD;   Location: [facility] ENDOSCOPY;  Service: Endoscopy;  Laterality: N/A;  . ESOPHAGOGASTRODUODENOSCOPY N/A 05/06/2020   Procedure: ESOPHAGOGASTRODUODENOSCOPY (EGD);  Surgeon: Willis Modena, MD;  Location: [facility] ENDOSCOPY;  Service: Endoscopy;  Laterality: N/A;  . HERNIA REPAIR    . IR ANGIOGRAM SELECTIVE EACH ADDITIONAL VESSEL  05/11/2020  . IR ANGIOGRAM VISCERAL SELECTIVE  05/11/2020  . IR EMBO ART  VEN HEMORR LYMPH EXTRAV  INC GUIDE ROADMAPPING  05/11/2020  . IR IVC FILTER PLMT / S&I /IMG GUID/MOD SED  05/11/2020  . IR US GUIDE VASC ACCESS RIGHT  05/11/2020  . IR VENOGRAM RENAL BI  05/11/2020  . TRANSURETHRAL RESECTION OF PROSTATE N/A 12/26/2014   Procedure: TRANSURETHRAL RESECTION OF THE PROSTATE;  Surgeon: Malen Gauze, MD;  Location: [facility] ORS;  Service: Urology;  Laterality: N/A;       Family History  Problem Relation Age of Onset  . CVA Father   . Hypertension Father   . Leukemia Sister   . Thyroid disease Sister   . Cancer Brother     Social History   Tobacco Use  . Smoking status: Former Smoker    Quit date: 11/28/1984    Years since quitting: 35.5  . Smokeless tobacco: Never Used  Substance Use Topics  . Alcohol use: Not Currently    Comment: occasional glass of wine  . Drug use: No    Home Medications Prior to Admission medications   Medication Sig Start Date End Date Taking? Authorizing Provider  acetaminophen (TYLENOL) 325 MG tablet Take 325-650 mg by mouth every 12 (twelve) hours as needed for mild pain or headache.    [provider]  amLODipine (NORVASC) 10 MG tablet Take 10 mg by mouth daily.    [provider]  Ascorbic Acid (VITAMIN C) 500 MG CAPS Take 1 tablet by mouth daily. 02/08/19   [provider]  Blood Pressure Monitor DEVI  10/23/19   [provider]  cetirizine (ZYRTEC) 10 MG tablet Take 10 mg by mouth daily as needed for allergies.    [provider]  Cyanocobalamin (VITAMIN B-12) 500 MCG TABS Take 500 mcg by mouth  daily. 05/20/20   Joseph Art, DO  latanoprost (XALATAN) 0.005 % ophthalmic solution Place 1 drop into both eyes at bedtime. 01/19/19   [provider]  lisinopril-hydrochlorothiazide (ZESTORETIC) 20-12.5 MG tablet Take 1 tablet by mouth daily. 02/15/19   [provider]  polyethylene glycol (MIRALAX / GLYCOLAX) 17 g packet Take 17 g by mouth daily. 05/21/20   Joseph Art, DO  rosuvastatin (CRESTOR) 5 MG tablet Take 5 mg by mouth daily. 08/26/19   [provider]  zinc sulfate 220 (50 Zn) MG capsule Take 220 mg by mouth daily. 02/08/19   [provider]  benazepril (LOTENSIN) 40 MG tablet Take 40 mg by mouth daily.  02/02/19  [provider]  zolpidem (AMBIEN) 10 MG tablet Take 10 mg by mouth at bedtime as needed for sleep.   02/02/19  [provider]    Allergies    Hydrochlorothiazide, Lipitor [atorvastatin], Tizanidine hcl, and Viagra [sildenafil citrate]  Review of Systems   Review of Systems  Constitutional: Negative for chills and fever.  HENT: Negative for ear pain and sore  throat.   Eyes: Negative for pain and visual disturbance.  Respiratory: Negative for cough and shortness of breath.   Cardiovascular: Negative for chest pain and palpitations.  Gastrointestinal: Negative for abdominal pain and vomiting.  Genitourinary: Negative for dysuria and hematuria.  Musculoskeletal: Negative for arthralgias and back pain.  Skin: Negative for color change and rash.  Neurological: Negative for seizures and syncope.  All other systems reviewed and are negative.   Physical Exam Updated Vital Signs BP (!) 142/63   Pulse 69   Temp 98.3 F (36.8 C) (Oral)   Resp 16   SpO2 99%   Physical Exam Vitals and nursing note reviewed.  Constitutional:      Appearance: He is well-developed.  HENT:     Head: Normocephalic and atraumatic.  Eyes:     Conjunctiva/sclera: Conjunctivae normal.  Cardiovascular:     Rate and Rhythm: Normal rate  and regular rhythm.     Heart sounds: No murmur heard.     Comments: Left-sided ulnar and radial pulses intact. Pulmonary:     Effort: Pulmonary effort is normal. No respiratory distress.     Breath sounds: Normal breath sounds.  Abdominal:     Palpations: Abdomen is soft.     Tenderness: There is no abdominal tenderness.  Musculoskeletal:        General: Swelling and tenderness present.     Cervical back: Neck supple.     Comments: Bilateral lower extremity edema and swelling.  Skin:    General: Skin is warm and dry.     Comments: Left arm ecchymosis and swelling which generally spares the left AC.  There is some firm tenderness on the medial portion around the [facility] which the wife states is new.  Neurological:     General: No focal deficit present.     Mental Status: He is alert.     Comments: Left hand neurovascular intact; no light touch sensory deficit.  Handgrip is little bit limited by pain but is intact.  Psychiatric:        Behavior: Behavior normal.        Judgment: Judgment normal.     ED Results / Procedures / Treatments   Labs (all labs ordered are listed, but only abnormal results are displayed) Labs Reviewed  CBC WITH DIFFERENTIAL/PLATELET - Abnormal; Notable for the following components:      Result Value   RBC 3.30 (*)    Hemoglobin 9.9 (*)    HCT 32.0 (*)    RDW 17.4 (*)    All other components within normal limits  COMPREHENSIVE METABOLIC PANEL - Abnormal; Notable for the following components:   Potassium 5.6 (*)    Glucose, Bld 112 (*)    Creatinine, Ser 1.49 (*)    Calcium 8.6 (*)    Total Protein 5.6 (*)    Albumin 2.8 (*)    AST 46 (*)    Total Bilirubin 1.4 (*)    GFR, Estimated 46 (*)    All other components within normal limits    EKG None  Radiology No results found.  Procedures Procedures   Medications Ordered in ED Medications  acetaminophen (TYLENOL) tablet 1,000 mg (has no administration in time range)    ED Course  I have  reviewed the triage vital signs and the nursing notes.  Pertinent labs & imaging results that were available during my care of the patient were reviewed by me and considered in my medical decision making (see chart for details).  MDM Rules/Calculators/A&P                          Patient presents with left arm swelling.  They feel it is worsening since his inciting event which was transfusion infiltration.  Arm is neurovascular intact.  DVT or superficial thrombophlebitis are considered given the reported worsening.  Pain is not severe and compartment syndrome is not present.  Laboratory studies obtained and are at baseline including hemoglobin.  DVT ultrasound have been ordered and unfortunately, the ultrasound technicians were not available.  The nurse call multiple times and was finally learned that they were not available for the study.  I disclose this to the patient and his wife.  Ultimately since he is not a candidate for anticoagulation the ultrasound would not change his management and I do not feel it is emergently indicated.  I did discuss with him that the IVC filter that he has would not prevent embolization of a DVT in his left upper extremity but also that there is no good prevention for this at this point.  Discussed symptomatic at home management and return precautions including embolic symptoms.  Patient and his wife understood.  Patient was discharged in stable condition   Final Clinical Impression(s) / ED Diagnoses Final diagnoses:  Left arm swelling    Rx / DC Orders ED Discharge Orders    None       Jacklynn Bue, MD 05/26/20 2159    Blane Ohara, MD 05/28/20 [phone]

## 2020-05-26 NOTE — ED Notes (Signed)
Medications follow up appts reviewed w/ pt. Denies questions or concerns @ this time. Evaluated for L arm swelling. Pt and wife aware outpt [place] follow up. Sling applied to L arm educated on use. Education given on comppression sleeve for L arm. L arm remains bruised wife states is looking better. L radial pulse +2. Education on s/s of worsening and when to return.  Left w/ Rn in wheelchair w/ wife, picked up by son.  aNAD noted.  VSS.

## 2020-05-26 NOTE — Discharge Instructions (Addendum)
You can use ice and consider compression sleeve for your arm.  You can follow-up with your doctor in the future to see if they would like to try to get an ultrasound in the outpatient setting.  Elevation and ice can also help with the pain and swelling in your arm.  You can take 1000 mg of Tylenol every 6 hours as needed for pain.  Please come back for significant chest pain, trouble breathing, or passing out

## 2020-05-26 NOTE — ED Notes (Signed)
Rn attempting to get in touch w. [place] regarding ETA for [place]. No answer.

## 2020-05-26 NOTE — ED Triage Notes (Signed)
Pt recently in hospital and had blood transfusions. Had iv in left arm. Now has swelling, bruising and redness to left arm. Moderate swelling to legs. Airway intact.

## 2020-05-26 NOTE — ED Provider Notes (Addendum)
Emergency Medicine Provider Triage Evaluation Note  Thomas Joseph , a 87 y.o. male  was evaluated in triage.  Pt complains of left arm pain s/p blood transfusion at hospital 1 week ago.According to records has likely a midline placed and removed, he feels the swelling is worse. Today, left arm is swollen and red, there is pain to the area.  Review of Systems  Positive: Arm pain, wound Negative: trauma  Physical Exam  BP (!) 148/66 (BP Location: Right Arm)   Pulse 72   Temp 97.8 F (36.6 C)   Resp 17   SpO2 100%  Gen:   Awake, no distress   Resp:  Normal effort  MSK:   Moves extremities without difficulty except left arm is swollen with ecchymosis and 1+ pitting edema but radial pulses present with good strength.  Other:    Medical Decision Making  Medically screening exam initiated at 12:29 PM.  Appropriate orders placed.  Thomas Joseph was informed that the remainder of the evaluation will be completed by another provider, this initial triage assessment does not replace that evaluation, and the importance of remaining in the ED until their evaluation is complete.  Feels left arm is numb after having blood transfusion about 1 week ago while in the hospital. Due to internal hemorrhage per patient he had to stop his blood thinners. As we were finishing exam he reports leg swelling, chart review notes:   Lower extremity DVT-small pulmonary embolism on imaging Not a candidate for anticoagulation given massive bleed-subrenal artery IVC filter placed 5/1  [place] study ordered to further evaluate arm, along with basic blood work for hgb check.   Thomas Manges, PA-C 05/26/20 1233    Thomas Manges, PA-C 05/26/20 1236    Thomas Loll, MD 05/28/20 2103

## 2020-05-26 NOTE — ED Notes (Signed)
Assuming care. Pt resting in bed @ this time w/ NAD noted. VSS. Cardiac monitoring in place w/ bp cycling and spo2 being monnitored. Updated on plan of care. Deny needs or concerns @ this time. Bed low and locked. 

## 2020-05-26 NOTE — ED Notes (Signed)
ED Provider at bedside updating on ultraound not being performed today.

## 2020-05-28 DIAGNOSIS — I82462 Acute embolism and thrombosis of left calf muscular vein: Secondary | ICD-10-CM | POA: Diagnosis not present

## 2020-05-28 DIAGNOSIS — I480 Paroxysmal atrial fibrillation: Secondary | ICD-10-CM | POA: Diagnosis not present

## 2020-05-28 DIAGNOSIS — R600 Localized edema: Secondary | ICD-10-CM | POA: Diagnosis not present

## 2020-05-28 DIAGNOSIS — E441 Mild protein-calorie malnutrition: Secondary | ICD-10-CM | POA: Diagnosis not present

## 2020-05-28 DIAGNOSIS — I2699 Other pulmonary embolism without acute cor pulmonale: Secondary | ICD-10-CM | POA: Diagnosis not present

## 2020-05-28 DIAGNOSIS — K922 Gastrointestinal hemorrhage, unspecified: Secondary | ICD-10-CM | POA: Diagnosis not present

## 2020-05-28 DIAGNOSIS — D649 Anemia, unspecified: Secondary | ICD-10-CM | POA: Diagnosis not present

## 2020-06-06 ENCOUNTER — Other Ambulatory Visit: Payer: Self-pay

## 2020-06-06 ENCOUNTER — Ambulatory Visit: Payer: Medicare Other | Attending: Family Medicine | Admitting: Physical Therapy

## 2020-06-06 ENCOUNTER — Encounter: Payer: Self-pay | Admitting: Physical Therapy

## 2020-06-06 VITALS — BP 156/66 | HR 60

## 2020-06-06 DIAGNOSIS — R2689 Other abnormalities of gait and mobility: Secondary | ICD-10-CM | POA: Insufficient documentation

## 2020-06-06 DIAGNOSIS — E119 Type 2 diabetes mellitus without complications: Secondary | ICD-10-CM | POA: Diagnosis not present

## 2020-06-06 DIAGNOSIS — M6281 Muscle weakness (generalized): Secondary | ICD-10-CM | POA: Insufficient documentation

## 2020-06-06 NOTE — Patient Instructions (Signed)
Access Code: [email or web address] URL: [email or web address] Date: 06/06/2020 Prepared by: Alphonzo Severance  Exercises Standing March with Counter Support - 1 x daily - 7 x weekly - 3 sets - 10 reps Standing Hip Abduction with Counter Support - 1 x daily - 7 x weekly - 3 sets - 10 reps Standing Hip Extension with Counter Support - 1 x daily - 7 x weekly - 3 sets - 10 reps

## 2020-06-06 NOTE — Therapy (Signed)
[facility] [address] [place], [place], [address] Phone: [phone]   Fax:  [phone]  Physical Therapy Evaluation  Patient Details  Name: Thomas Joseph MRN: [id number] Date of Birth: 1933-02-16 Referring Provider (PT): Rhetta Mura, MD   Encounter Date: 06/06/2020   PT End of Session - 06/06/20 1009    Visit Number 1    Number of Visits 16    Date for PT Re-Evaluation 08/01/20    Authorization Type MCR    Progress Note Due on Visit 10    PT Start Time 0915    PT Stop Time 0950    PT Time Calculation (min) 35 min    Equipment Utilized During Treatment Gait belt    Activity Tolerance Patient tolerated treatment well    Behavior During Therapy [facility] for tasks assessed/performed          Past Medical History:  Diagnosis Date  . Anemia   . BPH (benign prostatic hyperplasia)   . Chronic kidney disease (CKD) stage G3a/A1, moderately decreased glomerular filtration rate (GFR) between 45-59 mL/min/1.73 square meter and albuminuria creatinine ratio less than 30 mg/g (HCC) 03/07/2020  . Diabetes mellitus without complication (HCC)   . ED (erectile dysfunction)   . Family history of adverse reaction to anesthesia    son had some problem years ago, pt unsure of what exact problem was  . Hypertension   . Obesity   . Pneumonia     Past Surgical History:  Procedure Laterality Date  .  LEFT KNEE ARTHROCOPY    . COLONOSCOPY WITH PROPOFOL Left 05/07/2020   Procedure: COLONOSCOPY WITH PROPOFOL;  Surgeon: Kathi Der, MD;  Location: [facility] ENDOSCOPY;  Service: Gastroenterology;  Laterality: Left;  . COLONOSCOPY WITH PROPOFOL N/A 05/11/2020   Procedure: COLONOSCOPY WITH PROPOFOL;  Surgeon: Charlott Rakes, MD;  Location: [facility] ENDOSCOPY;  Service: Endoscopy;  Laterality: N/A;  . ESOPHAGOGASTRODUODENOSCOPY N/A 05/06/2020   Procedure: ESOPHAGOGASTRODUODENOSCOPY (EGD);  Surgeon: Willis Modena, MD;  Location: [facility] ENDOSCOPY;   Service: Endoscopy;  Laterality: N/A;  . HERNIA REPAIR    . IR ANGIOGRAM SELECTIVE EACH ADDITIONAL VESSEL  05/11/2020  . IR ANGIOGRAM VISCERAL SELECTIVE  05/11/2020  . IR EMBO ART  VEN HEMORR LYMPH EXTRAV  INC GUIDE ROADMAPPING  05/11/2020  . IR IVC FILTER PLMT / S&I /IMG GUID/MOD SED  05/11/2020  . IR US GUIDE VASC ACCESS RIGHT  05/11/2020  . IR VENOGRAM RENAL BI  05/11/2020  . TRANSURETHRAL RESECTION OF PROSTATE N/A 12/26/2014   Procedure: TRANSURETHRAL RESECTION OF THE PROSTATE;  Surgeon: Malen Gauze, MD;  Location: [facility] ORS;  Service: Urology;  Laterality: N/A;    Vitals:   06/06/20 0927  BP: (!) 156/66  Pulse: 60  SpO2: 98%      Subjective Assessment - 06/06/20 0920    Subjective Pt is an 87 y/o male presenting who presented to hospital in early may for melena.  Pt s/p upper GI 4/26 with no source found.  Colonoscopy 4/27 unrevealing do to poor prep. Bleeding scan showed bleeding in ascending colon. Pt also found to have DVT and PE. On 5/1 pt underwent coil embolization of right colic artery branch and IVC filter placement. PMHx:  CKD, DM, HTN, L TKA.  He is here today to gain strength after surgery.  No AD prior to surgery.    Pertinent History Hx of DVT, DM, HTN    Limitations Standing;Walking;Lifting;House hold activities    Patient Stated Goals Wants to get stronger  and be able to resume walking up to 3 miles.    Currently in Pain? No/denies              [facility] PT Assessment - 06/06/20 0001      Assessment   Medical Diagnosis Other abnormalities of gait and mobility (R26.89)    Referring Provider (PT) Rhetta Mura, MD    Onset Date/Surgical Date 05/11/20    Hand Dominance Right    Next MD Visit 06/13/85    Prior Therapy not for this issue      Precautions   Precaution Comments FALL RISK      Restrictions   Weight Bearing Restrictions No      Balance Screen   Has the patient fallen in the past 6 months No      Home Environment   Living Environment Private  residence    Living Arrangements Spouse/significant other    Available Help at Discharge Family    Type of Home House    Home Access Stairs to enter    Entrance Stairs-Number of Steps 2    Entrance Stairs-Rails Right    Home Layout One level    Home Equipment Walker - 2 wheels;Cane - single point      Prior Function   Level of Independence Independent    Leisure Prior - walking up to 3 miles/day      Observation/Other Assessments   Observations ecchymosis proximal L UE d/t blood transfusion in hospital, improving; significant edema distal bil LE (followed by PCP)      Sensation   Light Touch Appears Intact      Strength   Overall Strength Comments UE grossly 4/5, LE grossly 3+/5      Transfers   Comments 30'' STS: 6x      Ambulation/Gait   Gait velocity .27 m/s over 30'    Gait Comments slow shuffling gait, holding (but not using) cane      Balance   Balance Assessed --   progressive balance test, unable bilaterally tandem stance (~4 sec)                     Objective measurements completed on examination: See above findings.               PT Education - 06/06/20 0958    Education Details POC, HEP, importance of using AD and working on lifting feet during gait    Person(s) Educated Patient    Methods Explanation;Demonstration;Handout    Comprehension Verbalized understanding;Returned demonstration            PT Short Term Goals - 06/06/20 1006      PT SHORT TERM GOAL #1   Title [name] will be >75% HEP compliant within 3 weeks to facilitate carryover between sessions and move towards eventual independent management of their condition.    Target Date 06/27/20             PT Long Term Goals - 06/06/20 1006      PT LONG TERM GOAL #1   Title [name] will be able to resume walking program up to 1 mile 4x/week    Baseline walking about 5 min    Target Date 08/01/20      PT LONG TERM GOAL #2   Title [name] will improve 30'' STS (MCID 2) to >/= 12x to show improved LE strength and improved transfers.    Baseline 6x  Target Date 08/01/20      PT LONG TERM GOAL #3   Title Almyra BraceRobert L Pounders will be able to stand for >30''' in tandem stance, to show a significant improvement in balance in order to reduce fall risk    Baseline ~4''    Target Date 08/01/20      PT LONG TERM GOAL #4   Title Almyra Braceobert L Rickett will improve gait speed to .6 m/s (.1 m/s MCID) to show functional improvement in ambulation    Baseline .27 over 30'    Target Date 08/01/20                  Plan - 06/06/20 0959    Clinical Impression Statement Almyra BraceRobert L Cauthon is a 87 y.o. male who presents to clinic with signs and sxs consistent with deconditioning following complicated hospital course which included embolization of r colic artery branch, and IVC filter placement following DVT and PE.  Pt presents with notable deficits in: strength, gait, and balance.  Pt is limited functionally in lifting, transfers, squatting, and community ambulation.  Pt will benefit from skilled therapy to address pain and the listed deficits in order to achieve functional goals, enable safety and independence in completion of daily tasks, and return to PLOF.    Personal Factors and Comorbidities Comorbidity 2;Comorbidity 1    Comorbidities DM, HTN    Examination-Activity Limitations Lift;Dressing;Carry;Squat;Stairs;Transfers    Examination-Participation Restrictions Other;Shop;Community Activity    Stability/Clinical Decision Making Stable/Uncomplicated    Clinical Decision Making Low    Rehab Potential Good    PT Frequency 2x / week    PT Duration 8 weeks    PT Treatment/Interventions ADLs/Self Care Home Management;Aquatic Therapy;Therapeutic exercise;Neuromuscular re-education;Gait training;Therapeutic activities;Manual techniques    PT Next Visit Plan work on LE strength, gait, balance    PT Home Exercise Plan [id number]     Recommended Other Services none    Consulted and Agree with Plan of Care Patient           Patient will benefit from skilled therapeutic intervention in order to improve the following deficits and impairments:  Abnormal gait,Decreased endurance,Decreased activity tolerance,Decreased strength,Impaired UE functional use,Difficulty walking,Decreased balance  Visit Diagnosis: Muscle weakness  Other abnormalities of gait and mobility  Balance problem     Problem List Patient Active Problem List   Diagnosis Date Noted  . Hypomagnesemia 05/11/2020  . Atrial fibrillation with RVR (HCC) 05/11/2020  . GI bleed 05/05/2020  . AKI (acute kidney injury) (HCC)   . 2019 novel coronavirus disease (COVID-19) 03/07/2020  . Anemia 03/07/2020  . Arthropathy 03/07/2020  . Body mass index (BMI) 32.0-32.9, adult 03/07/2020  . Body mass index (BMI) 34.0-34.9, adult 03/07/2020  . Chronic kidney disease (CKD) stage G3a/A1, moderately decreased glomerular filtration rate (GFR) between 45-59 mL/min/1.73 square meter and albuminuria creatinine ratio less than 30 mg/g (HCC) 03/07/2020  . Chronic sinusitis 03/07/2020  . Constipation 03/07/2020  . Diabetic renal disease (HCC) 03/07/2020  . Hardening of the aorta (main artery of the heart) (HCC) 03/07/2020  . Hyperlipidemia 03/07/2020  . Microscopic hematuria 03/07/2020  . Nausea 03/07/2020  . Other abnormalities of gait and mobility 03/07/2020  . Personal history of colonic polyps 03/07/2020  . Pneumonia due to SARS-associated coronavirus 03/07/2020  . Primary open-angle glaucoma, right eye, mild stage 03/07/2020  . Degeneration of lumbar intervertebral disc 03/07/2020  . Radiculopathy due to lumbar intervertebral disc disorder 03/07/2020  . Right lower quadrant pain 03/07/2020  . S/P  TURP (status post transurethral resection of prostate) 03/07/2020  . Secondary hyperparathyroidism of renal origin (HCC) 03/07/2020  . Senile purpura (HCC) 03/07/2020   . Sequelae of other specified infectious and parasitic diseases 03/07/2020  . Stricture of artery (HCC) 03/07/2020  . Subclinical hypothyroidism 03/07/2020  . History of COVID-19 04/11/2019  . Shortness of breath 04/11/2019  . Irregular heart rhythm 04/11/2019  . Fatigue 04/11/2019  . Generalized weakness 04/11/2019  . Acute respiratory failure with hypoxia (HCC) 02/02/2019  . Essential hypertension 02/02/2019  . Type 2 diabetes mellitus with complication, without long-term current use of insulin (HCC) 02/02/2019  . Hypokalemia 02/02/2019  . BPH (benign prostatic hyperplasia) 12/26/2014    Alphonzo Severance PT, DPT 06/06/20 10:11 AM  [facility] [address] [place], [place], [address] Phone: [phone]   Fax:  [phone]  Name: SANTIAGO STENZEL MRN: [id number] Date of Birth: [birth year]

## 2020-06-13 ENCOUNTER — Ambulatory Visit: Payer: Medicare Other | Admitting: Podiatry

## 2020-06-16 ENCOUNTER — Ambulatory Visit: Payer: Medicare Other | Admitting: Podiatry

## 2020-06-18 DIAGNOSIS — E119 Type 2 diabetes mellitus without complications: Secondary | ICD-10-CM | POA: Diagnosis not present

## 2020-06-18 DIAGNOSIS — H40053 Ocular hypertension, bilateral: Secondary | ICD-10-CM | POA: Diagnosis not present

## 2020-06-18 DIAGNOSIS — H11133 Conjunctival pigmentations, bilateral: Secondary | ICD-10-CM | POA: Diagnosis not present

## 2020-06-18 DIAGNOSIS — H401111 Primary open-angle glaucoma, right eye, mild stage: Secondary | ICD-10-CM | POA: Diagnosis not present

## 2020-06-18 DIAGNOSIS — H2513 Age-related nuclear cataract, bilateral: Secondary | ICD-10-CM | POA: Diagnosis not present

## 2020-06-18 DIAGNOSIS — H31092 Other chorioretinal scars, left eye: Secondary | ICD-10-CM | POA: Diagnosis not present

## 2020-06-18 DIAGNOSIS — H02831 Dermatochalasis of right upper eyelid: Secondary | ICD-10-CM | POA: Diagnosis not present

## 2020-06-18 DIAGNOSIS — H02834 Dermatochalasis of left upper eyelid: Secondary | ICD-10-CM | POA: Diagnosis not present

## 2020-06-18 DIAGNOSIS — H11823 Conjunctivochalasis, bilateral: Secondary | ICD-10-CM | POA: Diagnosis not present

## 2020-06-25 ENCOUNTER — Other Ambulatory Visit: Payer: Self-pay

## 2020-06-25 ENCOUNTER — Encounter: Payer: Self-pay | Admitting: Physical Therapy

## 2020-06-25 ENCOUNTER — Ambulatory Visit: Payer: Medicare Other | Attending: Family Medicine | Admitting: Physical Therapy

## 2020-06-25 DIAGNOSIS — R2689 Other abnormalities of gait and mobility: Secondary | ICD-10-CM | POA: Diagnosis not present

## 2020-06-25 DIAGNOSIS — R262 Difficulty in walking, not elsewhere classified: Secondary | ICD-10-CM | POA: Insufficient documentation

## 2020-06-25 DIAGNOSIS — Z8616 Personal history of COVID-19: Secondary | ICD-10-CM | POA: Diagnosis not present

## 2020-06-25 DIAGNOSIS — M6281 Muscle weakness (generalized): Secondary | ICD-10-CM | POA: Diagnosis not present

## 2020-06-25 NOTE — Therapy (Signed)
[facility] [address] [place], [place], [address] Phone: [phone]   Fax:  [phone]  Physical Therapy Treatment  Patient Details  Name: DIN BOOKWALTER MRN: [id number] Date of Birth: [birth year] Referring Provider (PT): Rhetta Mura, MD   Encounter Date: 06/25/2020   PT End of Session - 06/25/20 1052     Number of Visits 16    Date for PT Re-Evaluation 08/01/20    Authorization Type MCR    Progress Note Due on Visit 10    PT Start Time 1045    PT Stop Time 1128    PT Time Calculation (min) 43 min    Equipment Utilized During Treatment Gait belt    Activity Tolerance Patient tolerated treatment well    Behavior During Therapy WFL for tasks assessed/performed             Past Medical History:  Diagnosis Date   Anemia    BPH (benign prostatic hyperplasia)    Chronic kidney disease (CKD) stage G3a/A1, moderately decreased glomerular filtration rate (GFR) between 45-59 mL/min/1.73 square meter and albuminuria creatinine ratio less than 30 mg/g (HCC) 03/07/2020   Diabetes mellitus without complication [facility])    ED (erectile dysfunction)    Family history of adverse reaction to anesthesia    son had some problem years ago, pt unsure of what exact problem was   Hypertension    Obesity    Pneumonia     Past Surgical History:  Procedure Laterality Date    LEFT KNEE ARTHROCOPY     COLONOSCOPY WITH PROPOFOL Left 05/07/2020   Procedure: COLONOSCOPY WITH PROPOFOL;  Surgeon: Kathi Der, MD;  Location: [facility] ENDOSCOPY;  Service: Gastroenterology;  Laterality: Left;   COLONOSCOPY WITH PROPOFOL N/A 05/11/2020   Procedure: COLONOSCOPY WITH PROPOFOL;  Surgeon: Charlott Rakes, MD;  Location: [facility] ENDOSCOPY;  Service: Endoscopy;  Laterality: N/A;   ESOPHAGOGASTRODUODENOSCOPY N/A 05/06/2020   Procedure: ESOPHAGOGASTRODUODENOSCOPY (EGD);  Surgeon: Willis Modena, MD;  Location: [facility] ENDOSCOPY;  Service: Endoscopy;   Laterality: N/A;   HERNIA REPAIR     IR ANGIOGRAM SELECTIVE EACH ADDITIONAL VESSEL  05/11/2020   IR ANGIOGRAM VISCERAL SELECTIVE  05/11/2020   IR EMBO ART  VEN HEMORR LYMPH EXTRAV  INC GUIDE ROADMAPPING  05/11/2020   IR IVC FILTER PLMT / S&I /IMG GUID/MOD SED  05/11/2020   IR US GUIDE VASC ACCESS RIGHT  05/11/2020   IR VENOGRAM RENAL BI  05/11/2020   TRANSURETHRAL RESECTION OF PROSTATE N/A 12/26/2014   Procedure: TRANSURETHRAL RESECTION OF THE PROSTATE;  Surgeon: Malen Gauze, MD;  Location: [facility] ORS;  Service: Urology;  Laterality: N/A;    There were no vitals filed for this visit.   Subjective Assessment - 06/25/20 1052     Subjective Pt reports that he has been doing well, feeling stronger, and completing his HEP, he denies falls.    Pertinent History Hx of DVT, DM, HTN:    Limitations Standing;Walking;Lifting;House hold activities    Patient Stated Goals Wants to get stronger and be able to resume walking up to 3 miles.    Currently in Pain? No/denies                               [facility] PT Treatment/Exercise - 06/25/20 0001       High Level Balance   High Level Balance Comments tandem stance in // 30'' bouts      Exercises  Exercises Lumbar;Knee/Hip;Ankle      Lumbar Exercises: Aerobic   Nustep L5 6'      Lumbar Exercises: Seated   Sit to Stand Limitations 5x UE support and airex, 4x5 airex no UE      Knee/Hip Exercises: Standing   Forward Step Up Limitations 5'' step - 2x10 ea    Other Standing Knee Exercises walking march in // bil UE -> 1x UE - >                      PT Short Term Goals - 06/06/20 1006       PT SHORT TERM GOAL #1   Title [name] will be >75% HEP compliant within 3 weeks to facilitate carryover between sessions and move towards eventual independent management of their condition.    Target Date 06/27/20               PT Long Term Goals - 06/06/20 1006       PT LONG TERM GOAL #1   Title [name] will be able to resume walking program up to 1 mile 4x/week    Baseline walking about 5 min    Target Date 08/01/20      PT LONG TERM GOAL #2   Title [name] will improve 30'' STS (MCID 2) to >/= 12x to show improved LE strength and improved transfers.    Baseline 6x    Target Date 08/01/20      PT LONG TERM GOAL #3   Title SHIVAAN TIERNO will be able to stand for >30''' in tandem stance, to show a significant improvement in balance in order to reduce fall risk    Baseline ~4''    Target Date 08/01/20      PT LONG TERM GOAL #4   Title [name] will improve gait speed to .6 m/s (.1 m/s MCID) to show functional improvement in ambulation    Baseline .27 over 30'    Target Date 08/01/20                   Plan - 06/25/20 1105     Clinical Impression Statement Pt progressing well with therapy.  today we concentrated on improving LE strength to facilitate increase step height to reduce fall risk.  Pt fatigues quickly and defaults to shuffling gait but shows considerable improvement with cuing.  Will continue to progress LE strength, gait, and balance as able.    Personal Factors and Comorbidities Comorbidity 2;Comorbidity 1    Comorbidities DM, HTN    Examination-Activity Limitations Lift;Dressing;Carry;Squat;Stairs;Transfers    Examination-Participation Restrictions Other;Shop;Community Activity    Stability/Clinical Decision Making Stable/Uncomplicated    Rehab Potential Good    PT Frequency 2x / week    PT Duration 8 weeks    PT Treatment/Interventions ADLs/Self Care Home Management;Aquatic Therapy;Therapeutic exercise;Neuromuscular re-education;Gait training;Therapeutic activities;Manual techniques    PT Next Visit Plan work on LE strength, gait, balance    PT Home Exercise Plan [id number]    Consulted and Agree with Plan of Care Patient             Patient will benefit from skilled therapeutic intervention in order to improve the  following deficits and impairments:  Abnormal gait, Decreased endurance, Decreased activity tolerance, Decreased strength, Impaired UE functional use, Difficulty walking, Decreased balance  Visit Diagnosis: Muscle weakness  Other abnormalities of gait and mobility  Balance problem  Problem List Patient Active Problem List   Diagnosis Date Noted   Hypomagnesemia 05/11/2020   Atrial fibrillation with RVR (HCC) 05/11/2020   GI bleed 05/05/2020   AKI (acute kidney injury) (HCC)    2019 novel coronavirus disease (COVID-19) 03/07/2020   Anemia 03/07/2020   Arthropathy 03/07/2020   Body mass index (BMI) 32.0-32.9, adult 03/07/2020   Body mass index (BMI) 34.0-34.9, adult 03/07/2020   Chronic kidney disease (CKD) stage G3a/A1, moderately decreased glomerular filtration rate (GFR) between 45-59 mL/min/1.73 square meter and albuminuria creatinine ratio less than 30 mg/g (HCC) 03/07/2020   Chronic sinusitis 03/07/2020   Constipation 03/07/2020   Diabetic renal disease (HCC) 03/07/2020   Hardening of the aorta (main artery of the heart) (HCC) 03/07/2020   Hyperlipidemia 03/07/2020   Microscopic hematuria 03/07/2020   Nausea 03/07/2020   Other abnormalities of gait and mobility 03/07/2020   Personal history of colonic polyps 03/07/2020   Pneumonia due to SARS-associated coronavirus 03/07/2020   Primary open-angle glaucoma, right eye, mild stage 03/07/2020   Degeneration of lumbar intervertebral disc 03/07/2020   Radiculopathy due to lumbar intervertebral disc disorder 03/07/2020   Right lower quadrant pain 03/07/2020   S/P TURP (status post transurethral resection of prostate) 03/07/2020   Secondary hyperparathyroidism of renal origin (HCC) 03/07/2020   Senile purpura (HCC) 03/07/2020   Sequelae of other specified infectious and parasitic diseases 03/07/2020   Stricture of artery (HCC) 03/07/2020   Subclinical hypothyroidism 03/07/2020   History of COVID-19 04/11/2019   Shortness  of breath 04/11/2019   Irregular heart rhythm 04/11/2019   Fatigue 04/11/2019   Generalized weakness 04/11/2019   Acute respiratory failure with hypoxia (HCC) 02/02/2019   Essential hypertension 02/02/2019   Type 2 diabetes mellitus with complication, without long-term current use of insulin (HCC) 02/02/2019   Hypokalemia 02/02/2019   BPH (benign prostatic hyperplasia) 12/26/2014    Alphonzo Severance PT, DPT 06/25/20 11:35 AM  [facility] [address] [place], [place], [address] Phone: [phone]   Fax:  [phone]  Name: JAREL CUADRA MRN: [id number] Date of Birth: 18-May-1933

## 2020-06-27 DIAGNOSIS — R5381 Other malaise: Secondary | ICD-10-CM | POA: Diagnosis not present

## 2020-06-27 DIAGNOSIS — N390 Urinary tract infection, site not specified: Secondary | ICD-10-CM | POA: Diagnosis not present

## 2020-06-27 DIAGNOSIS — D649 Anemia, unspecified: Secondary | ICD-10-CM | POA: Diagnosis not present

## 2020-06-27 DIAGNOSIS — Z8719 Personal history of other diseases of the digestive system: Secondary | ICD-10-CM | POA: Diagnosis not present

## 2020-06-27 DIAGNOSIS — R3911 Hesitancy of micturition: Secondary | ICD-10-CM | POA: Diagnosis not present

## 2020-06-27 DIAGNOSIS — E441 Mild protein-calorie malnutrition: Secondary | ICD-10-CM | POA: Diagnosis not present

## 2020-06-27 DIAGNOSIS — I10 Essential (primary) hypertension: Secondary | ICD-10-CM | POA: Diagnosis not present

## 2020-06-30 ENCOUNTER — Ambulatory Visit: Payer: Medicare Other

## 2020-06-30 ENCOUNTER — Other Ambulatory Visit: Payer: Self-pay

## 2020-06-30 DIAGNOSIS — R2689 Other abnormalities of gait and mobility: Secondary | ICD-10-CM | POA: Diagnosis not present

## 2020-06-30 DIAGNOSIS — R262 Difficulty in walking, not elsewhere classified: Secondary | ICD-10-CM | POA: Diagnosis not present

## 2020-06-30 DIAGNOSIS — Z8616 Personal history of COVID-19: Secondary | ICD-10-CM | POA: Diagnosis not present

## 2020-06-30 DIAGNOSIS — M6281 Muscle weakness (generalized): Secondary | ICD-10-CM | POA: Diagnosis not present

## 2020-06-30 NOTE — Therapy (Signed)
[facility] Outpatient Rehabilitation [facility] [address] [place], [place], [address] Phone: [phone]   Fax:  [phone]  Physical Therapy Treatment  Patient Details  Name: Thomas Joseph MRN: [id number] Date of Birth: 1933-03-10 Referring Provider (PT): Rhetta Mura, MD   Encounter Date: 06/30/2020   PT End of Session - 06/30/20 0908     Visit Number 3    Number of Visits 16    Date for PT Re-Evaluation 08/01/20    Authorization Type MCR    Progress Note Due on Visit 10    PT Start Time 0910    PT Stop Time 0952    PT Time Calculation (min) 42 min    Equipment Utilized During Treatment Gait belt    Activity Tolerance Patient tolerated treatment well    Behavior During Therapy WFL for tasks assessed/performed             Past Medical History:  Diagnosis Date   Anemia    BPH (benign prostatic hyperplasia)    Chronic kidney disease (CKD) stage G3a/A1, moderately decreased glomerular filtration rate (GFR) between 45-59 mL/min/1.73 square meter and albuminuria creatinine ratio less than 30 mg/g (HCC) 03/07/2020   Diabetes mellitus without complication [facility])    ED (erectile dysfunction)    Family history of adverse reaction to anesthesia    son had some problem years ago, pt unsure of what exact problem was   Hypertension    Obesity    Pneumonia     Past Surgical History:  Procedure Laterality Date    LEFT KNEE ARTHROCOPY     COLONOSCOPY WITH PROPOFOL Left 05/07/2020   Procedure: COLONOSCOPY WITH PROPOFOL;  Surgeon: Kathi Der, MD;  Location: [facility] ENDOSCOPY;  Service: Gastroenterology;  Laterality: Left;   COLONOSCOPY WITH PROPOFOL N/A 05/11/2020   Procedure: COLONOSCOPY WITH PROPOFOL;  Surgeon: Charlott Rakes, MD;  Location: [facility] ENDOSCOPY;  Service: Endoscopy;  Laterality: N/A;   ESOPHAGOGASTRODUODENOSCOPY N/A 05/06/2020   Procedure: ESOPHAGOGASTRODUODENOSCOPY (EGD);  Surgeon: Willis Modena, MD;  Location: [facility] ENDOSCOPY;   Service: Endoscopy;  Laterality: N/A;   HERNIA REPAIR     IR ANGIOGRAM SELECTIVE EACH ADDITIONAL VESSEL  05/11/2020   IR ANGIOGRAM VISCERAL SELECTIVE  05/11/2020   IR EMBO ART  VEN HEMORR LYMPH EXTRAV  INC GUIDE ROADMAPPING  05/11/2020   IR IVC FILTER PLMT / S&I /IMG GUID/MOD SED  05/11/2020   IR US GUIDE VASC ACCESS RIGHT  05/11/2020   IR VENOGRAM RENAL BI  05/11/2020   TRANSURETHRAL RESECTION OF PROSTATE N/A 12/26/2014   Procedure: TRANSURETHRAL RESECTION OF THE PROSTATE;  Surgeon: Malen Gauze, MD;  Location: [facility] ORS;  Service: Urology;  Laterality: N/A;    There were no vitals filed for this visit.   Subjective Assessment - 06/30/20 0908     Subjective Pt presents to PT and reports that he is feeling pretty well. He has been compliant with his HEP with no adverse effect. Pt is ready to begin PT treatment at this time.    Currently in Pain? No/denies                               [facility] Adult PT Treatment/Exercise - 06/30/20 0001       Knee/Hip Exercises: Aerobic   Nustep L5 UE/LE x 5 min while taking subjective      Knee/Hip Exercises: Standing   Heel Raises 20 reps    Heel Raises Limitations 1 UE  hold    Hip Abduction 2 sets;10 reps    Abduction Limitations 2lbs    Hip Extension 2 sets;10 reps    Extension Limitations 2lbs    Forward Step Up 2 sets;10 reps;Hand Hold: 2;Step Height: 6"    Other Standing Knee Exercises walking march in // 1x UE x 2      Knee/Hip Exercises: Seated   Sit to Sand 2 sets;10 reps;with UE support   1 UE support; on airex                     PT Short Term Goals - 06/06/20 1006       PT SHORT TERM GOAL #1   Title Thomas Joseph will be >75% HEP compliant within 3 weeks to facilitate carryover between sessions and move towards eventual independent management of their condition.    Target Date 06/27/20               PT Long Term Goals - 06/06/20 1006       PT LONG TERM GOAL #1   Title Thomas Joseph  will be able to resume walking program up to 1 mile 4x/week    Baseline walking about 5 min    Target Date 08/01/20      PT LONG TERM GOAL #2   Title Thomas Joseph will improve 30'' STS (MCID 2) to >/= 12x to show improved LE strength and improved transfers.    Baseline 6x    Target Date 08/01/20      PT LONG TERM GOAL #3   Title Thomas Joseph will be able to stand for >30''' in tandem stance, to show a significant improvement in balance in order to reduce fall risk    Baseline ~4''    Target Date 08/01/20      PT LONG TERM GOAL #4   Title Thomas Joseph will improve gait speed to .6 m/s (.1 m/s MCID) to show functional improvement in ambulation    Baseline .27 over 30'    Target Date 08/01/20                   Plan - 06/30/20 0955     Clinical Impression Statement Pt was able to complete all prescribed exercises with no adverse effect. He continues to show improving LE strength and increased activity tolerance, but does continue to fatigue quickly which increases tendancy for shuffling gait pattern. Pt continues to benefit from skilled PT services and will continue to be seen and progressed as tolerated.    PT Treatment/Interventions ADLs/Self Care Home Management;Aquatic Therapy;Therapeutic exercise;Neuromuscular re-education;Gait training;Therapeutic activities;Manual techniques    PT Next Visit Plan work on LE strength, gait, balance    PT Home Exercise Plan [email or web address]             Patient will benefit from skilled therapeutic intervention in order to improve the following deficits and impairments:  Abnormal gait, Decreased endurance, Decreased activity tolerance, Decreased strength, Impaired UE functional use, Difficulty walking, Decreased balance  Visit Diagnosis: Muscle weakness  Other abnormalities of gait and mobility  Balance problem     Problem List Patient Active Problem List   Diagnosis Date Noted   Hypomagnesemia 05/11/2020   Atrial  fibrillation with RVR (HCC) 05/11/2020   GI bleed 05/05/2020   AKI (acute kidney injury) (HCC)    2019 novel coronavirus disease (COVID-19) 03/07/2020   Anemia 03/07/2020   Arthropathy 03/07/2020   Body mass index (  BMI) 32.0-32.9, adult 03/07/2020   Body mass index (BMI) 34.0-34.9, adult 03/07/2020   Chronic kidney disease (CKD) stage G3a/A1, moderately decreased glomerular filtration rate (GFR) between 45-59 mL/min/1.73 square meter and albuminuria creatinine ratio less than 30 mg/g (HCC) 03/07/2020   Chronic sinusitis 03/07/2020   Constipation 03/07/2020   Diabetic renal disease (HCC) 03/07/2020   Hardening of the aorta (main artery of the heart) (HCC) 03/07/2020   Hyperlipidemia 03/07/2020   Microscopic hematuria 03/07/2020   Nausea 03/07/2020   Other abnormalities of gait and mobility 03/07/2020   Personal history of colonic polyps 03/07/2020   Pneumonia due to SARS-associated coronavirus 03/07/2020   Primary open-angle glaucoma, right eye, mild stage 03/07/2020   Degeneration of lumbar intervertebral disc 03/07/2020   Radiculopathy due to lumbar intervertebral disc disorder 03/07/2020   Right lower quadrant pain 03/07/2020   S/P TURP (status post transurethral resection of prostate) 03/07/2020   Secondary hyperparathyroidism of renal origin (HCC) 03/07/2020   Senile purpura (HCC) 03/07/2020   Sequelae of other specified infectious and parasitic diseases 03/07/2020   Stricture of artery (HCC) 03/07/2020   Subclinical hypothyroidism 03/07/2020   History of COVID-19 04/11/2019   Shortness of breath 04/11/2019   Irregular heart rhythm 04/11/2019   Fatigue 04/11/2019   Generalized weakness 04/11/2019   Acute respiratory failure with hypoxia (HCC) 02/02/2019   Essential hypertension 02/02/2019   Type 2 diabetes mellitus with complication, without long-term current use of insulin (HCC) 02/02/2019   Hypokalemia 02/02/2019   BPH (benign prostatic hyperplasia) 12/26/2014    Eloy End, PT, DPT 06/30/20 9:57 AM  [facility] Outpatient Rehabilitation [facility] [address] [place], [place], [address] Phone: [phone]   Fax:  [phone]  Name: Thomas Joseph MRN: [id number] Date of Birth: 11-01-1933

## 2020-07-02 ENCOUNTER — Encounter: Payer: Self-pay | Admitting: Physical Therapy

## 2020-07-02 ENCOUNTER — Other Ambulatory Visit: Payer: Self-pay

## 2020-07-02 ENCOUNTER — Ambulatory Visit: Payer: Medicare Other | Admitting: Physical Therapy

## 2020-07-02 DIAGNOSIS — R262 Difficulty in walking, not elsewhere classified: Secondary | ICD-10-CM | POA: Diagnosis not present

## 2020-07-02 DIAGNOSIS — R2689 Other abnormalities of gait and mobility: Secondary | ICD-10-CM

## 2020-07-02 DIAGNOSIS — M6281 Muscle weakness (generalized): Secondary | ICD-10-CM

## 2020-07-02 DIAGNOSIS — Z8616 Personal history of COVID-19: Secondary | ICD-10-CM | POA: Diagnosis not present

## 2020-07-02 NOTE — Therapy (Signed)
[facility] Outpatient Rehabilitation [facility] [address] [place], [place], [address] Phone: [phone]   Fax:  [phone]  Physical Therapy Treatment  Patient Details  Name: Thomas Joseph MRN: [id number] Date of Birth: 12/10/1933 Referring Provider (PT): Rhetta Mura, MD   Encounter Date: 07/02/2020   PT End of Session - 07/02/20 0958     Visit Number 4    Number of Visits 16    Date for PT Re-Evaluation 08/01/20    Authorization Type MCR    Progress Note Due on Visit 10    PT Start Time 1000    PT Stop Time 1044    PT Time Calculation (min) 44 min    Equipment Utilized During Treatment Gait belt    Activity Tolerance Patient tolerated treatment well    Behavior During Therapy WFL for tasks assessed/performed             Past Medical History:  Diagnosis Date   Anemia    BPH (benign prostatic hyperplasia)    Chronic kidney disease (CKD) stage G3a/A1, moderately decreased glomerular filtration rate (GFR) between 45-59 mL/min/1.73 square meter and albuminuria creatinine ratio less than 30 mg/g (HCC) 03/07/2020   Diabetes mellitus without complication [facility])    ED (erectile dysfunction)    Family history of adverse reaction to anesthesia    son had some problem years ago, pt unsure of what exact problem was   Hypertension    Obesity    Pneumonia     Past Surgical History:  Procedure Laterality Date    LEFT KNEE ARTHROCOPY     COLONOSCOPY WITH PROPOFOL Left 05/07/2020   Procedure: COLONOSCOPY WITH PROPOFOL;  Surgeon: Kathi Der, MD;  Location: [facility] ENDOSCOPY;  Service: Gastroenterology;  Laterality: Left;   COLONOSCOPY WITH PROPOFOL N/A 05/11/2020   Procedure: COLONOSCOPY WITH PROPOFOL;  Surgeon: Charlott Rakes, MD;  Location: [facility] ENDOSCOPY;  Service: Endoscopy;  Laterality: N/A;   ESOPHAGOGASTRODUODENOSCOPY N/A 05/06/2020   Procedure: ESOPHAGOGASTRODUODENOSCOPY (EGD);  Surgeon: Willis Modena, MD;  Location: [facility] ENDOSCOPY;   Service: Endoscopy;  Laterality: N/A;   HERNIA REPAIR     IR ANGIOGRAM SELECTIVE EACH ADDITIONAL VESSEL  05/11/2020   IR ANGIOGRAM VISCERAL SELECTIVE  05/11/2020   IR EMBO ART  VEN HEMORR LYMPH EXTRAV  INC GUIDE ROADMAPPING  05/11/2020   IR IVC FILTER PLMT / S&I /IMG GUID/MOD SED  05/11/2020   IR US GUIDE VASC ACCESS RIGHT  05/11/2020   IR VENOGRAM RENAL BI  05/11/2020   TRANSURETHRAL RESECTION OF PROSTATE N/A 12/26/2014   Procedure: TRANSURETHRAL RESECTION OF THE PROSTATE;  Surgeon: Malen Gauze, MD;  Location: [facility];  Service: Urology;  Laterality: N/A;    There were no vitals filed for this visit.   Subjective Assessment - 07/02/20 0959     Subjective Pt reports that he is feeling stronger.  He is able to transfer in and out of bed independently.  He is walking without a cane.    Pertinent History Hx of DVT, DM, HTN:    Limitations Standing;Walking;Lifting;House hold activities    Patient Stated Goals Wants to get stronger and be able to resume walking up to 3 miles.    Currently in Pain? No/denies                                [facility] Adult PT Treatment/Exercise - 07/02/20 0001       High Level Balance  High Level Balance Comments tandem stance in // 30'' bouts      Lumbar Exercises: Aerobic   Nustep L6 6' no UE support      Knee/Hip Exercises: Machines for Strengthening   Cybex Leg Press 75# 3x5      Knee/Hip Exercises: Standing   Heel Raises Limitations 20x - 2#    Abduction Limitations 3x10 2#    Forward Step Up Limitations 2x10 2# single UE support    Other Standing Knee Exercises walking march in // 1x UE x 3 2# - fwd and bkwd                      PT Short Term Goals - 06/06/20 1006       PT SHORT TERM GOAL #1   Title [name] will be >75% HEP compliant within 3 weeks to facilitate carryover between sessions and move towards eventual independent management of their condition.    Target Date 06/27/20                PT Long Term Goals - 06/06/20 1006       PT LONG TERM GOAL #1   Title [name] will be able to resume walking program up to 1 mile 4x/week    Baseline walking about 5 min    Target Date 08/01/20      PT LONG TERM GOAL #2   Title [name] will improve 30'' STS (MCID 2) to >/= 12x to show improved LE strength and improved transfers.    Baseline 6x    Target Date 08/01/20      PT LONG TERM GOAL #3   Title NAFIS FARNAN will be able to stand for >30''' in tandem stance, to show a significant improvement in balance in order to reduce fall risk    Baseline ~4''    Target Date 08/01/20      PT LONG TERM GOAL #4   Title [name] will improve gait speed to .6 m/s (.1 m/s MCID) to show functional improvement in ambulation    Baseline .27 over 30'    Target Date 08/01/20                   Plan - 07/02/20 1045     Clinical Impression Statement DUEY LILLER is progressing well with therapy.  Today we concentrated on lower extremity strengthening, hip strengthening, and balance/proprioception.  Pt continues to demonstrate shuffling gait pattern at baseline, but is able to significantly improve when his attention is brought to sound of his feet sliding.  Pt reports no change in baseline pain following therapy.  Next session we will continue current course, progressing as able/appropriate.  HEP was updated and reissued to patient.  Pt will continue to benefit from skilled physical therapy to address remaining deficits and achieve listed goals.  Continue per POC.    PT Treatment/Interventions ADLs/Self Care Home Management;Aquatic Therapy;Therapeutic exercise;Neuromuscular re-education;Gait training;Therapeutic activities;Manual techniques    PT Next Visit Plan work on LE strength, gait, balance    PT Home Exercise Plan [email or web address]             Patient will benefit from skilled therapeutic intervention in order to improve the following deficits and  impairments:  Abnormal gait, Decreased endurance, Decreased activity tolerance, Decreased strength, Impaired UE functional use, Difficulty walking, Decreased balance  Visit Diagnosis: Muscle weakness  Other abnormalities of gait and mobility  Balance problem  Problem List Patient Active Problem List   Diagnosis Date Noted   Hypomagnesemia 05/11/2020   Atrial fibrillation with RVR (HCC) 05/11/2020   GI bleed 05/05/2020   AKI (acute kidney injury) (HCC)    2019 novel coronavirus disease (COVID-19) 03/07/2020   Anemia 03/07/2020   Arthropathy 03/07/2020   Body mass index (BMI) 32.0-32.9, adult 03/07/2020   Body mass index (BMI) 34.0-34.9, adult 03/07/2020   Chronic kidney disease (CKD) stage G3a/A1, moderately decreased glomerular filtration rate (GFR) between 45-59 mL/min/1.73 square meter and albuminuria creatinine ratio less than 30 mg/g (HCC) 03/07/2020   Chronic sinusitis 03/07/2020   Constipation 03/07/2020   Diabetic renal disease (HCC) 03/07/2020   Hardening of the aorta (main artery of the heart) (HCC) 03/07/2020   Hyperlipidemia 03/07/2020   Microscopic hematuria 03/07/2020   Nausea 03/07/2020   Other abnormalities of gait and mobility 03/07/2020   Personal history of colonic polyps 03/07/2020   Pneumonia due to SARS-associated coronavirus 03/07/2020   Primary open-angle glaucoma, right eye, mild stage 03/07/2020   Degeneration of lumbar intervertebral disc 03/07/2020   Radiculopathy due to lumbar intervertebral disc disorder 03/07/2020   Right lower quadrant pain 03/07/2020   S/P TURP (status post transurethral resection of prostate) 03/07/2020   Secondary hyperparathyroidism of renal origin (HCC) 03/07/2020   Senile purpura (HCC) 03/07/2020   Sequelae of other specified infectious and parasitic diseases 03/07/2020   Stricture of artery (HCC) 03/07/2020   Subclinical hypothyroidism 03/07/2020   History of COVID-19 04/11/2019   Shortness of breath 04/11/2019    Irregular heart rhythm 04/11/2019   Fatigue 04/11/2019   Generalized weakness 04/11/2019   Acute respiratory failure with hypoxia (HCC) 02/02/2019   Essential hypertension 02/02/2019   Type 2 diabetes mellitus with complication, without long-term current use of insulin (HCC) 02/02/2019   Hypokalemia 02/02/2019   BPH (benign prostatic hyperplasia) 12/26/2014    Alphonzo Severance PT, DPT 07/02/20 11:39 AM   [facility] Outpatient Rehabilitation [facility] [address] [place], [place], [address] Phone: [phone]   Fax:  [phone]  Name: DAYON WITT MRN: [id number] Date of Birth: [birth year]

## 2020-07-02 NOTE — Patient Instructions (Signed)
Access Code: [email or web address] URL: [email or web address] Date: 07/02/2020 Prepared by: Alphonzo Severance  Exercises Standing March with Counter Support - 1 x daily - 7 x weekly - 3 sets - 10 reps Standing Hip Abduction with Counter Support - 1 x daily - 7 x weekly - 3 sets - 10 reps Standing Hip Extension with Counter Support - 1 x daily - 7 x weekly - 3 sets - 10 reps Sit to Stand Without Arm Support - 1 x daily - 7 x weekly - 5 sets - 5 reps Standing Tandem Balance with Counter Support - 1 x daily - 7 x weekly - 3 sets - 45'' hold

## 2020-07-07 ENCOUNTER — Other Ambulatory Visit: Payer: Self-pay

## 2020-07-07 ENCOUNTER — Ambulatory Visit: Payer: Medicare Other

## 2020-07-07 DIAGNOSIS — R262 Difficulty in walking, not elsewhere classified: Secondary | ICD-10-CM

## 2020-07-07 DIAGNOSIS — M6281 Muscle weakness (generalized): Secondary | ICD-10-CM

## 2020-07-07 DIAGNOSIS — Z8616 Personal history of COVID-19: Secondary | ICD-10-CM

## 2020-07-07 DIAGNOSIS — R2689 Other abnormalities of gait and mobility: Secondary | ICD-10-CM

## 2020-07-07 NOTE — Therapy (Signed)
[facility] Outpatient Rehabilitation [facility] [address] [place], [place], [address] Phone: [phone]   Fax:  [phone]  Physical Therapy Treatment  Patient Details  Name: Thomas Joseph MRN: [id number] Date of Birth: [birth year] Referring Provider (PT): Thomas Mura, MD   Encounter Date: 07/07/2020   PT End of Session - 07/07/20 0958     Visit Number 5    Number of Visits 16    Date for PT Re-Evaluation 08/01/20    Authorization Type MCR    Progress Note Due on Visit 10    PT Start Time 1000    PT Stop Time 1042    PT Time Calculation (min) 42 min    Equipment Utilized During Treatment Gait belt    Activity Tolerance Patient tolerated treatment well    Behavior During Therapy WFL for tasks assessed/performed             Past Medical History:  Diagnosis Date   Anemia    BPH (benign prostatic hyperplasia)    Chronic kidney disease (CKD) stage G3a/A1, moderately decreased glomerular filtration rate (GFR) between 45-59 mL/min/1.73 square meter and albuminuria creatinine ratio less than 30 mg/g (HCC) 03/07/2020   Diabetes mellitus without complication [facility])    ED (erectile dysfunction)    Family history of adverse reaction to anesthesia    son had some problem years ago, pt unsure of what exact problem was   Hypertension    Obesity    Pneumonia     Past Surgical History:  Procedure Laterality Date    LEFT KNEE ARTHROCOPY     COLONOSCOPY WITH PROPOFOL Left 05/07/2020   Procedure: COLONOSCOPY WITH PROPOFOL;  Surgeon: Thomas Der, MD;  [facility] ENDOSCOPY;  Service: Gastroenterology;  Laterality: Left;   COLONOSCOPY WITH PROPOFOL N/A 05/11/2020   Procedure: COLONOSCOPY WITH PROPOFOL;  Surgeon: Thomas Rakes, MD;  Location: [facility] ENDOSCOPY;  Service: Endoscopy;  Laterality: N/A;   ESOPHAGOGASTRODUODENOSCOPY N/A 05/06/2020   Procedure: ESOPHAGOGASTRODUODENOSCOPY (EGD);  Surgeon: Thomas Modena, MD;  Location: [facility] ENDOSCOPY;   Service: Endoscopy;  Laterality: N/A;   HERNIA REPAIR     IR ANGIOGRAM SELECTIVE EACH ADDITIONAL VESSEL  05/11/2020   IR ANGIOGRAM VISCERAL SELECTIVE  05/11/2020   IR EMBO ART  VEN HEMORR LYMPH EXTRAV  INC GUIDE ROADMAPPING  05/11/2020   IR IVC FILTER PLMT / S&I /IMG GUID/MOD SED  05/11/2020   IR US GUIDE VASC ACCESS RIGHT  05/11/2020   IR VENOGRAM RENAL BI  05/11/2020   TRANSURETHRAL RESECTION OF PROSTATE N/A 12/26/2014   Procedure: TRANSURETHRAL RESECTION OF THE PROSTATE;  Surgeon: Thomas Gauze, MD;  Location: [facility] ORS;  Service: Urology;  Laterality: N/A;    There were no vitals filed for this visit.   Subjective Assessment - 07/07/20 1005     Subjective Pt presents to PT noting he continues to feel stronger. He has been compliant with his HEP with no adverse effect. Pt is ready to begin PT treatment at this time.    Currently in Pain? No/denies                               [facility] Adult PT Treatment/Exercise - 07/07/20 0001       Knee/Hip Exercises: Aerobic   Nustep L4 UE/LE x 5 min while taking subjective      Knee/Hip Exercises: Standing   Heel Raises Limitations 20x - 2#    Knee Flexion Limitations 2x10 2#  Abduction Limitations 3x10 2#    Extension Limitations 2x10 2#    Forward Step Up Limitations 2x10 2# single UE support    Other Standing Knee Exercises walking march in // 1x UE x 3 2# - fwd and bkwd                      PT Short Term Goals - 06/06/20 1006       PT SHORT TERM GOAL #1   Title Thomas Joseph will be >75% HEP compliant within 3 weeks to facilitate carryover between sessions and move towards eventual independent management of their condition.    Target Date 06/27/20               PT Long Term Goals - 06/06/20 1006       PT LONG TERM GOAL #1   Title Thomas Joseph will be able to resume walking program up to 1 mile 4x/week    Baseline walking about 5 min    Target Date 08/01/20      PT LONG TERM GOAL #2    Title Thomas Joseph will improve 30'' STS (MCID 2) to >/= 12x to show improved LE strength and improved transfers.    Baseline 6x    Target Date 08/01/20      PT LONG TERM GOAL #3   Title Thomas Joseph will be able to stand for >30''' in tandem stance, to show a significant improvement in balance in order to reduce fall risk    Baseline ~4''    Target Date 08/01/20      PT LONG TERM GOAL #4   Title Thomas Joseph will improve gait speed to .6 m/s (.1 m/s MCID) to show functional improvement in ambulation    Baseline .27 over 30'    Target Date 08/01/20                   Plan - 07/07/20 1017     Clinical Impression Statement Pt was once again able to complete prescribed exercises with no adverse effect or increase in pain. He continues to show improving LE strength and functional mobility, although shuffling gait pattern continues. Pt continues to benefit from skilled PT services and will continue to be seen and progressed as able.    PT Treatment/Interventions ADLs/Self Care Home Management;Aquatic Therapy;Therapeutic exercise;Neuromuscular re-education;Gait training;Therapeutic activities;Manual techniques    PT Next Visit Plan work on LE strength, gait, balance    PT Home Exercise Plan [id number]             Patient will benefit from skilled therapeutic intervention in order to improve the following deficits and impairments:  Abnormal gait, Decreased endurance, Decreased activity tolerance, Decreased strength, Impaired UE functional use, Difficulty walking, Decreased balance  Visit Diagnosis: Muscle weakness  Other abnormalities of gait and mobility  Balance problem  History of 2019 novel coronavirus disease (COVID-19)  Muscle weakness (generalized)  Difficulty in walking, not elsewhere classified     Problem List Patient Active Problem List   Diagnosis Date Noted   Hypomagnesemia 05/11/2020   Atrial fibrillation with RVR (HCC) 05/11/2020    GI bleed 05/05/2020   AKI (acute kidney injury) (HCC)    2019 novel coronavirus disease (COVID-19) 03/07/2020   Anemia 03/07/2020   Arthropathy 03/07/2020   Body mass index (BMI) 32.0-32.9, adult 03/07/2020   Body mass index (BMI) 34.0-34.9, adult 03/07/2020   Chronic kidney disease (CKD) stage G3a/A1, moderately decreased  glomerular filtration rate (GFR) between 45-59 mL/min/1.73 square meter and albuminuria creatinine ratio less than 30 mg/g (HCC) 03/07/2020   Chronic sinusitis 03/07/2020   Constipation 03/07/2020   Diabetic renal disease (HCC) 03/07/2020   Hardening of the aorta (main artery of the heart) (HCC) 03/07/2020   Hyperlipidemia 03/07/2020   Microscopic hematuria 03/07/2020   Nausea 03/07/2020   Other abnormalities of gait and mobility 03/07/2020   Personal history of colonic polyps 03/07/2020   Pneumonia due to SARS-associated coronavirus 03/07/2020   Primary open-angle glaucoma, right eye, mild stage 03/07/2020   Degeneration of lumbar intervertebral disc 03/07/2020   Radiculopathy due to lumbar intervertebral disc disorder 03/07/2020   Right lower quadrant pain 03/07/2020   S/P TURP (status post transurethral resection of prostate) 03/07/2020   Secondary hyperparathyroidism of renal origin (HCC) 03/07/2020   Senile purpura (HCC) 03/07/2020   Sequelae of other specified infectious and parasitic diseases 03/07/2020   Stricture of artery (HCC) 03/07/2020   Subclinical hypothyroidism 03/07/2020   History of COVID-19 04/11/2019   Shortness of breath 04/11/2019   Irregular heart rhythm 04/11/2019   Fatigue 04/11/2019   Generalized weakness 04/11/2019   Acute respiratory failure with hypoxia (HCC) 02/02/2019   Essential hypertension 02/02/2019   Type 2 diabetes mellitus with complication, without long-term current use of insulin (HCC) 02/02/2019   Hypokalemia 02/02/2019   BPH (benign prostatic hyperplasia) 12/26/2014    Eloy End, PT, DPT 07/07/20 10:43  AM  [facility] [address] [place], [place], [address] Phone: [phone]   Fax:  [phone]  Name: Thomas Joseph MRN: [id number] Date of Birth: 09-15-1933

## 2020-07-09 ENCOUNTER — Other Ambulatory Visit: Payer: Self-pay

## 2020-07-09 ENCOUNTER — Ambulatory Visit: Payer: Medicare Other

## 2020-07-09 DIAGNOSIS — M6281 Muscle weakness (generalized): Secondary | ICD-10-CM | POA: Diagnosis not present

## 2020-07-09 DIAGNOSIS — R262 Difficulty in walking, not elsewhere classified: Secondary | ICD-10-CM | POA: Diagnosis not present

## 2020-07-09 DIAGNOSIS — R2689 Other abnormalities of gait and mobility: Secondary | ICD-10-CM | POA: Diagnosis not present

## 2020-07-09 DIAGNOSIS — Z8616 Personal history of COVID-19: Secondary | ICD-10-CM | POA: Diagnosis not present

## 2020-07-09 NOTE — Therapy (Signed)
[facility] Outpatient Rehabilitation [facility] [address] [place], [place], [address] Phone: [phone]   Fax:  [phone]  Physical Therapy Treatment  Patient Details  Name: Thomas Joseph MRN: [id number] Date of Birth: 1933/06/09 Referring Provider (PT): Rhetta Mura, MD   Encounter Date: 07/09/2020   PT End of Session - 07/09/20 1002     Visit Number 6    Number of Visits 16    Date for PT Re-Evaluation 08/01/20    Authorization Type MCR    Progress Note Due on Visit 10    PT Start Time 1000    PT Stop Time 1041    PT Time Calculation (min) 41 min    Equipment Utilized During Treatment Gait belt    Activity Tolerance Patient tolerated treatment well    Behavior During Therapy WFL for tasks assessed/performed             Past Medical History:  Diagnosis Date   Anemia    BPH (benign prostatic hyperplasia)    Chronic kidney disease (CKD) stage G3a/A1, moderately decreased glomerular filtration rate (GFR) between 45-59 mL/min/1.73 square meter and albuminuria creatinine ratio less than 30 mg/g (HCC) 03/07/2020   Diabetes mellitus without complication [facility])    ED (erectile dysfunction)    Family history of adverse reaction to anesthesia    son had some problem years ago, pt unsure of what exact problem was   Hypertension    Obesity    Pneumonia     Past Surgical History:  Procedure Laterality Date    LEFT KNEE ARTHROCOPY     COLONOSCOPY WITH PROPOFOL Left 05/07/2020   Procedure: COLONOSCOPY WITH PROPOFOL;  Surgeon: Kathi Der, MD;  Location: [facility] ENDOSCOPY;  Service: Gastroenterology;  Laterality: Left;   COLONOSCOPY WITH PROPOFOL N/A 05/11/2020   Procedure: COLONOSCOPY WITH PROPOFOL;  Surgeon: Charlott Rakes, MD;  Location: [facility] ENDOSCOPY;  Service: Endoscopy;  Laterality: N/A;   ESOPHAGOGASTRODUODENOSCOPY N/A 05/06/2020   Procedure: ESOPHAGOGASTRODUODENOSCOPY (EGD);  Surgeon: Willis Modena, MD;  Location: [facility] ENDOSCOPY;   Service: Endoscopy;  Laterality: N/A;   HERNIA REPAIR     IR ANGIOGRAM SELECTIVE EACH ADDITIONAL VESSEL  05/11/2020   IR ANGIOGRAM VISCERAL SELECTIVE  05/11/2020   IR EMBO ART  VEN HEMORR LYMPH EXTRAV  INC GUIDE ROADMAPPING  05/11/2020   IR IVC FILTER PLMT / S&I /IMG GUID/MOD SED  05/11/2020   IR US GUIDE VASC ACCESS RIGHT  05/11/2020   IR VENOGRAM RENAL BI  05/11/2020   TRANSURETHRAL RESECTION OF PROSTATE N/A 12/26/2014   Procedure: TRANSURETHRAL RESECTION OF THE PROSTATE;  Surgeon: Malen Gauze, MD;  Location: [facility];  Service: Urology;  Laterality: N/A;    There were no vitals filed for this visit.   Subjective Assessment - 07/09/20 1003     Subjective Pt presents to PT with no current reports of muscle soreness, but did not some fatigue after last session. He is ready to begin PT treatment at this time.    Currently in Pain? No/denies                               [facility] Adult PT Treatment/Exercise - 07/09/20 0001       Knee/Hip Exercises: Aerobic   Nustep L5 UE/LE x 5 min while taking subjective      Knee/Hip Exercises: Machines for Strengthening   Total Gym Leg Press 3x8 80lbs      Knee/Hip Exercises: Standing  Abduction Limitations 3x10 3#    Other Standing Knee Exercises walking march in // 1x UE x 3 3# - fwd and bkwd      Knee/Hip Exercises: Seated   Sit to Sand 3 sets;10 reps   sitting on 4in platform                     PT Short Term Goals - 06/06/20 1006       PT SHORT TERM GOAL #1   Title Thomas Joseph will be >75% HEP compliant within 3 weeks to facilitate carryover between sessions and move towards eventual independent management of their condition.    Target Date 06/27/20               PT Long Term Goals - 06/06/20 1006       PT LONG TERM GOAL #1   Title Thomas Joseph will be able to resume walking program up to 1 mile 4x/week    Baseline walking about 5 min    Target Date 08/01/20      PT LONG TERM GOAL #2    Title Thomas Joseph will improve 30'' STS (MCID 2) to >/= 12x to show improved LE strength and improved transfers.    Baseline 6x    Target Date 08/01/20      PT LONG TERM GOAL #3   Title Thomas Joseph will be able to stand for >30''' in tandem stance, to show a significant improvement in balance in order to reduce fall risk    Baseline ~4''    Target Date 08/01/20      PT LONG TERM GOAL #4   Title Thomas Joseph will improve gait speed to .6 m/s (.1 m/s MCID) to show functional improvement in ambulation    Baseline .27 over 30'    Target Date 08/01/20                   Plan - 07/09/20 1029     Clinical Impression Statement Pt was able to complete all prescribed exercises with no adverse effect and continued progression. He is doing well with PT and continues to show improving LE strength and functional mobility. Will continue to progress as tolerated per POC.    PT Treatment/Interventions ADLs/Self Care Home Management;Aquatic Therapy;Therapeutic exercise;Neuromuscular re-education;Gait training;Therapeutic activities;Manual techniques    PT Next Visit Plan work on LE strength, gait, balance    PT Home Exercise Plan [id number]             Patient will benefit from skilled therapeutic intervention in order to improve the following deficits and impairments:  Abnormal gait, Decreased endurance, Decreased activity tolerance, Decreased strength, Impaired UE functional use, Difficulty walking, Decreased balance  Visit Diagnosis: Muscle weakness  Other abnormalities of gait and mobility  Balance problem  History of 2019 novel coronavirus disease (COVID-19)  Muscle weakness (generalized)  Difficulty in walking, not elsewhere classified     Problem List Patient Active Problem List   Diagnosis Date Noted   Hypomagnesemia 05/11/2020   Atrial fibrillation with RVR (HCC) 05/11/2020   GI bleed 05/05/2020   AKI (acute kidney injury) (HCC)    2019 novel  coronavirus disease (COVID-19) 03/07/2020   Anemia 03/07/2020   Arthropathy 03/07/2020   Body mass index (BMI) 32.0-32.9, adult 03/07/2020   Body mass index (BMI) 34.0-34.9, adult 03/07/2020   Chronic kidney disease (CKD) stage G3a/A1, moderately decreased glomerular filtration rate (GFR) between 45-59 mL/min/1.73 square meter  and albuminuria creatinine ratio less than 30 mg/g (HCC) 03/07/2020   Chronic sinusitis 03/07/2020   Constipation 03/07/2020   Diabetic renal disease (HCC) 03/07/2020   Hardening of the aorta (main artery of the heart) (HCC) 03/07/2020   Hyperlipidemia 03/07/2020   Microscopic hematuria 03/07/2020   Nausea 03/07/2020   Other abnormalities of gait and mobility 03/07/2020   Personal history of colonic polyps 03/07/2020   Pneumonia due to SARS-associated coronavirus 03/07/2020   Primary open-angle glaucoma, right eye, mild stage 03/07/2020   Degeneration of lumbar intervertebral disc 03/07/2020   Radiculopathy due to lumbar intervertebral disc disorder 03/07/2020   Right lower quadrant pain 03/07/2020   S/P TURP (status post transurethral resection of prostate) 03/07/2020   Secondary hyperparathyroidism of renal origin (HCC) 03/07/2020   Senile purpura (HCC) 03/07/2020   Sequelae of other specified infectious and parasitic diseases 03/07/2020   Stricture of artery (HCC) 03/07/2020   Subclinical hypothyroidism 03/07/2020   History of COVID-19 04/11/2019   Shortness of breath 04/11/2019   Irregular heart rhythm 04/11/2019   Fatigue 04/11/2019   Generalized weakness 04/11/2019   Acute respiratory failure with hypoxia (HCC) 02/02/2019   Essential hypertension 02/02/2019   Type 2 diabetes mellitus with complication, without long-term current use of insulin (HCC) 02/02/2019   Hypokalemia 02/02/2019   BPH (benign prostatic hyperplasia) 12/26/2014    Eloy End, PT, DPT 07/09/20 10:43 AM  [facility]patient [facility] [address] [place], [place], [address] Phone: [phone]   Fax:  [phone]  Name: Thomas Joseph MRN: [id number] Date of Birth: [birth year]

## 2020-07-10 DIAGNOSIS — D500 Iron deficiency anemia secondary to blood loss (chronic): Secondary | ICD-10-CM | POA: Diagnosis not present

## 2020-07-10 DIAGNOSIS — K5900 Constipation, unspecified: Secondary | ICD-10-CM | POA: Diagnosis not present

## 2020-07-10 DIAGNOSIS — K5731 Diverticulosis of large intestine without perforation or abscess with bleeding: Secondary | ICD-10-CM | POA: Diagnosis not present

## 2020-07-16 ENCOUNTER — Ambulatory Visit: Payer: Medicare Other | Attending: Family Medicine

## 2020-07-16 ENCOUNTER — Other Ambulatory Visit: Payer: Self-pay

## 2020-07-16 DIAGNOSIS — R2689 Other abnormalities of gait and mobility: Secondary | ICD-10-CM | POA: Insufficient documentation

## 2020-07-16 DIAGNOSIS — Z8616 Personal history of COVID-19: Secondary | ICD-10-CM | POA: Diagnosis not present

## 2020-07-16 DIAGNOSIS — R262 Difficulty in walking, not elsewhere classified: Secondary | ICD-10-CM | POA: Insufficient documentation

## 2020-07-16 DIAGNOSIS — M6281 Muscle weakness (generalized): Secondary | ICD-10-CM | POA: Diagnosis not present

## 2020-07-16 NOTE — Therapy (Signed)
[facility] Outpatient Rehabilitation [facility] [address] [place], [place], [address] Phone: [phone]   Fax:  [phone]  Physical Therapy Treatment  Patient Details  Name: Thomas Joseph MRN: [id number] Date of Birth: Jan 25, 1933 Referring Provider (PT): Thomas Mura, MD   Encounter Date: 07/16/2020   PT End of Session - 07/16/20 0956     Visit Number 7    Number of Visits 16    Date for PT Re-Evaluation 08/01/20    Authorization Type MCR    Progress Note Due on Visit 10    PT Start Time 1000    PT Stop Time 1041    PT Time Calculation (min) 41 min    Equipment Utilized During Treatment Gait belt    Activity Tolerance Patient tolerated treatment well    Behavior During Therapy WFL for tasks assessed/performed             Past Medical History:  Diagnosis Date   Anemia    BPH (benign prostatic hyperplasia)    Chronic kidney disease (CKD) stage G3a/A1, moderately decreased glomerular filtration rate (GFR) between 45-59 mL/min/1.73 square meter and albuminuria creatinine ratio less than 30 mg/g (HCC) 03/07/2020   Diabetes mellitus without complication [facility])    ED (erectile dysfunction)    Family history of adverse reaction to anesthesia    son had some problem years ago, pt unsure of what exact problem was   Hypertension    Obesity    Pneumonia     Past Surgical History:  Procedure Laterality Date    LEFT KNEE ARTHROCOPY     COLONOSCOPY WITH PROPOFOL Left 05/07/2020   Procedure: COLONOSCOPY WITH PROPOFOL;  Surgeon: Thomas Der, MD;  Location: [facility] ENDOSCOPY;  Service: Gastroenterology;  Laterality: Left;   COLONOSCOPY WITH PROPOFOL N/A 05/11/2020   Procedure: COLONOSCOPY WITH PROPOFOL;  Surgeon: Thomas Rakes, MD;  Location: [facility] ENDOSCOPY;  Service: Endoscopy;  Laterality: N/A;   ESOPHAGOGASTRODUODENOSCOPY N/A 05/06/2020   Procedure: ESOPHAGOGASTRODUODENOSCOPY (EGD);  Surgeon: Thomas Modena, MD;  Location: [facility] ENDOSCOPY;  Service:  Endoscopy;  Laterality: N/A;   HERNIA REPAIR     IR ANGIOGRAM SELECTIVE EACH ADDITIONAL VESSEL  05/11/2020   IR ANGIOGRAM VISCERAL SELECTIVE  05/11/2020   IR EMBO ART  VEN HEMORR LYMPH EXTRAV  INC GUIDE ROADMAPPING  05/11/2020   IR IVC FILTER PLMT / S&I /IMG GUID/MOD SED  05/11/2020   IR US GUIDE VASC ACCESS RIGHT  05/11/2020   IR VENOGRAM RENAL BI  05/11/2020   TRANSURETHRAL RESECTION OF PROSTATE N/A 12/26/2014   Procedure: TRANSURETHRAL RESECTION OF THE PROSTATE;  Surgeon: Thomas Gauze, MD;  Location: [facility];  Service: Urology;  Laterality: N/A;    There were no vitals filed for this visit.   Subjective Assessment - 07/16/20 1009     Subjective Pt presents to PT with no current pain or discomfort. He has continued to be compliant with HEP with no adverse effect. Pt is ready to begin PT at this time.    Currently in Pain? No/denies                [facility] PT Assessment - 07/16/20 0001       Transfers   Comments 30'' STS: 6x      High Level Balance   High Level Balance Comments tandem stance in // 15" before UE support needed  [facility] Adult PT Treatment/Exercise - 07/16/20 0001       Neuro Re-ed    Neuro Re-ed Details  FT on foam 2x30 sec; HT raises on foam 1 UE support x 15; tandem walk x 1 lap in //      Knee/Hip Exercises: Aerobic   Nustep L5 UE/LE x 5 min while taking subjective      Knee/Hip Exercises: Machines for Strengthening   Total Gym Leg Press 3x8 85lbs      Knee/Hip Exercises: Standing   Abduction Limitations 2x15 3lb    Other Standing Knee Exercises walking march in // 1x UE x 3 3# - fwd and bkwd      Knee/Hip Exercises: Seated   Sit to Sand 2 sets;10 reps;without UE support                      PT Short Term Goals - 07/16/20 1012       PT SHORT TERM GOAL #1   Title Thomas Joseph will be >75% HEP compliant within 3 weeks to facilitate carryover between sessions and move towards eventual  independent management of their condition.    Status Achieved    Target Date 06/27/20               PT Long Term Goals - 07/16/20 1012       PT LONG TERM GOAL #1   Title Thomas Joseph will be able to resume walking program up to 1 mile 4x/week    Baseline walking about 5 min    Status On-going      PT LONG TERM GOAL #2   Title Thomas Joseph will improve 30'' STS (MCID 2) to >/= 12x to show improved LE strength and improved transfers.    Baseline 6x; 07/16/20 - 6 reps    Status On-going      PT LONG TERM GOAL #3   Title Thomas Joseph will be able to stand for >30''' in tandem stance, to show a significant improvement in balance in order to reduce fall risk    Baseline ~4'' - 15" 07/16/20    Status On-going      PT LONG TERM GOAL #4   Title Thomas Joseph will improve gait speed to .6 m/s (.1 m/s MCID) to show functional improvement in ambulation    Baseline .27 over 30'    Status On-going                   Plan - 07/16/20 1026     Clinical Impression Statement Pt continues to make progress with PT, but was unable to increase reps in 30" STS or gait speed today. He did show improved stability during testing and is progressing his strength and activity tolerance well. He continues to benefit from skilled PT and should continue to be seen per POC.    PT Treatment/Interventions ADLs/Self Care Home Management;Aquatic Therapy;Therapeutic exercise;Neuromuscular re-education;Gait training;Therapeutic activities;Manual techniques    PT Next Visit Plan work on LE strength, gait, balance    PT Home Exercise Plan [id number]             Patient will benefit from skilled therapeutic intervention in order to improve the following deficits and impairments:  Abnormal gait, Decreased endurance, Decreased activity tolerance, Decreased strength, Impaired UE functional use, Difficulty walking, Decreased balance  Visit Diagnosis: Muscle weakness  Other abnormalities  of gait and mobility  Balance problem     Problem  List Patient Active Problem List   Diagnosis Date Noted   Hypomagnesemia 05/11/2020   Atrial fibrillation with RVR (HCC) 05/11/2020   GI bleed 05/05/2020   AKI (acute kidney injury) (HCC)    2019 novel coronavirus disease (COVID-19) 03/07/2020   Anemia 03/07/2020   Arthropathy 03/07/2020   Body mass index (BMI) 32.0-32.9, adult 03/07/2020   Body mass index (BMI) 34.0-34.9, adult 03/07/2020   Chronic kidney disease (CKD) stage G3a/A1, moderately decreased glomerular filtration rate (GFR) between 45-59 mL/min/1.73 square meter and albuminuria creatinine ratio less than 30 mg/g (HCC) 03/07/2020   Chronic sinusitis 03/07/2020   Constipation 03/07/2020   Diabetic renal disease (HCC) 03/07/2020   Hardening of the aorta (main artery of the heart) (HCC) 03/07/2020   Hyperlipidemia 03/07/2020   Microscopic hematuria 03/07/2020   Nausea 03/07/2020   Other abnormalities of gait and mobility 03/07/2020   Personal history of colonic polyps 03/07/2020   Pneumonia due to SARS-associated coronavirus 03/07/2020   Primary open-angle glaucoma, right eye, mild stage 03/07/2020   Degeneration of lumbar intervertebral disc 03/07/2020   Radiculopathy due to lumbar intervertebral disc disorder 03/07/2020   Right lower quadrant pain 03/07/2020   S/P TURP (status post transurethral resection of prostate) 03/07/2020   Secondary hyperparathyroidism of renal origin (HCC) 03/07/2020   Senile purpura (HCC) 03/07/2020   Sequelae of other specified infectious and parasitic diseases 03/07/2020   Stricture of artery (HCC) 03/07/2020   Subclinical hypothyroidism 03/07/2020   History of COVID-19 04/11/2019   Shortness of breath 04/11/2019   Irregular heart rhythm 04/11/2019   Fatigue 04/11/2019   Generalized weakness 04/11/2019   Acute respiratory failure with hypoxia (HCC) 02/02/2019   Essential hypertension 02/02/2019   Type 2 diabetes mellitus with  complication, without long-term current use of insulin (HCC) 02/02/2019   Hypokalemia 02/02/2019   BPH (benign prostatic hyperplasia) 12/26/2014    Eloy End, PT, DPT 07/16/20 10:42 AM  [facility] Outpatient Rehabilitation [facility] [address] [place], [place], [address] Phone: [phone]   Fax:  [phone]  Name: Thomas Joseph MRN: [id number] Date of Birth: 1933-05-07

## 2020-07-21 ENCOUNTER — Ambulatory Visit: Payer: Medicare Other

## 2020-07-21 ENCOUNTER — Other Ambulatory Visit: Payer: Self-pay

## 2020-07-21 DIAGNOSIS — R2689 Other abnormalities of gait and mobility: Secondary | ICD-10-CM | POA: Diagnosis not present

## 2020-07-21 DIAGNOSIS — Z8616 Personal history of COVID-19: Secondary | ICD-10-CM | POA: Diagnosis not present

## 2020-07-21 DIAGNOSIS — R262 Difficulty in walking, not elsewhere classified: Secondary | ICD-10-CM

## 2020-07-21 DIAGNOSIS — M6281 Muscle weakness (generalized): Secondary | ICD-10-CM | POA: Diagnosis not present

## 2020-07-21 IMAGING — CR DG CHEST 2V
2 series · 2 of 2 positions shown · non-contrast
Comparison: February 02, 2019

CLINICAL DATA: [id number] positive.  Shortness of breath

EXAM:
CHEST - 2 VIEW

[w chest pa]
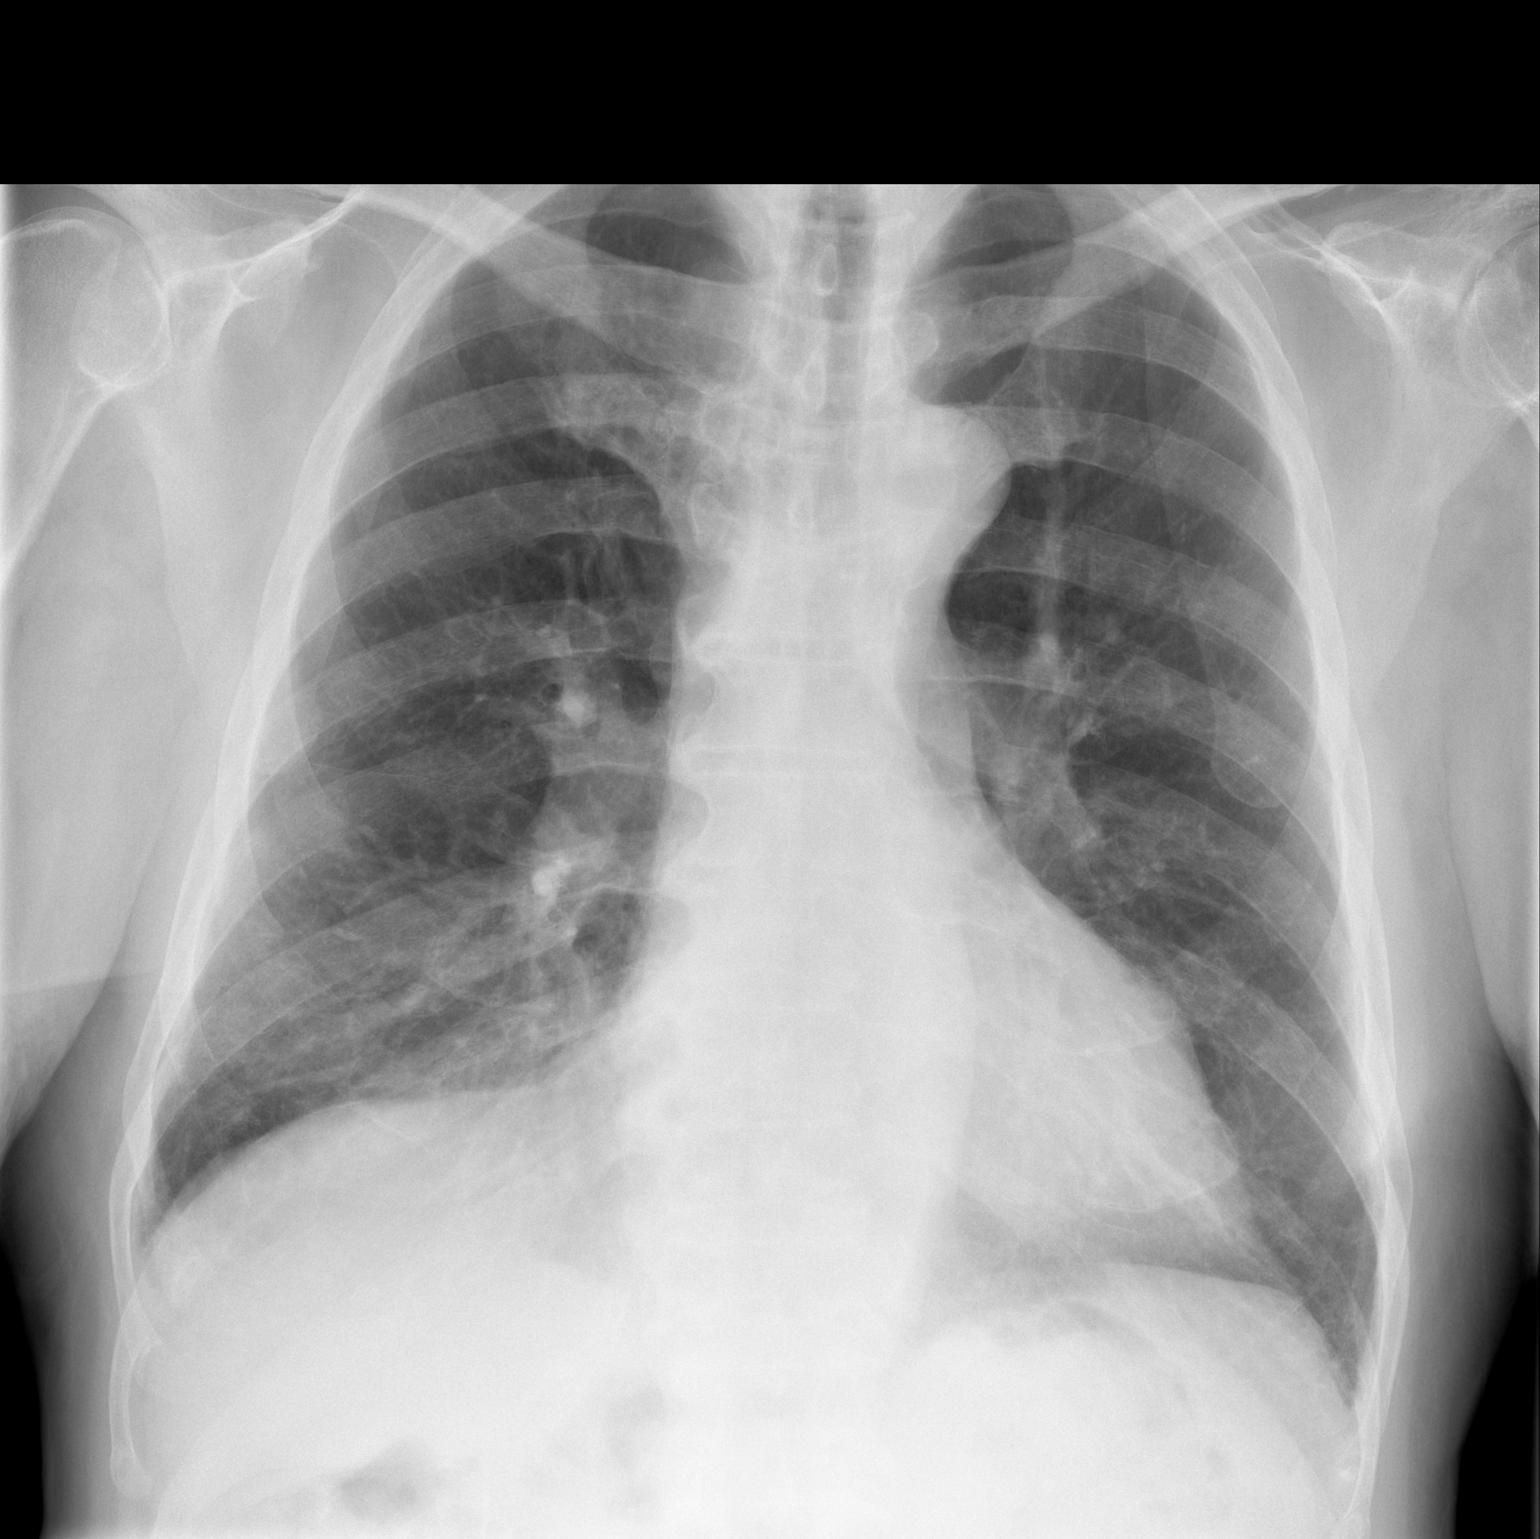

[w chest lat]
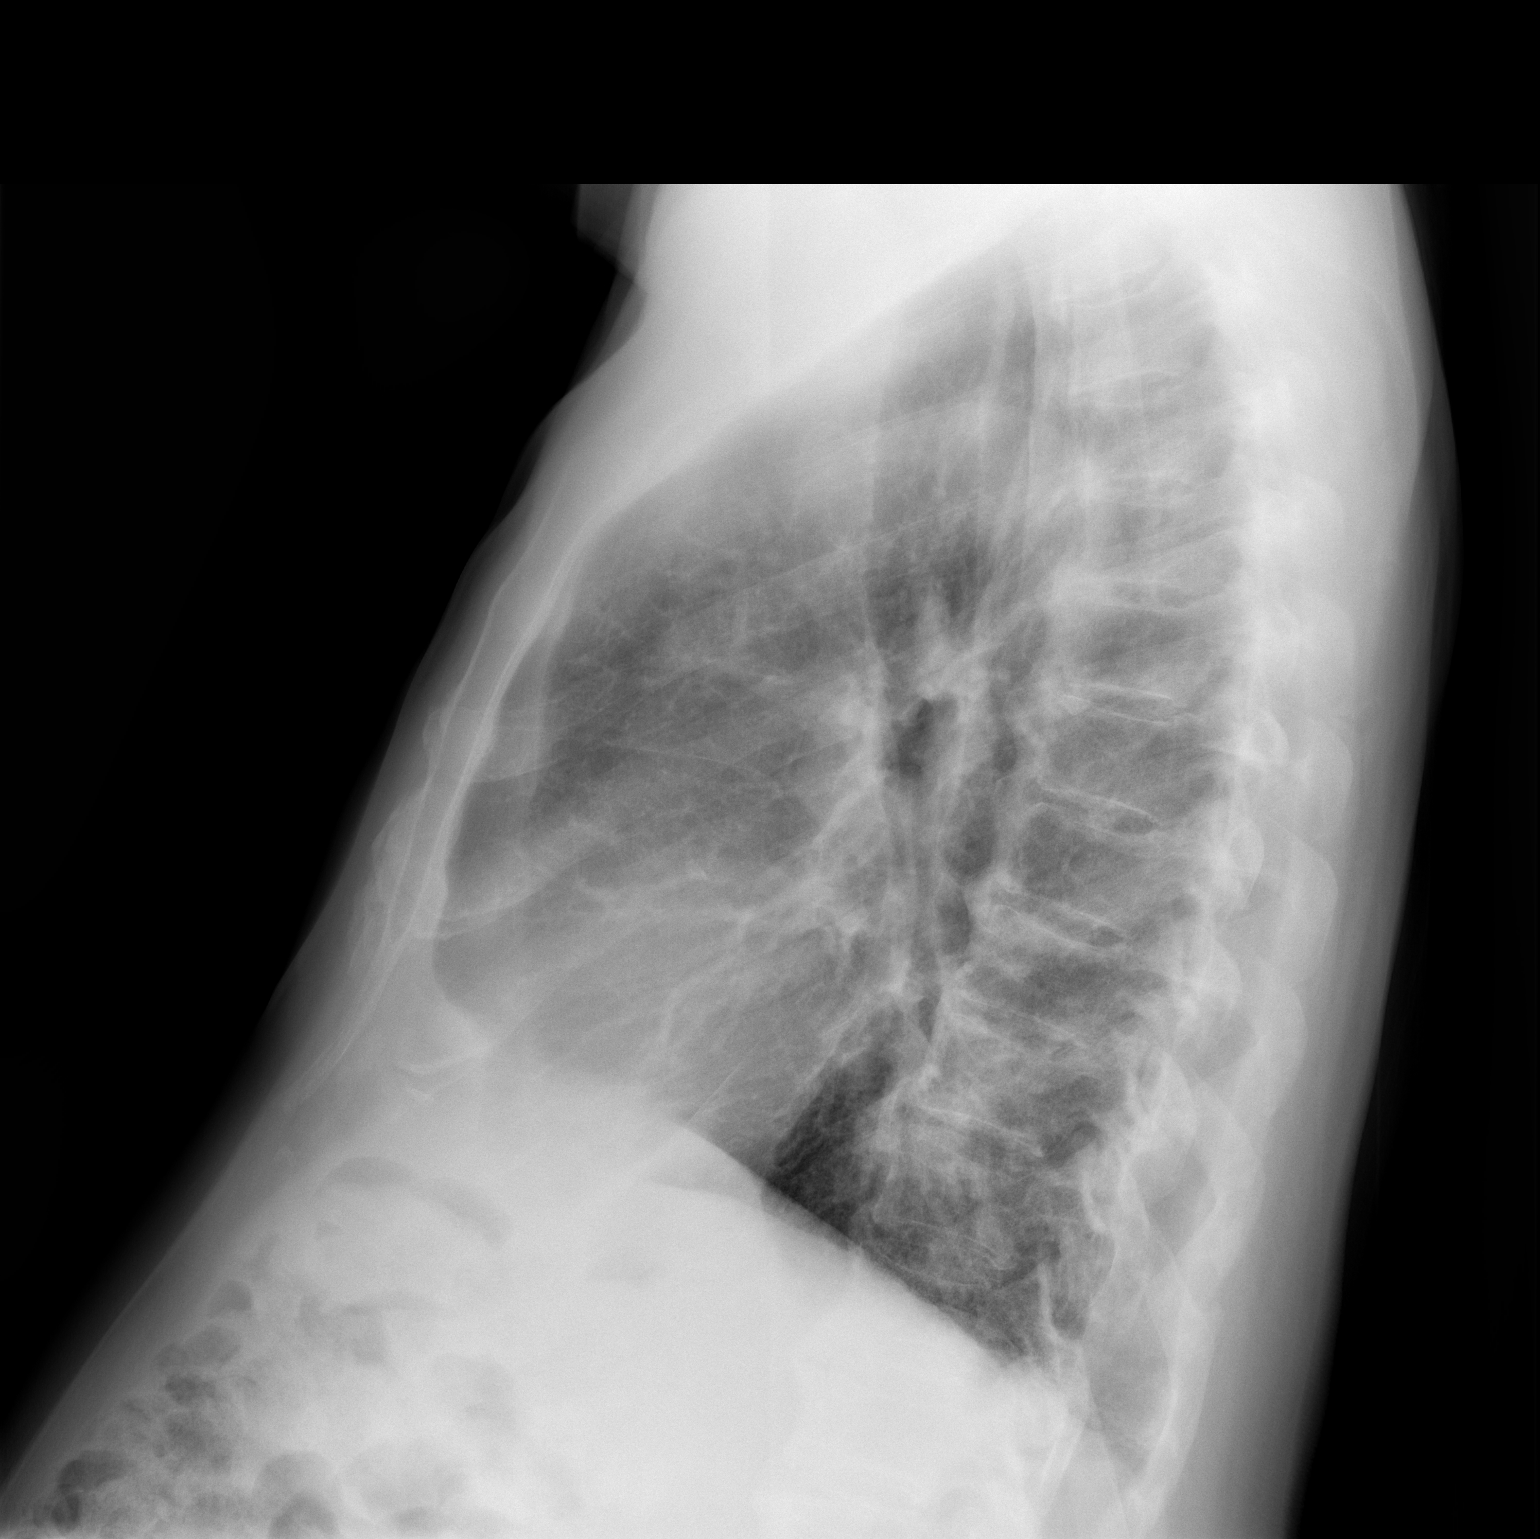

[2 of 2 positions shown; findings below may reference images not displayed]

FINDINGS: Lungs are clear. Heart size and pulmonary vascularity are normal. No
adenopathy. There is degenerative change in the thoracic spine in
each shoulder.
IMPRESSION: Lungs clear.  No adenopathy.

## 2020-07-21 NOTE — Therapy (Signed)
[facility] [address] [place], [place], [address] Phone: [phone]   Fax:  [phone]  Physical Therapy Treatment  Patient Details  Name: Thomas Joseph MRN: [id number] Date of Birth: June 20, 1933 Referring Provider (PT): Rhetta Mura, MD   Encounter Date: 07/21/2020   PT End of Session - 07/21/20 1040     Visit Number 8    Number of Visits 16    Date for PT Re-Evaluation 08/01/20    Authorization Type MCR    Progress Note Due on Visit 10    PT Start Time 1043    PT Stop Time 1123    PT Time Calculation (min) 40 min    Equipment Utilized During Treatment Gait belt    Activity Tolerance Patient tolerated treatment well    Behavior During Therapy WFL for tasks assessed/performed             Past Medical History:  Diagnosis Date   Anemia    BPH (benign prostatic hyperplasia)    Chronic kidney disease (CKD) stage G3a/A1, moderately decreased glomerular filtration rate (GFR) between 45-59 mL/min/1.73 square meter and albuminuria creatinine ratio less than 30 mg/g (HCC) 03/07/2020   Diabetes mellitus without complication [facility])    ED (erectile dysfunction)    Family history of adverse reaction to anesthesia    son had some problem years ago, pt unsure of what exact problem was   Hypertension    Obesity    Pneumonia     Past Surgical History:  Procedure Laterality Date    LEFT KNEE ARTHROCOPY     COLONOSCOPY WITH PROPOFOL Left 05/07/2020   Procedure: COLONOSCOPY WITH PROPOFOL;  Surgeon: Kathi Der, MD;  Location: [facility] ENDOSCOPY;  Service: Gastroenterology;  Laterality: Left;   COLONOSCOPY WITH PROPOFOL N/A 05/11/2020   Procedure: COLONOSCOPY WITH PROPOFOL;  Surgeon: Charlott Rakes, MD;  Location: [facility] ENDOSCOPY;  Service: Endoscopy;  Laterality: N/A;   ESOPHAGOGASTRODUODENOSCOPY N/A 05/06/2020   Procedure: ESOPHAGOGASTRODUODENOSCOPY (EGD);  Surgeon: Willis Modena, MD;  Location: [facility] ENDOSCOPY;   Service: Endoscopy;  Laterality: N/A;   HERNIA REPAIR     IR ANGIOGRAM SELECTIVE EACH ADDITIONAL VESSEL  05/11/2020   IR ANGIOGRAM VISCERAL SELECTIVE  05/11/2020   IR EMBO ART  VEN HEMORR LYMPH EXTRAV  INC GUIDE ROADMAPPING  05/11/2020   IR IVC FILTER PLMT / S&I /IMG GUID/MOD SED  05/11/2020   IR US GUIDE VASC ACCESS RIGHT  05/11/2020   IR VENOGRAM RENAL BI  05/11/2020   TRANSURETHRAL RESECTION OF PROSTATE N/A 12/26/2014   Procedure: TRANSURETHRAL RESECTION OF THE PROSTATE;  Surgeon: Malen Gauze, MD;  Location: [facility]S;  Service: Urology;  Laterality: N/A;    There were no vitals filed for this visit.   Subjective Assessment - 07/21/20 1040     Subjective Pt presents to PT with no current pain and has been compliant with his HEP with no adverse effect. He is ready to begin PT treatment at this time.    Currently in Pain? No/denies                               [facility] Adult PT Treatment/Exercise - 07/21/20 0001       Knee/Hip Exercises: Aerobic   Nustep L5 UE/LE x 5 min while taking subjective      Knee/Hip Exercises: Machines for Strengthening   Cybex Knee Extension 3x10 20lbs    Cybex Knee Flexion 3x10 35lb  Total Gym Leg Press 3x10 75lbs      Knee/Hip Exercises: Standing   Hip Abduction 2 sets;10 reps;Both    Abduction Limitations 3lbs    Hip Extension 2 sets;10 reps;Both    Extension Limitations 3lbs    Other Standing Knee Exercises walking march in // 1x UE x 2 3# - fwd and bkwd                      PT Short Term Goals - 07/16/20 1012       PT SHORT TERM GOAL #1   Title [name] will be >75% HEP compliant within 3 weeks to facilitate carryover between sessions and move towards eventual independent management of their condition.    Status Achieved    Target Date 06/27/20               PT Long Term Goals - 07/16/20 1012       PT LONG TERM GOAL #1   Title [name] will be able to resume walking program up to 1  mile 4x/week    Baseline walking about 5 min    Status On-going      PT LONG TERM GOAL #2   Title [name] will improve 30'' STS (MCID 2) to >/= 12x to show improved LE strength and improved transfers.    Baseline 6x; 07/16/20 - 6 reps    Status On-going      PT LONG TERM GOAL #3   Title [name] will be able to stand for >30''' in tandem stance, to show a significant improvement in balance in order to reduce fall risk    Baseline ~4'' - 15" 07/16/20    Status On-going      PT LONG TERM GOAL #4   Title [name] will improve gait speed to .6 m/s (.1 m/s MCID) to show functional improvement in ambulation    Baseline .27 over 30'    Status On-going                   Plan - 07/21/20 1112     Clinical Impression Statement Pt was once again able to complete prescribed exercises with no adverse effect. Today's session continued to focus on increasing LE strength and endurance. Pt continues to benefit from skilled PT services and continues to show improving strength and functional activity tolerance. Will continue to progress as able.    PT Treatment/Interventions ADLs/Self Care Home Management;Aquatic Therapy;Therapeutic exercise;Neuromuscular re-education;Gait training;Therapeutic activities;Manual techniques    PT Next Visit Plan work on LE strength, gait, balance    PT Home Exercise Plan [id number]             Patient will benefit from skilled therapeutic intervention in order to improve the following deficits and impairments:  Abnormal gait, Decreased endurance, Decreased activity tolerance, Decreased strength, Impaired UE functional use, Difficulty walking, Decreased balance  Visit Diagnosis: Muscle weakness  Other abnormalities of gait and mobility  Balance problem  History of 2019 novel coronavirus disease (COVID-19)  Muscle weakness (generalized)  Difficulty in walking, not elsewhere classified     Problem List Patient Active Problem  List   Diagnosis Date Noted   Hypomagnesemia 05/11/2020   Atrial fibrillation with RVR (HCC) 05/11/2020   GI bleed 05/05/2020   AKI (acute kidney injury) (HCC)    2019 novel coronavirus disease (COVID-19) 03/07/2020   Anemia 03/07/2020   Arthropathy 03/07/2020   Body mass  index (BMI) 32.0-32.9, adult 03/07/2020   Body mass index (BMI) 34.0-34.9, adult 03/07/2020   Chronic kidney disease (CKD) stage G3a/A1, moderately decreased glomerular filtration rate (GFR) between 45-59 mL/min/1.73 square meter and albuminuria creatinine ratio less than 30 mg/g (HCC) 03/07/2020   Chronic sinusitis 03/07/2020   Constipation 03/07/2020   Diabetic renal disease (HCC) 03/07/2020   Hardening of the aorta (main artery of the heart) (HCC) 03/07/2020   Hyperlipidemia 03/07/2020   Microscopic hematuria 03/07/2020   Nausea 03/07/2020   Other abnormalities of gait and mobility 03/07/2020   Personal history of colonic polyps 03/07/2020   Pneumonia due to SARS-associated coronavirus 03/07/2020   Primary open-angle glaucoma, right eye, mild stage 03/07/2020   Degeneration of lumbar intervertebral disc 03/07/2020   Radiculopathy due to lumbar intervertebral disc disorder 03/07/2020   Right lower quadrant pain 03/07/2020   S/P TURP (status post transurethral resection of prostate) 03/07/2020   Secondary hyperparathyroidism of renal origin (HCC) 03/07/2020   Senile purpura (HCC) 03/07/2020   Sequelae of other specified infectious and parasitic diseases 03/07/2020   Stricture of artery (HCC) 03/07/2020   Subclinical hypothyroidism 03/07/2020   History of COVID-19 04/11/2019   Shortness of breath 04/11/2019   Irregular heart rhythm 04/11/2019   Fatigue 04/11/2019   Generalized weakness 04/11/2019   Acute respiratory failure with hypoxia (HCC) 02/02/2019   Essential hypertension 02/02/2019   Type 2 diabetes mellitus with complication, without long-term current use of insulin (HCC) 02/02/2019   Hypokalemia  02/02/2019   BPH (benign prostatic hyperplasia) 12/26/2014   Eloy End, PT, DPT 07/21/20 11:24 AM  [facility] [address] [place], [place], [address] Phone: [phone]   Fax:  [phone]  Name: Thomas Joseph MRN: [id number] Date of Birth: January 23, 1933

## 2020-07-25 ENCOUNTER — Other Ambulatory Visit: Payer: Self-pay

## 2020-07-25 ENCOUNTER — Ambulatory Visit: Payer: Medicare Other

## 2020-07-25 DIAGNOSIS — R262 Difficulty in walking, not elsewhere classified: Secondary | ICD-10-CM

## 2020-07-25 DIAGNOSIS — M6281 Muscle weakness (generalized): Secondary | ICD-10-CM

## 2020-07-25 DIAGNOSIS — Z8616 Personal history of COVID-19: Secondary | ICD-10-CM | POA: Diagnosis not present

## 2020-07-25 DIAGNOSIS — R2689 Other abnormalities of gait and mobility: Secondary | ICD-10-CM | POA: Diagnosis not present

## 2020-07-25 NOTE — Therapy (Signed)
[facility] [address] [place], [place], [address] Phone: [phone]   Fax:  [phone]  Physical Therapy Treatment  Patient Details  Name: Thomas Joseph MRN: [id number] Date of Birth: [birth year] Referring Provider (PT): Rhetta Mura, MD   Encounter Date: 07/25/2020   PT End of Session - 07/25/20 1045     Visit Number 9    Number of Visits 16    Date for PT Re-Evaluation 08/01/20    Authorization Type MCR    Progress Note Due on Visit 10    PT Start Time 1045    PT Stop Time 1127    PT Time Calculation (min) 42 min    Equipment Utilized During Treatment Gait belt    Activity Tolerance Patient tolerated treatment well    Behavior During Therapy WFL for tasks assessed/performed             Past Medical History:  Diagnosis Date   Anemia    BPH (benign prostatic hyperplasia)    Chronic kidney disease (CKD) stage G3a/A1, moderately decreased glomerular filtration rate (GFR) between 45-59 mL/min/1.73 square meter and albuminuria creatinine ratio less than 30 mg/g (HCC) 03/07/2020   Diabetes mellitus without complication [facility])    ED (erectile dysfunction)    Family history of adverse reaction to anesthesia    son had some problem years ago, pt unsure of what exact problem was   Hypertension    Obesity    Pneumonia     Past Surgical History:  Procedure Laterality Date    LEFT KNEE ARTHROCOPY     COLONOSCOPY WITH PROPOFOL Left 05/07/2020   Procedure: COLONOSCOPY WITH PROPOFOL;  Surgeon: Kathi Der, MD;  Location: [facility] ENDOSCOPY;  Service: Gastroenterology;  Laterality: Left;   COLONOSCOPY WITH PROPOFOL N/A 05/11/2020   Procedure: COLONOSCOPY WITH PROPOFOL;  Surgeon: Charlott Rakes, MD;  Location: [facility] ENDOSCOPY;  Service: Endoscopy;  Laterality: N/A;   ESOPHAGOGASTRODUODENOSCOPY N/A 05/06/2020   Procedure: ESOPHAGOGASTRODUODENOSCOPY (EGD);  Surgeon: Willis Modena, MD;  Location: [facility] ENDOSCOPY;   Service: Endoscopy;  Laterality: N/A;   HERNIA REPAIR     IR ANGIOGRAM SELECTIVE EACH ADDITIONAL VESSEL  05/11/2020   IR ANGIOGRAM VISCERAL SELECTIVE  05/11/2020   IR EMBO ART  VEN HEMORR LYMPH EXTRAV  INC GUIDE ROADMAPPING  05/11/2020   IR IVC FILTER PLMT / S&I /IMG GUID/MOD SED  05/11/2020   IR US GUIDE VASC ACCESS RIGHT  05/11/2020   IR VENOGRAM RENAL BI  05/11/2020   TRANSURETHRAL RESECTION OF PROSTATE N/A 12/26/2014   Procedure: TRANSURETHRAL RESECTION OF THE PROSTATE;  Surgeon: Malen Gauze, MD;  Location: [facility];  Service: Urology;  Laterality: N/A;    There were no vitals filed for this visit.   Subjective Assessment - 07/25/20 1047     Subjective Pt presents to PT with no pain or discomfort. Continues to be compliant with HEP. Ready to begin treatment at this time.    Currently in Pain? No/denies                               [facility] Adult PT Treatment/Exercise - 07/25/20 0001       Knee/Hip Exercises: Aerobic   Nustep L5 UE/LE x 5 min while taking subjective      Knee/Hip Exercises: Machines for Strengthening   Cybex Knee Extension 3x10 25lbs    Cybex Knee Flexion 3x10 40lbs    Total Gym Leg Press 3x10  75lbs      Knee/Hip Exercises: Standing   Forward Step Up Limitations 2x10 3# single UE support    Other Standing Knee Exercises walking march in // 1x UE x 2 3# - fwd and bkwd      Knee/Hip Exercises: Seated   Sit to Sand 2 sets;10 reps;without UE support   holding 5lb DB                     PT Short Term Goals - 07/16/20 1012       PT SHORT TERM GOAL #1   Title Thomas Joseph will be >75% HEP compliant within 3 weeks to facilitate carryover between sessions and move towards eventual independent management of their condition.    Status Achieved    Target Date 06/27/20               PT Long Term Goals - 07/16/20 1012       PT LONG TERM GOAL #1   Title Thomas Joseph will be able to resume walking program up to 1 mile  4x/week    Baseline walking about 5 min    Status On-going      PT LONG TERM GOAL #2   Title Thomas Joseph will improve 30'' STS (MCID 2) to >/= 12x to show improved LE strength and improved transfers.    Baseline 6x; 07/16/20 - 6 reps    Status On-going      PT LONG TERM GOAL #3   Title Thomas Joseph will be able to stand for >30''' in tandem stance, to show a significant improvement in balance in order to reduce fall risk    Baseline ~4'' - 15" 07/16/20    Status On-going      PT LONG TERM GOAL #4   Title Thomas Joseph will improve gait speed to .6 m/s (.1 m/s MCID) to show functional improvement in ambulation    Baseline .27 over 30'    Status On-going                   Plan - 07/25/20 1128     Clinical Impression Statement Pt was once again able to complete prescribed exercises. Today;s session again focused on continued LE strengthening and improving balance/endurance. He continues to benefit from skilled PT with PN at next session.    PT Treatment/Interventions ADLs/Self Care Home Management;Aquatic Therapy;Therapeutic exercise;Neuromuscular re-education;Gait training;Therapeutic activities;Manual techniques    PT Next Visit Plan PN next session; work on LE strength, gait, balance    PT Home Exercise Plan [id number]             Patient will benefit from skilled therapeutic intervention in order to improve the following deficits and impairments:  Abnormal gait, Decreased endurance, Decreased activity tolerance, Decreased strength, Impaired UE functional use, Difficulty walking, Decreased balance  Visit Diagnosis: Muscle weakness  Other abnormalities of gait and mobility  Balance problem  History of 2019 novel coronavirus disease (COVID-19)  Muscle weakness (generalized)  Difficulty in walking, not elsewhere classified     Problem List Patient Active Problem List   Diagnosis Date Noted   Hypomagnesemia 05/11/2020   Atrial fibrillation with  RVR (HCC) 05/11/2020   GI bleed 05/05/2020   AKI (acute kidney injury) (HCC)    2019 novel coronavirus disease (COVID-19) 03/07/2020   Anemia 03/07/2020   Arthropathy 03/07/2020   Body mass index (BMI) 32.0-32.9, adult 03/07/2020   Body mass index (BMI) 34.0-34.9,  adult 03/07/2020   Chronic kidney disease (CKD) stage G3a/A1, moderately decreased glomerular filtration rate (GFR) between 45-59 mL/min/1.73 square meter and albuminuria creatinine ratio less than 30 mg/g (HCC) 03/07/2020   Chronic sinusitis 03/07/2020   Constipation 03/07/2020   Diabetic renal disease (HCC) 03/07/2020   Hardening of the aorta (main artery of the heart) (HCC) 03/07/2020   Hyperlipidemia 03/07/2020   Microscopic hematuria 03/07/2020   Nausea 03/07/2020   Other abnormalities of gait and mobility 03/07/2020   Personal history of colonic polyps 03/07/2020   Pneumonia due to SARS-associated coronavirus 03/07/2020   Primary open-angle glaucoma, right eye, mild stage 03/07/2020   Degeneration of lumbar intervertebral disc 03/07/2020   Radiculopathy due to lumbar intervertebral disc disorder 03/07/2020   Right lower quadrant pain 03/07/2020   S/P TURP (status post transurethral resection of prostate) 03/07/2020   Secondary hyperparathyroidism of renal origin (HCC) 03/07/2020   Senile purpura (HCC) 03/07/2020   Sequelae of other specified infectious and parasitic diseases 03/07/2020   Stricture of artery (HCC) 03/07/2020   Subclinical hypothyroidism 03/07/2020   History of COVID-19 04/11/2019   Shortness of breath 04/11/2019   Irregular heart rhythm 04/11/2019   Fatigue 04/11/2019   Generalized weakness 04/11/2019   Acute respiratory failure with hypoxia (HCC) 02/02/2019   Essential hypertension 02/02/2019   Type 2 diabetes mellitus with complication, without long-term current use of insulin (HCC) 02/02/2019   Hypokalemia 02/02/2019   BPH (benign prostatic hyperplasia) 12/26/2014    Eloy End, PT,  DPT 07/25/20 11:29 AM  [facility] [address] [place], [place], [address] Phone: [phone]   Fax:  [phone]  Name: Thomas Joseph MRN: [id number] Date of Birth: September 30, 1933

## 2020-07-28 ENCOUNTER — Other Ambulatory Visit: Payer: Self-pay

## 2020-07-28 ENCOUNTER — Ambulatory Visit: Payer: Medicare Other

## 2020-07-28 ENCOUNTER — Other Ambulatory Visit: Payer: Self-pay | Admitting: Diagnostic Radiology

## 2020-07-28 DIAGNOSIS — Z8616 Personal history of COVID-19: Secondary | ICD-10-CM | POA: Diagnosis not present

## 2020-07-28 DIAGNOSIS — R2689 Other abnormalities of gait and mobility: Secondary | ICD-10-CM

## 2020-07-28 DIAGNOSIS — R262 Difficulty in walking, not elsewhere classified: Secondary | ICD-10-CM | POA: Diagnosis not present

## 2020-07-28 DIAGNOSIS — Z95828 Presence of other vascular implants and grafts: Secondary | ICD-10-CM

## 2020-07-28 DIAGNOSIS — M6281 Muscle weakness (generalized): Secondary | ICD-10-CM

## 2020-07-28 NOTE — Therapy (Signed)
[facility] [address] , [place], [address] Phone: [phone]   Fax:  [phone]  Physical Therapy Treatment/Discharge  Patient Details  Name: Thomas Joseph MRN: [id number] Date of Birth: 07/12/1933 Referring Provider (PT): Samtani, Jai-Gurmukh, MD   Encounter Date: 07/28/2020   PT End of Session - 07/28/20 1050     Visit Number 10    Number of Visits 16    Date for PT Re-Evaluation 08/01/20    Authorization Type MCR    Progress Note Due on Visit 10    PT Start Time 1046    PT Stop Time 1127    PT Time Calculation (min) 41 min    Equipment Utilized During Treatment Gait belt    Activity Tolerance Patient tolerated treatment well    Behavior During Therapy WFL for tasks assessed/performed             Past Medical History:  Diagnosis Date   Anemia    BPH (benign prostatic hyperplasia)    Chronic kidney disease (CKD) stage G3a/A1, moderately decreased glomerular filtration rate (GFR) between 45-59 mL/min/1.73 square meter and albuminuria creatinine ratio less than 30 mg/g (HCC) 03/07/2020   Diabetes mellitus without complication (HCC)    ED (erectile dysfunction)    Family history of adverse reaction to anesthesia    son had some problem years ago, pt unsure of what exact problem was   Hypertension    Obesity    Pneumonia     Past Surgical History:  Procedure Laterality Date    LEFT KNEE ARTHROCOPY     COLONOSCOPY WITH PROPOFOL Left 05/07/2020   Procedure: COLONOSCOPY WITH PROPOFOL;  Surgeon: Brahmbhatt, Parag, MD;  Location: [facility];  Service: Gastroenterology;  Laterality: Left;   COLONOSCOPY WITH PROPOFOL N/A 05/11/2020   Procedure: COLONOSCOPY WITH PROPOFOL;  Surgeon: Schooler, Vincent, MD;  Location: [facility] ENDOS[facility];  Service: Endoscopy;  Laterality: N/A;   ESOPHAGOGASTRODUODENOSCOPY N/A 05/06/2020   Procedure: ESOPHAGOGASTRODUODENOSCOPY (EGD);  Surgeon: Outlaw, William, MD;  Location: [facility]  ENDOSCOPY;  Service: Endoscopy;  Laterality: N/A;   HERNIA REPAIR     IR ANGIOGRAM SELECTIVE EACH ADDITIONAL VESSEL  05/11/2020   IR ANGIOGRAM VISCERAL SELECTIVE  05/11/2020   IR EMBO ART  VEN HEMORR LYMPH EXTRAV  INC GUIDE ROADMAPPING  05/11/2020   IR IVC FILTER PLMT / S&I /IMG GUID/MOD SED  05/11/2020   IR US GUIDE VASC ACCESS RIGHT  05/11/2020   IR VENOGRAM RENAL BI  05/11/2020   TRANSURETHRAL RESECTION OF PROSTATE N/A 12/26/2014   Procedure: TRANSURETHRAL RESECTION OF THE PROSTATE;  Surgeon: Patrick L McKenzie, MD;  Location: [facility];  Service: Urology;  Laterality: N/A;    There were no vitals filed for this visit.   Subjective Assessment - 07/28/20 1051     Subjective Pt presents to PT with some reports of LE muscle soreness. He continues to be compliant with his HEP with no adverse effect. Pt is ready to begin PT treatment at this time.    Currently in Pain? No/denies                [facility] PT Assessment - 07/28/20 0001       Observation/Other Assessments   Observations Gait speed: .27 over 30'      Transfers   Comments 30'' STS: 9x      High Level Balance   High Level Balance Comments tandem stance in // 17" before UE support needed                             [facility] Adult PT Treatment/Exercise - 07/28/20 0001       Knee/Hip Exercises: Aerobic   Nustep L5 UE/LE x 5 min while taking subjective      Knee/Hip Exercises: Machines for Strengthening   Cybex Knee Extension 3x10 25lbs    Cybex Knee Flexion 3x10 40lbs    Total Gym Leg Press 3x10 75lbs      Knee/Hip Exercises: Standing   Abduction Limitations 2x10 3lb    Extension Limitations 2x10 3lb    Other Standing Knee Exercises walking march in // 1x UE x 1 3# - fwd and bkwd                    PT Education - 07/28/20 1138     Education Details HEP, walking program    Person(s) Educated Patient    Methods Explanation;Demonstration;Handout    Comprehension Verbalized understanding;Returned  demonstration              PT Short Term Goals - 07/16/20 1012       PT SHORT TERM GOAL #1   Title [name] will be >75% HEP compliant within 3 weeks to facilitate carryover between sessions and move towards eventual independent management of their condition.    Status Achieved    Target Date 06/27/20               PT Long Term Goals - 07/28/20 1135       PT LONG TERM GOAL #1   Title [name] will be able to resume walking program up to 1 mile 4x/week    Baseline walking about 5 min; update on 7/18: 1/2 mile    Status Partially Met      PT LONG TERM GOAL #2   Title [name] will improve 30'' STS (MCID 2) to >/= 12x to show improved LE strength and improved transfers.    Baseline 6x; 07/16/20 - 6 reps; update 7/18: 9 reps    Status Partially Met      PT LONG TERM GOAL #3   Title [name] will be able to stand for >30''' in tandem stance, to show a significant improvement in balance in order to reduce fall risk    Baseline ~4'' - 15" 07/16/20; update 7/18: 17"    Status Partially Met      PT LONG TERM GOAL #4   Title [name] will improve gait speed to .6 m/s (.1 m/s MCID) to show functional improvement in ambulation    Baseline .27 over 30'; update: no change    Status Not Met                   Plan - 07/28/20 1138     Clinical Impression Statement Pt was once again able to complete prescribed exercises and demonstrated knowledge of HEP with no adverse effect. Over the course of PT treatment, pt has demonstrated improvement in LE strength and functional mobility, as evidenced by increase 30 Sec Chair Rise rep count and improvement in tandem stance balancing. He continues to increase walking program distance to reported increase of up to .5 mile at present. While he did not meet all stated goals he continues to improve with HEP compliance and is pleased with current functional ability. PT encouraged pt to conitnue to  listen for shuffling gait pattern and increasing step length, as he continues to have shortened stride length and slowed gait speed. Pt is discharging at this  time with HEP in place for continued improvement.    PT Treatment/Interventions ADLs/Self Care Home Management;Aquatic Therapy;Therapeutic exercise;Neuromuscular re-education;Gait training;Therapeutic activities;Manual techniques    PT Home Exercise Plan [id number]    Consulted and Agree with Plan of Care Patient             Patient will benefit from skilled therapeutic intervention in order to improve the following deficits and impairments:  Abnormal gait, Decreased endurance, Decreased activity tolerance, Decreased strength, Impaired UE functional use, Difficulty walking, Decreased balance  Visit Diagnosis: Muscle weakness  Other abnormalities of gait and mobility  Balance problem  History of 2019 novel coronavirus disease (COVID-19)  Muscle weakness (generalized)  Difficulty in walking, not elsewhere classified     Problem List Patient Active Problem List   Diagnosis Date Noted   Hypomagnesemia 05/11/2020   Atrial fibrillation with RVR ([place]) 05/11/2020   GI bleed 05/05/2020   AKI (acute kidney injury) ([place])    2019 novel coronavirus disease (COVID-19) 03/07/2020   Anemia 03/07/2020   Arthropathy 03/07/2020   Body mass index (BMI) 32.0-32.9, adult 03/07/2020   Body mass index (BMI) 34.0-34.9, adult 03/07/2020   Chronic kidney disease (CKD) stage G3a/A1, moderately decreased glomerular filtration rate (GFR) between 45-59 mL/min/1.73 square meter and albuminuria creatinine ratio less than 30 mg/g (HCC) 03/07/2020   Chronic sinusitis 03/07/2020   Constipation 03/07/2020   Diabetic renal disease ([place]) 03/07/2020   Hardening of the aorta (main artery of the heart) ([place]) 03/07/2020   Hyperlipidemia 03/07/2020   Microscopic hematuria 03/07/2020   Nausea 03/07/2020   Other abnormalities of gait and mobility 03/07/2020    Personal history of colonic polyps 03/07/2020   Pneumonia due to SARS-associated coronavirus 03/07/2020   Primary open-angle glaucoma, right eye, mild stage 03/07/2020   Degeneration of lumbar intervertebral disc 03/07/2020   Radiculopathy due to lumbar intervertebral disc disorder 03/07/2020   Right lower quadrant pain 03/07/2020   S/P TURP (status post transurethral resection of prostate) 03/07/2020   Secondary hyperparathyroidism of renal origin ([place]) 03/07/2020   Senile purpura ([place]) 03/07/2020   Sequelae of other specified infectious and parasitic diseases 03/07/2020   Stricture of artery ([place]) 03/07/2020   Subclinical hypothyroidism 03/07/2020   History of COVID-19 04/11/2019   Shortness of breath 04/11/2019   Irregular heart rhythm 04/11/2019   Fatigue 04/11/2019   Generalized weakness 04/11/2019   Acute respiratory failure with hypoxia ([place]) 02/02/2019   Essential hypertension 02/02/2019   Type 2 diabetes mellitus with complication, without long-term current use of insulin ([place]) 02/02/2019   Hypokalemia 02/02/2019   BPH (benign prostatic hyperplasia) 12/26/2014    Ward Chatters, PT, DPT 07/28/20 11:42 AM  [facility] [address] [place], [place], [address] Phone: [phone]   Fax:  [phone]  Name: MAKAEL STEIN MRN: [id number] Date of Birth: [birth year]  PHYSICAL THERAPY DISCHARGE SUMMARY  Visits from Start of Care: 10  Current functional level related to goals / functional outcomes: See goals and objective   Remaining deficits: See goals and objective   Education / Equipment: HEP, walking program   Patient agrees to discharge. Patient goals were partially met. Patient is being discharged due to being pleased with the current functional level.

## 2020-08-11 ENCOUNTER — Encounter: Payer: Self-pay | Admitting: Podiatry

## 2020-08-11 ENCOUNTER — Ambulatory Visit: Payer: Medicare Other | Admitting: Podiatry

## 2020-08-11 ENCOUNTER — Other Ambulatory Visit: Payer: Self-pay

## 2020-08-11 DIAGNOSIS — E1151 Type 2 diabetes mellitus with diabetic peripheral angiopathy without gangrene: Secondary | ICD-10-CM | POA: Diagnosis not present

## 2020-08-11 DIAGNOSIS — B351 Tinea unguium: Secondary | ICD-10-CM | POA: Diagnosis not present

## 2020-08-11 DIAGNOSIS — M79675 Pain in left toe(s): Secondary | ICD-10-CM

## 2020-08-11 DIAGNOSIS — M79674 Pain in right toe(s): Secondary | ICD-10-CM

## 2020-08-11 NOTE — Progress Notes (Signed)
  Subjective:  Patient ID: Thomas Joseph, male    DOB: [birth year],  MRN: [id number]  Thomas Joseph presents to clinic today for at risk foot care. Pt has h/o NIDDM with PAD and painful thick toenails that are difficult to trim. Pain interferes with ambulation. Aggravating factors include wearing enclosed shoe gear. Pain is relieved with periodic professional debridement.  Patient did not check blood glucose today.  PCP is Mila Palmer, MD , and last visit was 04/04/2020.  Allergies  Allergen Reactions   Hydrochlorothiazide Other (See Comments)    Reaction not recalled, believes it just made him urinate very frequently   Lipitor [Atorvastatin] Other (See Comments)    Muscle pain   Tizanidine Hcl Other (See Comments)   Viagra [Sildenafil Citrate] Palpitations    Review of Systems: Negative except as noted in the HPI. Objective:   Constitutional Thomas Joseph is a pleasant 87 y.o. African American male, WD, WN in NAD. AAO x 3.   Vascular Capillary fill time to digits <3 seconds b/l lower extremities. Palpable DP pulse(s) b/l lower extremities Faintly palpable PT pulse(s) b/l lower extremities. Pedal hair present. Lower extremity skin temperature gradient within normal limits. No pain with calf compression b/l. Nonpitting edema noted bilateral ankles. No cyanosis or clubbing noted.  Neurologic Normal speech. Oriented to person, place, and time. Protective sensation intact 5/5 intact bilaterally with 10g monofilament b/l. Vibratory sensation intact b/l.  Dermatologic Pedal skin with normal turgor, texture and tone b/l lower extremities. No open wounds b/l lower extremities. No interdigital macerations b/l lower extremities. Toenails L hallux, L 3rd toe, L 4th toe, L 5th toe, R hallux, R 3rd toe, R 4th toe, and R 5th toe elongated, discolored, dystrophic, thickened, and crumbly with subungual debris and tenderness to dorsal palpation. Anonychia noted L 2nd toe and R 2nd toe.  Nailbed(s) epithelialized.   Orthopedic: Normal muscle strength 5/5 to all lower extremity muscle groups bilaterally. Hallux valgus with bunion deformity noted b/l lower extremities.   Radiographs: None Assessment:   1. Pain due to onychomycosis of toenails of both feet   2. Type II diabetes mellitus with peripheral circulatory disorder (HCC)    Plan:  -Continue diabetic foot care principles. -Patient to continue soft, supportive shoe gear daily. -Toenails L hallux, L 3rd toe, L 4th toe, L 5th toe, R hallux, R 3rd toe, R 4th toe, and R 5th toe debrided in length and girth without iatrogenic bleeding with sterile nail nipper and dremel.  -Patient to report any pedal injuries to medical professional immediately. -Patient/POA to call should there be question/concern in the interim.  Return in about 3 months (around 11/11/2020).  [name], DPM

## 2020-08-27 ENCOUNTER — Ambulatory Visit
Admission: RE | Admit: 2020-08-27 | Discharge: 2020-08-27 | Disposition: A | Discharge status: Home / Self Care | Payer: Medicare Other | Source: Ambulatory Visit | Attending: Diagnostic Radiology | Admitting: Diagnostic Radiology

## 2020-08-27 DIAGNOSIS — Z4589 Encounter for adjustment and management of other implanted devices: Secondary | ICD-10-CM | POA: Diagnosis not present

## 2020-08-27 DIAGNOSIS — Z95828 Presence of other vascular implants and grafts: Secondary | ICD-10-CM

## 2020-08-27 DIAGNOSIS — Z86718 Personal history of other venous thrombosis and embolism: Secondary | ICD-10-CM | POA: Diagnosis not present

## 2020-08-27 DIAGNOSIS — Z86711 Personal history of pulmonary embolism: Secondary | ICD-10-CM | POA: Diagnosis not present

## 2020-08-27 HISTORY — PX: IR RADIOLOGIST EVAL & MGMT: IMG5224

## 2020-08-27 NOTE — Progress Notes (Signed)
Chief Complaint: Patient was seen in consultation today for IVC filter management and follow-up GI bleeding.  Referring Physician(s): Mila Palmer  History of Present Illness: Thomas Joseph is a 87 y.o. male with history of lower GI bleed, lower extremity DVT and pulmonary embolism.  Patient was hospitalized from 05/05/20-05/20/20 for lower GI bleeding management.  CTA demonstrated active bleeding in the right colon and incidentally noted were pulmonary emboli at the lung bases.  Patient underwent a mesenteric angiography and embolization of a bleeding right colic artery branch.  In addition, an IVC filter was placed at the same time as the mesenteric embolization procedure.  Patient presents for follow-up management of his IVC filter.  Patient is accompanied by his wife.  He says that he is slowly improving since his hospitalization.  He is active and moving around.  He complains of his legs giving out.  Patient is not currently anticoagulated.  No significant abdominal symptoms but patient does have chronic problems with constipation.  Past Medical History:  Diagnosis Date   Anemia    BPH (benign prostatic hyperplasia)    Chronic kidney disease (CKD) stage G3a/A1, moderately decreased glomerular filtration rate (GFR) between 45-59 mL/min/1.73 square meter and albuminuria creatinine ratio less than 30 mg/g (HCC) 03/07/2020   Diabetes mellitus without complication [facility])    ED (erectile dysfunction)    Family history of adverse reaction to anesthesia    son had some problem years ago, pt unsure of what exact problem was   Hypertension    Obesity    Pneumonia     Past Surgical History:  Procedure Laterality Date    LEFT KNEE ARTHROCOPY     COLONOSCOPY WITH PROPOFOL Left 05/07/2020   Procedure: COLONOSCOPY WITH PROPOFOL;  Surgeon: Kathi Der, MD;  [facility]: [facility];  Service: Gastroenterology;  Laterality: Left;   COLONOSCOPY WITH PROPOFOL N/A 05/11/2020   Procedure:  COLONOSCOPY WITH PROPOFOL;  Surgeon: Charlott Rakes, MD;  Location: [facility] ENDOSCOPY;  Service: Endoscopy;  Laterality: N/A;   ESOPHAGOGASTRODUODENOSCOPY N/A 05/06/2020   Procedure: ESOPHAGOGASTRODUODENOSCOPY (EGD);  Surgeon: Willis Modena, MD;  Location: [facility] ENDOSCOPY;  Service: Endoscopy;  Laterality: N/A;   HERNIA REPAIR     IR ANGIOGRAM SELECTIVE EACH ADDITIONAL VESSEL  05/11/2020   IR ANGIOGRAM VISCERAL SELECTIVE  05/11/2020   IR EMBO ART  VEN HEMORR LYMPH EXTRAV  INC GUIDE ROADMAPPING  05/11/2020   IR IVC FILTER PLMT / S&I /IMG GUID/MOD SED  05/11/2020   IR US GUIDE VASC ACCESS RIGHT  05/11/2020   IR VENOGRAM RENAL BI  05/11/2020   TRANSURETHRAL RESECTION OF PROSTATE N/A 12/26/2014   Procedure: TRANSURETHRAL RESECTION OF THE PROSTATE;  Surgeon: Malen Gauze, MD;  Location: [facility];  Service: Urology;  Laterality: N/A;    Allergies: Hydrochlorothiazide, Lipitor [atorvastatin], Tizanidine hcl, and Viagra [sildenafil citrate]  Medications: Prior to Admission medications   Medication Sig Start Date End Date Taking? Authorizing Provider  acetaminophen (TYLENOL) 325 MG tablet Take 325-650 mg by mouth every 12 (twelve) hours as needed for mild pain or headache.    [provider]  amLODipine (NORVASC) 10 MG tablet Take 10 mg by mouth daily.    [provider]  Ascorbic Acid (VITAMIN C) 500 MG CAPS Take 1 tablet by mouth daily. 02/08/19   [provider]  Blood Pressure Monitor DEVI  10/23/19   [provider]  cetirizine (ZYRTEC) 10 MG tablet Take 10 mg by mouth daily as needed for allergies.  [provider]  Cyanocobalamin (VITAMIN B-12) 500 MCG TABS Take 500 mcg by mouth daily. 05/20/20   Joseph Art, DO  Iron-FA-B Cmp-C-Biot-Probiotic (FUSION PLUS) CAPS Take 1 capsule by mouth daily. 06/26/20   [provider]  latanoprost (XALATAN) 0.005 % ophthalmic solution Place 1 drop into both eyes at bedtime. 01/19/19   [provider]   lisinopril-hydrochlorothiazide (ZESTORETIC) 20-12.5 MG tablet Take 1 tablet by mouth daily. 02/15/19   [provider]  metFORMIN (GLUCOPHAGE-XR) 500 MG 24 hr tablet Take 2 tablets by mouth daily. 07/03/20   [provider]  polyethylene glycol (MIRALAX / GLYCOLAX) 17 g packet Take 17 g by mouth daily. 05/21/20   Joseph Art, DO  rosuvastatin (CRESTOR) 5 MG tablet Take 5 mg by mouth daily. 08/26/19   [provider]  zinc sulfate 220 (50 Zn) MG capsule Take 220 mg by mouth daily. 02/08/19   [provider]  benazepril (LOTENSIN) 40 MG tablet Take 40 mg by mouth daily.  02/02/19  [provider]  zolpidem (AMBIEN) 10 MG tablet Take 10 mg by mouth at bedtime as needed for sleep.   02/02/19  [provider]     Family History  Problem Relation Age of Onset   CVA Father    Hypertension Father    Leukemia Sister    Thyroid disease Sister    Cancer Brother     Social History   Socioeconomic History   Marital status: Married    Spouse name: Not on file   Number of children: Not on file   Years of education: Not on file   Highest education level: Not on file  Occupational History   Not on file  Tobacco Use   Smoking status: Former    Types: Cigarettes    Quit date: 11/28/1984    Years since quitting: 35.7   Smokeless tobacco: Never  Substance and Sexual Activity   Alcohol use: Not Currently    Comment: occasional glass of wine   Drug use: No   Sexual activity: Not on file  Other Topics Concern   Not on file  Social History Narrative   Not on file   Social Determinants of Health   Financial Resource Strain: Not on file  Food Insecurity: Not on file  Transportation Needs: Not on file  Physical Activity: Not on file  Stress: Not on file  Social Connections: Not on file    Review of Systems  Constitutional: Negative.   Gastrointestinal:  Positive for constipation.   Vital Signs: There were no vitals taken for this  visit.  Physical Exam Pulmonary:     Effort: Pulmonary effort is normal.  Neurological:     Mental Status: He is alert.    Imaging: US Venous Img Lower Bilateral (DVT)  Result Date: 08/27/2020 CLINICAL DATA:  87 year old with history of lower extremity DVT and pulmonary embolism. Patient has an IVC filter in place. EXAM: BILATERAL LOWER EXTREMITY VENOUS DOPPLER ULTRASOUND TECHNIQUE: Gray-scale sonography with graded compression, as well as color Doppler and duplex ultrasound were performed to evaluate the lower extremity deep venous systems from the level of the common femoral vein and including the common femoral, femoral, profunda femoral, popliteal and calf veins including the posterior tibial, peroneal and gastrocnemius veins when visible. The superficial great saphenous vein was also interrogated. Spectral Doppler was utilized to evaluate flow at rest and with distal augmentation maneuvers in the common femoral, femoral and popliteal veins. COMPARISON:  None. FINDINGS:  RIGHT LOWER EXTREMITY Common Femoral Vein: No evidence of thrombus. Normal compressibility, respiratory phasicity and response to augmentation. Saphenofemoral Junction: No evidence of thrombus. Normal compressibility and flow on color Doppler imaging. Profunda Femoral Vein: No evidence of thrombus. Normal compressibility and flow on color Doppler imaging. Femoral Vein: No evidence of thrombus. Normal compressibility, respiratory phasicity and response to augmentation. Popliteal Vein: No evidence of thrombus. Normal compressibility, respiratory phasicity and response to augmentation. Calf Veins: Visualized right deep calf veins are patent without thrombus. Superficial Great Saphenous Vein: No evidence of thrombus. Normal compressibility. Venous Reflux:  None. Other Findings:  None. LEFT LOWER EXTREMITY Common Femoral Vein: No evidence of thrombus. Normal compressibility, respiratory phasicity and response to augmentation.  Saphenofemoral Junction: No evidence of thrombus. Normal compressibility and flow on color Doppler imaging. Profunda Femoral Vein: No evidence of thrombus. Normal compressibility and flow on color Doppler imaging. Femoral Vein: No evidence of thrombus. Normal compressibility, respiratory phasicity and response to augmentation. Popliteal Vein: No evidence of thrombus. Normal compressibility, respiratory phasicity and response to augmentation. Calf Veins: Visualized left deep calf veins are patent without thrombus. Superficial Great Saphenous Vein: No evidence of thrombus. Normal compressibility. Venous Reflux:  None. Other Findings:  None. IMPRESSION: No evidence of deep venous thrombosis in either lower extremity. Electronically Signed   By: Richarda Overlie M.D.   On: 08/27/2020 16:52    Labs:  CBC: Recent Labs    05/18/20 0253 05/18/20 1852 05/19/20 0327 05/20/20 0320 05/26/20 1240  WBC 7.0  --  7.9 7.4 7.6  HGB 7.8* 8.3* 7.7* 7.8* 9.9*  HCT 24.3* 26.0* 24.4* 24.5* 32.0*  PLT 201  --  223 247 293    COAGS: Recent Labs    05/05/20 1155  INR 1.1    BMP: Recent Labs    05/17/20 0328 05/18/20 0253 05/19/20 0327 05/26/20 1240  NA 136 137 136 136  K 3.8 3.9 3.6 5.6*  CL 104 107 107 106  CO2 27 26 26 23   GLUCOSE 165* 164* 133* 112*  BUN 12 10 10 17   CALCIUM 8.0* 7.9* 7.9* 8.6*  CREATININE 1.33* 1.39* 1.40* 1.49*  GFRNONAA 53* 50* 50* 46*    LIVER FUNCTION TESTS: Recent Labs    05/13/20 0520 05/14/20 0252 05/15/20 0340 05/26/20 1240  BILITOT 0.7 0.7 0.6 1.4*  AST 18 43* 35 46*  ALT 11 17 17 14   ALKPHOS 34* 46 54 97  PROT 3.5* 3.8* 3.7* 5.6*  ALBUMIN 2.0* 2.1* 2.0* 2.8*    TUMOR MARKERS: No results for input(s): AFPTM, CEA, CA199, CHROMGRNA in the last 8760 hours.  Assessment and Plan:  87 year old with history of lower GI bleed and status post successful endovascular embolization.  At the time of the GI bleeding, the patient was also noted to have pulmonary emboli  and lower extremity venous duplex demonstrated age-indeterminate bilateral calf DVT.  IVC filter was placed at the same time as the GI bleed embolization procedure.  I reviewed the patient's CTA imaging and procedure imaging with the patient and his wife.  We had a long discussion about IVC filters.  We discussed the indications for IVC filters and the possible complications with keeping IVC filter long-term.  Patient had a lower extremity venous duplex today and there is no evidence for lower extremity DVT at this time.  At this point, it's not clear if the patient needs an IVC filter long-term.  Patient is not anticoagulated.  We discussed the possible risks of keeping a filter permanently  which could include increased risk of DVT, filter migration and filter fracture.  Based on patient's age and comorbidities, I think it is reasonable to keep the filter permanently and avoid another procedure.  Again, its not clear if the patient's lower extremity DVT and pulmonary embolism were unprovoked.  I believe that the patient and his wife have a very good understanding of the IVC filter at this time.  We will plan for a follow-up visit in 6 months and try to make a decision whether or not we would consider retrieving this filter or declaring it permanent.   Electronically Signed: Arn Medaldam R Litisha Guagliardo 08/27/2020, 5:22 PM   I spent a total of    25 Minutes in face to face in clinical consultation, greater than 50% of which was counseling/coordinating care for IVC filter management. patient ID: Thomas Joseph, male   DOB: Jun 10, 1933, 87 y.o.   MRN: [id number]

## 2020-10-22 DIAGNOSIS — I771 Stricture of artery: Secondary | ICD-10-CM | POA: Diagnosis not present

## 2020-10-22 DIAGNOSIS — Z7984 Long term (current) use of oral hypoglycemic drugs: Secondary | ICD-10-CM | POA: Diagnosis not present

## 2020-10-22 DIAGNOSIS — E1122 Type 2 diabetes mellitus with diabetic chronic kidney disease: Secondary | ICD-10-CM | POA: Diagnosis not present

## 2020-10-22 DIAGNOSIS — N1831 Chronic kidney disease, stage 3a: Secondary | ICD-10-CM | POA: Diagnosis not present

## 2020-10-22 DIAGNOSIS — Z79899 Other long term (current) drug therapy: Secondary | ICD-10-CM | POA: Diagnosis not present

## 2020-10-22 DIAGNOSIS — M353 Polymyalgia rheumatica: Secondary | ICD-10-CM | POA: Diagnosis not present

## 2020-10-22 DIAGNOSIS — E441 Mild protein-calorie malnutrition: Secondary | ICD-10-CM | POA: Diagnosis not present

## 2020-10-22 DIAGNOSIS — R5382 Chronic fatigue, unspecified: Secondary | ICD-10-CM | POA: Diagnosis not present

## 2020-10-22 DIAGNOSIS — E785 Hyperlipidemia, unspecified: Secondary | ICD-10-CM | POA: Diagnosis not present

## 2020-10-22 DIAGNOSIS — Z8719 Personal history of other diseases of the digestive system: Secondary | ICD-10-CM | POA: Diagnosis not present

## 2020-10-22 DIAGNOSIS — Z0000 Encounter for general adult medical examination without abnormal findings: Secondary | ICD-10-CM | POA: Diagnosis not present

## 2020-10-22 DIAGNOSIS — N2581 Secondary hyperparathyroidism of renal origin: Secondary | ICD-10-CM | POA: Diagnosis not present

## 2020-10-22 DIAGNOSIS — I10 Essential (primary) hypertension: Secondary | ICD-10-CM | POA: Diagnosis not present

## 2020-10-22 DIAGNOSIS — B948 Sequelae of other specified infectious and parasitic diseases: Secondary | ICD-10-CM | POA: Diagnosis not present

## 2020-10-22 DIAGNOSIS — I700 Atherosclerosis of aorta: Secondary | ICD-10-CM | POA: Diagnosis not present

## 2020-10-22 DIAGNOSIS — I480 Paroxysmal atrial fibrillation: Secondary | ICD-10-CM | POA: Diagnosis not present

## 2020-10-22 DIAGNOSIS — E038 Other specified hypothyroidism: Secondary | ICD-10-CM | POA: Diagnosis not present

## 2020-11-07 DIAGNOSIS — E119 Type 2 diabetes mellitus without complications: Secondary | ICD-10-CM | POA: Diagnosis not present

## 2020-11-13 DIAGNOSIS — D500 Iron deficiency anemia secondary to blood loss (chronic): Secondary | ICD-10-CM | POA: Diagnosis not present

## 2020-11-13 DIAGNOSIS — E538 Deficiency of other specified B group vitamins: Secondary | ICD-10-CM | POA: Diagnosis not present

## 2020-11-13 DIAGNOSIS — M353 Polymyalgia rheumatica: Secondary | ICD-10-CM | POA: Diagnosis not present

## 2020-11-13 DIAGNOSIS — N183 Chronic kidney disease, stage 3 unspecified: Secondary | ICD-10-CM | POA: Diagnosis not present

## 2020-11-13 DIAGNOSIS — I10 Essential (primary) hypertension: Secondary | ICD-10-CM | POA: Diagnosis not present

## 2020-11-17 ENCOUNTER — Other Ambulatory Visit: Payer: Self-pay

## 2020-11-17 ENCOUNTER — Ambulatory Visit: Payer: Medicare Other | Admitting: Podiatry

## 2020-11-17 ENCOUNTER — Encounter: Payer: Self-pay | Admitting: Podiatry

## 2020-11-17 DIAGNOSIS — M79674 Pain in right toe(s): Secondary | ICD-10-CM

## 2020-11-17 DIAGNOSIS — M2012 Hallux valgus (acquired), left foot: Secondary | ICD-10-CM | POA: Diagnosis not present

## 2020-11-17 DIAGNOSIS — E119 Type 2 diabetes mellitus without complications: Secondary | ICD-10-CM | POA: Diagnosis not present

## 2020-11-17 DIAGNOSIS — B351 Tinea unguium: Secondary | ICD-10-CM | POA: Diagnosis not present

## 2020-11-17 DIAGNOSIS — M2011 Hallux valgus (acquired), right foot: Secondary | ICD-10-CM

## 2020-11-17 DIAGNOSIS — M79675 Pain in left toe(s): Secondary | ICD-10-CM

## 2020-11-17 DIAGNOSIS — E1151 Type 2 diabetes mellitus with diabetic peripheral angiopathy without gangrene: Secondary | ICD-10-CM

## 2020-11-17 DIAGNOSIS — R600 Localized edema: Secondary | ICD-10-CM

## 2020-11-17 DIAGNOSIS — M898X7 Other specified disorders of bone, ankle and foot: Secondary | ICD-10-CM

## 2020-11-17 NOTE — Progress Notes (Signed)
ANNUAL DIABETIC FOOT EXAM  Subjective: Thomas Joseph presents today for for annual diabetic foot examination and thick, elongated toenails both feet which are tender when wearing enclosed shoe gear..  Patient relates 44 year h/o diabetes.  Patient denies any h/o foot wounds.  Patient denies any symptoms of foot numbness.  Patient denies any symptoms of foot tingling.  Patient denies any symptoms of burning in feet.  Patient denies any symptoms of pins/needles in feet.  Patient's blood sugar was 115 mg/dl this morning.   Mila Palmer, MD is patient's PCP. Last visit was 06/27/2020.  Past Medical History:  Diagnosis Date   Anemia    BPH (benign prostatic hyperplasia)    Chronic kidney disease (CKD) stage G3a/A1, moderately decreased glomerular filtration rate (GFR) between 45-59 mL/min/1.73 square meter and albuminuria creatinine ratio less than 30 mg/g (HCC) 03/07/2020   Diabetes mellitus without complication [facility])    ED (erectile dysfunction)    Family history of adverse reaction to anesthesia    son had some problem years ago, pt unsure of what exact problem was   Hypertension    Obesity    Pneumonia    Patient Active Problem List   Diagnosis Date Noted   Hypomagnesemia 05/11/2020   Atrial fibrillation with RVR (HCC) 05/11/2020   GI bleed 05/05/2020   AKI (acute kidney injury) (HCC)    2019 novel coronavirus disease (COVID-19) 03/07/2020   Anemia 03/07/2020   Arthropathy 03/07/2020   Body mass index (BMI) 32.0-32.9, adult 03/07/2020   Body mass index (BMI) 34.0-34.9, adult 03/07/2020   Chronic kidney disease (CKD) stage G3a/A1, moderately decreased glomerular filtration rate (GFR) between 45-59 mL/min/1.73 square meter and albuminuria creatinine ratio less than 30 mg/g (HCC) 03/07/2020   Chronic sinusitis 03/07/2020   Constipation 03/07/2020   Diabetic renal disease (HCC) 03/07/2020   Hardening of the aorta (main artery of the heart) (HCC) 03/07/2020    Hyperlipidemia 03/07/2020   Microscopic hematuria 03/07/2020   Nausea 03/07/2020   Other abnormalities of gait and mobility 03/07/2020   Personal history of colonic polyps 03/07/2020   Pneumonia due to SARS-associated coronavirus 03/07/2020   Primary open-angle glaucoma, right eye, mild stage 03/07/2020   Degeneration of lumbar intervertebral disc 03/07/2020   Radiculopathy due to lumbar intervertebral disc disorder 03/07/2020   Right lower quadrant pain 03/07/2020   S/P TURP (status post transurethral resection of prostate) 03/07/2020   Secondary hyperparathyroidism of renal origin (HCC) 03/07/2020   Senile purpura (HCC) 03/07/2020   Sequelae of other specified infectious and parasitic diseases 03/07/2020   Stricture of artery (HCC) 03/07/2020   Subclinical hypothyroidism 03/07/2020   History of COVID-19 04/11/2019   Shortness of breath 04/11/2019   Irregular heart rhythm 04/11/2019   Fatigue 04/11/2019   Generalized weakness 04/11/2019   Acute respiratory failure with hypoxia (HCC) 02/02/2019   Essential hypertension 02/02/2019   Type 2 diabetes mellitus with complication, without long-term current use of insulin (HCC) 02/02/2019   Hypokalemia 02/02/2019   BPH (benign prostatic hyperplasia) 12/26/2014   Past Surgical History:  Procedure Laterality Date    LEFT KNEE ARTHROCOPY     COLONOSCOPY WITH PROPOFOL Left 05/07/2020   Procedure: COLONOSCOPY WITH PROPOFOL;  Surgeon: Kathi Der, MD;  Location: [facility] ENDOSCOPY;  Service: Gastroenterology;  Laterality: Left;   COLONOSCOPY WITH PROPOFOL N/A 05/11/2020   Procedure: COLONOSCOPY WITH PROPOFOL;  Surgeon: Charlott Rakes, MD;  Location: [facility] ENDOSCOPY;  Service: Endoscopy;  Laterality: N/A;   ESOPHAGOGASTRODUODENOSCOPY N/A 05/06/2020   Procedure: ESOPHAGOGASTRODUODENOSCOPY (EGD);  Surgeon: Willis Modena, MD;  Location: [facility] ENDOSCOPY;  Service: Endoscopy;  Laterality: N/A;   HERNIA REPAIR     IR ANGIOGRAM SELECTIVE EACH ADDITIONAL  VESSEL  05/11/2020   IR ANGIOGRAM VISCERAL SELECTIVE  05/11/2020   IR EMBO ART  VEN HEMORR LYMPH EXTRAV  INC GUIDE ROADMAPPING  05/11/2020   IR IVC FILTER PLMT / S&I /IMG GUID/MOD SED  05/11/2020   IR RADIOLOGIST EVAL & MGMT  08/27/2020   IR US GUIDE VASC ACCESS RIGHT  05/11/2020   IR VENOGRAM RENAL BI  05/11/2020   TRANSURETHRAL RESECTION OF PROSTATE N/A 12/26/2014   Procedure: TRANSURETHRAL RESECTION OF THE PROSTATE;  Surgeon: Malen Gauze, MD;  Location: [facility] ORS;  Service: Urology;  Laterality: N/A;   Current Outpatient Medications on File Prior to Visit  Medication Sig Dispense Refill   acetaminophen (TYLENOL) 325 MG tablet Take 325-650 mg by mouth every 12 (twelve) hours as needed for mild pain or headache.     amLODipine (NORVASC) 10 MG tablet Take 10 mg by mouth daily.     Ascorbic Acid (VITAMIN C) 500 MG CAPS Take 1 tablet by mouth daily.     Blood Pressure Monitor DEVI      cetirizine (ZYRTEC) 10 MG tablet Take 10 mg by mouth daily as needed for allergies.     Cyanocobalamin (VITAMIN B-12) 500 MCG TABS Take 500 mcg by mouth daily.     Iron-FA-B Cmp-C-Biot-Probiotic (FUSION PLUS) CAPS Take 1 capsule by mouth daily.     latanoprost (XALATAN) 0.005 % ophthalmic solution Place 1 drop into both eyes at bedtime.     lisinopril-hydrochlorothiazide (ZESTORETIC) 20-12.5 MG tablet Take 1 tablet by mouth daily.     metFORMIN (GLUCOPHAGE-XR) 500 MG 24 hr tablet Take 2 tablets by mouth daily.     metoprolol succinate (TOPROL-XL) 25 MG 24 hr tablet Take 25 mg by mouth daily.     polyethylene glycol (MIRALAX / GLYCOLAX) 17 g packet Take 17 g by mouth daily. 14 each 0   predniSONE (DELTASONE) 10 MG tablet Take 10 mg by mouth daily.     rosuvastatin (CRESTOR) 5 MG tablet Take 5 mg by mouth daily.     sulfamethoxazole-trimethoprim (BACTRIM DS) 800-160 MG tablet Take 1 tablet by mouth 2 (two) times daily.     zinc sulfate 220 (50 Zn) MG capsule Take 220 mg by mouth daily.     [DISCONTINUED] benazepril  (LOTENSIN) 40 MG tablet Take 40 mg by mouth daily.     [DISCONTINUED] zolpidem (AMBIEN) 10 MG tablet Take 10 mg by mouth at bedtime as needed for sleep.      No current facility-administered medications on file prior to visit.    Allergies  Allergen Reactions   Hydrochlorothiazide Other (See Comments)    Reaction not recalled, believes it just made him urinate very frequently   Lipitor [Atorvastatin] Other (See Comments)    Muscle pain   Tizanidine Hcl Other (See Comments)   Viagra [Sildenafil Citrate] Palpitations   Social History   Occupational History   Not on file  Tobacco Use   Smoking status: Former    Types: Cigarettes    Quit date: 11/28/1984    Years since quitting: 35.9   Smokeless tobacco: Never  Substance and Sexual Activity   Alcohol use: Not Currently    Comment: occasional glass of wine   Drug use: No   Sexual activity: Not on file   Family History  Problem Relation Age of Onset  CVA Father    Hypertension Father    Leukemia Sister    Thyroid disease Sister    Cancer Brother    Immunization History  Administered Date(s) Administered   Influenza Split 10/26/2007, 10/22/2008, 10/17/2009, 10/12/2010, 10/22/2010, 11/09/2011, 10/20/2012   Influenza,inj,Quad PF,6+ Mos 09/23/2015, 10/28/2016, 09/27/2017, 09/28/2018, 10/18/2019   Influenza,inj,quad, With Preservative 10/12/2013, 10/28/2014   PFIZER(Purple Top)SARS-COV-2 Vaccination 01/24/2019, 04/18/2019, 11/10/2019   Pneumococcal Conjugate-13 01/08/2014   Pneumococcal Polysaccharide-23 10/04/2007   Td 08/12/2003   Tdap 07/11/2012   Zoster, Live 11/05/2009     Review of Systems: Negative except as noted in the HPI.   Objective: There were no vitals filed for this visit.  Thomas Joseph is a pleasant 87 y.o. male in NAD. AAO X 3.  Vascular Examination: CFT <3 seconds b/l LE. Faintly palpable DP pulse(s) b/l lower extremities. Nonpalpable PT pulse(s) b/l lower extremities. Pedal hair absent. No pain  with calf compression b/l. Lower extremity skin temperature gradient within normal limits. +1 pitting edema bilateral ankles.  Dermatological Examination: Pedal skin thin and atrophic b/l LE. No open wounds b/l LE. No interdigital macerations b/l lower extremities. Toenails L hallux, L 3rd toe, L 4th toe, L 5th toe, R hallux, R 3rd toe, R 4th toe, and R 5th toe elongated, discolored, dystrophic, thickened, and crumbly with subungual debris and tenderness to dorsal palpation. Anonychia noted L 2nd toe and R 2nd toe. Nailbed(s) epithelialized.  No hyperkeratotic nor porokeratotic lesions present on today's visit.  Musculoskeletal Examination: Normal muscle strength 5/5 to all lower extremity muscle groups bilaterally. Mild HAV with bunion deformity b/l. Dorsal exostosis left midfoot area. No erythema, no edema, no drainage, no fluctuance. Patient ambulates independent of any assistive aids.  Footwear Assessment: Does the patient wear appropriate shoes? Yes. Does the patient need inserts/orthotics? Yes.  Neurological Examination: Protective sensation intact 5/5 intact bilaterally with 10g monofilament b/l. Vibratory sensation diminished b/l. Proprioception intact bilaterally.  Hemoglobin A1C Latest Ref Rng & Units 05/05/2020  HGBA1C 4.8 - 5.6 % 7.2(H)  Some recent data might be hidden   Assessment: 1. Pain due to onychomycosis of toenails of both feet   2. Hallux valgus, acquired, bilateral   3. Exostosis of left foot   4. Localized edema   5. Type II diabetes mellitus with peripheral circulatory disorder (HCC)   6. Encounter for diabetic foot exam ([place])     ADA Risk Categorization: High Risk  Patient has one or more of the following: Loss of protective sensation Absent pedal pulses Severe Foot deformity History of foot ulcer  Plan: -Examined patient. -Diabetic foot examination performed today. -Continue diabetic foot care principles: inspect feet daily, monitor glucose as  recommended by PCP and/or Endocrinologist, and follow prescribed diet per PCP, Endocrinologist and/or dietician. -Patient to continue soft, supportive shoe gear daily. Start procedure for diabetic shoes. Patient qualifies based on diagnoses. -Toenails L hallux, L 3rd toe, L 4th toe, L 5th toe, R hallux, R 3rd toe, R 4th toe, and R 5th toe debrided in length and girth without iatrogenic bleeding with sterile nail nipper and dremel.  -Patient/POA to call should there be question/concern in the interim.  Return in about 3 months (around 02/17/2021).  Marzetta Board, DPM

## 2020-11-17 NOTE — Patient Instructions (Signed)
Diabetes Mellitus and Foot Care Foot care is an important part of your health, especially when you have diabetes. Diabetes may cause you to have problems because of poor blood flow (circulation) to your feet and legs, which can cause your skin to: Become thinner and drier. Break more easily. Heal more slowly. Peel and crack. You may also have nerve damage (neuropathy) in your legs and feet, causing decreased feeling in them. This means that you may not notice minor injuries to your feet that could lead to more serious problems. Noticing and addressing any potential problems early is the best way to prevent future foot problems. How to care for your feet Foot hygiene  Wash your feet daily with warm water and mild soap. Do not use hot water. Then, pat your feet and the areas between your toes until they are completely dry. Do not soak your feet as this can dry your skin. Trim your toenails straight across. Do not dig under them or around the cuticle. File the edges of your nails with an emery board or nail file. Apply a moisturizing lotion or petroleum jelly to the skin on your feet and to dry, brittle toenails. Use lotion that does not contain alcohol and is unscented. Do not apply lotion between your toes. Shoes and socks Wear clean socks or stockings every day. Make sure they are not too tight. Do not wear knee-high stockings since they may decrease blood flow to your legs. Wear shoes that fit properly and have enough cushioning. Always look in your shoes before you put them on to be sure there are no objects inside. To break in new shoes, wear them for just a few hours a day. This prevents injuries on your feet. Wounds, scrapes, corns, and calluses  Check your feet daily for blisters, cuts, bruises, sores, and redness. If you cannot see the bottom of your feet, use a mirror or ask someone for help. Do not cut corns or calluses or try to remove them with medicine. If you find a minor scrape,  cut, or break in the skin on your feet, keep it and the skin around it clean and dry. You may clean these areas with mild soap and water. Do not clean the area with peroxide, alcohol, or iodine. If you have a wound, scrape, corn, or callus on your foot, look at it several times a day to make sure it is healing and not infected. Check for: Redness, swelling, or pain. Fluid or blood. Warmth. Pus or a bad smell. General tips Do not cross your legs. This may decrease blood flow to your feet. Do not use heating pads or hot water bottles on your feet. They may burn your skin. If you have lost feeling in your feet or legs, you may not know this is happening until it is too late. Protect your feet from hot and cold by wearing shoes, such as at the beach or on hot pavement. Schedule a complete foot exam at least once a year (annually) or more often if you have foot problems. Report any cuts, sores, or bruises to your health care provider immediately. Where to find more information American Diabetes Association: www.diabetes.org Association of Diabetes Care & Education Specialists: www.diabeteseducator.org Contact a health care provider if: You have a medical condition that increases your risk of infection and you have any cuts, sores, or bruises on your feet. You have an injury that is not healing. You have redness on your legs or feet. You   feel burning or tingling in your legs or feet. You have pain or cramps in your legs and feet. Your legs or feet are numb. Your feet always feel cold. You have pain around any toenails. Get help right away if: You have a wound, scrape, corn, or callus on your foot and: You have pain, swelling, or redness that gets worse. You have fluid or blood coming from the wound, scrape, corn, or callus. Your wound, scrape, corn, or callus feels warm to the touch. You have pus or a bad smell coming from the wound, scrape, corn, or callus. You have a fever. You have a red  line going up your leg. Summary Check your feet every day for blisters, cuts, bruises, sores, and redness. Apply a moisturizing lotion or petroleum jelly to the skin on your feet and to dry, brittle toenails. Wear shoes that fit properly and have enough cushioning. If you have foot problems, report any cuts, sores, or bruises to your health care provider immediately. Schedule a complete foot exam at least once a year (annually) or more often if you have foot problems. This information is not intended to replace advice given to you by your health care provider. Make sure you discuss any questions you have with your health care provider. Document Revised: 07/19/2019 Document Reviewed: 07/19/2019 Elsevier Patient Education  2022 [employer].  Edema Edema is an abnormal buildup of fluids in the body tissues and under the skin. Swelling of the legs, feet, and ankles is a common symptom that becomes more likely as you get older. Swelling is also common in looser tissues, like around the eyes. When the affected area is squeezed, the fluid may move out of that spot and leave a dent for a few moments. This dent is called pitting edema. There are many possible causes of edema. Eating too much salt (sodium) and being on your feet or sitting for a long time can cause edema in your legs, feet, and ankles. Hot weather may make edema worse. Common causes of edema include: Heart failure. Liver or kidney disease. Weak leg blood vessels. Cancer. An injury. Pregnancy. Medicines. Being obese. Low protein levels in the blood. Edema is usually painless. Your skin may look swollen or shiny. Follow these instructions at home: Keep the affected body part raised (elevated) above the level of your heart when you are sitting or lying down. Do not sit still or stand for long periods of time. Do not wear tight clothing. Do not wear garters on your upper legs. Exercise your legs to get your circulation going. This  helps to move the fluid back into your blood vessels, and it may help the swelling go down. Wear elastic bandages or support stockings to reduce swelling as told by your health care provider. Eat a low-salt (low-sodium) diet to reduce fluid as told by your health care provider. Depending on the cause of your swelling, you may need to limit how much fluid you drink (fluid restriction). Take over-the-counter and prescription medicines only as told by your health care provider. Contact a health care provider if: Your edema does not get better with treatment. You have heart, liver, or kidney disease and have symptoms of edema. You have sudden and unexplained weight gain. Get help right away if: You develop shortness of breath or chest pain. You cannot breathe when you lie down. You develop pain, redness, or warmth in the swollen areas. You have heart, liver, or kidney disease and suddenly get edema. You  have a fever and your symptoms suddenly get worse. Summary Edema is an abnormal buildup of fluids in the body tissues and under the skin. Eating too much salt (sodium) and being on your feet or sitting for a long time can cause edema in your legs, feet, and ankles. Keep the affected body part raised (elevated) above the level of your heart when you are sitting or lying down. This information is not intended to replace advice given to you by your health care provider. Make sure you discuss any questions you have with your health care provider. Document Revised: 05/29/2020 Document Reviewed: 10/23/2019 Elsevier Patient Education  2022 [employer].

## 2020-11-24 DIAGNOSIS — E785 Hyperlipidemia, unspecified: Secondary | ICD-10-CM | POA: Diagnosis not present

## 2020-11-24 DIAGNOSIS — E038 Other specified hypothyroidism: Secondary | ICD-10-CM | POA: Diagnosis not present

## 2020-11-24 DIAGNOSIS — N2581 Secondary hyperparathyroidism of renal origin: Secondary | ICD-10-CM | POA: Diagnosis not present

## 2020-11-24 DIAGNOSIS — I480 Paroxysmal atrial fibrillation: Secondary | ICD-10-CM | POA: Diagnosis not present

## 2020-11-24 DIAGNOSIS — I10 Essential (primary) hypertension: Secondary | ICD-10-CM | POA: Diagnosis not present

## 2020-11-24 DIAGNOSIS — E1122 Type 2 diabetes mellitus with diabetic chronic kidney disease: Secondary | ICD-10-CM | POA: Diagnosis not present

## 2020-11-24 DIAGNOSIS — N183 Chronic kidney disease, stage 3 unspecified: Secondary | ICD-10-CM | POA: Diagnosis not present

## 2020-11-24 DIAGNOSIS — D649 Anemia, unspecified: Secondary | ICD-10-CM | POA: Diagnosis not present

## 2020-12-15 DIAGNOSIS — H11823 Conjunctivochalasis, bilateral: Secondary | ICD-10-CM | POA: Diagnosis not present

## 2020-12-15 DIAGNOSIS — Z79899 Other long term (current) drug therapy: Secondary | ICD-10-CM | POA: Diagnosis not present

## 2020-12-15 DIAGNOSIS — H31092 Other chorioretinal scars, left eye: Secondary | ICD-10-CM | POA: Diagnosis not present

## 2020-12-15 DIAGNOSIS — H401111 Primary open-angle glaucoma, right eye, mild stage: Secondary | ICD-10-CM | POA: Diagnosis not present

## 2020-12-15 DIAGNOSIS — H02834 Dermatochalasis of left upper eyelid: Secondary | ICD-10-CM | POA: Diagnosis not present

## 2020-12-15 DIAGNOSIS — H40053 Ocular hypertension, bilateral: Secondary | ICD-10-CM | POA: Diagnosis not present

## 2020-12-15 DIAGNOSIS — H02831 Dermatochalasis of right upper eyelid: Secondary | ICD-10-CM | POA: Diagnosis not present

## 2020-12-15 DIAGNOSIS — H2513 Age-related nuclear cataract, bilateral: Secondary | ICD-10-CM | POA: Diagnosis not present

## 2020-12-15 DIAGNOSIS — H11133 Conjunctival pigmentations, bilateral: Secondary | ICD-10-CM | POA: Diagnosis not present

## 2020-12-15 DIAGNOSIS — E119 Type 2 diabetes mellitus without complications: Secondary | ICD-10-CM | POA: Diagnosis not present

## 2020-12-29 ENCOUNTER — Other Ambulatory Visit: Payer: Self-pay

## 2020-12-29 ENCOUNTER — Encounter (HOSPITAL_COMMUNITY): Payer: Self-pay | Admitting: Emergency Medicine

## 2020-12-29 ENCOUNTER — Emergency Department (HOSPITAL_COMMUNITY): Payer: Medicare Other

## 2020-12-29 ENCOUNTER — Emergency Department (HOSPITAL_COMMUNITY)
Admission: EM | Admit: 2020-12-29 | Discharge: 2020-12-30 | Disposition: A | Discharge status: Home / Self Care | Payer: Medicare Other | Attending: Emergency Medicine | Admitting: Emergency Medicine

## 2020-12-29 ENCOUNTER — Ambulatory Visit
Admission: EM | Admit: 2020-12-29 | Discharge: 2020-12-29 | Disposition: A | Discharge status: Home / Self Care | Payer: Medicare Other | Attending: Physician Assistant | Admitting: Physician Assistant

## 2020-12-29 DIAGNOSIS — N1831 Chronic kidney disease, stage 3a: Secondary | ICD-10-CM | POA: Diagnosis not present

## 2020-12-29 DIAGNOSIS — M25512 Pain in left shoulder: Secondary | ICD-10-CM | POA: Insufficient documentation

## 2020-12-29 DIAGNOSIS — I4891 Unspecified atrial fibrillation: Secondary | ICD-10-CM | POA: Diagnosis not present

## 2020-12-29 DIAGNOSIS — Z79899 Other long term (current) drug therapy: Secondary | ICD-10-CM | POA: Diagnosis not present

## 2020-12-29 DIAGNOSIS — Z87891 Personal history of nicotine dependence: Secondary | ICD-10-CM | POA: Diagnosis not present

## 2020-12-29 DIAGNOSIS — Z7984 Long term (current) use of oral hypoglycemic drugs: Secondary | ICD-10-CM | POA: Diagnosis not present

## 2020-12-29 DIAGNOSIS — M79602 Pain in left arm: Secondary | ICD-10-CM

## 2020-12-29 DIAGNOSIS — E1122 Type 2 diabetes mellitus with diabetic chronic kidney disease: Secondary | ICD-10-CM | POA: Insufficient documentation

## 2020-12-29 DIAGNOSIS — M4802 Spinal stenosis, cervical region: Secondary | ICD-10-CM | POA: Diagnosis not present

## 2020-12-29 DIAGNOSIS — Z8616 Personal history of COVID-19: Secondary | ICD-10-CM | POA: Insufficient documentation

## 2020-12-29 DIAGNOSIS — I10 Essential (primary) hypertension: Secondary | ICD-10-CM | POA: Diagnosis not present

## 2020-12-29 DIAGNOSIS — M542 Cervicalgia: Secondary | ICD-10-CM

## 2020-12-29 DIAGNOSIS — M47812 Spondylosis without myelopathy or radiculopathy, cervical region: Secondary | ICD-10-CM | POA: Diagnosis not present

## 2020-12-29 DIAGNOSIS — S199XXA Unspecified injury of neck, initial encounter: Secondary | ICD-10-CM | POA: Diagnosis not present

## 2020-12-29 DIAGNOSIS — I129 Hypertensive chronic kidney disease with stage 1 through stage 4 chronic kidney disease, or unspecified chronic kidney disease: Secondary | ICD-10-CM | POA: Insufficient documentation

## 2020-12-29 DIAGNOSIS — R0602 Shortness of breath: Secondary | ICD-10-CM | POA: Diagnosis present

## 2020-12-29 DIAGNOSIS — R079 Chest pain, unspecified: Secondary | ICD-10-CM | POA: Diagnosis not present

## 2020-12-29 LAB — BASIC METABOLIC PANEL
Anion gap: 10 (ref 5–15)
BUN: 34 mg/dL — ABNORMAL HIGH (ref 8–23)
CO2: 26 mmol/L (ref 22–32)
Calcium: 9.5 mg/dL (ref 8.9–10.3)
Chloride: 102 mmol/L (ref 98–111)
Creatinine, Ser: 1.7 mg/dL — ABNORMAL HIGH (ref 0.61–1.24)
GFR, Estimated: 39 mL/min — ABNORMAL LOW (ref >60)
Glucose, Bld: 206 mg/dL — ABNORMAL HIGH (ref 70–99)
Potassium: 4.2 mmol/L (ref 3.5–5.1)
Sodium: 138 mmol/L (ref 135–145)

## 2020-12-29 LAB — CBC
HCT: 42.2 % (ref 39.0–52.0)
Hemoglobin: 13.7 g/dL (ref 13.0–17.0)
MCH: 31.2 pg (ref 26.0–34.0)
MCHC: 32.5 g/dL (ref 30.0–36.0)
MCV: 96.1 fL (ref 80.0–100.0)
Platelets: 185 10*3/uL (ref 150–400)
RBC: 4.39 MIL/uL (ref 4.22–5.81)
RDW: 14 % (ref 11.5–15.5)
WBC: 5.5 10*3/uL (ref 4.0–10.5)
nRBC: 0 % (ref 0.0–0.2)

## 2020-12-29 LAB — TROPONIN I (HIGH SENSITIVITY)
Troponin I (High Sensitivity): 31 ng/L — ABNORMAL HIGH (ref <18)
Troponin I (High Sensitivity): 35 ng/L — ABNORMAL HIGH (ref <18)

## 2020-12-29 NOTE — ED Triage Notes (Signed)
Pt c/o left shoulder pain, left arm pain, SOB. Pain intermittent depending on activity.   Onset last Wednesday.

## 2020-12-29 NOTE — ED Provider Notes (Signed)
Emergency Medicine Provider Triage Evaluation Note  Thomas Joseph , a 87 y.o. male  was evaluated in triage.  Pt complains of chest pain, bilateral upper extremity pain and weakness..  Sent in from urgent care.  He fell and had hurt his left shoulder a few weeks ago.  Denies having neck pain and pain down his left arm.  He has had some a little bit of shortness of breath and chest pain as well.  He denies nausea vomiting or diaphoresis.  Review of Systems  Positive: BL arm pain  Negative: weakness  Physical Exam  BP 136/68 (BP Location: Left Arm)    Pulse 80    Temp 97.8 F (36.6 C)    Resp 18    SpO2 100%  Gen:   Awake, no distress   Resp:  Normal effort  MSK:   Moves extremities without difficulty  Other:  No grip weakness  Medical Decision Making  Medically screening exam initiated at 4:55 PM.  Appropriate orders placed.  [name] was informed that the remainder of the evaluation will be completed by another provider, this initial triage assessment does not replace that evaluation, and the importance of remaining in the ED until their evaluation is complete.   Cp ct work up initiated   Arthor Captain, PA-C 12/29/20 [id number]    Pollyann Savoy, MD 12/29/20 [id number]

## 2020-12-29 NOTE — ED Provider Notes (Signed)
[facility]    CSN: [id number] Arrival date & time: 12/29/20  1318      History   Chief Complaint Chief Complaint  Patient presents with   left shoulder/armpain and sob    HPI Thomas Joseph is a 87 y.o. male.   Patient here today for evaluation of left shoulder, arm pain that started several days ago but seems to be worsening in severity. He reports that he actually has some improvement with activity and notices pain more when he is sitting still. He has had some shortness of breath and does endorse some lightheadedness as well. He reports some nausea but no vomiting.   The history is provided by the patient.   Past Medical History:  Diagnosis Date   Anemia    BPH (benign prostatic hyperplasia)    Chronic kidney disease (CKD) stage G3a/A1, moderately decreased glomerular filtration rate (GFR) between 45-59 mL/min/1.73 square meter and albuminuria creatinine ratio less than 30 mg/g (HCC) 03/07/2020   Diabetes mellitus without complication [facility])    ED (erectile dysfunction)    Family history of adverse reaction to anesthesia    son had some problem years ago, pt unsure of what exact problem was   Hypertension    Obesity    Pneumonia     Patient Active Problem List   Diagnosis Date Noted   Hypomagnesemia 05/11/2020   Atrial fibrillation with RVR (HCC) 05/11/2020   GI bleed 05/05/2020   AKI (acute kidney injury) (HCC)    2019 novel coronavirus disease (COVID-19) 03/07/2020   Anemia 03/07/2020   Arthropathy 03/07/2020   Body mass index (BMI) 32.0-32.9, adult 03/07/2020   Body mass index (BMI) 34.0-34.9, adult 03/07/2020   Chronic kidney disease (CKD) stage G3a/A1, moderately decreased glomerular filtration rate (GFR) between 45-59 mL/min/1.73 square meter and albuminuria creatinine ratio less than 30 mg/g (HCC) 03/07/2020   Chronic sinusitis 03/07/2020   Constipation 03/07/2020   Diabetic renal disease (HCC) 03/07/2020   Hardening of the aorta (main  artery of the heart) (HCC) 03/07/2020   Hyperlipidemia 03/07/2020   Microscopic hematuria 03/07/2020   Nausea 03/07/2020   Other abnormalities of gait and mobility 03/07/2020   Personal history of colonic polyps 03/07/2020   Pneumonia due to SARS-associated coronavirus 03/07/2020   Primary open-angle glaucoma, right eye, mild stage 03/07/2020   Degeneration of lumbar intervertebral disc 03/07/2020   Radiculopathy due to lumbar intervertebral disc disorder 03/07/2020   Right lower quadrant pain 03/07/2020   S/P TURP (status post transurethral resection of prostate) 03/07/2020   Secondary hyperparathyroidism of renal origin (HCC) 03/07/2020   Senile purpura (HCC) 03/07/2020   Sequelae of other specified infectious and parasitic diseases 03/07/2020   Stricture of artery (HCC) 03/07/2020   Subclinical hypothyroidism 03/07/2020   History of COVID-19 04/11/2019   Shortness of breath 04/11/2019   Irregular heart rhythm 04/11/2019   Fatigue 04/11/2019   Generalized weakness 04/11/2019   Acute respiratory failure with hypoxia (HCC) 02/02/2019   Essential hypertension 02/02/2019   Type 2 diabetes mellitus with complication, without long-term current use of insulin (HCC) 02/02/2019   Hypokalemia 02/02/2019   BPH (benign prostatic hyperplasia) 12/26/2014    Past Surgical History:  Procedure Laterality Date    LEFT KNEE ARTHROCOPY     COLONOSCOPY WITH PROPOFOL Left 05/07/2020   Procedure: COLONOSCOPY WITH PROPOFOL;  Surgeon: Kathi Der, MD;  Location: [facility];  Service: Gastroenterology;  Laterality: Left;   COLONOSCOPY WITH PROPOFOL N/A 05/11/2020   Procedure: COLONOSCOPY WITH PROPOFOL;  Surgeon: Wilford Corner, MD;  Location: [place];  Service: Endoscopy;  Laterality: N/A;   ESOPHAGOGASTRODUODENOSCOPY N/A 05/06/2020   Procedure: ESOPHAGOGASTRODUODENOSCOPY (EGD);  Surgeon: Arta Silence, MD;  Location: [facility] ENDOSCOPY;  Service: Endoscopy;  Laterality: N/A;   HERNIA REPAIR      IR ANGIOGRAM SELECTIVE EACH ADDITIONAL VESSEL  05/11/2020   IR ANGIOGRAM VISCERAL SELECTIVE  05/11/2020   IR EMBO ART  VEN HEMORR LYMPH EXTRAV  INC GUIDE ROADMAPPING  05/11/2020   IR IVC FILTER PLMT / S&I /IMG GUID/MOD SED  05/11/2020   IR RADIOLOGIST EVAL & MGMT  08/27/2020   IR US GUIDE VASC ACCESS RIGHT  05/11/2020   IR VENOGRAM RENAL BI  05/11/2020   TRANSURETHRAL RESECTION OF PROSTATE N/A 12/26/2014   Procedure: TRANSURETHRAL RESECTION OF THE PROSTATE;  Surgeon: Cleon Gustin, MD;  Location: [facility];  Service: Urology;  Laterality: N/A;       Home Medications    Prior to Admission medications   Medication Sig Start Date End Date Taking? Authorizing Provider  acetaminophen (TYLENOL) 325 MG tablet Take 325-650 mg by mouth every 12 (twelve) hours as needed for mild pain or headache.    [provider]  amLODipine (NORVASC) 10 MG tablet Take 10 mg by mouth daily.    [provider]  Ascorbic Acid (VITAMIN C) 500 MG CAPS Take 1 tablet by mouth daily. 02/08/19   [provider]  Blood Pressure Monitor DEVI  10/23/19   [provider]  cetirizine (ZYRTEC) 10 MG tablet Take 10 mg by mouth daily as needed for allergies.    [provider]  Cyanocobalamin (VITAMIN B-12) 500 MCG TABS Take 500 mcg by mouth daily. 05/20/20   Geradine Girt, DO  Iron-FA-B Cmp-C-Biot-Probiotic (FUSION PLUS) CAPS Take 1 capsule by mouth daily. 06/26/20   [provider]  latanoprost (XALATAN) 0.005 % ophthalmic solution Place 1 drop into both eyes at bedtime. 01/19/19   [provider]  lisinopril-hydrochlorothiazide (ZESTORETIC) 20-12.5 MG tablet Take 1 tablet by mouth daily. 02/15/19   [provider]  metFORMIN (GLUCOPHAGE-XR) 500 MG 24 hr tablet Take 2 tablets by mouth daily. 07/03/20   [provider]  metoprolol succinate (TOPROL-XL) 25 MG 24 hr tablet Take 25 mg by mouth daily. 05/28/20   [provider]  polyethylene glycol (MIRALAX  / GLYCOLAX) 17 g packet Take 17 g by mouth daily. 05/21/20   Geradine Girt, DO  predniSONE (DELTASONE) 10 MG tablet Take 10 mg by mouth daily. 11/13/20   [provider]  rosuvastatin (CRESTOR) 5 MG tablet Take 5 mg by mouth daily. 08/26/19   [provider]  sulfamethoxazole-trimethoprim (BACTRIM DS) 800-160 MG tablet Take 1 tablet by mouth 2 (two) times daily. 06/27/20   [provider]  zinc sulfate 220 (50 Zn) MG capsule Take 220 mg by mouth daily. 02/08/19   [provider]  benazepril (LOTENSIN) 40 MG tablet Take 40 mg by mouth daily.  02/02/19  [provider]  zolpidem (AMBIEN) 10 MG tablet Take 10 mg by mouth at bedtime as needed for sleep.   02/02/19  [provider]    Family History Family History  Problem Relation Age of Onset   CVA Father    Hypertension Father    Leukemia Sister    Thyroid disease Sister    Cancer Brother     Social History Social History   Tobacco Use   Smoking status: Former    Types: Cigarettes  Quit date: 11/28/1984    Years since quitting: 36.1   Smokeless tobacco: Never  Substance Use Topics   Alcohol use: Not Currently    Comment: occasional glass of wine   Drug use: No     Allergies   Hydrochlorothiazide, Lipitor [atorvastatin], Tizanidine hcl, and Viagra [sildenafil citrate]   Review of Systems Review of Systems  Constitutional:  Negative for chills and fever.  Eyes:  Negative for discharge and redness.  Respiratory:  Positive for shortness of breath.   Gastrointestinal:  Positive for nausea. Negative for vomiting.  Musculoskeletal:  Positive for myalgias.  Skin:  Positive for color change and wound.  Neurological:  Positive for light-headedness. Negative for numbness.    Physical Exam Triage Vital Signs ED Triage Vitals [12/29/20 1451]  Enc Vitals Group     BP (!) 159/77     Pulse Rate 65     Resp 18     Temp 98.3 F (36.8 C)     Temp Source Oral     SpO2 97 %      Weight      Height      Head Circumference      Peak Flow      Pain Score 6     Pain Loc      Pain Edu?      Excl. in GC?    No data found.  Updated Vital Signs BP (!) 159/77 (BP Location: Left Arm)    Pulse 65    Temp 98.3 F (36.8 C) (Oral)    Resp 18    SpO2 97%     Physical Exam Vitals and nursing note reviewed.  Constitutional:      General: He is not in acute distress.    Appearance: Normal appearance. He is not ill-appearing.  HENT:     Head: Normocephalic and atraumatic.  Eyes:     Conjunctiva/sclera: Conjunctivae normal.  Cardiovascular:     Rate and Rhythm: Normal rate and regular rhythm.     Heart sounds: Normal heart sounds. No murmur heard. Pulmonary:     Effort: Pulmonary effort is normal. No respiratory distress.     Breath sounds: Normal breath sounds. No wheezing, rhonchi or rales.  Neurological:     Mental Status: He is alert.  Psychiatric:        Mood and Affect: Mood normal.        Behavior: Behavior normal.        Thought Content: Thought content normal.     UC Treatments / Results  Labs (all labs ordered are listed, but only abnormal results are displayed) Labs Reviewed - No data to display  EKG   Radiology No results found.  Procedures Procedures (including critical care time)  Medications Ordered in UC Medications - No data to display  Initial Impression / Assessment and Plan / UC Course  I have reviewed the triage vital signs and the nursing notes.  Pertinent labs & imaging results that were available during my care of the patient were reviewed by me and considered in my medical decision making (see chart for details).    EKG with some changes compared to that from 8 months ago. Symptoms are not fully consistent with cardiac etiology but I feel he will need rule out of same. Recommended further evaluation in the ED. Patient's family member will transport- family preferred this over EMS transfer.  Final Clinical Impressions(s) /  UC Diagnoses   Final diagnoses:  Left arm  pain   Discharge Instructions   None    ED Prescriptions   None    PDMP not reviewed this encounter.   Francene Finders, PA-C 12/29/20 1534

## 2020-12-29 NOTE — ED Triage Notes (Signed)
Pt sent here from UC for upper back/neck pain that radiates into both arms, worse on the L, w/ shob. Per wife pt had a fall 2 weeks ago and landed on R side, but this pain started on his L.

## 2020-12-30 MED ORDER — ACETAMINOPHEN 500 MG PO TABS
1000.0000 mg | ORAL_TABLET | Freq: Once | ORAL | Status: AC
  Administered 2020-12-30: 04:00:00 1000 mg via ORAL
  Filled 2020-12-30: qty 2

## 2020-12-30 MED ORDER — PREDNISONE 20 MG PO TABS: 40.0000 mg | ORAL_TABLET | Freq: Every day | ORAL | 0 refills | Status: DC

## 2020-12-30 NOTE — ED Notes (Signed)
Denies chest pain 

## 2020-12-30 NOTE — Discharge Instructions (Signed)
You are seen today for neck pain.  Your work-up today is reassuring.  Increase her steroid dose to 40 mg for 5 days.  Also take Tylenol every 8 hours as needed for pain.  If not improving or if you have any new onset symptoms such as weakness, you should be reevaluated.

## 2020-12-30 NOTE — ED Provider Notes (Signed)
[facility] EMERGENCY DEPARTMENT Provider Note   CSN: [id number] Arrival date & time: 12/29/20  1608     History Chief Complaint  Patient presents with   Shortness of Breath   Back Pain    Thomas Joseph is a 87 y.o. male.  HPI     This is an 87 year old male with history of chronic kidney disease, diabetes, hypertension who presents from urgent care with concerns for neck pain.  He was seen and evaluated in urgent care for worsening neck pain.  Reports onset of symptoms around Tuesday.  He describes pain in the left side of his neck and shoulder.  Pain does not radiate.  He describes it as achy.  He states it is worse when he sitting down and better with activity.  He has not take any medication for his pain.  Denies chest pain but did endorse some shortness of breath urgent care which provoked him to be evaluated in the emergency department.  Has not had any recent fevers or cough.  Denies weakness, numbness, tingling, strokelike symptoms.  Patient denies injury.  Past Medical History:  Diagnosis Date   Anemia    BPH (benign prostatic hyperplasia)    Chronic kidney disease (CKD) stage G3a/A1, moderately decreased glomerular filtration rate (GFR) between 45-59 mL/min/1.73 square meter and albuminuria creatinine ratio less than 30 mg/g (HCC) 03/07/2020   Diabetes mellitus without complication [facility])    ED (erectile dysfunction)    Family history of adverse reaction to anesthesia    son had some problem years ago, pt unsure of what exact problem was   Hypertension    Obesity    Pneumonia     Patient Active Problem List   Diagnosis Date Noted   Hypomagnesemia 05/11/2020   Atrial fibrillation with RVR ([place]) 05/11/2020   GI bleed 05/05/2020   AKI (acute kidney injury) ([place])    2019 novel coronavirus disease (COVID-19) 03/07/2020   Anemia 03/07/2020   Arthropathy 03/07/2020   Body mass index (BMI) 32.0-32.9, adult 03/07/2020   Body mass index (BMI)  34.0-34.9, adult 03/07/2020   Chronic kidney disease (CKD) stage G3a/A1, moderately decreased glomerular filtration rate (GFR) between 45-59 mL/min/1.73 square meter and albuminuria creatinine ratio less than 30 mg/g (HCC) 03/07/2020   Chronic sinusitis 03/07/2020   Constipation 03/07/2020   Diabetic renal disease ([place]) 03/07/2020   Hardening of the aorta (main artery of the heart) ([place]) 03/07/2020   Hyperlipidemia 03/07/2020   Microscopic hematuria 03/07/2020   Nausea 03/07/2020   Other abnormalities of gait and mobility 03/07/2020   Personal history of colonic polyps 03/07/2020   Pneumonia due to SARS-associated coronavirus 03/07/2020   Primary open-angle glaucoma, right eye, mild stage 03/07/2020   Degeneration of lumbar intervertebral disc 03/07/2020   Radiculopathy due to lumbar intervertebral disc disorder 03/07/2020   Right lower quadrant pain 03/07/2020   S/P TURP (status post transurethral resection of prostate) 03/07/2020   Secondary hyperparathyroidism of renal origin ([place]) 03/07/2020   Senile purpura ([place]) 03/07/2020   Sequelae of other specified infectious and parasitic diseases 03/07/2020   Stricture of artery ([place]) 03/07/2020   Subclinical hypothyroidism 03/07/2020   History of COVID-19 04/11/2019   Shortness of breath 04/11/2019   Irregular heart rhythm 04/11/2019   Fatigue 04/11/2019   Generalized weakness 04/11/2019   Acute respiratory failure with hypoxia ([place]) 02/02/2019   Essential hypertension 02/02/2019   Type 2 diabetes mellitus with complication, without long-term current use of insulin ([place]) 02/02/2019   Hypokalemia  02/02/2019   BPH (benign prostatic hyperplasia) 12/26/2014    Past Surgical History:  Procedure Laterality Date    LEFT KNEE ARTHROCOPY     COLONOSCOPY WITH PROPOFOL Left 05/07/2020   Procedure: COLONOSCOPY WITH PROPOFOL;  Surgeon: Kathi Der, MD;  Location: [facility];  Service: Gastroenterology;  Laterality: Left;   COLONOSCOPY  WITH PROPOFOL N/A 05/11/2020   Procedure: COLONOSCOPY WITH PROPOFOL;  Surgeon: Charlott Rakes, MD;  Location[facility] END[facility]COPY;  Service: Endoscopy;  Laterality: N/A;   ESOPHAGOGASTRODUODENOSCOPY N/A 05/06/2020   Procedure: ESOPHAGOGASTRODUODENOSCOPY (EGD);  Surgeon: Willis Modena, MD;  Location: [facility] ENDOSCOPY;  Service: Endoscopy;  Laterality: N/A;   HERNIA REPAIR     IR ANGIOGRAM SELECTIVE EACH ADDITIONAL VESSEL  05/11/2020   IR ANGIOGRAM VISCERAL SELECTIVE  05/11/2020   IR EMBO ART  VEN HEMORR LYMPH EXTRAV  INC GUIDE ROADMAPPING  05/11/2020   IR IVC FILTER PLMT / S&I /IMG GUID/MOD SED  05/11/2020   IR RADIOLOGIST EVAL & MGMT  08/27/2020   IR US GUIDE VASC ACCESS RIGHT  05/11/2020   IR VENOGRAM RENAL BI  05/11/2020   TRANSURETHRAL RESECTION OF PROSTATE N/A 12/26/2014   Procedure: TRANSURETHRAL RESECTION OF THE PROSTATE;  Surgeon: Malen Gauze, MD;  Location: [facility];  Service: Urology;  Laterality: N/A;       Family History  Problem Relation Age of Onset   CVA Father    Hypertension Father    Leukemia Sister    Thyroid disease Sister    Cancer Brother     Social History   Tobacco Use   Smoking status: Former    Types: Cigarettes    Quit date: 11/28/1984    Years since quitting: 36.1   Smokeless tobacco: Never  Substance Use Topics   Alcohol use: Not Currently    Comment: occasional glass of wine   Drug use: No    Home Medications Prior to Admission medications   Medication Sig Start Date End Date Taking? Authorizing Provider  predniSONE (DELTASONE) 20 MG tablet Take 2 tablets (40 mg total) by mouth daily. 12/30/20  Yes Brittania Sudbeck, Mayer Masker, MD  acetaminophen (TYLENOL) 325 MG tablet Take 325-650 mg by mouth every 12 (twelve) hours as needed for mild pain or headache.    [provider]  amLODipine (NORVASC) 10 MG tablet Take 10 mg by mouth daily.    [provider]  Ascorbic Acid (VITAMIN C) 500 MG CAPS Take 1 tablet by mouth daily. 02/08/19   [provider]  Blood Pressure Monitor DEVI  10/23/19   [provider]  cetirizine (ZYRTEC) 10 MG tablet Take 10 mg by mouth daily as needed for allergies.    [provider]  Cyanocobalamin (VITAMIN B-12) 500 MCG TABS Take 500 mcg by mouth daily. 05/20/20   Joseph Art, DO  Iron-FA-B Cmp-C-Biot-Probiotic (FUSION PLUS) CAPS Take 1 capsule by mouth daily. 06/26/20   [provider]  latanoprost (XALATAN) 0.005 % ophthalmic solution Place 1 drop into both eyes at bedtime. 01/19/19   [provider]  lisinopril-hydrochlorothiazide (ZESTORETIC) 20-12.5 MG tablet Take 1 tablet by mouth daily. 02/15/19   [provider]  metFORMIN (GLUCOPHAGE-XR) 500 MG 24 hr tablet Take 2 tablets by mouth daily. 07/03/20   [provider]  metoprolol succinate (TOPROL-XL) 25 MG 24 hr tablet Take 25 mg by mouth daily. 05/28/20   [provider]  polyethylene glycol (MIRALAX / GLYCOLAX) 17 g packet Take 17 g by mouth daily. 05/21/20   Benjamine Mola,  Jessica U, DO  predniSONE (DELTASONE) 10 MG tablet Take 10 mg by mouth daily. 11/13/20   [provider]  rosuvastatin (CRESTOR) 5 MG tablet Take 5 mg by mouth daily. 08/26/19   [provider]  sulfamethoxazole-trimethoprim (BACTRIM DS) 800-160 MG tablet Take 1 tablet by mouth 2 (two) times daily. 06/27/20   [provider]  zinc sulfate 220 (50 Zn) MG capsule Take 220 mg by mouth daily. 02/08/19   [provider]  benazepril (LOTENSIN) 40 MG tablet Take 40 mg by mouth daily.  02/02/19  [provider]  zolpidem (AMBIEN) 10 MG tablet Take 10 mg by mouth at bedtime as needed for sleep.   02/02/19  [provider]    Allergies    Hydrochlorothiazide, Lipitor [atorvastatin], Tizanidine hcl, and Viagra [sildenafil citrate]  Review of Systems   Review of Systems  Constitutional:  Negative for fever.  Respiratory:  Positive for shortness of breath. Negative for cough.   Cardiovascular:   Negative for chest pain.  Gastrointestinal:  Negative for abdominal pain, nausea and vomiting.  Musculoskeletal:  Positive for neck pain.  Skin:  Negative for wound.  Neurological:  Negative for weakness and numbness.  All other systems reviewed and are negative.  Physical Exam Updated Vital Signs BP (!) 176/88    Pulse 65    Temp 97.9 F (36.6 C) (Oral)    Resp 18    SpO2 98%   Physical Exam Vitals and nursing note reviewed.  Constitutional:      Appearance: He is well-developed.  HENT:     Head: Normocephalic and atraumatic.  Eyes:     Pupils: Pupils are equal, round, and reactive to light.  Neck:     Comments: No midline tenderness to palpation, step-off, deformity, no muscle hypertrophy or tenderness noted Cardiovascular:     Rate and Rhythm: Normal rate and regular rhythm.     Heart sounds: Normal heart sounds. No murmur heard. Pulmonary:     Effort: Pulmonary effort is normal. No respiratory distress.     Breath sounds: Normal breath sounds. No wheezing.  Abdominal:     General: Bowel sounds are normal.     Palpations: Abdomen is soft.     Tenderness: There is no abdominal tenderness. There is no rebound.  Musculoskeletal:        General: No deformity.     Cervical back: Normal range of motion and neck supple. No rigidity or tenderness.  Lymphadenopathy:     Cervical: No cervical adenopathy.  Skin:    General: Skin is warm and dry.  Neurological:     Mental Status: He is alert and oriented to person, place, and time.     Comments: 5 out of 5 biceps, triceps, deltoid strength bilaterally  Psychiatric:        Mood and Affect: Mood normal.    ED Results / Procedures / Treatments   Labs (all labs ordered are listed, but only abnormal results are displayed) Labs Reviewed  BASIC METABOLIC PANEL - Abnormal; Notable for the following components:      Result Value   Glucose, Bld 206 (*)    BUN 34 (*)    Creatinine, Ser 1.70 (*)    GFR, Estimated 39 (*)    All  other components within normal limits  TROPONIN I (HIGH SENSITIVITY) - Abnormal; Notable for the following components:   Troponin I (High Sensitivity) 31 (*)    All other components within normal limits  TROPONIN I (  HIGH SENSITIVITY) - Abnormal; Notable for the following components:   Troponin I (High Sensitivity) 35 (*)    All other components within normal limits  CBC    EKG EKG Interpretation  Date/Time:  Tuesday December 30 2020 03:58:47 EST Ventricular Rate:  66 PR Interval:  170 QRS Duration: 91 QT Interval:  391 QTC Calculation: 410 R Axis:   4 Text Interpretation: Sinus rhythm Confirmed by Thayer Jew [phone]) on 12/30/2020 4:26:59 AM  Radiology DG Chest 2 View  Result Date: 12/29/2020 CLINICAL DATA:  Chest pain. EXAM: CHEST - 2 VIEW COMPARISON:  Chest x-ray 04/11/2019. FINDINGS: The heart size and mediastinal contours are within normal limits. Both lungs are clear. The visualized skeletal structures are unremarkable. IMPRESSION: No active cardiopulmonary disease. Electronically Signed   By: Ronney Asters M.D.   On: 12/29/2020 18:12   CT Cervical Spine Wo Contrast  Result Date: 12/29/2020 CLINICAL DATA:  Neck trauma (Age >= 65y). No contrast Pt c/o left shoulder pain, left arm pain, SOB. Pain intermittent depending on activity. Onset last Wednesday. EXAM: CT CERVICAL SPINE WITHOUT CONTRAST TECHNIQUE: Multidetector CT imaging of the cervical spine was performed without intravenous contrast. Multiplanar CT image reconstructions were also generated. COMPARISON:  None. FINDINGS: Alignment: Normal. Skull base and vertebrae: Multilevel degenerative changes of the spine. Associated severe C3-C4 and C4-C5 left osseous neural foraminal stenosis. No severe osseous central canal stenosis. No acute fracture. No aggressive appearing focal osseous lesion or focal pathologic process. Soft tissues and spinal canal: No prevertebral fluid or swelling. No visible canal hematoma. Upper chest:  Unremarkable. Other: None. IMPRESSION: 1. No acute displaced fracture or traumatic listhesis of the cervical spine. 2. Multilevel degenerative changes of the spine leading to severe C3-C4 and C4-C5 left osseous neural foraminal stenosis. Electronically Signed   By: Iven Finn M.D.   On: 12/29/2020 18:30    Procedures Procedures   Medications Ordered in ED Medications  acetaminophen (TYLENOL) tablet 1,000 mg (1,000 mg Oral Given 12/30/20 [id number])    ED Course  I have reviewed the triage vital signs and the nursing notes.  Pertinent labs & imaging results that were available during my care of the patient were reviewed by me and considered in my medical decision making (see chart for details).    MDM Rules/Calculators/A&P                          Patient presents with neck pain.  Was referred here from urgent care with concerns for potential anginal equivalent.  He has been in the waiting room greater than 11 hours.  He has no chest pain and no shortness of breath on my evaluation.  I have reviewed his work-up.  Troponins are minimally elevated at 31 and 35.  This is in the setting of chronic renal disease.  No prior for comparison.  He is not having any active chest pain and his EKG today is normal.  Highly doubt anginal equivalent.  Feel that his troponins are a reflection of his kidney disease.  His lab work otherwise notable for mild hyperglycemia without anion gap.  CT scan of the neck shows some degenerative changes of the lower C-spine.  On physical exam, he has no weakness or neurologic change.  This could be the etiology of his pain although it is somewhat atypical.  We will trial increasing his prednisone to 40 mg daily x5 days for anti-inflammatory effect as he is not a good candidate for  NSAIDs.  Additionally, recommend Tylenol.  He is to follow-up closely with his primary physician if not improving.  He was given strict return precautions.  After history, exam, and medical workup I  feel the patient has been appropriately medically screened and is safe for discharge home. Pertinent diagnoses were discussed with the patient. Patient was given return precautions.      Final Clinical Impression(s) / ED Diagnoses Final diagnoses:  Neck pain    Rx / DC Orders ED Discharge Orders          Ordered    predniSONE (DELTASONE) 20 MG tablet  Daily        12/30/20 0434             [name], MD 12/30/20 [phone]

## 2021-02-06 ENCOUNTER — Other Ambulatory Visit: Payer: Medicare Other

## 2021-02-12 ENCOUNTER — Other Ambulatory Visit: Payer: Self-pay | Admitting: Diagnostic Radiology

## 2021-02-12 DIAGNOSIS — Z95828 Presence of other vascular implants and grafts: Secondary | ICD-10-CM

## 2021-02-17 DIAGNOSIS — E119 Type 2 diabetes mellitus without complications: Secondary | ICD-10-CM | POA: Diagnosis not present

## 2021-02-24 ENCOUNTER — Ambulatory Visit: Payer: Medicare Other | Admitting: Podiatry

## 2021-02-24 ENCOUNTER — Other Ambulatory Visit: Payer: Self-pay

## 2021-02-24 ENCOUNTER — Ambulatory Visit: Payer: Medicare Other

## 2021-02-24 ENCOUNTER — Encounter: Payer: Self-pay | Admitting: Podiatry

## 2021-02-24 DIAGNOSIS — E1151 Type 2 diabetes mellitus with diabetic peripheral angiopathy without gangrene: Secondary | ICD-10-CM

## 2021-02-24 DIAGNOSIS — I2699 Other pulmonary embolism without acute cor pulmonale: Secondary | ICD-10-CM | POA: Insufficient documentation

## 2021-02-24 DIAGNOSIS — M353 Polymyalgia rheumatica: Secondary | ICD-10-CM | POA: Insufficient documentation

## 2021-02-24 DIAGNOSIS — M79675 Pain in left toe(s): Secondary | ICD-10-CM

## 2021-02-24 DIAGNOSIS — M2012 Hallux valgus (acquired), left foot: Secondary | ICD-10-CM

## 2021-02-24 DIAGNOSIS — Q828 Other specified congenital malformations of skin: Secondary | ICD-10-CM

## 2021-02-24 DIAGNOSIS — B351 Tinea unguium: Secondary | ICD-10-CM

## 2021-02-24 DIAGNOSIS — E441 Mild protein-calorie malnutrition: Secondary | ICD-10-CM | POA: Insufficient documentation

## 2021-02-24 DIAGNOSIS — Z95828 Presence of other vascular implants and grafts: Secondary | ICD-10-CM | POA: Insufficient documentation

## 2021-02-24 DIAGNOSIS — R3911 Hesitancy of micturition: Secondary | ICD-10-CM | POA: Insufficient documentation

## 2021-02-24 DIAGNOSIS — R269 Unspecified abnormalities of gait and mobility: Secondary | ICD-10-CM | POA: Insufficient documentation

## 2021-02-24 DIAGNOSIS — M79674 Pain in right toe(s): Secondary | ICD-10-CM | POA: Diagnosis not present

## 2021-02-24 DIAGNOSIS — Z86711 Personal history of pulmonary embolism: Secondary | ICD-10-CM | POA: Insufficient documentation

## 2021-02-24 DIAGNOSIS — Z86718 Personal history of other venous thrombosis and embolism: Secondary | ICD-10-CM | POA: Insufficient documentation

## 2021-02-24 DIAGNOSIS — E539 Vitamin B deficiency, unspecified: Secondary | ICD-10-CM | POA: Insufficient documentation

## 2021-02-24 DIAGNOSIS — M2011 Hallux valgus (acquired), right foot: Secondary | ICD-10-CM

## 2021-02-24 DIAGNOSIS — I82462 Acute embolism and thrombosis of left calf muscular vein: Secondary | ICD-10-CM | POA: Insufficient documentation

## 2021-02-24 DIAGNOSIS — K5731 Diverticulosis of large intestine without perforation or abscess with bleeding: Secondary | ICD-10-CM | POA: Insufficient documentation

## 2021-02-24 NOTE — Progress Notes (Signed)
SITUATION Reason for Consult: Evaluation for Prefabricated Diabetic Shoes and Bilateral Custom Diabetic Inserts. Patient / Caregiver Report: Patient would like well fitting shoes  OBJECTIVE DATA: Patient History / Diagnosis:    ICD-10-CM   1. Type II diabetes mellitus with peripheral circulatory disorder (HCC)  E11.51     2. Hallux valgus, acquired, bilateral  M20.11    M20.12       Current or Previous Devices:   Historical user from several years ago, has not worn since  In-Person Foot Examination: Ulcers & Callousing:   5th mets bilateral  Toe / Foot Deformities:   - Hallux valgus   Shoe Size: 13W  ORTHOTIC RECOMMENDATION Recommended Devices: - 1x pair prefabricated PDAC approved diabetic shoes: Patient selects Apex X801M 13W - 3x pair custom-to-patient vacuum formed diabetic insoles.   GOALS OF SHOES AND INSOLES - Reduce shear and pressure - Reduce / Prevent callus formation - Reduce / Prevent ulceration - Protect the fragile healing compromised diabetic foot.  Patient would benefit from diabetic shoes and inserts as patient has diabetes mellitus and the patient has one or more of the following conditions: - History of partial or complete amputation of the foot - History of previous foot ulceration. - History of pre-ulcerative callus - Peripheral neuropathy with evidence of callus formation - Foot deformity - Poor circulation  ACTIONS PERFORMED Patient was casted for insoles via crush box and measured for shoes via brannock device. Procedure was explained and patient tolerated procedure well. All questions were answered and concerns addressed.  PLAN Patient is to ensure treating physician receives and completes diabetic paperwork. Casts and shoe order are to be held until paperwork is received. Once received patient is to be scheduled for fitting in four weeks.

## 2021-02-26 ENCOUNTER — Encounter: Payer: Self-pay | Admitting: *Deleted

## 2021-02-26 ENCOUNTER — Ambulatory Visit
Admission: RE | Admit: 2021-02-26 | Discharge: 2021-02-26 | Disposition: A | Discharge status: Home / Self Care | Payer: Medicare Other | Source: Ambulatory Visit | Attending: Diagnostic Radiology | Admitting: Diagnostic Radiology

## 2021-02-26 ENCOUNTER — Telehealth: Payer: Self-pay | Admitting: *Deleted

## 2021-02-26 DIAGNOSIS — Z86711 Personal history of pulmonary embolism: Secondary | ICD-10-CM | POA: Diagnosis not present

## 2021-02-26 DIAGNOSIS — Z8719 Personal history of other diseases of the digestive system: Secondary | ICD-10-CM | POA: Diagnosis not present

## 2021-02-26 DIAGNOSIS — Z95828 Presence of other vascular implants and grafts: Secondary | ICD-10-CM | POA: Diagnosis not present

## 2021-02-26 DIAGNOSIS — I82431 Acute embolism and thrombosis of right popliteal vein: Secondary | ICD-10-CM | POA: Diagnosis not present

## 2021-02-26 DIAGNOSIS — M7989 Other specified soft tissue disorders: Secondary | ICD-10-CM | POA: Diagnosis not present

## 2021-02-26 HISTORY — PX: IR RADIOLOGIST EVAL & MGMT: IMG5224

## 2021-02-26 NOTE — Progress Notes (Signed)
Chief Complaint: Patient was seen in consultation today for IVC filter management  Referring Physician(s): Wolters,Sharon  History of Present Illness: Thomas Joseph is a 88 y.o. male with history of lower GI bleed, lower extremity DVT and pulmonary embolism.  Patient was hospitalized from 05/05/20-05/20/20 for lower GI bleeding management.  CTA demonstrated active bleeding in the right colon and incidentally noted were pulmonary emboli at the lung bases.  Patient underwent a mesenteric angiography and embolization of a bleeding right colic artery branch.  In addition, an IVC filter was placed at the same time as the mesenteric embolization procedure.  I saw the patient on 08/27/2020 to discuss IVC filter management.  At that time, his bilateral lower extremity venous duplex exam was negative for DVT.  We decided to wait 6 months to reassess his need for an IVC filter.  Patient was accompanied by his wife.  Patient had a bilateral lower extremity venous duplex immediately prior to our clinic visit.  The ultrasound demonstrated a new DVT in the right popliteal vein.  Negative for DVT in the left lower extremity.  The patient had no complaints until I specifically asked him about the right leg.  Patient and wife have noticed mild swelling in the right foot compared to the left.  He has some muscle weakness in the right calf but no significant tightness.  Patient has no other complaints.  Specifically, the patient has no GI complaints and no evidence for recurrent GI bleeding.  Past Medical History:  Diagnosis Date   Anemia    BPH (benign prostatic hyperplasia)    Chronic kidney disease (CKD) stage G3a/A1, moderately decreased glomerular filtration rate (GFR) between 45-59 mL/min/1.73 square meter and albuminuria creatinine ratio less than 30 mg/g ([place]) 03/07/2020   Diabetes mellitus without complication [facility])    ED (erectile dysfunction)    Family history of adverse reaction to anesthesia     son had some problem years ago, pt unsure of what exact problem was   Hypertension    Obesity    Pneumonia     Past Surgical History:  Procedure Laterality Date    LEFT KNEE ARTHROCOPY     COLONOSCOPY WITH PROPOFOL Left 05/07/2020   Procedure: COLONOSCOPY WITH PROPOFOL;  Surgeon: Otis Brace, MD;  Location: [place] [facility];  Service: Gastroenterology;  Laterality: Left;   COLONOSCOPY WITH PROPOFOL N/A 05/11/2020   Procedure: COLONOSCOPY WITH PROPOFOL;  Surgeon: Wilford Corner, MD;  Location: [place];  Service: Endoscopy;  Laterality: N/A;   ESOPHAGOGASTRODUODENOSCOPY N/A 05/06/2020   Procedure: ESOPHAGOGASTRODUODENOSCOPY (EGD);  Surgeon: Arta Silence, MD;  Location: [facility] ENDOSCOPY;  Service: Endoscopy;  Laterality: N/A;   HERNIA REPAIR     IR ANGIOGRAM SELECTIVE EACH ADDITIONAL VESSEL  05/11/2020   IR ANGIOGRAM VISCERAL SELECTIVE  05/11/2020   IR EMBO ART  VEN HEMORR LYMPH EXTRAV  INC GUIDE ROADMAPPING  05/11/2020   IR IVC FILTER PLMT / S&I /IMG GUID/MOD SED  05/11/2020   IR RADIOLOGIST EVAL & MGMT  08/27/2020   IR US GUIDE VASC ACCESS RIGHT  05/11/2020   IR VENOGRAM RENAL BI  05/11/2020   TRANSURETHRAL RESECTION OF PROSTATE N/A 12/26/2014   Procedure: TRANSURETHRAL RESECTION OF THE PROSTATE;  Surgeon: Cleon Gustin, MD;  Location: [facility];  Service: Urology;  Laterality: N/A;    Allergies: Hydrochlorothiazide, Lipitor [atorvastatin], Metoprolol, Sildenafil, Tizanidine hcl, and Viagra [sildenafil citrate]  Medications: Prior to Admission medications   Medication Sig Start Date End Date Taking? Authorizing Provider  acetaminophen (  TYLENOL) 325 MG tablet Take 325-650 mg by mouth every 12 (twelve) hours as needed for mild pain or headache.    [provider]  acetaminophen (TYLENOL) 325 MG tablet 1 tablet as needed    [provider]  amLODipine (NORVASC) 10 MG tablet Take 10 mg by mouth daily.    [provider]  amLODipine (NORVASC) 10 MG tablet 1 tablet     [provider]  Ascorbic Acid (VITAMIN C) 500 MG CAPS Take 1 tablet by mouth daily. 02/08/19   [provider]  Blood Pressure Monitor DEVI  10/23/19   [provider]  cetirizine (ZYRTEC ALLERGY) 10 MG tablet 1 tablet as needed    [provider]  cetirizine (ZYRTEC) 10 MG tablet Take 10 mg by mouth daily as needed for allergies.    [provider]  Cyanocobalamin (VITAMIN B-12) 500 MCG TABS Take 500 mcg by mouth daily. 05/20/20   Joseph ArtVann, Jessica U, DO  docusate sodium (COLACE) 100 MG capsule 1 capsule 11/24/20   [provider]  Iron-FA-B Cmp-C-Biot-Probiotic (FUSION PLUS) CAPS Take 1 capsule by mouth daily. 06/26/20   [provider]  latanoprost (XALATAN) 0.005 % ophthalmic solution Place 1 drop into both eyes at bedtime. 01/19/19   [provider]  latanoprost (XALATAN) 0.005 % ophthalmic solution 1 drop into both eyes at bedtime    [provider]  lisinopril-hydrochlorothiazide (ZESTORETIC) 20-12.5 MG tablet Take 1 tablet by mouth daily. 02/15/19   [provider]  metFORMIN (GLUCOPHAGE-XR) 500 MG 24 hr tablet Take 2 tablets by mouth daily. 07/03/20   [provider]  metoprolol succinate (TOPROL-XL) 25 MG 24 hr tablet Take 25 mg by mouth daily. 05/28/20   [provider]  polyethylene glycol (MIRALAX / GLYCOLAX) 17 g packet Take 17 g by mouth daily. 05/21/20   Joseph ArtVann, Jessica U, DO  predniSONE (DELTASONE) 10 MG tablet Take 10 mg by mouth daily. 11/13/20   [provider]  predniSONE (DELTASONE) 20 MG tablet Take 2 tablets (40 mg total) by mouth daily. 12/30/20   Horton, Mayer Maskerourtney F, MD  Psyllium (METAMUCIL) 48.57 % POWD 1 packet    [provider]  rosuvastatin (CRESTOR) 5 MG tablet Take 5 mg by mouth daily. 08/26/19   [provider]  sulfamethoxazole-trimethoprim (BACTRIM DS) 800-160 MG tablet Take 1 tablet by mouth 2 (two) times daily. 06/27/20   [provider]   vitamin B-12 (CYANOCOBALAMIN) 1000 MCG tablet 1 tablet 05/20/20   [provider]  zinc sulfate 220 (50 Zn) MG capsule Take 220 mg by mouth daily. 02/08/19   [provider]  benazepril (LOTENSIN) 40 MG tablet Take 40 mg by mouth daily.  02/02/19  [provider]  zolpidem (AMBIEN) 10 MG tablet Take 10 mg by mouth at bedtime as needed for sleep.   02/02/19  [provider]     Family History  Problem Relation Age of Onset   CVA Father    Hypertension Father    Leukemia Sister    Thyroid disease Sister    Cancer Brother     Social History   Socioeconomic History   Marital status: Married    Spouse name: Not on file   Number of children: Not on file   Years of education: Not on file   Highest education level: Not on file  Occupational History   Not on file  Tobacco Use   Smoking status: Former    Types: Cigarettes  Quit date: 11/28/1984    Years since quitting: 36.2   Smokeless tobacco: Never  Substance and Sexual Activity   Alcohol use: Not Currently    Comment: occasional glass of wine   Drug use: No   Sexual activity: Not on file  Other Topics Concern   Not on file  Social History Narrative   Not on file   Social Determinants of Health   Financial Resource Strain: Not on file  Food Insecurity: Not on file  Transportation Needs: Not on file  Physical Activity: Not on file  Stress: Not on file  Social Connections: Not on file    ECOG Status: 1 - Symptomatic but completely ambulatory   Review of Systems  Constitutional: Negative.   Cardiovascular:  Positive for leg swelling.  Gastrointestinal: Negative.  Negative for blood in stool.   Vital Signs: There were no vitals taken for this visit.  Physical Exam Constitutional:      Appearance: Normal appearance. He is not ill-appearing.  Pulmonary:     Effort: Pulmonary effort is normal.  Musculoskeletal:        General: Swelling present.     Comments: 3+ pitting edema  in the right ankle. 1+/2+ pitting edema in the left ankle Bilateral calves are soft without swelling.  No thigh swelling  Neurological:     Mental Status: He is alert.       Imaging: US Venous Img Lower Bilateral (DVT)  Result Date: 02/26/2021 CLINICAL DATA:  88 year old with history of lower GI bleed and pulmonary embolism. IVC filter was placed on 05/11/2020. Venous duplex ultrasound was negative for DVT on 08/25/2020. Patient presents for follow-up management of his IVC filter. Patient complains of mild swelling in the right foot compared to the left. EXAM: BILATERAL LOWER EXTREMITY VENOUS DOPPLER ULTRASOUND TECHNIQUE: Gray-scale sonography with graded compression, as well as color Doppler and duplex ultrasound were performed to evaluate the lower extremity deep venous systems from the level of the common femoral vein and including the common femoral, femoral, profunda femoral, popliteal and calf veins including the posterior tibial, peroneal and gastrocnemius veins when visible. The superficial great saphenous vein was also interrogated. Spectral Doppler was utilized to evaluate flow at rest and with distal augmentation maneuvers in the common femoral, femoral and popliteal veins. COMPARISON:  08/27/2020 FINDINGS: RIGHT LOWER EXTREMITY Common Femoral Vein: No evidence of thrombus. Normal compressibility, respiratory phasicity and response to augmentation. Saphenofemoral Junction: No evidence of thrombus. Normal compressibility and flow on color Doppler imaging. Profunda Femoral Vein: No evidence of thrombus. Normal compressibility and flow on color Doppler imaging. Femoral Vein: No evidence of thrombus. Normal compressibility, respiratory phasicity and response to augmentation. Popliteal Vein: Positive for thrombus. Echogenic thrombus in the right popliteal vein with minimal compressibility. No significant color Doppler flow in the right popliteal vein. Calf Veins: Questionable thrombus involving 1  posterior tibial vein. Peroneal veins are not well identified on this examination. Superficial Great Saphenous Vein: No evidence of thrombus. Normal compressibility. Other Findings:  Subcutaneous edema in the right calf. LEFT LOWER EXTREMITY Common Femoral Vein: No evidence of thrombus. Normal compressibility, respiratory phasicity and response to augmentation. Saphenofemoral Junction: No evidence of thrombus. Normal compressibility and flow on color Doppler imaging. Profunda Femoral Vein: No evidence of thrombus. Normal compressibility and flow on color Doppler imaging. Femoral Vein: No evidence of thrombus. Normal compressibility, respiratory phasicity and response to augmentation. Popliteal Vein: No evidence of thrombus. Normal compressibility, respiratory phasicity and response to augmentation. Calf Veins: Visualized left  deep calf veins are patent without thrombus. Superficial Great Saphenous Vein: No evidence of thrombus. Normal compressibility. Other Findings:  None. IMPRESSION: 1. Positive for deep venous thrombosis in the right popliteal vein. Questionable thrombus involving a right posterior tibial vein. 2.  Negative for deep venous thrombosis in left lower extremity. Electronically Signed   By: Markus Daft M.D.   On: 02/26/2021 09:59    Labs:  CBC: Recent Labs    05/19/20 0327 05/20/20 0320 05/26/20 1240 12/29/20 1654  WBC 7.9 7.4 7.6 5.5  HGB 7.7* 7.8* 9.9* 13.7  HCT 24.4* 24.5* 32.0* 42.2  PLT 223 247 293 185    COAGS: Recent Labs    05/05/20 1155  INR 1.1    BMP: Recent Labs    05/18/20 0253 05/19/20 0327 05/26/20 1240 12/29/20 1654  NA 137 136 136 138  K 3.9 3.6 5.6* 4.2  CL 107 107 106 102  CO2 26 26 23 26   GLUCOSE 164* 133* 112* 206*  BUN 10 10 17  34*  CALCIUM 7.9* 7.9* 8.6* 9.5  CREATININE 1.39* 1.40* 1.49* 1.70*  GFRNONAA 50* 50* 46* 39*    LIVER FUNCTION TESTS: Recent Labs    05/13/20 0520 05/14/20 0252 05/15/20 0340 05/26/20 1240  BILITOT 0.7 0.7  0.6 1.4*  AST 18 43* 35 46*  ALT 11 17 17 14   ALKPHOS 34* 46 54 97  PROT 3.5* 3.8* 3.7* 5.6*  ALBUMIN 2.0* 2.1* 2.0* 2.8*    TUMOR MARKERS: No results for input(s): AFPTM, CEA, CA199, CHROMGRNA in the last 8760 hours.  Assessment and Plan:  88 year old with history of lower extremity DVT and pulmonary embolism.  IVC filter was placed on 05/11/2020 due to GI bleeding.  Patient had no evidence for DVT on 08/27/2020 but now presents with a mildly symptomatic DVT in the right popliteal vein.  Discussed management of DVT with patient and wife.  Explained that standard therapy would include anticoagulation but we would need to weigh the risks and benefits of anticoagulation based on his history GI bleeding.  Currently the patient is not having any GI bleeding or bowel issues.  Explained to the patient and wife that anticoagulation may decrease the risk of the clot enlarging or propagating.  Currently, the patient has bilateral ankle and foot swelling, right side greater than left.  I suspect that the increased swelling on the right foot is related to the right lower extremity DVT.  Not clear if the patient's PCP will want to give the patient anticoagulation based on his history of GI bleeding.  Therefore, we decide to declare the IVC filter permanent and will not plan for filter retrieval.  Will set up an appointment with the patient's primary care physician, Dr. Stephanie Acre, and they can discuss management of the new right lower extremity DVT.  No plans for additional IR follow-up at this time.   Electronically Signed: Burman Riis 02/26/2021, 10:18 AM   I spent a total of    15 Minutes in face to face in clinical consultation, greater than 50% of which was counseling/coordinating care for DVT and IVC filter management. patient ID: Thomas Joseph, male   DOB: 1933/06/03, 88 y.o.   MRN: [id number]

## 2021-02-26 NOTE — Telephone Encounter (Signed)
Mr. Rosenstock was seen in follow up with Dr. Anselm Pancoast today and they have decided the IVC Filter remain permanent.  I have called Dr. Salome Spotted made an appt for Mr. Bruss to f/u with her and they can discuss management of the new right lower extremity DVT.  No plans for additional IR follow-up at this time.Cathren Harsh

## 2021-02-27 DIAGNOSIS — N182 Chronic kidney disease, stage 2 (mild): Secondary | ICD-10-CM | POA: Diagnosis not present

## 2021-02-27 DIAGNOSIS — I10 Essential (primary) hypertension: Secondary | ICD-10-CM | POA: Diagnosis not present

## 2021-02-27 DIAGNOSIS — E1122 Type 2 diabetes mellitus with diabetic chronic kidney disease: Secondary | ICD-10-CM | POA: Diagnosis not present

## 2021-03-01 NOTE — Progress Notes (Signed)
°  Subjective:  Patient ID: Thomas Joseph, male    DOB: June 25, 1933,  MRN: [id number]  88 y.o. male presents with at risk foot care. Pt has h/o NIDDM with PAD and painful elongated mycotic toenails 1-5 bilaterally which are tender when wearing enclosed shoe gear. Pain is relieved with periodic professional debridement..    Patient did not check blood glucose this morning.  PCP: Jonathon Jordan, MD and last visit was: December 19, 2020.  Review of Systems: Negative except as noted in the HPI.   Allergies  Allergen Reactions   Hydrochlorothiazide Other (See Comments)    Reaction not recalled, believes it just made him urinate very frequently   Lipitor [Atorvastatin] Other (See Comments)    Muscle pain   Metoprolol     Other reaction(s): high BP, urinary symptoms   Sildenafil     Other reaction(s): TACHYCARDIA   Tizanidine Hcl Other (See Comments)   Viagra [Sildenafil Citrate] Palpitations    Objective:  There were no vitals filed for this visit. Constitutional Patient is a pleasant 88 y.o. African American male in NAD. AAO x 3.  Vascular Capillary fill time to digits <3 seconds b/l lower extremities. Faintly palpable DP pulse(s) b/l lower extremities. Nonpalpable PT pulse(s) b/l lower extremities. Pedal hair absent. Lower extremity skin temperature gradient within normal limits. No pain with calf compression b/l. No cyanosis or clubbing noted. +1 pitting edema bilateral ankles.  Neurologic Normal speech. Protective sensation intact 5/5 intact bilaterally with 10g monofilament b/l. Vibratory sensation diminished b/l.  Dermatologic Pedal skin thin and atrophic b/l LE. No open wounds b/l LE. No interdigital macerations noted b/l LE. Toenails 3-5 bilaterally, L hallux, and R hallux elongated, discolored, dystrophic, thickened, and crumbly with subungual debris and tenderness to dorsal palpation. Anonychia noted bilateral 2nd toes. Nailbed(s) epithelialized.  Porokeratotic lesion(s) submet  head 5 left foot. No erythema, no edema, no drainage, no fluctuance.  Orthopedic: Normal muscle strength 5/5 to all lower extremity muscle groups bilaterally. Normal muscle strength 5/5 to all lower extremity muscle groups bilaterally. Palpable exostosis noted dorsal midfoot joint of left foot. HAV with bunion deformity noted b/l LE.Marland Kitchen No pain, crepitus or joint limitation noted with ROM b/l LE.  Patient ambulates independently without assistive aids.   Hemoglobin A1C Latest Ref Rng & Units 05/05/2020  HGBA1C 4.8 - 5.6 % 7.2(H)  Some recent data might be hidden   Assessment:   1. Pain due to onychomycosis of toenails of both feet   2. Porokeratosis   3. Type II diabetes mellitus with peripheral circulatory disorder [facility])    Plan:  Patient was evaluated and treated and all questions answered. Consent given for treatment as described below: -Continue diabetic foot care principles: inspect feet daily, monitor glucose as recommended by PCP and/or Endocrinologist, and follow prescribed diet per PCP, Endocrinologist and/or dietician. -Toenails bilateral great toes and 3-5 bilaterally debrided in length and girth without iatrogenic bleeding with sterile nail nipper and dremel.  -Painful porokeratotic lesion(s) submet head 5 left foot pared and enucleated with sterile scalpel blade without incident. Total number of lesions debrided=1. -Patient/POA to call should there be question/concern in the interim.  Return in about 3 months (around 05/24/2021).  Marzetta Board, DPM

## 2021-03-05 DIAGNOSIS — I82401 Acute embolism and thrombosis of unspecified deep veins of right lower extremity: Secondary | ICD-10-CM | POA: Diagnosis not present

## 2021-03-10 ENCOUNTER — Telehealth: Payer: Self-pay | Admitting: Hematology and Oncology

## 2021-03-10 NOTE — Telephone Encounter (Signed)
Scheduled appt per 2/24 referral. Pt is aware of appt date and time. Pt is aware to arrive 15 mins prior to appt time and to bring and updated insurance card. Pt is aware of appt location.   °

## 2021-03-10 NOTE — Progress Notes (Signed)
[place] CONSULT NOTE  Patient Care Team: Jonathon Jordan, MD as PCP - General (Family Medicine)  CHIEF COMPLAINTS/PURPOSE OF CONSULTATION:  Newly diagnosed recurrent DVT and history of GI bleed  HISTORY OF PRESENTING ILLNESS:  Thomas Joseph 88 y.o. male is here because of recent diagnosis of recurrent DVT and history of GI bleed. He presents to the clinic today for initial evaluation and discussion of treatment options.  He was originally diagnosed with peroneal vein DVT in 2022.  At the same time he was having GI bleeding and therefore anticoagulation was not recommended.  He underwent filter placement.  His bleeding got controlled and he was watched and monitored.  He underwent an evaluation for the filter and they performed an ultrasound of the leg and found extensive DVTs in the right popliteal vein, right posterior tibial vein.  He was referred to [place] for discussion regarding anticoagulation.  He noticed increased swelling of his right leg.  He no longer has any signs or symptoms of GI bleeding.  I reviewed his records extensively and collaborated the history with the patient.  MEDICAL HISTORY:  Past Medical History:  Diagnosis Date   Anemia    BPH (benign prostatic hyperplasia)    Chronic kidney disease (CKD) stage G3a/A1, moderately decreased glomerular filtration rate (GFR) between 45-59 mL/min/1.73 square meter and albuminuria creatinine ratio less than 30 mg/g ([place]) 03/07/2020   Diabetes mellitus without complication [facility])    ED (erectile dysfunction)    Family history of adverse reaction to anesthesia    son had some problem years ago, pt unsure of what exact problem was   Hypertension    Obesity    Pneumonia     SURGICAL HISTORY: Past Surgical History:  Procedure Laterality Date    LEFT KNEE ARTHROCOPY     COLONOSCOPY WITH PROPOFOL Left 05/07/2020   Procedure: COLONOSCOPY WITH PROPOFOL;  Surgeon: Otis Brace, MD;  Location: [facility];   Service: Gastroenterology;  Laterality: Left;   COLONOSCOPY WITH PROPOFOL N/A 05/11/2020   Procedure: COLONOSCOPY WITH PROPOFOL;  Surgeon: Wilford Corner, MD;  Location: [place];  Service: Endoscopy;  Laterality: N/A;   ESOPHAGOGASTRODUODENOSCOPY N/A 05/06/2020   Procedure: ESOPHAGOGASTRODUODENOSCOPY (EGD);  Surgeon: Arta Silence, MD;  Location: [facility] ENDOSCOPY;  Service: Endoscopy;  Laterality: N/A;   HERNIA REPAIR     IR ANGIOGRAM SELECTIVE EACH ADDITIONAL VESSEL  05/11/2020   IR ANGIOGRAM VISCERAL SELECTIVE  05/11/2020   IR EMBO ART  VEN HEMORR LYMPH EXTRAV  INC GUIDE ROADMAPPING  05/11/2020   IR IVC FILTER PLMT / S&I /IMG GUID/MOD SED  05/11/2020   IR RADIOLOGIST EVAL & MGMT  08/27/2020   IR RADIOLOGIST EVAL & MGMT  02/26/2021   IR US GUIDE VASC ACCESS RIGHT  05/11/2020   IR VENOGRAM RENAL BI  05/11/2020   TRANSURETHRAL RESECTION OF PROSTATE N/A 12/26/2014   Procedure: TRANSURETHRAL RESECTION OF THE PROSTATE;  Surgeon: Cleon Gustin, MD;  Location: [facility];  Service: Urology;  Laterality: N/A;    SOCIAL HISTORY: Social History   Socioeconomic History   Marital status: Married    Spouse name: Not on file   Number of children: Not on file   Years of education: Not on file   Highest education level: Not on file  Occupational History   Not on file  Tobacco Use   Smoking status: Former    Types: Cigarettes    Quit date: 11/28/1984    Years since quitting: 36.3   Smokeless tobacco:  Never  Substance and Sexual Activity   Alcohol use: Not Currently    Comment: occasional glass of wine   Drug use: No   Sexual activity: Not on file  Other Topics Concern   Not on file  Social History Narrative   Not on file   Social Determinants of Health   Financial Resource Strain: Not on file  Food Insecurity: Not on file  Transportation Needs: Not on file  Physical Activity: Not on file  Stress: Not on file  Social Connections: Not on file  Intimate Partner Violence: Not on file     FAMILY HISTORY: Family History  Problem Relation Age of Onset   CVA Father    Hypertension Father    Leukemia Sister    Thyroid disease Sister    Cancer Brother     ALLERGIES:  is allergic to hydrochlorothiazide, lipitor [atorvastatin], metoprolol, sildenafil, tizanidine hcl, and viagra [sildenafil citrate].  MEDICATIONS:  Current Outpatient Medications  Medication Sig Dispense Refill   RIVAROXABAN (XARELTO) VTE STARTER PACK (15 & 20 MG) Follow package directions: Take one 15mg  tablet by mouth twice a day. On day 22, switch to one 20mg  tablet once a day. Take with food. 51 each 0   acetaminophen (TYLENOL) 325 MG tablet Take 325-650 mg by mouth every 12 (twelve) hours as needed for mild pain or headache.     acetaminophen (TYLENOL) 325 MG tablet 1 tablet as needed     amLODipine (NORVASC) 10 MG tablet Take 10 mg by mouth daily.     amLODipine (NORVASC) 10 MG tablet 1 tablet     Ascorbic Acid (VITAMIN C) 500 MG CAPS Take 1 tablet by mouth daily.     Blood Pressure Monitor DEVI      cetirizine (ZYRTEC ALLERGY) 10 MG tablet 1 tablet as needed     cetirizine (ZYRTEC) 10 MG tablet Take 10 mg by mouth daily as needed for allergies.     Cyanocobalamin (VITAMIN B-12) 500 MCG TABS Take 500 mcg by mouth daily.     docusate sodium (COLACE) 100 MG capsule 1 capsule     Iron-FA-B Cmp-C-Biot-Probiotic (FUSION PLUS) CAPS Take 1 capsule by mouth daily.     latanoprost (XALATAN) 0.005 % ophthalmic solution Place 1 drop into both eyes at bedtime.     latanoprost (XALATAN) 0.005 % ophthalmic solution 1 drop into both eyes at bedtime     lisinopril-hydrochlorothiazide (ZESTORETIC) 20-12.5 MG tablet Take 1 tablet by mouth daily.     metFORMIN (GLUCOPHAGE-XR) 500 MG 24 hr tablet Take 2 tablets by mouth daily.     metoprolol succinate (TOPROL-XL) 25 MG 24 hr tablet Take 25 mg by mouth daily.     polyethylene glycol (MIRALAX / GLYCOLAX) 17 g packet Take 17 g by mouth daily. 14 each 0   predniSONE  (DELTASONE) 10 MG tablet Take 10 mg by mouth daily.     predniSONE (DELTASONE) 20 MG tablet Take 2 tablets (40 mg total) by mouth daily. 10 tablet 0   Psyllium (METAMUCIL) 48.57 % POWD 1 packet     rosuvastatin (CRESTOR) 5 MG tablet Take 5 mg by mouth daily.     sulfamethoxazole-trimethoprim (BACTRIM DS) 800-160 MG tablet Take 1 tablet by mouth 2 (two) times daily.     vitamin B-12 (CYANOCOBALAMIN) 1000 MCG tablet 1 tablet     zinc sulfate 220 (50 Zn) MG capsule Take 220 mg by mouth daily.     No current facility-administered medications for this visit.  REVIEW OF SYSTEMS:   Constitutional: Denies fevers, chills or abnormal night sweats Lower extremity swelling especially the right leg All other systems were reviewed with the patient and are negative.  PHYSICAL EXAMINATION: ECOG PERFORMANCE STATUS: 1 - Symptomatic but completely ambulatory  Vitals:   03/11/21 1536  BP: (!) 151/68  Pulse: 67  Resp: 18  Temp: 97.7 F (36.5 C)  SpO2: 100%   Filed Weights   03/11/21 1536  Weight: 221 lb 14.4 oz (100.7 kg)       LABORATORY DATA:  I have reviewed the data as listed Lab Results  Component Value Date   WBC 5.5 12/29/2020   HGB 13.7 12/29/2020   HCT 42.2 12/29/2020   MCV 96.1 12/29/2020   PLT 185 12/29/2020   Lab Results  Component Value Date   NA 138 12/29/2020   K 4.2 12/29/2020   CL 102 12/29/2020   CO2 26 12/29/2020    RADIOGRAPHIC STUDIES: I have personally reviewed the radiological reports and agreed with the findings in the report.  ASSESSMENT AND PLAN:  History of DVT (deep vein thrombosis) May 2022: DVT left leg (no anticoagulation: GI bleeding: Filter placement) 02/26/2021: DVT right popliteal vein and right posterior tibial vein  I discussed with the patient the causes of recurrent blood clots and I recommended that we obtain lupus anticoagulant panel as this will determine his duration of anticoagulation.  Risk of GI bleeding: I discussed with him  that there is always a potential for GI bleeding with anticoagulation.  Therefore I instructed him to call us if he develops any signs or symptoms of bleeding.  Instructed him to stop the Xarelto ASAP if that happens.  Recheck labs and follow-up in 1 month to see his tolerance to Xarelto. I will call him with the results of the antiphospholipid Tabori panel. Because there is no family history of blood clots, I do not recommend doing extensive hypercoagulability work-up.  Duration of anticoagulation: 6 months (if antiphospholipid Tabori's are negative) Plan is to obtain ultrasound in 3 months and at 6 months for further evaluation   All questions were answered. The patient knows to call the clinic with any problems, questions or concerns.   Rulon Eisenmenger, MD, MPH 03/11/2021    I, Thana Ates, am acting as scribe for Nicholas Lose, MD.  I have reviewed the above documentation for accuracy and completeness, and I agree with the above.

## 2021-03-11 ENCOUNTER — Inpatient Hospital Stay: Payer: Medicare Other | Attending: Hematology and Oncology | Admitting: Hematology and Oncology

## 2021-03-11 ENCOUNTER — Inpatient Hospital Stay: Payer: Medicare Other

## 2021-03-11 ENCOUNTER — Other Ambulatory Visit: Payer: Self-pay

## 2021-03-11 VITALS — BP 151/68 | HR 67 | Temp 97.7°F | Resp 18 | Ht 73.0 in | Wt 221.9 lb

## 2021-03-11 DIAGNOSIS — Z806 Family history of leukemia: Secondary | ICD-10-CM | POA: Insufficient documentation

## 2021-03-11 DIAGNOSIS — Z8349 Family history of other endocrine, nutritional and metabolic diseases: Secondary | ICD-10-CM | POA: Insufficient documentation

## 2021-03-11 DIAGNOSIS — Z8249 Family history of ischemic heart disease and other diseases of the circulatory system: Secondary | ICD-10-CM | POA: Insufficient documentation

## 2021-03-11 DIAGNOSIS — M7989 Other specified soft tissue disorders: Secondary | ICD-10-CM | POA: Diagnosis not present

## 2021-03-11 DIAGNOSIS — Z87891 Personal history of nicotine dependence: Secondary | ICD-10-CM | POA: Diagnosis not present

## 2021-03-11 DIAGNOSIS — Z823 Family history of stroke: Secondary | ICD-10-CM | POA: Insufficient documentation

## 2021-03-11 DIAGNOSIS — Z86718 Personal history of other venous thrombosis and embolism: Secondary | ICD-10-CM | POA: Diagnosis not present

## 2021-03-11 DIAGNOSIS — N1831 Chronic kidney disease, stage 3a: Secondary | ICD-10-CM | POA: Diagnosis not present

## 2021-03-11 DIAGNOSIS — I82462 Acute embolism and thrombosis of left calf muscular vein: Secondary | ICD-10-CM

## 2021-03-11 DIAGNOSIS — Z809 Family history of malignant neoplasm, unspecified: Secondary | ICD-10-CM | POA: Diagnosis not present

## 2021-03-11 DIAGNOSIS — Z79899 Other long term (current) drug therapy: Secondary | ICD-10-CM | POA: Insufficient documentation

## 2021-03-11 DIAGNOSIS — I82441 Acute embolism and thrombosis of right tibial vein: Secondary | ICD-10-CM | POA: Diagnosis not present

## 2021-03-11 DIAGNOSIS — I82431 Acute embolism and thrombosis of right popliteal vein: Secondary | ICD-10-CM | POA: Insufficient documentation

## 2021-03-11 DIAGNOSIS — Z8719 Personal history of other diseases of the digestive system: Secondary | ICD-10-CM | POA: Insufficient documentation

## 2021-03-11 LAB — CBC WITH DIFFERENTIAL (CANCER CENTER ONLY)
Abs Immature Granulocytes: 0.02 10*3/uL (ref 0.00–0.07)
Basophils Absolute: 0 10*3/uL (ref 0.0–0.1)
Basophils Relative: 1 %
Eosinophils Absolute: 0.1 10*3/uL (ref 0.0–0.5)
Eosinophils Relative: 2 %
HCT: 38.1 % — ABNORMAL LOW (ref 39.0–52.0)
Hemoglobin: 13.1 g/dL (ref 13.0–17.0)
Immature Granulocytes: 0 %
Lymphocytes Relative: 21 %
Lymphs Abs: 1.2 10*3/uL (ref 0.7–4.0)
MCH: 32 pg (ref 26.0–34.0)
MCHC: 34.4 g/dL (ref 30.0–36.0)
MCV: 93.2 fL (ref 80.0–100.0)
Monocytes Absolute: 0.5 10*3/uL (ref 0.1–1.0)
Monocytes Relative: 10 %
Neutro Abs: 3.7 10*3/uL (ref 1.7–7.7)
Neutrophils Relative %: 66 %
Platelet Count: 210 10*3/uL (ref 150–400)
RBC: 4.09 MIL/uL — ABNORMAL LOW (ref 4.22–5.81)
RDW: 12.9 % (ref 11.5–15.5)
WBC Count: 5.6 10*3/uL (ref 4.0–10.5)
nRBC: 0 % (ref 0.0–0.2)

## 2021-03-11 MED ORDER — RIVAROXABAN (XARELTO) VTE STARTER PACK (15 & 20 MG): ORAL_TABLET | ORAL | 0 refills | Status: DC

## 2021-03-11 NOTE — Assessment & Plan Note (Signed)
May 2022: DVT left leg (no anticoagulation: GI bleeding: Filter placement) ?02/26/2021: DVT right popliteal vein and right posterior tibial vein ? ?I discussed with the patient the causes of recurrent blood clots and I recommended that we obtain lupus anticoagulant panel as this will determine his duration of anticoagulation. ? ?Risk of GI bleeding: I discussed with him that there is always a potential for GI bleeding with anticoagulation.  Therefore I instructed him to call us if he develops any signs or symptoms of bleeding.  Instructed him to stop the Xarelto ASAP if that happens. ? ?Recheck labs and follow-up in 1 month to see his tolerance to Xarelto. ?I will call him with the results of the antiphospholipid Tabori panel. ?Because there is no family history of blood clots, I do not recommend doing extensive hypercoagulability work-up. ? ?Duration of anticoagulation: 6 months (if antiphospholipid Tabori's are negative) ?Plan is to obtain ultrasound in 3 months and at 6 months for further evaluation ?

## 2021-03-12 LAB — LUPUS ANTICOAGULANT PANEL
DRVVT: 40.7 s (ref 0.0–47.0)
PTT Lupus Anticoagulant: 34.4 s (ref 0.0–43.5)

## 2021-03-13 LAB — BETA-2-GLYCOPROTEIN I ABS, IGG/M/A
Beta-2 Glyco I IgG: <9 GPI IgG units (ref 0–20)
Beta-2-Glycoprotein I IgA: <9 GPI IgA units (ref 0–25)
Beta-2-Glycoprotein I IgM: <9 GPI IgM units (ref 0–32)

## 2021-03-13 LAB — CARDIOLIPIN ANTIBODIES, IGG, IGM, IGA
Anticardiolipin IgA: <9 APL U/mL (ref 0–11)
Anticardiolipin IgG: <9 GPL U/mL (ref 0–14)
Anticardiolipin IgM: <9 MPL U/mL (ref 0–12)

## 2021-03-16 ENCOUNTER — Telehealth: Payer: Self-pay | Admitting: Podiatry

## 2021-03-16 NOTE — Telephone Encounter (Signed)
Re-faxed.

## 2021-03-16 NOTE — Telephone Encounter (Signed)
Received call from Candace @ pcp Dr Ali Lowe office she talked with you last week and was to have the diabetic shoe paperwork faxed. She said you probably faxed it but she did not get it. Could you please refax it or email to her. ?The fax # is [phone] and her email is [email or web address] ?

## 2021-04-06 ENCOUNTER — Other Ambulatory Visit: Payer: Self-pay | Admitting: *Deleted

## 2021-04-07 ENCOUNTER — Telehealth: Payer: Self-pay | Admitting: *Deleted

## 2021-04-07 ENCOUNTER — Other Ambulatory Visit: Payer: Self-pay | Admitting: *Deleted

## 2021-04-07 DIAGNOSIS — I82462 Acute embolism and thrombosis of left calf muscular vein: Secondary | ICD-10-CM

## 2021-04-07 NOTE — Telephone Encounter (Signed)
Per MD request, pt f/u appt rescheduled to 04/16/21 due to lupus anticoagulation lab needing 5-7 business days to process.  Appt changed.  RN attempt x1 to contact pt.  No answer- LVM with updated appt information.  ?

## 2021-04-08 ENCOUNTER — Ambulatory Visit: Payer: Medicare Other | Admitting: Hematology and Oncology

## 2021-04-08 ENCOUNTER — Inpatient Hospital Stay: Payer: Medicare Other

## 2021-04-08 ENCOUNTER — Other Ambulatory Visit: Payer: Self-pay

## 2021-04-08 DIAGNOSIS — I82462 Acute embolism and thrombosis of left calf muscular vein: Secondary | ICD-10-CM

## 2021-04-08 DIAGNOSIS — Z8719 Personal history of other diseases of the digestive system: Secondary | ICD-10-CM | POA: Diagnosis not present

## 2021-04-08 DIAGNOSIS — N1831 Chronic kidney disease, stage 3a: Secondary | ICD-10-CM | POA: Diagnosis not present

## 2021-04-08 DIAGNOSIS — Z8349 Family history of other endocrine, nutritional and metabolic diseases: Secondary | ICD-10-CM | POA: Diagnosis not present

## 2021-04-08 DIAGNOSIS — I82431 Acute embolism and thrombosis of right popliteal vein: Secondary | ICD-10-CM | POA: Diagnosis not present

## 2021-04-08 DIAGNOSIS — Z79899 Other long term (current) drug therapy: Secondary | ICD-10-CM | POA: Diagnosis not present

## 2021-04-08 DIAGNOSIS — Z87891 Personal history of nicotine dependence: Secondary | ICD-10-CM | POA: Diagnosis not present

## 2021-04-08 DIAGNOSIS — M7989 Other specified soft tissue disorders: Secondary | ICD-10-CM | POA: Diagnosis not present

## 2021-04-08 DIAGNOSIS — Z809 Family history of malignant neoplasm, unspecified: Secondary | ICD-10-CM | POA: Diagnosis not present

## 2021-04-08 DIAGNOSIS — Z86718 Personal history of other venous thrombosis and embolism: Secondary | ICD-10-CM | POA: Diagnosis not present

## 2021-04-08 DIAGNOSIS — Z823 Family history of stroke: Secondary | ICD-10-CM | POA: Diagnosis not present

## 2021-04-08 DIAGNOSIS — I82441 Acute embolism and thrombosis of right tibial vein: Secondary | ICD-10-CM | POA: Diagnosis not present

## 2021-04-08 DIAGNOSIS — Z806 Family history of leukemia: Secondary | ICD-10-CM | POA: Diagnosis not present

## 2021-04-08 DIAGNOSIS — Z8249 Family history of ischemic heart disease and other diseases of the circulatory system: Secondary | ICD-10-CM | POA: Diagnosis not present

## 2021-04-09 LAB — LUPUS ANTICOAGULANT PANEL
DRVVT: 55.8 s — ABNORMAL HIGH (ref 0.0–47.0)
PTT Lupus Anticoagulant: 36.4 s (ref 0.0–43.5)

## 2021-04-09 LAB — DRVVT MIX: dRVVT Mix: 52.4 s — ABNORMAL HIGH (ref 0.0–40.4)

## 2021-04-09 LAB — DRVVT CONFIRM: dRVVT Confirm: 1.1 ratio (ref 0.8–1.2)

## 2021-04-15 NOTE — Progress Notes (Signed)
? ?Patient Care Team: ?Jonathon Jordan, MD as PCP - General (Family Medicine) ? ?DIAGNOSIS:  ?Encounter Diagnoses  ?Name Primary?  ? History of DVT (deep vein thrombosis) Yes  ? Acute deep vein thrombosis (DVT) of calf muscle vein of left lower extremity ([place])   ? ?CHIEF COMPLIANT: Follow up to discuss blood work.  ? ?INTERVAL HISTORY: Thomas Joseph is a 88 y.o. with the above mention  lupus anticoagulation lab.He presents to the clinic today for labs and follow-up. He denies no bleeding. Complains of some pain and swelling in ankles, but manageable. ? ? ?ALLERGIES:  is allergic to hydrochlorothiazide, lipitor [atorvastatin], metoprolol, sildenafil, tizanidine hcl, and viagra [sildenafil citrate]. ? ?MEDICATIONS:  ?Current Outpatient Medications  ?Medication Sig Dispense Refill  ? RIVAROXABAN (XARELTO) VTE STARTER PACK (15 & 20 MG) Follow package directions: Take one 15mg  tablet by mouth twice a day. On day 22, switch to one 20mg  tablet once a day. Take with food. 30 each 4  ? acetaminophen (TYLENOL) 325 MG tablet Take 325-650 mg by mouth every 12 (twelve) hours as needed for mild pain or headache.    ? acetaminophen (TYLENOL) 325 MG tablet 1 tablet as needed    ? amLODipine (NORVASC) 10 MG tablet Take 10 mg by mouth daily.    ? amLODipine (NORVASC) 10 MG tablet 1 tablet    ? Ascorbic Acid (VITAMIN C) 500 MG CAPS Take 1 tablet by mouth daily.    ? Blood Pressure Monitor DEVI     ? cetirizine (ZYRTEC ALLERGY) 10 MG tablet 1 tablet as needed    ? cetirizine (ZYRTEC) 10 MG tablet Take 10 mg by mouth daily as needed for allergies.    ? Cyanocobalamin (VITAMIN B-12) 500 MCG TABS Take 500 mcg by mouth daily.    ? docusate sodium (COLACE) 100 MG capsule 1 capsule    ? Iron-FA-B Cmp-C-Biot-Probiotic (FUSION PLUS) CAPS Take 1 capsule by mouth daily.    ? latanoprost (XALATAN) 0.005 % ophthalmic solution Place 1 drop into both eyes at bedtime.    ? latanoprost (XALATAN) 0.005 % ophthalmic solution 1 drop into both eyes  at bedtime    ? lisinopril-hydrochlorothiazide (ZESTORETIC) 20-12.5 MG tablet Take 1 tablet by mouth daily.    ? metFORMIN (GLUCOPHAGE-XR) 500 MG 24 hr tablet Take 2 tablets by mouth daily.    ? metoprolol succinate (TOPROL-XL) 25 MG 24 hr tablet Take 25 mg by mouth daily.    ? polyethylene glycol (MIRALAX / GLYCOLAX) 17 g packet Take 17 g by mouth daily. 14 each 0  ? predniSONE (DELTASONE) 10 MG tablet Take 10 mg by mouth daily.    ? predniSONE (DELTASONE) 20 MG tablet Take 2 tablets (40 mg total) by mouth daily. 10 tablet 0  ? Psyllium (METAMUCIL) 48.57 % POWD 1 packet    ? rosuvastatin (CRESTOR) 5 MG tablet Take 5 mg by mouth daily.    ? sulfamethoxazole-trimethoprim (BACTRIM DS) 800-160 MG tablet Take 1 tablet by mouth 2 (two) times daily.    ? vitamin B-12 (CYANOCOBALAMIN) 1000 MCG tablet 1 tablet    ? zinc sulfate 220 (50 Zn) MG capsule Take 220 mg by mouth daily.    ? ?No current facility-administered medications for this visit.  ? ? ?PHYSICAL EXAMINATION: ?ECOG PERFORMANCE STATUS: 1 - Symptomatic but completely ambulatory ? ?Vitals:  ? 04/16/21 1337  ?BP: (!) 141/76  ?Pulse: 70  ?Resp: 18  ?Temp: 97.9 ?F (36.6 ?C)  ?SpO2: 99%  ? ?Filed Weights  ?  04/16/21 1337  ?Weight: 221 lb (100.2 kg)  ? ?  ? ?LABORATORY DATA:  ?I have reviewed the data as listed ? ?  Latest Ref Rng & Units 12/29/2020  ?  4:54 PM 05/26/2020  ? 12:40 PM 05/19/2020  ?  3:27 AM  ?CMP  ?Glucose 70 - 99 mg/dL 206   112   133    ?BUN 8 - 23 mg/dL 34   17   10    ?Creatinine 0.61 - 1.24 mg/dL 1.70   1.49   1.40    ?Sodium 135 - 145 mmol/L 138   136   136    ?Potassium 3.5 - 5.1 mmol/L 4.2   5.6   3.6    ?Chloride 98 - 111 mmol/L 102   106   107    ?CO2 22 - 32 mmol/L 26   23   26     ?Calcium 8.9 - 10.3 mg/dL 9.5   8.6   7.9    ?Total Protein 6.5 - 8.1 g/dL  5.6     ?Total Bilirubin 0.3 - 1.2 mg/dL  1.4     ?Alkaline Phos 38 - 126 U/L  97     ?AST 15 - 41 U/L  46     ?ALT 0 - 44 U/L  14     ? ? ?Lab Results  ?Component Value Date  ? WBC 5.6  03/11/2021  ? HGB 13.1 03/11/2021  ? HCT 38.1 (L) 03/11/2021  ? MCV 93.2 03/11/2021  ? PLT 210 03/11/2021  ? NEUTROABS 3.7 03/11/2021  ? ? ?ASSESSMENT & PLAN:  ?History of DVT (deep vein thrombosis) ?Risk of GI bleeding ? ?Acute deep vein thrombosis (DVT) of calf muscle vein of left lower extremity ([place]) ?May 2022: DVT left leg (no anticoagulation: GI bleeding: Filter placement) ?02/26/2021: DVT right popliteal vein and right posterior tibial vein ? ?Lab review 04/09/2021: No lupus anticoagulant was detected ?Duration of anticoagulation: 6 months ?Plan to obtain ultrasound for further evaluation at 6 months before discontinuation ?Patient has an IVC filter in place and I sent a message to Dr. Anselm Pancoast to see if it could be removed. ? ?Return to clinic in 6 months after ultrasound of the legs and follow-up with me. ? ? ? ?No orders of the defined types were placed in this encounter. ? ?The patient has a good understanding of the overall plan. he agrees with it. he will call with any problems that may develop before the next visit here. ?Total time spent: 20 mins including face to face time and time spent for planning, charting and co-ordination of care ? ? Harriette Ohara, MD ?04/16/21 ? ? ? I Gardiner Coins am scribing for Dr. Lindi Adie ? ?I have reviewed the above documentation for accuracy and completeness, and I agree with the above. ?  ?

## 2021-04-16 ENCOUNTER — Other Ambulatory Visit: Payer: Self-pay | Admitting: Hematology and Oncology

## 2021-04-16 ENCOUNTER — Inpatient Hospital Stay: Payer: Medicare Other | Attending: Hematology and Oncology | Admitting: Hematology and Oncology

## 2021-04-16 ENCOUNTER — Other Ambulatory Visit: Payer: Self-pay

## 2021-04-16 VITALS — BP 141/76 | HR 70 | Temp 97.9°F | Resp 18 | Ht 73.0 in | Wt 221.0 lb

## 2021-04-16 DIAGNOSIS — I82431 Acute embolism and thrombosis of right popliteal vein: Secondary | ICD-10-CM | POA: Diagnosis not present

## 2021-04-16 DIAGNOSIS — I82441 Acute embolism and thrombosis of right tibial vein: Secondary | ICD-10-CM | POA: Insufficient documentation

## 2021-04-16 DIAGNOSIS — Z7901 Long term (current) use of anticoagulants: Secondary | ICD-10-CM | POA: Insufficient documentation

## 2021-04-16 DIAGNOSIS — M25472 Effusion, left ankle: Secondary | ICD-10-CM | POA: Diagnosis not present

## 2021-04-16 DIAGNOSIS — I82462 Acute embolism and thrombosis of left calf muscular vein: Secondary | ICD-10-CM | POA: Diagnosis not present

## 2021-04-16 DIAGNOSIS — Z79899 Other long term (current) drug therapy: Secondary | ICD-10-CM | POA: Diagnosis not present

## 2021-04-16 DIAGNOSIS — Z86718 Personal history of other venous thrombosis and embolism: Secondary | ICD-10-CM | POA: Insufficient documentation

## 2021-04-16 DIAGNOSIS — M25471 Effusion, right ankle: Secondary | ICD-10-CM | POA: Insufficient documentation

## 2021-04-16 MED ORDER — RIVAROXABAN (XARELTO) VTE STARTER PACK (15 & 20 MG): ORAL_TABLET | ORAL | 4 refills | Status: DC

## 2021-04-16 MED ORDER — RIVAROXABAN 20 MG PO TABS: 20.0000 mg | ORAL_TABLET | Freq: Every day | ORAL | 3 refills | Status: DC

## 2021-04-16 NOTE — Assessment & Plan Note (Addendum)
May 2022: DVT left leg (no anticoagulation: GI bleeding: Filter placement) ?02/26/2021: DVT right popliteal vein and right posterior tibial vein ? ?Lab review 04/09/2021: No lupus anticoagulant was detected ?Duration of anticoagulation: 6 months ?Plan to obtain ultrasound for further evaluation at 6 months before discontinuation ?Patient has an IVC filter in place and I sent a message to Dr. Anselm Pancoast to see if it could be removed. ? ?Return to clinic in 6 months after ultrasound of the legs and follow-up with me. ?

## 2021-04-16 NOTE — Assessment & Plan Note (Signed)
May 2022: DVT left leg (no anticoagulation: GI bleeding: Filter placement) ?02/26/2021: DVT right popliteal vein and right posterior tibial vein ? ?Lupus anticoagulant: Not detected ?Duration of anticoagulation: 1 year ?I discussed with him that stoppage of anticoagulation can potentially lead to recurrence of blood clots. ? ?Risk of GI bleeding ? ? ?

## 2021-04-21 ENCOUNTER — Other Ambulatory Visit: Payer: Self-pay | Admitting: *Deleted

## 2021-04-21 ENCOUNTER — Other Ambulatory Visit: Payer: Self-pay | Admitting: Diagnostic Radiology

## 2021-04-21 DIAGNOSIS — Z86718 Personal history of other venous thrombosis and embolism: Secondary | ICD-10-CM

## 2021-04-21 DIAGNOSIS — Z95828 Presence of other vascular implants and grafts: Secondary | ICD-10-CM

## 2021-04-21 NOTE — Progress Notes (Signed)
MD requesting IVC filter to be removed.  RN contacted pt and pt verbalized understanding and requesting to proceed with removal.  Orders placed.  ?

## 2021-04-29 DIAGNOSIS — E1122 Type 2 diabetes mellitus with diabetic chronic kidney disease: Secondary | ICD-10-CM | POA: Diagnosis not present

## 2021-04-29 DIAGNOSIS — M25511 Pain in right shoulder: Secondary | ICD-10-CM | POA: Diagnosis not present

## 2021-04-29 DIAGNOSIS — N183 Chronic kidney disease, stage 3 unspecified: Secondary | ICD-10-CM | POA: Diagnosis not present

## 2021-04-29 DIAGNOSIS — Z79899 Other long term (current) drug therapy: Secondary | ICD-10-CM | POA: Diagnosis not present

## 2021-04-29 DIAGNOSIS — Z8719 Personal history of other diseases of the digestive system: Secondary | ICD-10-CM | POA: Diagnosis not present

## 2021-04-29 DIAGNOSIS — E538 Deficiency of other specified B group vitamins: Secondary | ICD-10-CM | POA: Diagnosis not present

## 2021-05-26 ENCOUNTER — Ambulatory Visit (INDEPENDENT_AMBULATORY_CARE_PROVIDER_SITE_OTHER): Payer: Medicare Other | Admitting: Podiatry

## 2021-05-26 ENCOUNTER — Encounter: Payer: Self-pay | Admitting: Podiatry

## 2021-05-26 DIAGNOSIS — Q828 Other specified congenital malformations of skin: Secondary | ICD-10-CM | POA: Diagnosis not present

## 2021-05-26 DIAGNOSIS — M79675 Pain in left toe(s): Secondary | ICD-10-CM | POA: Diagnosis not present

## 2021-05-26 DIAGNOSIS — M79674 Pain in right toe(s): Secondary | ICD-10-CM | POA: Diagnosis not present

## 2021-05-26 DIAGNOSIS — E1151 Type 2 diabetes mellitus with diabetic peripheral angiopathy without gangrene: Secondary | ICD-10-CM | POA: Diagnosis not present

## 2021-05-26 DIAGNOSIS — B351 Tinea unguium: Secondary | ICD-10-CM | POA: Diagnosis not present

## 2021-05-29 DIAGNOSIS — E1122 Type 2 diabetes mellitus with diabetic chronic kidney disease: Secondary | ICD-10-CM | POA: Diagnosis not present

## 2021-05-29 DIAGNOSIS — E785 Hyperlipidemia, unspecified: Secondary | ICD-10-CM | POA: Diagnosis not present

## 2021-05-29 DIAGNOSIS — N183 Chronic kidney disease, stage 3 unspecified: Secondary | ICD-10-CM | POA: Diagnosis not present

## 2021-05-29 DIAGNOSIS — I10 Essential (primary) hypertension: Secondary | ICD-10-CM | POA: Diagnosis not present

## 2021-05-31 DIAGNOSIS — M25519 Pain in unspecified shoulder: Secondary | ICD-10-CM | POA: Insufficient documentation

## 2021-05-31 DIAGNOSIS — I82409 Acute embolism and thrombosis of unspecified deep veins of unspecified lower extremity: Secondary | ICD-10-CM | POA: Insufficient documentation

## 2021-05-31 NOTE — Progress Notes (Signed)
  Subjective:  Patient ID: Thomas Joseph, male    DOB: [birth year],  MRN: [id number]  Thomas Joseph presents to clinic today for at risk foot care. Pt has h/o NIDDM with PAD and painful porokeratotic lesion(s) left lower extremity and painful mycotic toenails that limit ambulation. Painful toenails interfere with ambulation. Aggravating factors include wearing enclosed shoe gear. Pain is relieved with periodic professional debridement. Painful porokeratotic lesions are aggravated when weightbearing with and without shoegear. Pain is relieved with periodic professional debridement.  Last known HgA1c was 7.1%. Patient did not check blood glucose today.  New problem(s): None.   PCP is Jonathon Jordan, MD , and last visit was May 25, 2021.  Allergies  Allergen Reactions   Hydrochlorothiazide Other (See Comments)    Reaction not recalled, believes it just made him urinate very frequently   Lipitor [Atorvastatin] Other (See Comments)    Muscle pain   Metoprolol     Other reaction(s): high BP, urinary symptoms   Sildenafil     Other reaction(s): TACHYCARDIA   Tizanidine Hcl Other (See Comments)   Viagra [Sildenafil Citrate] Palpitations    Review of Systems: Negative except as noted in the HPI.  Objective: No changes noted in today's physical examination.  Constitutional Patient is a pleasant 88 y.o. African American male in NAD. AAO x 3.  Vascular Capillary fill time to digits <3 seconds b/l lower extremities. Faintly palpable DP pulse(s) b/l lower extremities. Nonpalpable PT pulse(s) b/l lower extremities. Pedal hair absent. Lower extremity skin temperature gradient within normal limits. No pain with calf compression b/l. No cyanosis or clubbing noted. +1 pitting edema bilateral ankles.  Neurologic Normal speech. Protective sensation intact 5/5 intact bilaterally with 10g monofilament b/l. Vibratory sensation diminished b/l.  Dermatologic Pedal skin thin and atrophic b/l LE. No open  wounds b/l LE. No interdigital macerations noted b/l LE. Toenails 3-5 bilaterally, L hallux, and R hallux elongated, discolored, dystrophic, thickened, and crumbly with subungual debris and tenderness to dorsal palpation. Anonychia noted bilateral 2nd toes. Nailbed(s) epithelialized.  Porokeratotic lesion(s) submet head 5 left foot. No erythema, no edema, no drainage, no fluctuance.  Orthopedic: Normal muscle strength 5/5 to all lower extremity muscle groups bilaterally. Normal muscle strength 5/5 to all lower extremity muscle groups bilaterally. Palpable exostosis noted dorsal midfoot joint of left foot. HAV with bunion deformity noted b/l LE.Marland Kitchen No pain, crepitus or joint limitation noted with ROM b/l LE.  Patient ambulates independently without assistive aids.   Assessment/Plan: 1. Pain due to onychomycosis of toenails of both feet   2. Porokeratosis   3. Type II diabetes mellitus with peripheral circulatory disorder [facility])     -Patient was evaluated and treated. All patient's and/or POA's questions/concerns answered on today's visit. -Mycotic toenails 3-5 bilaterally, L hallux, and R hallux were debrided in length and girth with sterile nail nippers and dremel without iatrogenic bleeding. -Porokeratotic lesion(s) submet head 5 left foot pared and enucleated with sterile scalpel blade without incident. Total number of lesions debrided=1. -Patient/POA to call should there be question/concern in the interim.   Return in about 3 months (around 08/26/2021).  Marzetta Board, DPM

## 2021-06-24 DIAGNOSIS — H02834 Dermatochalasis of left upper eyelid: Secondary | ICD-10-CM | POA: Diagnosis not present

## 2021-06-24 DIAGNOSIS — H40053 Ocular hypertension, bilateral: Secondary | ICD-10-CM | POA: Diagnosis not present

## 2021-06-24 DIAGNOSIS — H02831 Dermatochalasis of right upper eyelid: Secondary | ICD-10-CM | POA: Diagnosis not present

## 2021-06-24 DIAGNOSIS — H11133 Conjunctival pigmentations, bilateral: Secondary | ICD-10-CM | POA: Diagnosis not present

## 2021-06-24 DIAGNOSIS — H2513 Age-related nuclear cataract, bilateral: Secondary | ICD-10-CM | POA: Diagnosis not present

## 2021-06-24 DIAGNOSIS — H11823 Conjunctivochalasis, bilateral: Secondary | ICD-10-CM | POA: Diagnosis not present

## 2021-06-24 DIAGNOSIS — H401111 Primary open-angle glaucoma, right eye, mild stage: Secondary | ICD-10-CM | POA: Diagnosis not present

## 2021-06-24 DIAGNOSIS — H31092 Other chorioretinal scars, left eye: Secondary | ICD-10-CM | POA: Diagnosis not present

## 2021-07-08 DIAGNOSIS — E119 Type 2 diabetes mellitus without complications: Secondary | ICD-10-CM | POA: Diagnosis not present

## 2021-08-13 DIAGNOSIS — M79605 Pain in left leg: Secondary | ICD-10-CM | POA: Diagnosis not present

## 2021-08-13 DIAGNOSIS — M79604 Pain in right leg: Secondary | ICD-10-CM | POA: Diagnosis not present

## 2021-08-17 ENCOUNTER — Other Ambulatory Visit: Payer: Self-pay | Admitting: *Deleted

## 2021-08-17 NOTE — Patient Outreach (Signed)
  Care Coordination   08/17/2021 Name: Thomas Joseph MRN: [id number] DOB: October 28, 1933   Care Coordination Outreach Attempts:  An unsuccessful telephone outreach was attempted today to offer the patient information about available care coordination services as a benefit of their health plan.   Follow Up Plan:  Additional outreach attempts will be made to offer the patient care coordination information and services.   Encounter Outcome:  No Answer  Care Coordination Interventions Activated:  No   Care Coordination Interventions:  No, not indicated    Elliot Cousin, RN Care Management Coordinator [employer] Main Office [phone]

## 2021-08-18 ENCOUNTER — Other Ambulatory Visit: Payer: Self-pay | Admitting: *Deleted

## 2021-08-18 NOTE — Patient Outreach (Addendum)
  Care Coordination   08/18/2021 Name: Thomas Joseph MRN: [id number] DOB: January 11, 1934   Care Coordination Outreach Attempts:  A second unsuccessful outreach was attempted today to offer the patient with information about available care coordination services as a benefit of their health plan.     Follow Up Plan:  Additional outreach attempts will be made to offer the patient care coordination information and services.  No pt called over 10 times today so RN responded with a call back that remains unsuccessful Encounter Outcome:  No Answer  Care Coordination Interventions Activated:  No   Care Coordination Interventions:  No, not indicated    Elliot Cousin, RN Care Management Coordinator [employer] Main Office [phone]

## 2021-08-25 ENCOUNTER — Ambulatory Visit (HOSPITAL_COMMUNITY): Admission: RE | Admit: 2021-08-25 | Payer: Medicare Other | Source: Ambulatory Visit

## 2021-08-25 ENCOUNTER — Other Ambulatory Visit: Payer: Self-pay | Admitting: Hematology and Oncology

## 2021-08-25 DIAGNOSIS — I82462 Acute embolism and thrombosis of left calf muscular vein: Secondary | ICD-10-CM

## 2021-08-26 ENCOUNTER — Ambulatory Visit (HOSPITAL_BASED_OUTPATIENT_CLINIC_OR_DEPARTMENT_OTHER)
Admission: RE | Admit: 2021-08-26 | Discharge: 2021-08-26 | Disposition: A | Discharge status: Home / Self Care | Payer: Medicare Other | Source: Ambulatory Visit | Attending: Hematology and Oncology | Admitting: Hematology and Oncology

## 2021-08-26 ENCOUNTER — Inpatient Hospital Stay: Payer: Medicare Other | Admitting: Hematology and Oncology

## 2021-08-26 ENCOUNTER — Other Ambulatory Visit: Payer: Self-pay

## 2021-08-26 ENCOUNTER — Inpatient Hospital Stay: Payer: Medicare Other | Attending: Hematology and Oncology

## 2021-08-26 VITALS — BP 145/67 | HR 66 | Temp 98.1°F | Resp 18 | Ht 73.0 in | Wt 217.6 lb

## 2021-08-26 DIAGNOSIS — Z79899 Other long term (current) drug therapy: Secondary | ICD-10-CM | POA: Insufficient documentation

## 2021-08-26 DIAGNOSIS — I82462 Acute embolism and thrombosis of left calf muscular vein: Secondary | ICD-10-CM

## 2021-08-26 DIAGNOSIS — E119 Type 2 diabetes mellitus without complications: Secondary | ICD-10-CM | POA: Insufficient documentation

## 2021-08-26 LAB — D-DIMER, QUANTITATIVE: D-Dimer, Quant: <0.27 ug/mL-FEU (ref 0.00–0.50)

## 2021-08-26 NOTE — Progress Notes (Signed)
Bilateral lower extremity venous duplex has been completed. Preliminary results can be found in CV Proc through chart review.  Results were given to [facility] at Dr. Earmon [place] office.  08/26/21 11:50 AM Olen Cordial RVT

## 2021-08-26 NOTE — Assessment & Plan Note (Signed)
May 2022: DVT left leg (no anticoagulation: GI bleeding: Filter placement) 02/26/2021: DVT right popliteal vein and right posterior tibial vein  Lab review 04/09/2021: No lupus anticoagulant was detected Duration of anticoagulation: 6 months  Plan to obtain ultrasound for further evaluation at 6 months before discontinuation Patient has an IVC filter in place and I sent a message to Dr. Lowella Dandy to see if it could be removed.

## 2021-08-26 NOTE — Progress Notes (Signed)
Patient Care Team: Mila Palmer, MD as PCP - General (Family Medicine)  DIAGNOSIS:  Encounter Diagnosis  Name Primary?   Acute deep vein thrombosis (DVT) of calf muscle vein of left lower extremity (HCC) Yes    CHIEF COMPLIANT: DVT lower extremities  INTERVAL HISTORY: Thomas Joseph is a 88 y.o. with the above mentioned history of diabetes in bilateral lower extremities.He presents to the clinic today for labs and follow-up. Denies any bruising or bleeding.  He has been tolerating anticoagulation extremely well.   ALLERGIES:  is allergic to hydrochlorothiazide, lipitor [atorvastatin], metoprolol, tizanidine hcl, sildenafil, and viagra [sildenafil citrate].  MEDICATIONS:  Current Outpatient Medications  Medication Sig Dispense Refill   acetaminophen (TYLENOL) 325 MG tablet Take 325-650 mg by mouth every 12 (twelve) hours as needed for mild pain or headache.     acetaminophen (TYLENOL) 325 MG tablet 1 tablet as needed     amLODipine (NORVASC) 10 MG tablet Take 10 mg by mouth daily.     amLODipine (NORVASC) 10 MG tablet 1 tablet     Ascorbic Acid (VITAMIN C) 500 MG CAPS Take 1 tablet by mouth daily.     Blood Pressure Monitor DEVI      cetirizine (ZYRTEC ALLERGY) 10 MG tablet 1 tablet as needed     cetirizine (ZYRTEC) 10 MG tablet Take 10 mg by mouth daily as needed for allergies.     Cyanocobalamin (VITAMIN B-12) 500 MCG TABS Take 500 mcg by mouth daily.     docusate sodium (COLACE) 100 MG capsule 1 capsule     Iron-FA-B Cmp-C-Biot-Probiotic (FUSION PLUS) CAPS Take 1 capsule by mouth daily.     latanoprost (XALATAN) 0.005 % ophthalmic solution Place 1 drop into both eyes at bedtime.     latanoprost (XALATAN) 0.005 % ophthalmic solution 1 drop into both eyes at bedtime     lisinopril-hydrochlorothiazide (ZESTORETIC) 20-12.5 MG tablet Take 1 tablet by mouth daily.     metFORMIN (GLUCOPHAGE-XR) 500 MG 24 hr tablet Take 2 tablets by mouth daily.     metoprolol succinate  (TOPROL-XL) 25 MG 24 hr tablet Take 25 mg by mouth daily.     polyethylene glycol (MIRALAX / GLYCOLAX) 17 g packet Take 17 g by mouth daily. 14 each 0   predniSONE (DELTASONE) 20 MG tablet Take 2 tablets (40 mg total) by mouth daily. 10 tablet 0   Psyllium (METAMUCIL) 48.57 % POWD 1 packet     Psyllium (METAMUCIL) 48.57 % POWD 1 packet     rosuvastatin (CRESTOR) 5 MG tablet Take 5 mg by mouth daily.     sulfamethoxazole-trimethoprim (BACTRIM DS) 800-160 MG tablet Take 1 tablet by mouth 2 (two) times daily.     vitamin B-12 (CYANOCOBALAMIN) 1000 MCG tablet 1 tablet     zinc sulfate 220 (50 Zn) MG capsule Take 220 mg by mouth daily.     No current facility-administered medications for this visit.    PHYSICAL EXAMINATION: ECOG PERFORMANCE STATUS: 1 - Symptomatic but completely ambulatory  Vitals:   08/26/21 1045  BP: (!) 145/67  Pulse: 66  Resp: 18  Temp: 98.1 F (36.7 C)  SpO2: 100%   Filed Weights   08/26/21 1045  Weight: 217 lb 9.6 oz (98.7 kg)     LABORATORY DATA:  I have reviewed the data as listed    Latest Ref Rng & Units 12/29/2020    4:54 PM 05/26/2020   12:40 PM 05/19/2020    3:27 AM  CMP  Glucose  70 - 99 mg/dL 481  856  314   BUN 8 - 23 mg/dL 34  17  10   Creatinine 0.61 - 1.24 mg/dL 9.70  2.63  7.85   Sodium 135 - 145 mmol/L 138  136  136   Potassium 3.5 - 5.1 mmol/L 4.2  5.6  3.6   Chloride 98 - 111 mmol/L 102  106  107   CO2 22 - 32 mmol/L 26  23  26    Calcium 8.9 - 10.3 mg/dL 9.5  8.6  7.9   Total Protein 6.5 - 8.1 g/dL  5.6    Total Bilirubin 0.3 - 1.2 mg/dL  1.4    Alkaline Phos 38 - 126 U/L  97    AST 15 - 41 U/L  46    ALT 0 - 44 U/L  14      Lab Results  Component Value Date   WBC 5.6 03/11/2021   HGB 13.1 03/11/2021   HCT 38.1 (L) 03/11/2021   MCV 93.2 03/11/2021   PLT 210 03/11/2021   NEUTROABS 3.7 03/11/2021    ASSESSMENT & PLAN:  Acute deep vein thrombosis (DVT) of calf muscle vein of left lower extremity [facility]) May 2022: DVT left leg  (no anticoagulation: GI bleeding: Filter placement) 02/26/2021: DVT right popliteal vein and right posterior tibial vein   Lab review 04/09/2021: No lupus anticoagulant was detected Duration of anticoagulation: 6 months 08/26/2021: Ultrasound bilateral lower extremities: No DVT 08/26/2021: D-dimer: Less than 0.27  Based on both the above results we decided to discontinue anticoagulation at this time. If he has another blood clot then he will need to be on lifelong anticoagulation. Return to clinic on an as-needed basis    No orders of the defined types were placed in this encounter.  The patient has a good understanding of the overall plan. he agrees with it. he will call with any problems that may develop before the next visit here. Total time spent: 30 mins including face to face time and time spent for planning, charting and co-ordination of care   08/28/2021, MD 08/26/21    I 08/28/21 am scribing for Dr. Janan Ridge  I have reviewed the above documentation for accuracy and completeness, and I agree with the above.

## 2021-09-01 ENCOUNTER — Ambulatory Visit: Payer: Medicare Other | Admitting: Podiatry

## 2021-09-23 ENCOUNTER — Ambulatory Visit (INDEPENDENT_AMBULATORY_CARE_PROVIDER_SITE_OTHER): Payer: Medicare Other | Admitting: Podiatry

## 2021-09-23 ENCOUNTER — Encounter: Payer: Self-pay | Admitting: Podiatry

## 2021-09-23 DIAGNOSIS — M79675 Pain in left toe(s): Secondary | ICD-10-CM

## 2021-09-23 DIAGNOSIS — M79674 Pain in right toe(s): Secondary | ICD-10-CM | POA: Diagnosis not present

## 2021-09-23 DIAGNOSIS — B351 Tinea unguium: Secondary | ICD-10-CM | POA: Diagnosis not present

## 2021-09-23 DIAGNOSIS — E1151 Type 2 diabetes mellitus with diabetic peripheral angiopathy without gangrene: Secondary | ICD-10-CM

## 2021-09-23 DIAGNOSIS — Q828 Other specified congenital malformations of skin: Secondary | ICD-10-CM

## 2021-09-27 NOTE — Progress Notes (Signed)
  Subjective:  Patient ID: Thomas Joseph, male    DOB: 03-17-1933,  MRN: [id number]  Thomas Joseph presents to clinic today for at risk foot care. Pt has h/o NIDDM with PAD and callus(es) left lower extremity and painful thick toenails that are difficult to trim. Painful toenails interfere with ambulation. Aggravating factors include wearing enclosed shoe gear. Pain is relieved with periodic professional debridement. Painful calluses are aggravated when weightbearing with and without shoegear. Pain is relieved with periodic professional debridement.  Patient states blood glucose was 88 mg/dl yesterday. Last known  HgA1c was 6.7%.    New problem(s): None.   PCP is Jonathon Jordan, MD , and last visit was  April 29, 2021.  Allergies  Allergen Reactions   Hydrochlorothiazide Other (See Comments)    Reaction not recalled, believes it just made him urinate very frequently   Lipitor [Atorvastatin] Other (See Comments)    Muscle pain   Metoprolol     Other reaction(s): high BP, urinary symptoms   Tizanidine Hcl Other (See Comments)   Sildenafil Palpitations    Other reaction(s): TACHYCARDIA   Viagra [Sildenafil Citrate] Palpitations    Review of Systems: Negative except as noted in the HPI.  Objective: No changes noted in today's physical examination. Thomas Joseph is a pleasant 88 y.o. male in NAD. AAO x 3.  Vascular Capillary fill time to digits <3 seconds b/l lower extremities. Faintly palpable DP pulse(s) b/l lower extremities. Nonpalpable PT pulse(s) b/l lower extremities. Pedal hair absent. Lower extremity skin temperature gradient within normal limits. No pain with calf compression b/l. No cyanosis or clubbing noted. +1 pitting edema bilateral ankles. Wearing compression hose today.  Neurologic Normal speech. Protective sensation intact 5/5 intact bilaterally with 10g monofilament b/l. Vibratory sensation diminished b/l.  Dermatologic Pedal skin thin and atrophic b/l LE. No  open wounds b/l LE. No interdigital macerations noted b/l LE. Toenails 3-5 bilaterally, L hallux, and R hallux elongated, discolored, dystrophic, thickened, and crumbly with subungual debris and tenderness to dorsal palpation. Anonychia noted bilateral 2nd toes. Nailbed(s) epithelialized.  Porokeratotic lesion(s) submet head 5 left foot. No erythema, no edema, no drainage, no fluctuance.  Orthopedic: Normal muscle strength 5/5 to all lower extremity muscle groups bilaterally. Normal muscle strength 5/5 to all lower extremity muscle groups bilaterally. Palpable exostosis noted dorsal midfoot joint of left foot. HAV with bunion deformity noted b/l LE.Marland Kitchen No pain, crepitus or joint limitation noted with ROM b/l LE.  Patient ambulates independently without assistive aids.  Assessment/Plan: 1. Pain due to onychomycosis of toenails of both feet   2. Porokeratosis   3. Type II diabetes mellitus with peripheral circulatory disorder (HCC)     -Examined patient. -No new findings. No new orders. -Toenails bilateral great toes and 3-5 bilaterally debrided in length and girth without iatrogenic bleeding with sterile nail nipper and dremel.  -Porokeratotic lesion(s) submet head 5 left foot pared and enucleated with sterile currette without incident. Total number of lesions debrided=1. -Patient/POA to call should there be question/concern in the interim.   Return in about 3 months (around 12/23/2021).  Marzetta Board, DPM

## 2021-10-14 ENCOUNTER — Telehealth: Payer: Self-pay | Admitting: *Deleted

## 2021-10-14 ENCOUNTER — Encounter: Payer: Self-pay | Admitting: *Deleted

## 2021-10-14 NOTE — Patient Outreach (Signed)
  Care Coordination   Initial Visit Note   10/14/2021 Name: LEALAND ELTING MRN: [id number] DOB: 1933/12/11  INDIANA PECHACEK is a 88 y.o. year old male who sees Jonathon Jordan, MD for primary care. I spoke with  [name] by phone today.  What matters to the patients health and wellness today?  No needs    Goals Addressed               This Visit's Progress     COMPLETED: No needs (pt-stated)        Care Coordination Interventions: Reviewed medications with patient and discussed adherence to all medication and educated accordingly Reviewed scheduled/upcoming provider appointments including pending appointments and verified pt has his AWV in Nov scheduled with his provider Screening for signs and symptoms of depression related to chronic disease state  Assessed social determinant of health barriers          SDOH assessments and interventions completed:  Yes  SDOH Interventions Today    Flowsheet Row Most Recent Value  SDOH Interventions   Food Insecurity Interventions Intervention Not Indicated  Housing Interventions Intervention Not Indicated  Transportation Interventions Intervention Not Indicated  Utilities Interventions Intervention Not Indicated        Care Coordination Interventions Activated:  Yes  Care Coordination Interventions:  Yes, provided   Follow up plan: No further intervention required.   Encounter Outcome:  Pt. Visit Completed   Raina Mina, RN Care Management Coordinator [place] Office [phone]

## 2021-10-14 NOTE — Patient Instructions (Signed)
Visit Information  Thank you for taking time to visit with me today. Please don't hesitate to contact me if I can be of assistance to you.   Following are the goals we discussed today:   Goals Addressed               This Visit's Progress     COMPLETED: No needs (pt-stated)        Care Coordination Interventions: Reviewed medications with patient and discussed adherence to all medication and educated accordingly Reviewed scheduled/upcoming provider appointments including pending appointments and verified pt has his AWV in Nov scheduled with his provider Screening for signs and symptoms of depression related to chronic disease state  Assessed social determinant of health barriers          Please call the care guide team at [phone] if you need to cancel or reschedule your appointment.   If you are experiencing a Mental Health or [place] or need someone to talk to, please call the Suicide and Crisis Lifeline: 988  The patient verbalized understanding of instructions, educational materials, and care plan provided today and DECLINED offer to receive copy of patient instructions, educational materials, and care plan.   No further follow up required: No needs    Raina Mina, RN Care Management Coordinator [place] Office [phone]

## 2021-10-16 DIAGNOSIS — Z23 Encounter for immunization: Secondary | ICD-10-CM | POA: Diagnosis not present

## 2021-11-30 DIAGNOSIS — M5441 Lumbago with sciatica, right side: Secondary | ICD-10-CM | POA: Diagnosis not present

## 2021-12-02 DIAGNOSIS — Z8719 Personal history of other diseases of the digestive system: Secondary | ICD-10-CM | POA: Diagnosis not present

## 2021-12-02 DIAGNOSIS — H401111 Primary open-angle glaucoma, right eye, mild stage: Secondary | ICD-10-CM | POA: Diagnosis not present

## 2021-12-02 DIAGNOSIS — I700 Atherosclerosis of aorta: Secondary | ICD-10-CM | POA: Diagnosis not present

## 2021-12-02 DIAGNOSIS — E441 Mild protein-calorie malnutrition: Secondary | ICD-10-CM | POA: Diagnosis not present

## 2021-12-02 DIAGNOSIS — I10 Essential (primary) hypertension: Secondary | ICD-10-CM | POA: Diagnosis not present

## 2021-12-02 DIAGNOSIS — E1122 Type 2 diabetes mellitus with diabetic chronic kidney disease: Secondary | ICD-10-CM | POA: Diagnosis not present

## 2021-12-02 DIAGNOSIS — E038 Other specified hypothyroidism: Secondary | ICD-10-CM | POA: Diagnosis not present

## 2021-12-02 DIAGNOSIS — N1831 Chronic kidney disease, stage 3a: Secondary | ICD-10-CM | POA: Diagnosis not present

## 2021-12-02 DIAGNOSIS — Z79899 Other long term (current) drug therapy: Secondary | ICD-10-CM | POA: Diagnosis not present

## 2021-12-02 DIAGNOSIS — M5136 Other intervertebral disc degeneration, lumbar region: Secondary | ICD-10-CM | POA: Diagnosis not present

## 2021-12-02 DIAGNOSIS — Z0000 Encounter for general adult medical examination without abnormal findings: Secondary | ICD-10-CM | POA: Diagnosis not present

## 2021-12-02 DIAGNOSIS — E785 Hyperlipidemia, unspecified: Secondary | ICD-10-CM | POA: Diagnosis not present

## 2021-12-02 DIAGNOSIS — M353 Polymyalgia rheumatica: Secondary | ICD-10-CM | POA: Diagnosis not present

## 2021-12-02 DIAGNOSIS — I771 Stricture of artery: Secondary | ICD-10-CM | POA: Diagnosis not present

## 2021-12-10 DIAGNOSIS — E119 Type 2 diabetes mellitus without complications: Secondary | ICD-10-CM | POA: Diagnosis not present

## 2021-12-24 ENCOUNTER — Ambulatory Visit: Payer: Medicare Other | Attending: Family Medicine | Admitting: Rehabilitative and Restorative Service Providers"

## 2021-12-24 ENCOUNTER — Other Ambulatory Visit: Payer: Self-pay

## 2021-12-24 ENCOUNTER — Encounter: Payer: Self-pay | Admitting: Rehabilitative and Restorative Service Providers"

## 2021-12-24 DIAGNOSIS — M5441 Lumbago with sciatica, right side: Secondary | ICD-10-CM | POA: Insufficient documentation

## 2021-12-24 DIAGNOSIS — M6281 Muscle weakness (generalized): Secondary | ICD-10-CM

## 2021-12-24 DIAGNOSIS — R262 Difficulty in walking, not elsewhere classified: Secondary | ICD-10-CM

## 2021-12-24 DIAGNOSIS — R2689 Other abnormalities of gait and mobility: Secondary | ICD-10-CM

## 2021-12-24 DIAGNOSIS — M5459 Other low back pain: Secondary | ICD-10-CM

## 2021-12-24 DIAGNOSIS — R269 Unspecified abnormalities of gait and mobility: Secondary | ICD-10-CM | POA: Insufficient documentation

## 2021-12-24 NOTE — Therapy (Signed)
OUTPATIENT PHYSICAL THERAPY THORACOLUMBAR EVALUATION   Patient Name: Thomas Joseph MRN: [id number] DOB:07-27-1933, 88 y.o., male Today's Date: 12/24/2021  END OF SESSION:  PT End of Session - 12/24/21 1156     Visit Number 1    Date for PT Re-Evaluation 02/19/22    Authorization Type [employer] Medicare    PT Start Time 1145    PT Stop Time 1225    PT Time Calculation (min) 40 min    Activity Tolerance Patient tolerated treatment well    Behavior During Therapy WFL for tasks assessed/performed             Past Medical History:  Diagnosis Date   Anemia    BPH (benign prostatic hyperplasia)    Chronic kidney disease (CKD) stage G3a/A1, moderately decreased glomerular filtration rate (GFR) between 45-59 mL/min/1.73 square meter and albuminuria creatinine ratio less than 30 mg/g (HCC) 03/07/2020   Diabetes mellitus without complication [facility])    ED (erectile dysfunction)    Family history of adverse reaction to anesthesia    son had some problem years ago, pt unsure of what exact problem was   Hypertension    Obesity    Pneumonia    Past Surgical History:  Procedure Laterality Date    LEFT KNEE ARTHROCOPY     COLONOSCOPY WITH PROPOFOL Left 05/07/2020   Procedure: COLONOSCOPY WITH PROPOFOL;  Surgeon: Kathi Der, MD;  Location: [facility] ENDOSCOPY;  Service: Gastroenterology;  Laterality: Left;   COLONOSCOPY WITH PROPOFOL N/A 05/11/2020   Procedure: COLONOSCOPY WITH PROPOFOL;  Surgeon: Charlott Rakes, MD;  Location: [facility]OSCOPY;  Service: Endoscopy;  Laterality: N/A;   ESOPHAGOGASTRODUODENOSCOPY N/A 05/06/2020   Procedure: ESOPHAGOGASTRODUODENOSCOPY (EGD);  Surgeon: Willis Modena, MD;  Location: [facility] ENDOSCOPY;  Service: Endoscopy;  Laterality: N/A;   HERNIA REPAIR     IR ANGIOGRAM SELECTIVE EACH ADDITIONAL VESSEL  05/11/2020   IR ANGIOGRAM VISCERAL SELECTIVE  05/11/2020   IR EMBO ART  VEN HEMORR LYMPH EXTRAV  INC GUIDE ROADMAPPING  05/11/2020   IR IVC FILTER PLMT / S&I /IMG  GUID/MOD SED  05/11/2020   IR RADIOLOGIST EVAL & MGMT  08/27/2020   IR RADIOLOGIST EVAL & MGMT  02/26/2021   IR US GUIDE VASC ACCESS RIGHT  05/11/2020   IR VENOGRAM RENAL BI  05/11/2020   TRANSURETHRAL RESECTION OF PROSTATE N/A 12/26/2014   Procedure: TRANSURETHRAL RESECTION OF THE PROSTATE;  Surgeon: Malen Gauze, MD;  Location: [facility] ORS;  Service: Urology;  Laterality: N/A;   Patient Active Problem List   Diagnosis Date Noted   Acute embolism and thrombosis of unspecified deep veins of unspecified lower extremity (HCC) 05/31/2021   Shoulder joint pain 05/31/2021   Abnormal gait 02/24/2021   Acute deep vein thrombosis (DVT) of calf muscle vein of left lower extremity (HCC) 02/24/2021   Acute pulmonary embolism (HCC) 02/24/2021   Hemorrhage of colon due to diverticulosis 02/24/2021   History of DVT (deep vein thrombosis) 02/24/2021   Malnutrition of mild degree Lily Kocher: 75% to less than 90% of standard weight) (HCC) 02/24/2021   Personal history of pulmonary embolism 02/24/2021   Polymyalgia rheumatica (HCC) 02/24/2021   Presence of other vascular implants and grafts 02/24/2021   Urinary hesitancy 02/24/2021   Vitamin B deficiency 02/24/2021   Hypomagnesemia 05/11/2020   Atrial fibrillation with RVR (HCC) 05/11/2020   GI bleed 05/05/2020   AKI (acute kidney injury) (HCC)    2019 novel coronavirus disease (COVID-19) 03/07/2020   Anemia 03/07/2020   Arthropathy 03/07/2020  Body mass index (BMI) 32.0-32.9, adult 03/07/2020   Body mass index (BMI) 34.0-34.9, adult 03/07/2020   Chronic kidney disease (CKD) stage G3a/A1, moderately decreased glomerular filtration rate (GFR) between 45-59 mL/min/1.73 square meter and albuminuria creatinine ratio less than 30 mg/g (HCC) 03/07/2020   Chronic sinusitis 03/07/2020   Constipation 03/07/2020   Diabetic renal disease (HCC) 03/07/2020   Hardening of the aorta (main artery of the heart) (HCC) 03/07/2020   Hyperlipidemia 03/07/2020   Microscopic  hematuria 03/07/2020   Nausea 03/07/2020   Other abnormalities of gait and mobility 03/07/2020   Personal history of colonic polyps 03/07/2020   Pneumonia due to SARS-associated coronavirus 03/07/2020   Primary open-angle glaucoma, right eye, mild stage 03/07/2020   Degeneration of lumbar intervertebral disc 03/07/2020   Radiculopathy due to lumbar intervertebral disc disorder 03/07/2020   Right lower quadrant pain 03/07/2020   S/P TURP (status post transurethral resection of prostate) 03/07/2020   Secondary hyperparathyroidism of renal origin (HCC) 03/07/2020   Senile purpura (HCC) 03/07/2020   Sequelae of other specified infectious and parasitic diseases 03/07/2020   Stricture of artery (HCC) 03/07/2020   Subclinical hypothyroidism 03/07/2020   History of COVID-19 04/11/2019   Shortness of breath 04/11/2019   Irregular heart rhythm 04/11/2019   Fatigue 04/11/2019   Generalized weakness 04/11/2019   Acute respiratory failure with hypoxia (HCC) 02/02/2019   Essential hypertension 02/02/2019   Type 2 diabetes mellitus with complication, without long-term current use of insulin (HCC) 02/02/2019   Hypokalemia 02/02/2019   BPH (benign prostatic hyperplasia) 12/26/2014    PCP: Mila Palmer, MD  REFERRING PROVIDER: Camie Patience, FNP  REFERRING DIAG: M54.41 Acute right sided low back pain with right-sided sciatica R26.9 Abnormal Gait  Rationale for Evaluation and Treatment: Rehabilitation  THERAPY DIAG:  Other low back pain - Plan: PT plan of care cert/re-cert  Other abnormalities of gait and mobility - Plan: PT plan of care cert/re-cert  Muscle weakness (generalized) - Plan: PT plan of care cert/re-cert  Difficulty in walking, not elsewhere classified - Plan: PT plan of care cert/re-cert  ONSET DATE: November 2023  SUBJECTIVE:                                                                                                                                                                                            SUBJECTIVE STATEMENT: Pt reports a history of back pain in the past, but he states that he was doing well.  Patient reports that in November 2023, he started having increased pain.  Patient states a history of constipation, but states that this pain is different.  Reports pain worse  when he bends over and gets up.  Pt reports that [facility], but mainly just walking currently secondary to pain.  PERTINENT HISTORY:  Hypertension, chronic kidney disease, diabetes, lower lumbar spondylosis with vacuum disc phenomenon at L5-S1 and bilateral foraminal narrowing, OA, DVT  PAIN:  Are you having pain? Yes: NPRS scale: 5/10 Pain location: low back into right groin Pain description: dull ache Aggravating factors: bending and getting up Relieving factors: lying supine  PRECAUTIONS: Fall  WEIGHT BEARING RESTRICTIONS: No  FALLS:  Has patient fallen in last 6 months? Yes. Number of falls 1 fall while walking outside around his wood pile  LIVING ENVIRONMENT: Lives with: lives with their spouse Lives in: House/apartment Stairs: Yes: External: 3 steps; bilateral but cannot reach both Has following equipment at home: Single point cane, Environmental consultantWalker - 2 wheeled, Wheelchair (manual), and Grab bars  OCCUPATION: retired  PLOF: Independent and Leisure: watching football, gardening, [facility] [employer]  PATIENT GOALS: To be able to walk better and have better balance.  NEXT MD VISIT: as needed, apart from yearly scheduled in October 2024  OBJECTIVE:   DIAGNOSTIC FINDINGS:  Cervical CT on 12/29/2020: 1. No acute displaced fracture or traumatic listhesis of the cervical spine. 2. Multilevel degenerative changes of the spine leading to severe C3-C4 and C4-C5 left osseous neural foraminal stenosis.  PATIENT SURVEYS:  12/24/2021:  FOTO 45% (Projected 63% by visit 13  SCREENING FOR RED FLAGS: Bowel or bladder incontinence: No Spinal tumors:  No Cauda equina syndrome: No Compression fracture: No Abdominal aneurysm: No  COGNITION: Overall cognitive status: Within functional limits for tasks assessed     SENSATION: WFL  MUSCLE LENGTH: Hamstrings: Tightness bilaterally  POSTURE: rounded shoulders and forward head  PALPATION: Some tight lumbar paraspinals, but no tenderness  LUMBAR ROM:   Limited 25-50% with minimal pain with lumbar flexion  LOWER EXTREMITY ROM:     WFL  LOWER EXTREMITY MMT:    12/24/2021: Right hamstring strength of 4/5, Left is WFL Right quad strength of 4/5, left is WFL Right hip strength of 4-/5, Left hip strength of 4/5  LUMBAR SPECIAL TESTS:  Slump test: Positive  FUNCTIONAL TESTS:   12/24/2021: 5 times sit to stand: 22.3 sec with hands pushing up from chair rails Timed Up and Go (TUG):  23.1 sec  GAIT: Distance walked: 50 Assistive device utilized: None Level of assistance: SBA Comments: Slow gait velocity with shuffling gait pattern  TODAY'S TREATMENT:                                                                                                                              DATE: 12/24/2021  Seated:  marching, LAQ.  BLE x10 Sit to/from stand x5  Seated hamstring stretch x20 sec bilat   PATIENT EDUCATION:  Education details: Issued HEP Person educated: Patient Education method: Explanation, Demonstration, and Handouts Education comprehension: verbalized understanding and returned demonstration  HOME EXERCISE PROGRAM:  Access Code: [email or web address] URL: [email or web address] Date: 12/24/2021 Prepared by: Clydie Braun Ghazal Pevey  Exercises - Seated Hamstring Stretch  - 1 x daily - 7 x weekly - 1 sets - 2 reps - 20 sec hold - Seated Long Arc Quad  - 1 x daily - 7 x weekly - 2 sets - 10 reps - Seated March  - 1 x daily - 7 x weekly - 2 sets - 10 reps - Sit to Stand  - 1 x daily - 7 x weekly - 2 sets - 5 reps  ASSESSMENT:  CLINICAL IMPRESSION: Patient is a 88 y.o.  male who was seen today for physical therapy evaluation and treatment for acute right sided-sided lwo back pain with right sided sciatica and abnormal gait. Patient's PLOF is ambulating without device and able to garden and exercise at the [facility].  Patient states that he has been limited in his exercise recently at the [facility] to primarily walking, and he walks about a mile when he goes.  Patient presents with forward flexed standing posture, increased pain pain radiating into right groin, difficulty walking with shuffling gait, and pt reports dyspnea with increased ambulation.  Patient states that his pain is primarily when he is bending down to perform activities such as loading wood into his wood stove and stands back up.  Patient would benefit from skilled PT to address his functional impairments and allow him to decrease his risk of falling and decreased pain with functional tasks.  OBJECTIVE IMPAIRMENTS: decreased activity tolerance, decreased balance, difficulty walking, decreased strength, increased muscle spasms, impaired flexibility, postural dysfunction, and pain.   ACTIVITY LIMITATIONS: lifting, bending, standing, and squatting  PARTICIPATION LIMITATIONS: cleaning, community activity, and yard work  PERSONAL FACTORS: Age, Past/current experiences, and 3+ comorbidities: OA, HTN, weakness since COVID in 2021, DM  are also affecting patient's functional outcome.   REHAB POTENTIAL: Good  CLINICAL DECISION MAKING: Evolving/moderate complexity  EVALUATION COMPLEXITY: Moderate   GOALS: Goals reviewed with patient? Yes  SHORT TERM GOALS: Target date: 12/14/2021  Pt to be independent with initial HEP. Baseline: Goal status: INITIAL  2.  Pt able to complete a 3 minute walk test. Baseline:  Goal status: INITIAL   LONG TERM GOALS: Target date: 02/19/2022  Pt will be independent with advanced HEP. Baseline:  Goal status: INITIAL  2.  Patient to improve time with TUG to 15 seconds or less  to decrease his risk of falling. Baseline: 23.1 sec Goal status: INITIAL  3.  Pt will increase BLE strength to at least 4+/5 to allow him to perform functional tasks with increased ease. Baseline:  Goal status: INITIAL  4.  Patient to report ability to pick up wood from the pile and place in his wood stove without increased pain. Baseline:  Goal status: INITIAL  5.  Patient to deny any falls or losses of balance within the last week of treatment with noted improved foot clearance during PT session. Baseline:  Goal status: INITIAL  PLAN:  PT FREQUENCY: 2x/week  PT DURATION: 8 weeks  PLANNED INTERVENTIONS: Therapeutic exercises, Therapeutic activity, Neuromuscular re-education, Balance training, Gait training, Patient/Family education, Self Care, Joint mobilization, Joint manipulation, Stair training, Aquatic Therapy, Dry Needling, Electrical stimulation, Spinal manipulation, Spinal mobilization, Cryotherapy, Moist heat, Taping, Traction, Ultrasound, Ionotophoresis 4mg /ml Dexamethasone, Manual therapy, and Re-evaluation.  PLAN FOR NEXT SESSION: assess and progress HEP as indicated, strengthening, balance   , PT 12/24/2021, 12:52 PM   12/26/2021 [facility] [address] [place], [place]  [place] Phone # [phone] Fax [phone]

## 2021-12-25 ENCOUNTER — Encounter: Payer: Self-pay | Admitting: Podiatry

## 2021-12-25 ENCOUNTER — Ambulatory Visit (INDEPENDENT_AMBULATORY_CARE_PROVIDER_SITE_OTHER): Payer: Medicare Other | Admitting: Podiatry

## 2021-12-25 VITALS — BP 175/81

## 2021-12-25 DIAGNOSIS — M2011 Hallux valgus (acquired), right foot: Secondary | ICD-10-CM

## 2021-12-25 DIAGNOSIS — Q828 Other specified congenital malformations of skin: Secondary | ICD-10-CM | POA: Diagnosis not present

## 2021-12-25 DIAGNOSIS — E1151 Type 2 diabetes mellitus with diabetic peripheral angiopathy without gangrene: Secondary | ICD-10-CM

## 2021-12-25 DIAGNOSIS — M2012 Hallux valgus (acquired), left foot: Secondary | ICD-10-CM

## 2021-12-25 DIAGNOSIS — E119 Type 2 diabetes mellitus without complications: Secondary | ICD-10-CM | POA: Diagnosis not present

## 2021-12-25 DIAGNOSIS — M79674 Pain in right toe(s): Secondary | ICD-10-CM

## 2021-12-28 NOTE — Progress Notes (Signed)
ANNUAL DIABETIC FOOT EXAM  Subjective: Thomas Joseph presents today for annual diabetic foot examination.  Chief Complaint  Patient presents with   Nail Problem    [facility] [id number] A1C-6.6 PCP-Wolter, Ivin Booty PCP VST-12/03/21   Patient confirms h/o diabetes.  Patient relates 20 year h/o diabetes.  Patient denies any h/o foot wounds.  Patient admits symptoms of foot numbness.  Risk factors: diabetes, h/o DVT, h/0 PE, HTN, CKD, hyperlipidemia, diabetic renal disease, h/o tobacco use in remission.  Jonathon Jordan, MD is patient's PCP.   Past Medical History:  Diagnosis Date   Anemia    BPH (benign prostatic hyperplasia)    Chronic kidney disease (CKD) stage G3a/A1, moderately decreased glomerular filtration rate (GFR) between 45-59 mL/min/1.73 square meter and albuminuria creatinine ratio less than 30 mg/g ([place]) 03/07/2020   Diabetes mellitus without complication [facility])    ED (erectile dysfunction)    Family history of adverse reaction to anesthesia    son had some problem years ago, pt unsure of what exact problem was   Hypertension    Obesity    Pneumonia    Patient Active Problem List   Diagnosis Date Noted   Acute embolism and thrombosis of unspecified deep veins of unspecified lower extremity ([place]) 05/31/2021   Shoulder joint pain 05/31/2021   Abnormal gait 02/24/2021   Acute deep vein thrombosis (DVT) of calf muscle vein of left lower extremity ([place]) 02/24/2021   Acute pulmonary embolism ([place]) 02/24/2021   Hemorrhage of colon due to diverticulosis 02/24/2021   History of DVT (deep vein thrombosis) 02/24/2021   Malnutrition of mild degree [place]ame[employer] [place]iler: 75% to less than 90% of standard weight) ([place]) 02/24/2021   Personal history of pulmonary embolism 02/24/2021   Polymyalgia rheumatica ([place]) 02/24/2021   Presence of other vascular implants and grafts 02/24/2021   Urinary hesitancy 02/24/2021   Vitamin B deficiency 02/24/2021   Hypomagnesemia 05/11/2020   Atrial  fibrillation with RVR ([place]) 05/11/2020   GI bleed 05/05/2020   AKI (acute kidney injury) ([place])    2019 novel coronavirus disease (COVID-19) 03/07/2020   Anemia 03/07/2020   Arthropathy 03/07/2020   Body mass index (BMI) 32.0-32.9, adult 03/07/2020   Body mass index (BMI) 34.0-34.9, adult 03/07/2020   Chronic kidney disease (CKD) stage G3a/A1, moderately decreased glomerular filtration rate (GFR) between 45-59 mL/min/1.73 square meter and albuminuria creatinine ratio less than 30 mg/g (HCC) 03/07/2020   Chronic sinusitis 03/07/2020   Constipation 03/07/2020   Diabetic renal disease ([place]) 03/07/2020   Hardening of the aorta (main artery of the heart) ([place]) 03/07/2020   Hyperlipidemia 03/07/2020   Microscopic hematuria 03/07/2020   Nausea 03/07/2020   Other abnormalities of gait and mobility 03/07/2020   Personal history of colonic polyps 03/07/2020   Pneumonia due to SARS-associated coronavirus 03/07/2020   Primary open-angle glaucoma, right eye, mild stage 03/07/2020   Degeneration of lumbar intervertebral disc 03/07/2020   Radiculopathy due to lumbar intervertebral disc disorder 03/07/2020   Right lower quadrant pain 03/07/2020   S/P TURP (status post transurethral resection of prostate) 03/07/2020   Secondary hyperparathyroidism of renal origin ([place]) 03/07/2020   Senile purpura ([place]) 03/07/2020   Sequelae of other specified infectious and parasitic diseases 03/07/2020   Stricture of artery ([place]) 03/07/2020   Subclinical hypothyroidism 03/07/2020   History of COVID-19 04/11/2019   Shortness of breath 04/11/2019   Irregular heart rhythm 04/11/2019   Fatigue 04/11/2019   Generalized weakness 04/11/2019   Acute respiratory failure with hypoxia ([place]) 02/02/2019   Essential  hypertension 02/02/2019   Type 2 diabetes mellitus with complication, without long-term current use of insulin ([place]) 02/02/2019   Hypokalemia 02/02/2019   BPH (benign prostatic hyperplasia) 12/26/2014   Past  Surgical History:  Procedure Laterality Date    LEFT KNEE ARTHROCOPY     COLONOSCOPY WITH PROPOFOL Left 05/07/2020   Procedure: COLONOSCOPY WITH PROPOFOL;  Surgeon: Otis Brace, MD;  Location: [place];  Service: Gastroenterology;  Laterality: Left;   COLONOSCOPY WITH PROPOFOL N/A 05/11/2020   Procedure: COLONOSCOPY WITH PROPOFOL;  Surgeon: Wilford Corner, MD;  Location: [place];  Service: Endoscopy;  Laterality: N/A;   ESOPHAGOGASTRODUODENOSCOPY N/A 05/06/2020   Procedure: ESOPHAGOGASTRODUODENOSCOPY (EGD);  Surgeon: Arta Silence, MD;  Location: [facility] END[facility]COPY;  Service: Endoscopy;  Laterality: N/A;   HERNIA REPAIR     IR ANGIOGRAM SELECTIVE EACH ADDITIONAL VESSEL  05/11/2020   IR ANGIOGRAM VISCERAL SELECTIVE  05/11/2020   IR EMBO ART  VEN HEMORR LYMPH EXTRAV  INC GUIDE ROADMAPPING  05/11/2020   IR IVC FILTER PLMT / S&I /IMG GUID/MOD SED  05/11/2020   IR RADIOLOGIST EVAL & MGMT  08/27/2020   IR RADIOLOGIST EVAL & MGMT  02/26/2021   IR US GUIDE VASC ACCESS RIGHT  05/11/2020   IR VENOGRAM RENAL BI  05/11/2020   TRANSURETHRAL RESECTION OF PROSTATE N/A 12/26/2014   Procedure: TRANSURETHRAL RESECTION OF THE PROSTATE;  Surgeon: Cleon Gustin, MD;  Location: [facility];  Service: Urology;  Laterality: N/A;   Current Outpatient Medications on File Prior to Visit  Medication Sig Dispense Refill   acetaminophen (TYLENOL) 325 MG tablet Take 325-650 mg by mouth every 12 (twelve) hours as needed for mild pain or headache.     acetaminophen (TYLENOL) 325 MG tablet 1 tablet as needed     amLODipine (NORVASC) 10 MG tablet Take 10 mg by mouth daily.     amLODipine (NORVASC) 10 MG tablet 1 tablet     Ascorbic Acid (VITAMIN C) 500 MG CAPS Take 1 tablet by mouth daily. (Patient not taking: Reported on 12/24/2021)     Blood Pressure Monitor DEVI      cetirizine (ZYRTEC ALLERGY) 10 MG tablet 1 tablet as needed     cetirizine (ZYRTEC) 10 MG tablet Take 10 mg by mouth daily as needed for allergies.      Cyanocobalamin (VITAMIN B-12) 500 MCG TABS Take 500 mcg by mouth daily.     docusate sodium (COLACE) 100 MG capsule 1 capsule     Iron-FA-B Cmp-C-Biot-Probiotic (FUSION PLUS) CAPS Take 1 capsule by mouth daily. (Patient not taking: Reported on 12/24/2021)     latanoprost (XALATAN) 0.005 % ophthalmic solution Place 1 drop into both eyes at bedtime.     latanoprost (XALATAN) 0.005 % ophthalmic solution 1 drop into both eyes at bedtime     lisinopril-hydrochlorothiazide (ZESTORETIC) 20-12.5 MG tablet Take 1 tablet by mouth daily.     metFORMIN (GLUCOPHAGE-XR) 500 MG 24 hr tablet Take 2 tablets by mouth daily.     metoprolol succinate (TOPROL-XL) 25 MG 24 hr tablet Take 25 mg by mouth daily.     polyethylene glycol (MIRALAX / GLYCOLAX) 17 g packet Take 17 g by mouth daily. (Patient not taking: Reported on 12/24/2021) 14 each 0   predniSONE (DELTASONE) 20 MG tablet Take 2 tablets (40 mg total) by mouth daily. (Patient taking differently: Take 10 mg by mouth daily.) 10 tablet 0   Psyllium (METAMUCIL) 48.57 % POWD 1 packet     Psyllium (METAMUCIL) 48.57 % POWD 1  packet     rosuvastatin (CRESTOR) 5 MG tablet Take 5 mg by mouth daily.     sulfamethoxazole-trimethoprim (BACTRIM DS) 800-160 MG tablet Take 1 tablet by mouth 2 (two) times daily. (Patient not taking: Reported on 12/24/2021)     vitamin B-12 (CYANOCOBALAMIN) 1000 MCG tablet 1 tablet     zinc sulfate 220 (50 Zn) MG capsule Take 220 mg by mouth daily. (Patient not taking: Reported on 12/24/2021)     [DISCONTINUED] benazepril (LOTENSIN) 40 MG tablet Take 40 mg by mouth daily.     [DISCONTINUED] zolpidem (AMBIEN) 10 MG tablet Take 10 mg by mouth at bedtime as needed for sleep.      No current facility-administered medications on file prior to visit.    Allergies  Allergen Reactions   Hydrochlorothiazide Other (See Comments)    Reaction not recalled, believes it just made him urinate very frequently   Lipitor [Atorvastatin] Other (See Comments)     Muscle pain   Metoprolol     Other reaction(s): high BP, urinary symptoms   Tizanidine Hcl Other (See Comments)   Sildenafil Palpitations    Other reaction(s): TACHYCARDIA   Viagra [Sildenafil Citrate] Palpitations   Social History   Occupational History   Not on file  Tobacco Use   Smoking status: Former    Types: Cigarettes    Quit date: 11/28/1984    Years since quitting: 37.1   Smokeless tobacco: Never  Substance and Sexual Activity   Alcohol use: Not Currently    Comment: occasional glass of wine   Drug use: No   Sexual activity: Not on file   Family History  Problem Relation Age of Onset   CVA Father    Hypertension Father    Leukemia Sister    Thyroid disease Sister    Cancer Brother    Immunization History  Administered Date(s) Administered   Influenza Split 10/26/2007, 10/22/2008, 10/17/2009, 10/12/2010, 10/22/2010, 11/09/2011, 10/20/2012   Influenza,inj,Quad PF,6+ Mos 09/23/2015, 10/28/2016, 09/27/2017, 09/28/2018, 10/18/2019   Influenza,inj,quad, With Preservative 10/12/2013, 10/28/2014   PFIZER(Purple Top)SARS-COV-2 Vaccination 01/24/2019, 04/18/2019, 11/10/2019   Pneumococcal Conjugate-13 01/08/2014   Pneumococcal Polysaccharide-23 10/04/2007   Td 08/12/2003   Tdap 07/11/2012   Zoster, Live 11/05/2009    Review of Systems: Negative except as noted in the HPI.   Objective: Vitals:   12/25/21 0958  BP: (!) 175/81   KENDARIOUS GUDINO is a pleasant 88 y.o. male in NAD. AAO X 3.  Vascular Examination: CFT <3 seconds b/l. DP pulses faintly palpable b/l. PT pulses nonpalpable b/l. Digital hair absent. Skin temperature gradient warm to warm b/l. No pain with calf compression. No ischemia or gangrene. No cyanosis or clubbing noted b/l. +1 pitting edema bilateral ankles.   Neurological Examination: Pt has subjective symptoms of neuropathy. Protective sensation intact 5/5 intact bilaterally with 10g monofilament b/l. Vibratory sensation diminished  b/l.  Dermatological Examination: Pedal skin thin, shiny and atrophic b/l LE. No open wounds b/l LE. No interdigital macerations noted b/l LE. Toenails bilateral great toes and 3-5 bilaterally elongated, discolored, dystrophic, thickened, and crumbly with subungual debris and tenderness to dorsal palpation. Anonychia noted bilateral 2nd toes. Nailbed(s) epithelialized.  Porokeratotic lesion(s) submet head 5 left foot. No erythema, no edema, no drainage, no fluctuance.  Musculoskeletal Examination: Muscle strength 5/5 to b/l LE. HAV with bunion deformity noted b/l LE.  Radiographs: None  Footwear Assessment: Does the patient wear appropriate shoes? Yes. Does the patient need inserts/orthotics? Yes.  ADA Risk Categorization: High Risk  Patient has one or more of the following: Loss of protective sensation Absent pedal pulses Severe Foot deformity History of foot ulcer  Assessment: 1. Pain due to onychomycosis of toenails of both feet   2. Porokeratosis   3. Hallux valgus, acquired, bilateral   4. Type II diabetes mellitus with peripheral circulatory disorder (HCC)   5. Encounter for diabetic foot exam ([place])     Plan: No orders of the defined types were placed in this encounter.  -Patient was evaluated and treated. All patient's and/or POA's questions/concerns answered on today's visit. -Diabetic foot examination performed today. -Continue diabetic foot care principles: inspect feet daily, monitor glucose as recommended by PCP and/or Endocrinologist, and follow prescribed diet per PCP, Endocrinologist and/or dietician. -Patient to continue soft, supportive shoe gear daily. -Mycotic toenails bilateral great toes and 3-5 bilaterally were debrided in length and girth with sterile nail nippers and dremel without iatrogenic bleeding. -Patient/POA to call should there be question/concern in the interim. Return in about 3 months (around 03/26/2022).  Marzetta Board, DPM

## 2021-12-29 ENCOUNTER — Encounter: Payer: Self-pay | Admitting: Rehabilitative and Restorative Service Providers"

## 2021-12-29 ENCOUNTER — Ambulatory Visit: Payer: Medicare Other | Admitting: Rehabilitative and Restorative Service Providers"

## 2021-12-29 DIAGNOSIS — R269 Unspecified abnormalities of gait and mobility: Secondary | ICD-10-CM | POA: Diagnosis not present

## 2021-12-29 DIAGNOSIS — R262 Difficulty in walking, not elsewhere classified: Secondary | ICD-10-CM

## 2021-12-29 DIAGNOSIS — M5459 Other low back pain: Secondary | ICD-10-CM

## 2021-12-29 DIAGNOSIS — R2689 Other abnormalities of gait and mobility: Secondary | ICD-10-CM

## 2021-12-29 DIAGNOSIS — M5441 Lumbago with sciatica, right side: Secondary | ICD-10-CM | POA: Diagnosis not present

## 2021-12-29 DIAGNOSIS — M6281 Muscle weakness (generalized): Secondary | ICD-10-CM

## 2021-12-29 NOTE — Therapy (Signed)
OUTPATIENT PHYSICAL THERAPY THORACOLUMBAR EVALUATION   Patient Name: Thomas Joseph MRN: [id number] DOB:Jun 26, 1933, 88 y.o., male Today's Date: 12/29/2021  END OF SESSION:  PT End of Session - 12/29/21 1439     Visit Number 2    Date for PT Re-Evaluation 02/19/22    Authorization Type [employer] Medicare    PT Start Time 6759    PT Stop Time 1515    PT Time Calculation (min) 38 min    Activity Tolerance Patient tolerated treatment well    Behavior During Therapy WFL for tasks assessed/performed             Past Medical History:  Diagnosis Date   Anemia    BPH (benign prostatic hyperplasia)    Chronic kidney disease (CKD) stage G3a/A1, moderately decreased glomerular filtration rate (GFR) between 45-59 mL/min/1.73 square meter and albuminuria creatinine ratio less than 30 mg/g ([place]) 03/07/2020   Diabetes mellitus without complication [facility])    ED (erectile dysfunction)    Family history of adverse reaction to anesthesia    son had some problem years ago, pt unsure of what exact problem was   Hypertension    Obesity    Pneumonia    Past Surgical History:  Procedure Laterality Date    LEFT KNEE ARTHROCOPY     COLONOSCOPY WITH PROPOFOL Left 05/07/2020   Procedure: COLONOSCOPY WITH PROPOFOL;  Surgeon: Otis Brace, MD;  Location: [place] [facility];  Service: Gastroenterology;  Laterality: Left;   COLONOSCOPY WITH PROPOFOL N/A 05/11/2020   Procedure: COLONOSCOPY WITH PROPOFOL;  Surgeon: Wilford Corner, MD;  Location: [place];  Service: Endoscopy;  Laterality: N/A;   ESOPHAGOGASTRODUODENOSCOPY N/A 05/06/2020   Procedure: ESOPHAGOGASTRODUODENOSCOPY (EGD);  Surgeon: Arta Silence, MD;  Location: [facility];  Service: Endoscopy;  Laterality: N/A;   HERNIA REPAIR     IR ANGIOGRAM SELECTIVE EACH ADDITIONAL VESSEL  05/11/2020   IR ANGIOGRAM VISCERAL SELECTIVE  05/11/2020   IR EMBO ART  VEN HEMORR LYMPH EXTRAV  INC GUIDE ROADMAPPING  05/11/2020   IR IVC FILTER PLMT / S&I /IMG  GUID/MOD SED  05/11/2020   IR RADIOLOGIST EVAL & MGMT  08/27/2020   IR RADIOLOGIST EVAL & MGMT  02/26/2021   IR US GUIDE VASC ACCESS RIGHT  05/11/2020   IR VENOGRAM RENAL BI  05/11/2020   TRANSURETHRAL RESECTION OF PROSTATE N/A 12/26/2014   Procedure: TRANSURETHRAL RESECTION OF THE PROSTATE;  Surgeon: Cleon Gustin, MD;  Location: [facility];  Service: Urology;  Laterality: N/A;   Patient Active Problem List   Diagnosis Date Noted   Acute embolism and thrombosis of unspecified deep veins of unspecified lower extremity ([place]) 05/31/2021   Shoulder joint pain 05/31/2021   Abnormal gait 02/24/2021   Acute deep vein thrombosis (DVT) of calf muscle vein of left lower extremity ([place]) 02/24/2021   Acute pulmonary embolism ([place]) 02/24/2021   Hemorrhage of colon due to diverticulosis 02/24/2021   History of DVT (deep vein thrombosis) 02/24/2021   Malnutrition of mild degree [place]: 75% to less than 90% of standard weight) ([place]) 02/24/2021   Personal history of pulmonary embolism 02/24/2021   Polymyalgia rheumatica ([place]) 02/24/2021   Presence of other vascular implants and grafts 02/24/2021   Urinary hesitancy 02/24/2021   Vitamin B deficiency 02/24/2021   Hypomagnesemia 05/11/2020   Atrial fibrillation with RVR ([place]) 05/11/2020   GI bleed 05/05/2020   AKI (acute kidney injury) ([place])    2019 novel coronavirus disease (COVID-19) 03/07/2020   Anemia 03/07/2020   Arthropathy 03/07/2020  Body mass index (BMI) 32.0-32.9, adult 03/07/2020   Body mass index (BMI) 34.0-34.9, adult 03/07/2020   Chronic kidney disease (CKD) stage G3a/A1, moderately decreased glomerular filtration rate (GFR) between 45-59 mL/min/1.73 square meter and albuminuria creatinine ratio less than 30 mg/g ([facility]) 03/07/2020   Chronic sinusitis 03/07/2020   Constipation 03/07/2020   Diabetic renal disease ([place]) 03/07/2020   Hardening of the aorta (main artery of the heart) ([place]) 03/07/2020   Hyperlipidemia 03/07/2020   Microscopic  hematuria 03/07/2020   Nausea 03/07/2020   Other abnormalities of gait and mobility 03/07/2020   Personal history of colonic polyps 03/07/2020   Pneumonia due to SARS-associated coronavirus 03/07/2020   Primary open-angle glaucoma, right eye, mild stage 03/07/2020   Degeneration of lumbar intervertebral disc 03/07/2020   Radiculopathy due to lumbar intervertebral disc disorder 03/07/2020   Right lower quadrant pain 03/07/2020   S/P TURP (status post transurethral resection of prostate) 03/07/2020   Secondary hyperparathyroidism of renal origin ([place]) 03/07/2020   Senile purpura ([place]) 03/07/2020   Sequelae of other specified infectious and parasitic diseases 03/07/2020   Stricture of artery ([place]) 03/07/2020   Subclinical hypothyroidism 03/07/2020   History of COVID-19 04/11/2019   Shortness of breath 04/11/2019   Irregular heart rhythm 04/11/2019   Fatigue 04/11/2019   Generalized weakness 04/11/2019   Acute respiratory failure with hypoxia ([place]) 02/02/2019   Essential hypertension 02/02/2019   Type 2 diabetes mellitus with complication, without long-term current use of insulin ([place]) 02/02/2019   Hypokalemia 02/02/2019   BPH (benign prostatic hyperplasia) 12/26/2014    PCP: Jonathon Jordan, MD  REFERRING PROVIDER: Saintclair Halsted, FNP  REFERRING DIAG: M54.41 Acute right sided low back pain with right-sided sciatica R26.9 Abnormal Gait  Rationale for Evaluation and Treatment: Rehabilitation  THERAPY DIAG:  Other low back pain  Other abnormalities of gait and mobility  Muscle weakness (generalized)  Difficulty in walking, not elsewhere classified  ONSET DATE: November 2023  SUBJECTIVE:                                                                                                                                                                                           SUBJECTIVE STATEMENT: Pt denies current pain, states that his HEP has been going well.  PERTINENT  HISTORY:  Hypertension, chronic kidney disease, diabetes, lower lumbar spondylosis with vacuum disc phenomenon at L5-S1 and bilateral foraminal narrowing, OA, DVT  PAIN:  Are you having pain? Yes: NPRS scale: 0/10 Pain location: low back into right groin Pain description: dull ache Aggravating factors: bending and getting up Relieving factors: lying supine  PRECAUTIONS: Fall  WEIGHT BEARING RESTRICTIONS: No  FALLS:  Has patient fallen in last 6 months? Yes. Number of falls 1 fall while walking outside around his wood pile  LIVING ENVIRONMENT: Lives with: lives with their spouse Lives in: House/apartment Stairs: Yes: External: 3 steps; bilateral but cannot reach both Has following equipment at home: Single point cane, Environmental consultant - 2 wheeled, Wheelchair (manual), and Grab bars  OCCUPATION: retired  PLOF: Independent and Leisure: watching football, gardening, going to [place]: To be able to walk better and have better balance.  NEXT MD VISIT: as needed, apart from yearly scheduled in October 2024  OBJECTIVE:   DIAGNOSTIC FINDINGS:  Cervical CT on 12/29/2020: 1. No acute displaced fracture or traumatic listhesis of the cervical spine. 2. Multilevel degenerative changes of the spine leading to severe C3-C4 and C4-C5 left osseous neural foraminal stenosis.  PATIENT SURVEYS:  12/24/2021:  FOTO 45% (Projected 63% by visit 13  SCREENING FOR RED FLAGS: Bowel or bladder incontinence: No Spinal tumors: No Cauda equina syndrome: No Compression fracture: No Abdominal aneurysm: No  COGNITION: Overall cognitive status: Within functional limits for tasks assessed     SENSATION: WFL  MUSCLE LENGTH: Hamstrings: Tightness bilaterally  POSTURE: rounded shoulders and forward head  PALPATION: Some tight lumbar paraspinals, but no tenderness  LUMBAR ROM:   Limited 25-50% with minimal pain with lumbar flexion  LOWER EXTREMITY ROM:     WFL  LOWER EXTREMITY MMT:     12/24/2021: Right hamstring strength of 4/5, Left is WFL Right quad strength of 4/5, left is WFL Right hip strength of 4-/5, Left hip strength of 4/5  LUMBAR SPECIAL TESTS:  Slump test: Positive  FUNCTIONAL TESTS:   12/24/2021: 5 times sit to stand: 22.3 sec with hands pushing up from chair rails Timed Up and Go (TUG):  23.1 sec  12/29/2021: 3 Minute Walk Test:  308 ft without assistive device (denies pain)  GAIT: Distance walked: 50 Assistive device utilized: None Level of assistance: SBA Comments: Slow gait velocity with shuffling gait pattern  TODAY'S TREATMENT:                                                                                                                               DATE: 12/29/2021  Nustep level 4 x6 min with PT present to discuss status Seated with 2#:  heel/toe raises, marching, LAQ.  2x10 bilat 3 min ambulation around PT gyms:  308 ft Hip adduction ball squeeze 2x10 Hip abduction with red tband 2x10 Seated shoulder ER and horizontal abduction with red tband 2x10 each Sit to stand x10 reps Seated hamstring curls with red tband 2x10 bilat   DATE: 12/24/2021  Seated:  marching, LAQ.  BLE x10 Sit to/from stand x5  Seated hamstring stretch x20 sec bilat   PATIENT EDUCATION:  Education details: Issued HEP Person educated: Patient Education method: Explanation, Demonstration, and Handouts Education comprehension: verbalized understanding and returned demonstration  HOME EXERCISE PROGRAM: Access Code: [email or web address] URL: [email or web address]  Date: 12/29/2021 Prepared by: Juel Burrow  Exercises - Seated Hamstring Stretch  - 1 x daily - 7 x weekly - 1 sets - 2 reps - 20 sec hold - Seated Long Arc Quad  - 1 x daily - 7 x weekly - 2 sets - 10 reps - Seated March  - 1 x daily - 7 x weekly - 2 sets - 10 reps - Sit to Stand  - 1 x daily - 7 x weekly - 2 sets - 5 reps - Seated Hip Abduction with Resistance  - 1 x daily - 7 x weekly  - 2 sets - 10 reps - Shoulder External Rotation and Scapular Retraction with Resistance  - 1 x daily - 7 x weekly - 2 sets - 10 reps - Standing Shoulder Horizontal Abduction with Resistance  - 1 x daily - 7 x weekly - 2 sets - 10 reps  ASSESSMENT:  CLINICAL IMPRESSION: Thomas Joseph presents to skilled PT denying pain.  Patient was able to progress with strengthening during session and perform a base line 3 minute ambulation test.  Patient was provided with updated HEP during session and a red tband to take home for use.  Patient requires cuing during session for improved upright posture.  During ambulation, pt with shuffling gait pattern noted with decreased foot clearance.  Patient continues to require skilled PT to progress towards goal releated activities.  OBJECTIVE IMPAIRMENTS: decreased activity tolerance, decreased balance, difficulty walking, decreased strength, increased muscle spasms, impaired flexibility, postural dysfunction, and pain.   ACTIVITY LIMITATIONS: lifting, bending, standing, and squatting  PARTICIPATION LIMITATIONS: cleaning, community activity, and yard work  PERSONAL FACTORS: Age, Past/current experiences, and 3+ comorbidities: OA, HTN, weakness since COVID in 2021, DM  are also affecting patient's functional outcome.   REHAB POTENTIAL: Good  CLINICAL DECISION MAKING: Evolving/moderate complexity  EVALUATION COMPLEXITY: Moderate   GOALS: Goals reviewed with patient? Yes  SHORT TERM GOALS: Target date: 12/14/2021  Pt to be independent with initial HEP. Baseline: Goal status: IN PROGRESS  2.  Pt able to complete a 3 minute walk test. Baseline:  Goal status: MET on 12/29/2021   LONG TERM GOALS: Target date: 02/19/2022  Pt will be independent with advanced HEP. Baseline:  Goal status: INITIAL  2.  Patient to improve time with TUG to 15 seconds or less to decrease his risk of falling. Baseline: 23.1 sec Goal status: INITIAL  3.  Pt will increase BLE  strength to at least 4+/5 to allow him to perform functional tasks with increased ease. Baseline:  Goal status: INITIAL  4.  Patient to report ability to pick up wood from the pile and place in his wood stove without increased pain. Baseline:  Goal status: INITIAL  5.  Patient to deny any falls or losses of balance within the last week of treatment with noted improved foot clearance during PT session. Baseline:  Goal status: INITIAL  PLAN:  PT FREQUENCY: 2x/week  PT DURATION: 8 weeks  PLANNED INTERVENTIONS: Therapeutic exercises, Therapeutic activity, Neuromuscular re-education, Balance training, Gait training, Patient/Family education, Self Care, Joint mobilization, Joint manipulation, Stair training, Aquatic Therapy, Dry Needling, Electrical stimulation, Spinal manipulation, Spinal mobilization, Cryotherapy, Moist heat, Taping, Traction, Ultrasound, Ionotophoresis 18m/ml Dexamethasone, Manual therapy, and Re-evaluation.  PLAN FOR NEXT SESSION: assess and progress HEP as indicated, strengthening, balance   SJuel Burrow PT 12/29/2021, 3:23 PM   BPhys[facility] LLC[address] SWauhillauGDouglas City [place] [address]Phone # [phone] [phone]

## 2021-12-31 ENCOUNTER — Ambulatory Visit: Payer: Medicare Other | Admitting: Rehabilitative and Restorative Service Providers"

## 2021-12-31 ENCOUNTER — Encounter: Payer: Self-pay | Admitting: Rehabilitative and Restorative Service Providers"

## 2021-12-31 DIAGNOSIS — R2689 Other abnormalities of gait and mobility: Secondary | ICD-10-CM

## 2021-12-31 DIAGNOSIS — R262 Difficulty in walking, not elsewhere classified: Secondary | ICD-10-CM

## 2021-12-31 DIAGNOSIS — M6281 Muscle weakness (generalized): Secondary | ICD-10-CM

## 2021-12-31 DIAGNOSIS — M5441 Lumbago with sciatica, right side: Secondary | ICD-10-CM | POA: Diagnosis not present

## 2021-12-31 DIAGNOSIS — M5459 Other low back pain: Secondary | ICD-10-CM

## 2021-12-31 DIAGNOSIS — R269 Unspecified abnormalities of gait and mobility: Secondary | ICD-10-CM | POA: Diagnosis not present

## 2021-12-31 NOTE — Therapy (Signed)
OUTPATIENT PHYSICAL THERAPY TREATMENT NOTE   Patient Name: Thomas Joseph MRN: [id number] DOB:1933/11/23, 88 y.o., male Today's Date: 12/31/2021  END OF SESSION:  PT End of Session - 12/31/21 1148     Visit Number 3    Date for PT Re-Evaluation 02/19/22    Authorization Type [employer] Medicare    Progress Note Due on Visit 10    PT Start Time 1145    PT Stop Time 1225    PT Time Calculation (min) 40 min    Activity Tolerance Patient tolerated treatment well    Behavior During Therapy WFL for tasks assessed/performed             Past Medical History:  Diagnosis Date   Anemia    BPH (benign prostatic hyperplasia)    Chronic kidney disease (CKD) stage G3a/A1, moderately decreased glomerular filtration rate (GFR) between 45-59 mL/min/1.73 square meter and albuminuria creatinine ratio less than 30 mg/g ([place]) 03/07/2020   Diabetes mellitus without complication [facility])    ED (erectile dysfunction)    Family history of adverse reaction to anesthesia    son had some problem years ago, pt unsure of what exact problem was   Hypertension    Obesity    Pneumonia    Past Surgical History:  Procedure Laterality Date    LEFT KNEE ARTHROCOPY     COLONOSCOPY WITH PROPOFOL Left 05/07/2020   Procedure: COLONOSCOPY WITH PROPOFOL;  Surgeon: Otis Brace, MD;  Location: [facility];  Service: Gastroenterology;  Laterality: Left;   COLONOSCOPY WITH PROPOFOL N/A 05/11/2020   Procedure: COLONOSCOPY WITH PROPOFOL;  Surgeon: Wilford Corner, MD;  Location: [facility];  Service: Endoscopy;  Laterality: N/A;   ESOPHAGOGASTRODUODENOSCOPY N/A 05/06/2020   Procedure: ESOPHAGOGASTRODUODENOSCOPY (EGD);  Surgeon: Arta Silence, MD;  Location: [facility];  Service: Endoscopy;  Laterality: N/A;   HERNIA REPAIR     IR ANGIOGRAM SELECTIVE EACH ADDITIONAL VESSEL  05/11/2020   IR ANGIOGRAM VISCERAL SELECTIVE  05/11/2020   IR EMBO ART  VEN HEMORR LYMPH EXTRAV  INC GUIDE ROADMAPPING  05/11/2020   IR IVC  FILTER PLMT / S&I /IMG GUID/MOD SED  05/11/2020   IR RADIOLOGIST EVAL & MGMT  08/27/2020   IR RADIOLOGIST EVAL & MGMT  02/26/2021   IR US GUIDE VASC ACCESS RIGHT  05/11/2020   IR VENOGRAM RENAL BI  05/11/2020   TRANSURETHRAL RESECTION OF PROSTATE N/A 12/26/2014   Procedure: TRANSURETHRAL RESECTION OF THE PROSTATE;  Surgeon: Cleon Gustin, MD;  Location: [facility];  Service: Urology;  Laterality: N/A;   Patient Active Problem List   Diagnosis Date Noted   Acute embolism and thrombosis of unspecified deep veins of unspecified lower extremity ([place]) 05/31/2021   Shoulder joint pain 05/31/2021   Abnormal gait 02/24/2021   Acute deep vein thrombosis (DVT) of calf muscle vein of left lower extremity ([place]) 02/24/2021   Acute pulmonary embolism ([place]) 02/24/2021   Hemorrhage of colon due to diverticulosis 02/24/2021   History of DVT (deep vein thrombosis) 02/24/2021   Malnutrition of mild degree Altamease [place]iler: 75% to less than 90% of standard weight) ([place]) 02/24/2021   Personal history of pulmonary embolism 02/24/2021   Polymyalgia rheumatica ([place]) 02/24/2021   Presence of other vascular implants and grafts 02/24/2021   Urinary hesitancy 02/24/2021   Vitamin B deficiency 02/24/2021   Hypomagnesemia 05/11/2020   Atrial fibrillation with RVR ([place]) 05/11/2020   GI bleed 05/05/2020   AKI (acute kidney injury) ([place])    2019 novel coronavirus disease (COVID-19) 03/07/2020  Anemia 03/07/2020   Arthropathy 03/07/2020   Body mass index (BMI) 32.0-32.9, adult 03/07/2020   Body mass index (BMI) 34.0-34.9, adult 03/07/2020   Chronic kidney disease (CKD) stage G3a/A1, moderately decreased glomerular filtration rate (GFR) between 45-59 mL/min/1.73 square meter and albuminuria creatinine ratio less than 30 mg/g ([facility]) 03/07/2020   Chronic sinusitis 03/07/2020   Constipation 03/07/2020   Diabetic renal disease ([place]) 03/07/2020   Hardening of the aorta (main artery of the heart) ([place]) 03/07/2020   Hyperlipidemia  03/07/2020   Microscopic hematuria 03/07/2020   Nausea 03/07/2020   Other abnormalities of gait and mobility 03/07/2020   Personal history of colonic polyps 03/07/2020   Pneumonia due to SARS-associated coronavirus 03/07/2020   Primary open-angle glaucoma, right eye, mild stage 03/07/2020   Degeneration of lumbar intervertebral disc 03/07/2020   Radiculopathy due to lumbar intervertebral disc disorder 03/07/2020   Right lower quadrant pain 03/07/2020   S/P TURP (status post transurethral resection of prostate) 03/07/2020   Secondary hyperparathyroidism of renal origin ([place]) 03/07/2020   Senile purpura ([place]) 03/07/2020   Sequelae of other specified infectious and parasitic diseases 03/07/2020   Stricture of artery ([place]) 03/07/2020   Subclinical hypothyroidism 03/07/2020   History of COVID-19 04/11/2019   Shortness of breath 04/11/2019   Irregular heart rhythm 04/11/2019   Fatigue 04/11/2019   Generalized weakness 04/11/2019   Acute respiratory failure with hypoxia ([place]) 02/02/2019   Essential hypertension 02/02/2019   Type 2 diabetes mellitus with complication, without long-term current use of insulin ([place]) 02/02/2019   Hypokalemia 02/02/2019   BPH (benign prostatic hyperplasia) 12/26/2014    PCP: Jonathon Jordan, MD  REFERRING PROVIDER: Saintclair Halsted, FNP  REFERRING DIAG: M54.41 Acute right sided low back pain with right-sided sciatica R26.9 Abnormal Gait  Rationale for Evaluation and Treatment: Rehabilitation  THERAPY DIAG:  Other low back pain  Other abnormalities of gait and mobility  Muscle weakness (generalized)  Difficulty in walking, not elsewhere classified  ONSET DATE: November 2023  SUBJECTIVE:                                                                                                                                                                                           SUBJECTIVE STATEMENT: Pt denies current pain.  Continues to report compliance  with HEP.  PERTINENT HISTORY:  Hypertension, chronic kidney disease, diabetes, lower lumbar spondylosis with vacuum disc phenomenon at L5-S1 and bilateral foraminal narrowing, OA, DVT  PAIN:  Are you having pain? Yes: NPRS scale: 0/10 Pain location: low back into right groin Pain description: dull ache Aggravating factors: bending and getting up Relieving factors: lying supine  PRECAUTIONS: Fall  WEIGHT BEARING RESTRICTIONS: No  FALLS:  Has patient fallen in last 6 months? Yes. Number of falls 1 fall while walking outside around his wood pile  LIVING ENVIRONMENT: Lives with: lives with their spouse Lives in: House/apartment Stairs: Yes: External: 3 steps; bilateral but cannot reach both Has following equipment at home: Single point cane, Environmental consultant - 2 wheeled, Wheelchair (manual), and Grab bars  OCCUPATION: retired  PLOF: Independent and Leisure: watching football, gardening, going to [place]: To be able to walk better and have better balance.  NEXT MD VISIT: as needed, apart from yearly scheduled in October 2024  OBJECTIVE:   DIAGNOSTIC FINDINGS:  Cervical CT on 12/29/2020: 1. No acute displaced fracture or traumatic listhesis of the cervical spine. 2. Multilevel degenerative changes of the spine leading to severe C3-C4 and C4-C5 left osseous neural foraminal stenosis.  PATIENT SURVEYS:  12/24/2021:  FOTO 45% (Projected 63% by visit 13  SCREENING FOR RED FLAGS: Bowel or bladder incontinence: No Spinal tumors: No Cauda equina syndrome: No Compression fracture: No Abdominal aneurysm: No  COGNITION: Overall cognitive status: Within functional limits for tasks assessed     SENSATION: WFL  MUSCLE LENGTH: Hamstrings: Tightness bilaterally  POSTURE: rounded shoulders and forward head  PALPATION: Some tight lumbar paraspinals, but no tenderness  LUMBAR ROM:   Limited 25-50% with minimal pain with lumbar flexion  LOWER EXTREMITY ROM:      WFL  LOWER EXTREMITY MMT:    12/24/2021: Right hamstring strength of 4/5, Left is WFL Right quad strength of 4/5, left is WFL Right hip strength of 4-/5, Left hip strength of 4/5  LUMBAR SPECIAL TESTS:  Slump test: Positive  FUNCTIONAL TESTS:   12/24/2021: 5 times sit to stand: 22.3 sec with hands pushing up from chair rails Timed Up and Go (TUG):  23.1 sec  12/29/2021: 3 Minute Walk Test:  308 ft without assistive device (denies pain)  GAIT: Distance walked: 50 Assistive device utilized: None Level of assistance: SBA Comments: Slow gait velocity with shuffling gait pattern  TODAY'S TREATMENT:                                                                                                                               DATE: 12/31/2021 Nustep level 4 x6 min with PT present to discuss status Seated with 2#:  heel/toe raises, marching, LAQ.  2x10 bilat Seated passive hamstring stretch (with PT assist) 2x20 sec bilat Hip adduction ball squeeze 2x10 Hip abduction with green tband 2x10 Seated hamstring curls with green tband 2x10 bilat Seated shoulder ER and horizontal abduction with red tband 2x10 each Sit to/from stand x10 Alt LE toe taps to 6" step 2x10 FWD 6" step ups with 6" step and UE use x10 bilat   DATE: 12/29/2021  Nustep level 4 x6 min with PT present to discuss status Seated with 2#:  heel/toe raises, marching, LAQ.  2x10 bilat 3 min ambulation  around PT gyms:  308 ft Hip adduction ball squeeze 2x10 Hip abduction with red tband 2x10 Seated shoulder ER and horizontal abduction with red tband 2x10 each Sit to stand x10 reps Seated hamstring curls with red tband 2x10 bilat   DATE: 12/24/2021  Seated:  marching, LAQ.  BLE x10 Sit to/from stand x5  Seated hamstring stretch x20 sec bilat   PATIENT EDUCATION:  Education details: Issued HEP Person educated: Patient Education method: Explanation, Demonstration, and Handouts Education comprehension:  verbalized understanding and returned demonstration  HOME EXERCISE PROGRAM: Access Code: [email or web address] URL: [email or web address] Date: 12/29/2021 Prepared by: Shelby Dubin Deeanne Deininger  Exercises - Seated Hamstring Stretch  - 1 x daily - 7 x weekly - 1 sets - 2 reps - 20 sec hold - Seated Long Arc Quad  - 1 x daily - 7 x weekly - 2 sets - 10 reps - Seated March  - 1 x daily - 7 x weekly - 2 sets - 10 reps - Sit to Stand  - 1 x daily - 7 x weekly - 2 sets - 5 reps - Seated Hip Abduction with Resistance  - 1 x daily - 7 x weekly - 2 sets - 10 reps - Shoulder External Rotation and Scapular Retraction with Resistance  - 1 x daily - 7 x weekly - 2 sets - 10 reps - Standing Shoulder Horizontal Abduction with Resistance  - 1 x daily - 7 x weekly - 2 sets - 10 reps  ASSESSMENT:  CLINICAL IMPRESSION: Mr Hipple presents to skilled PT with continued reports of decreased back pain.  Patient able to progress with adding standing balance activity of toe taps, requiring occasional UE support for improved balance.  Patient requires UE support with FWD step ups.  Pt continues to deny pain throughout PT session.  Patient requires cuing for improved upright posture during PT session, especially with seated exercises.  OBJECTIVE IMPAIRMENTS: decreased activity tolerance, decreased balance, difficulty walking, decreased strength, increased muscle spasms, impaired flexibility, postural dysfunction, and pain.   ACTIVITY LIMITATIONS: lifting, bending, standing, and squatting  PARTICIPATION LIMITATIONS: cleaning, community activity, and yard work  PERSONAL FACTORS: Age, Past/current experiences, and 3+ comorbidities: OA, HTN, weakness since COVID in 2021, DM  are also affecting patient's functional outcome.   REHAB POTENTIAL: Good  CLINICAL DECISION MAKING: Evolving/moderate complexity  EVALUATION COMPLEXITY: Moderate   GOALS: Goals reviewed with patient? Yes  SHORT TERM GOALS: Target date:  01/14/2021  Pt to be independent with initial HEP. Baseline: Goal status: MET  2.  Pt able to complete a 3 minute walk test. Baseline:  Goal status: MET on 12/29/2021   LONG TERM GOALS: Target date: 02/19/2022  Pt will be independent with advanced HEP. Baseline:  Goal status: INITIAL  2.  Patient to improve time with TUG to 15 seconds or less to decrease his risk of falling. Baseline: 23.1 sec Goal status: INITIAL  3.  Pt will increase BLE strength to at least 4+/5 to allow him to perform functional tasks with increased ease. Baseline:  Goal status: INITIAL  4.  Patient to report ability to pick up wood from the pile and place in his wood stove without increased pain. Baseline:  Goal status: INITIAL  5.  Patient to deny any falls or losses of balance within the last week of treatment with noted improved foot clearance during PT session. Baseline:  Goal status: INITIAL  PLAN:  PT FREQUENCY: 2x/week  PT DURATION: 8 weeks  PLANNED INTERVENTIONS: Therapeutic exercises, Therapeutic  activity, Neuromuscular re-education, Balance training, Gait training, Patient/Family education, Self Care, Joint mobilization, Joint manipulation, Stair training, Aquatic Therapy, Dry Needling, Electrical stimulation, Spinal manipulation, Spinal mobilization, Cryotherapy, Moist heat, Taping, Traction, Ultrasound, Ionotophoresis 53m/ml Dexamethasone, Manual therapy, and Re-evaluation.  PLAN FOR NEXT SESSION: assess and progress HEP as indicated, strengthening, balance   SJuel Burrow PT 12/31/2021, 12:27 PM   [facility] [place] [address]Phone # [phone] [phone]

## 2022-01-25 ENCOUNTER — Encounter: Payer: Self-pay | Admitting: Rehabilitative and Restorative Service Providers"

## 2022-01-25 ENCOUNTER — Ambulatory Visit: Payer: Medicare Other | Attending: Family Medicine | Admitting: Rehabilitative and Restorative Service Providers"

## 2022-01-25 DIAGNOSIS — M6281 Muscle weakness (generalized): Secondary | ICD-10-CM | POA: Diagnosis not present

## 2022-01-25 DIAGNOSIS — R2689 Other abnormalities of gait and mobility: Secondary | ICD-10-CM | POA: Diagnosis not present

## 2022-01-25 DIAGNOSIS — M5459 Other low back pain: Secondary | ICD-10-CM | POA: Diagnosis not present

## 2022-01-25 DIAGNOSIS — R262 Difficulty in walking, not elsewhere classified: Secondary | ICD-10-CM

## 2022-01-25 NOTE — Therapy (Signed)
OUTPATIENT PHYSICAL THERAPY TREATMENT NOTE   Patient Name: Thomas Joseph MRN: [id number] DOB:11-Dec-1933, 89 y.o., male Today's Date: 01/25/2022  END OF SESSION:  PT End of Session - 01/25/22 0922     Visit Number 4    Date for PT Re-Evaluation 02/19/22    Authorization Type [employer]    Progress Note Due on Visit 10    PT Start Time 0919    PT Stop Time 1000    PT Time Calculation (min) 41 min    Activity Tolerance Patient tolerated treatment well    Behavior During Therapy WFL for tasks assessed/performed             Past Medical History:  Diagnosis Date   Anemia    BPH (benign prostatic hyperplasia)    Chronic kidney disease (CKD) stage G3a/A1, moderately decreased glomerular filtration rate (GFR) between 45-59 mL/min/1.73 square meter and albuminuria creatinine ratio less than 30 mg/g (HCC) 03/07/2020   Diabetes mellitus without complication [facility])    ED (erectile dysfunction)    Family history of adverse reaction to anesthesia    son had some problem years ago, pt unsure of what exact problem was   Hypertension    Obesity    Pneumonia    Past Surgical History:  Procedure Laterality Date    LEFT KNEE ARTHROCOPY     COLONOSCOPY WITH PROPOFOL Left 05/07/2020   Procedure: COLONOSCOPY WITH PROPOFOL;  Surgeon: Kathi Der, MD;  Location: [facility] ENDOSCOPY;  Service: Gastroenterology;  Laterality: Left;   COLONOSCOPY WITH PROPOFOL N/A 05/11/2020   Procedure: COLONOSCOPY WITH PROPOFOL;  Surgeon: Charlott Rakes, MD;  Location: [facility]COPY;  Service: Endoscopy;  Laterality: N/A;   ESOPHAGOGASTRODUODENOSCOPY N/A 05/06/2020   Procedure: ESOPHAGOGASTRODUODENOSCOPY (EGD);  Surgeon: Willis Modena, MD;  Location: [facility] ENDOSCOPY;  Service: Endoscopy;  Laterality: N/A;   HERNIA REPAIR     IR ANGIOGRAM SELECTIVE EACH ADDITIONAL VESSEL  05/11/2020   IR ANGIOGRAM VISCERAL SELECTIVE  05/11/2020   IR EMBO ART  VEN HEMORR LYMPH EXTRAV  INC GUIDE ROADMAPPING  05/11/2020   IR IVC FILTER  PLMT / S&I /IMG GUID/MOD SED  05/11/2020   IR RADIOLOGIST EVAL & MGMT  08/27/2020   IR RADIOLOGIST EVAL & MGMT  02/26/2021   IR US GUIDE VASC ACCESS RIGHT  05/11/2020   IR VENOGRAM RENAL BI  05/11/2020   TRANSURETHRAL RESECTION OF PROSTATE N/A 12/26/2014   Procedure: TRANSURETHRAL RESECTION OF THE PROSTATE;  Surgeon: Malen Gauze, MD;  Location: [facility] ORS;  Service: Urology;  Laterality: N/A;   Patient Active Problem List   Diagnosis Date Noted   Acute embolism and thrombosis of unspecified deep veins of unspecified lower extremity (HCC) 05/31/2021   Shoulder joint pain 05/31/2021   Abnormal gait 02/24/2021   Acute deep vein thrombosis (DVT) of calf muscle vein of left lower extremity (HCC) 02/24/2021   Acute pulmonary embolism (HCC) 02/24/2021   Hemorrhage of colon due to diverticulosis 02/24/2021   History of DVT (deep vein thrombosis) 02/24/2021   Malnutrition of mild degree Lily Kocher: 75% to less than 90% of standard weight) (HCC) 02/24/2021   Personal history of pulmonary embolism 02/24/2021   Polymyalgia rheumatica (HCC) 02/24/2021   Presence of other vascular implants and grafts 02/24/2021   Urinary hesitancy 02/24/2021   Vitamin B deficiency 02/24/2021   Hypomagnesemia 05/11/2020   Atrial fibrillation with RVR (HCC) 05/11/2020   GI bleed 05/05/2020   AKI (acute kidney injury) (HCC)    2019 novel coronavirus disease (COVID-19) 03/07/2020  Anemia 03/07/2020   Arthropathy 03/07/2020   Body mass index (BMI) 32.0-32.9, adult 03/07/2020   Body mass index (BMI) 34.0-34.9, adult 03/07/2020   Chronic kidney disease (CKD) stage G3a/A1, moderately decreased glomerular filtration rate (GFR) between 45-59 mL/min/1.73 square meter and albuminuria creatinine ratio less than 30 mg/g (HCC) 03/07/2020   Chronic sinusitis 03/07/2020   Constipation 03/07/2020   Diabetic renal disease (HCC) 03/07/2020   Hardening of the aorta (main artery of the heart) (HCC) 03/07/2020   Hyperlipidemia 03/07/2020    Microscopic hematuria 03/07/2020   Nausea 03/07/2020   Other abnormalities of gait and mobility 03/07/2020   Personal history of colonic polyps 03/07/2020   Pneumonia due to SARS-associated coronavirus 03/07/2020   Primary open-angle glaucoma, right eye, mild stage 03/07/2020   Degeneration of lumbar intervertebral disc 03/07/2020   Radiculopathy due to lumbar intervertebral disc disorder 03/07/2020   Right lower quadrant pain 03/07/2020   S/P TURP (status post transurethral resection of prostate) 03/07/2020   Secondary hyperparathyroidism of renal origin (HCC) 03/07/2020   Senile purpura (HCC) 03/07/2020   Sequelae of other specified infectious and parasitic diseases 03/07/2020   Stricture of artery (HCC) 03/07/2020   Subclinical hypothyroidism 03/07/2020   History of COVID-19 04/11/2019   Shortness of breath 04/11/2019   Irregular heart rhythm 04/11/2019   Fatigue 04/11/2019   Generalized weakness 04/11/2019   Acute respiratory failure with hypoxia (HCC) 02/02/2019   Essential hypertension 02/02/2019   Type 2 diabetes mellitus with complication, without long-term current use of insulin (HCC) 02/02/2019   Hypokalemia 02/02/2019   BPH (benign prostatic hyperplasia) 12/26/2014    PCP: Mila Palmer, MD  REFERRING PROVIDER: Camie Patience, FNP  REFERRING DIAG: M54.41 Acute right sided low back pain with right-sided sciatica R26.9 Abnormal Gait  Rationale for Evaluation and Treatment: Rehabilitation  THERAPY DIAG:  Other low back pain  Other abnormalities of gait and mobility  Muscle weakness (generalized)  Difficulty in walking, not elsewhere classified  ONSET DATE: November 2023  SUBJECTIVE:                                                                                                                                                                                           SUBJECTIVE STATEMENT: Pt states that he had a good trip to [place] and [place].   Denies any falls, but does admit to some stumbles where he caught himself before he fell.  PERTINENT HISTORY:  Hypertension, chronic kidney disease, diabetes, lower lumbar spondylosis with vacuum disc phenomenon at L5-S1 and bilateral foraminal narrowing, OA, DVT  PAIN:  Are you having pain? Yes: NPRS scale: 0/10 Pain location: low back  into right groin Pain description: dull ache Aggravating factors: bending and getting up Relieving factors: lying supine  PRECAUTIONS: Fall  WEIGHT BEARING RESTRICTIONS: No  FALLS:  Has patient fallen in last 6 months? Yes. Number of falls 1 fall while walking outside around his wood pile  LIVING ENVIRONMENT: Lives with: lives with their spouse Lives in: House/apartment Stairs: Yes: External: 3 steps; bilateral but cannot reach both Has following equipment at home: Single point cane, Environmental consultant - 2 wheeled, Wheelchair (manual), and Grab bars  OCCUPATION: retired  PLOF: Independent and Leisure: watching football, gardening, going to [place]: To be able to walk better and have better balance.  NEXT MD VISIT: as needed, apart from yearly scheduled in October 2024  OBJECTIVE:   DIAGNOSTIC FINDINGS:  Cervical CT on 12/29/2020: 1. No acute displaced fracture or traumatic listhesis of the cervical spine. 2. Multilevel degenerative changes of the spine leading to severe C3-C4 and C4-C5 left osseous neural foraminal stenosis.  PATIENT SURVEYS:  12/24/2021:  FOTO 45% (Projected 63% by visit 13  SCREENING FOR RED FLAGS: Bowel or bladder incontinence: No Spinal tumors: No Cauda equina syndrome: No Compression fracture: No Abdominal aneurysm: No  COGNITION: Overall cognitive status: Within functional limits for tasks assessed     SENSATION: WFL  MUSCLE LENGTH: Hamstrings: Tightness bilaterally  POSTURE: rounded shoulders and forward head  PALPATION: Some tight lumbar paraspinals, but no tenderness  LUMBAR ROM:   Limited  25-50% with minimal pain with lumbar flexion  LOWER EXTREMITY ROM:     WFL  LOWER EXTREMITY MMT:    12/24/2021: Right hamstring strength of 4/5, Left is WFL Right quad strength of 4/5, left is WFL Right hip strength of 4-/5, Left hip strength of 4/5  LUMBAR SPECIAL TESTS:  Slump test: Positive  FUNCTIONAL TESTS:   12/24/2021: 5 times sit to stand: 22.3 sec with hands pushing up from chair rails Timed Up and Go (TUG):  23.1 sec  12/29/2021: 3 Minute Walk Test:  308 ft without assistive device (denies pain)  01/25/2022: 5 times sit to stand: 17.3 sec with hands pushing up from chair rails Timed Up and Go (TUG):  19.2 sec  GAIT: Distance walked: 50 Assistive device utilized: None Level of assistance: SBA Comments: Slow gait velocity with shuffling gait pattern  TODAY'S TREATMENT:                                                                                                                               DATE: 01/25/2022 Nustep level 4 x6 min with PT present to discuss status Seated with 2#:  heel/toe raises, marching, LAQ.  2x10 bilat Hip adduction ball squeeze 2x10 Hip abduction with green tband 2x10 Seated hamstring curls with green tband 2x10 bilat 5 times sit to stand, TUG Seated shoulder ER and horizontal abduction with red tband 2x10 each Alt LE toe taps to 6" step 2x10 FWD 6" step ups with 6" step and  UE use x10 bilat Seated passive hamstring stretch (with PT assist) 2x20 sec bilat   DATE: 12/31/2021 Nustep level 4 x6 min with PT present to discuss status Seated with 2#:  heel/toe raises, marching, LAQ.  2x10 bilat Seated passive hamstring stretch (with PT assist) 2x20 sec bilat Hip adduction ball squeeze 2x10 Hip abduction with green tband 2x10 Seated hamstring curls with green tband 2x10 bilat Seated shoulder ER and horizontal abduction with red tband 2x10 each Sit to/from stand x10 Alt LE toe taps to 6" step 2x10 FWD 6" step ups with 6" step and UE use  x10 bilat   DATE: 12/29/2021  Nustep level 4 x6 min with PT present to discuss status Seated with 2#:  heel/toe raises, marching, LAQ.  2x10 bilat 3 min ambulation around PT gyms:  308 ft Hip adduction ball squeeze 2x10 Hip abduction with red tband 2x10 Seated shoulder ER and horizontal abduction with red tband 2x10 each Sit to stand x10 reps Seated hamstring curls with red tband 2x10 bilat    PATIENT EDUCATION:  Education details: Issued HEP Person educated: Patient Education method: Explanation, Demonstration, and Handouts Education comprehension: verbalized understanding and returned demonstration  HOME EXERCISE PROGRAM: Access Code: [email or web address] URL: [email or web address] Date: 12/29/2021 Prepared by: Shelby Dubin Dezmen Alcock  Exercises - Seated Hamstring Stretch  - 1 x daily - 7 x weekly - 1 sets - 2 reps - 20 sec hold - Seated Long Arc Quad  - 1 x daily - 7 x weekly - 2 sets - 10 reps - Seated March  - 1 x daily - 7 x weekly - 2 sets - 10 reps - Sit to Stand  - 1 x daily - 7 x weekly - 2 sets - 5 reps - Seated Hip Abduction with Resistance  - 1 x daily - 7 x weekly - 2 sets - 10 reps - Shoulder External Rotation and Scapular Retraction with Resistance  - 1 x daily - 7 x weekly - 2 sets - 10 reps - Standing Shoulder Horizontal Abduction with Resistance  - 1 x daily - 7 x weekly - 2 sets - 10 reps  ASSESSMENT:  CLINICAL IMPRESSION: Mr Lebleu presents to skilled PT after having some time off secondary to holidays and traveling out of town.  Patient denies any falls, but does admit that he did have some stumbles where he was able to self correct his balance.  Patient has noted increased time on TUG and 5 times sit to/from stand, but has not yet met goal.  Patient requires occasional UE support with alt LE toe tap to maintain balance, especially on first set.  Patient continues to require skilled PT to progress towards goal related activities and decreased risk of  falling.  OBJECTIVE IMPAIRMENTS: decreased activity tolerance, decreased balance, difficulty walking, decreased strength, increased muscle spasms, impaired flexibility, postural dysfunction, and pain.   ACTIVITY LIMITATIONS: lifting, bending, standing, and squatting  PARTICIPATION LIMITATIONS: cleaning, community activity, and yard work  PERSONAL FACTORS: Age, Past/current experiences, and 3+ comorbidities: OA, HTN, weakness since COVID in 2021, DM  are also affecting patient's functional outcome.   REHAB POTENTIAL: Good  CLINICAL DECISION MAKING: Evolving/moderate complexity  EVALUATION COMPLEXITY: Moderate   GOALS: Goals reviewed with patient? Yes  SHORT TERM GOALS: Target date: 01/14/2021  Pt to be independent with initial HEP. Baseline: Goal status: MET  2.  Pt able to complete a 3 minute walk test. Baseline:  Goal status: MET on 12/29/2021   LONG TERM GOALS: Target  date: 02/19/2022  Pt will be independent with advanced HEP. Baseline:  Goal status: IN PROGRESS  2.  Patient to improve time with TUG to 15 seconds or less to decrease his risk of falling. Baseline: 23.1 sec Goal status: IN PROGRESS (see above)  3.  Pt will increase BLE strength to at least 4+/5 to allow him to perform functional tasks with increased ease. Baseline:  Goal status: INITIAL  4.  Patient to report ability to pick up wood from the pile and place in his wood stove without increased pain. Baseline:  Goal status: IN PROGRESS  5.  Patient to deny any falls or losses of balance within the last week of treatment with noted improved foot clearance during PT session. Baseline:  Goal status: IN PROGRESS  PLAN:  PT FREQUENCY: 2x/week  PT DURATION: 8 weeks  PLANNED INTERVENTIONS: Therapeutic exercises, Therapeutic activity, Neuromuscular re-education, Balance training, Gait training, Patient/Family education, Self Care, Joint mobilization, Joint manipulation, Stair training, Aquatic Therapy, Dry  Needling, Electrical stimulation, Spinal manipulation, Spinal mobilization, Cryotherapy, Moist heat, Taping, Traction, Ultrasound, Ionotophoresis 4mg /ml Dexamethasone, Manual therapy, and Re-evaluation.  PLAN FOR NEXT SESSION: assess and progress HEP as indicated, strengthening, balance   , PT 01/25/2022, 10:11 AM   [facility] [address] [place], [place] Phone # [phone] Fax [phone]

## 2022-01-29 ENCOUNTER — Encounter: Payer: Medicare Other | Admitting: Rehabilitative and Restorative Service Providers"

## 2022-02-01 ENCOUNTER — Encounter: Payer: Self-pay | Admitting: Rehabilitative and Restorative Service Providers"

## 2022-02-01 ENCOUNTER — Ambulatory Visit: Payer: Medicare Other | Admitting: Rehabilitative and Restorative Service Providers"

## 2022-02-01 DIAGNOSIS — M5459 Other low back pain: Secondary | ICD-10-CM

## 2022-02-01 DIAGNOSIS — M6281 Muscle weakness (generalized): Secondary | ICD-10-CM | POA: Diagnosis not present

## 2022-02-01 DIAGNOSIS — R262 Difficulty in walking, not elsewhere classified: Secondary | ICD-10-CM | POA: Diagnosis not present

## 2022-02-01 DIAGNOSIS — R2689 Other abnormalities of gait and mobility: Secondary | ICD-10-CM | POA: Diagnosis not present

## 2022-02-01 NOTE — Therapy (Signed)
OUTPATIENT PHYSICAL THERAPY TREATMENT NOTE   Patient Name: Thomas Joseph MRN: [id number] DOB:[birth year], 89 y.o., male Today's Date: 02/01/2022  END OF SESSION:  PT End of Session - 02/01/22 0935     Visit Number 5    Date for PT Re-Evaluation 02/19/22    Authorization Type [employer] Medicare    Progress Note Due on Visit 10    PT Start Time 0930    PT Stop Time 1010    PT Time Calculation (min) 40 min    Activity Tolerance Patient tolerated treatment well    Behavior During Therapy WFL for tasks assessed/performed             Past Medical History:  Diagnosis Date   Anemia    BPH (benign prostatic hyperplasia)    Chronic kidney disease (CKD) stage G3a/A1, moderately decreased glomerular filtration rate (GFR) between 45-59 mL/min/1.73 square meter and albuminuria creatinine ratio less than 30 mg/g ([place]) 03/07/2020   Diabetes mellitus without complication [facility])    ED (erectile dysfunction)    Family history of adverse reaction to anesthesia    son had some problem years ago, pt unsure of what exact problem was   Hypertension    Obesity    Pneumonia    Past Surgical History:  Procedure Laterality Date    LEFT KNEE ARTHROCOPY     COLONOSCOPY WITH PROPOFOL Left 05/07/2020   Procedure: COLONOSCOPY WITH PROPOFOL;  Surgeon: Otis Brace, MD;  Location: [facility];  Service: Gastroenterology;  Laterality: Left;   COLONOSCOPY WITH PROPOFOL N/A 05/11/2020   Procedure: COLONOSCOPY WITH PROPOFOL;  Surgeon: Wilford Corner, MD;  Location: [facility];  Service: Endoscopy;  Laterality: N/A;   ESOPHAGOGASTRODUODENOSCOPY N/A 05/06/2020   Procedure: ESOPHAGOGASTRODUODENOSCOPY (EGD);  Surgeon: Arta Silence, MD;  Location: [facility] ENDOSCOPY;  Service: Endoscopy;  Laterality: N/A;   HERNIA REPAIR     IR ANGIOGRAM SELECTIVE EACH ADDITIONAL VESSEL  05/11/2020   IR ANGIOGRAM VISCERAL SELECTIVE  05/11/2020   IR EMBO ART  VEN HEMORR LYMPH EXTRAV  INC GUIDE ROADMAPPING  05/11/2020   IR IVC FILTER  PLMT / S&I /IMG GUID/MOD SED  05/11/2020   IR RADIOLOGIST EVAL & MGMT  08/27/2020   IR RADIOLOGIST EVAL & MGMT  02/26/2021   IR US GUIDE VASC ACCESS RIGHT  05/11/2020   IR VENOGRAM RENAL BI  05/11/2020   TRANSURETHRAL RESECTION OF PROSTATE N/A 12/26/2014   Procedure: TRANSURETHRAL RESECTION OF THE PROSTATE;  Surgeon: Cleon Gustin, MD;  Location: [facility];  Service: Urology;  Laterality: N/A;   Patient Active Problem List   Diagnosis Date Noted   Acute embolism and thrombosis of unspecified deep veins of unspecified lower extremity ([place]) 05/31/2021   Shoulder joint pain 05/31/2021   Abnormal gait 02/24/2021   Acute deep vein thrombosis (DVT) of calf muscle vein of left lower extremity ([place]) 02/24/2021   Acute pulmonary embolism ([place]) 02/24/2021   Hemorrhage of colon due to diverticulosis 02/24/2021   History of DVT (deep vein thrombosis) 02/24/2021   Malnutrition of mild degree Altamease [place]iler: 75% to less than 90% of standard weight) ([place]) 02/24/2021   Personal history of pulmonary embolism 02/24/2021   Polymyalgia rheumatica ([place]) 02/24/2021   Presence of other vascular implants and grafts 02/24/2021   Urinary hesitancy 02/24/2021   Vitamin B deficiency 02/24/2021   Hypomagnesemia 05/11/2020   Atrial fibrillation with RVR ([place]) 05/11/2020   GI bleed 05/05/2020   AKI (acute kidney injury) ([place])    2019 novel coronavirus disease (COVID-19) 03/07/2020  Anemia 03/07/2020   Arthropathy 03/07/2020   Body mass index (BMI) 32.0-32.9, adult 03/07/2020   Body mass index (BMI) 34.0-34.9, adult 03/07/2020   Chronic kidney disease (CKD) stage G3a/A1, moderately decreased glomerular filtration rate (GFR) between 45-59 mL/min/1.73 square meter and albuminuria creatinine ratio less than 30 mg/g ([facility]) 03/07/2020   Chronic sinusitis 03/07/2020   Constipation 03/07/2020   Diabetic renal disease ([place]) 03/07/2020   Hardening of the aorta (main artery of the heart) ([place]) 03/07/2020   Hyperlipidemia 03/07/2020    Microscopic hematuria 03/07/2020   Nausea 03/07/2020   Other abnormalities of gait and mobility 03/07/2020   Personal history of colonic polyps 03/07/2020   Pneumonia due to SARS-associated coronavirus 03/07/2020   Primary open-angle glaucoma, right eye, mild stage 03/07/2020   Degeneration of lumbar intervertebral disc 03/07/2020   Radiculopathy due to lumbar intervertebral disc disorder 03/07/2020   Right lower quadrant pain 03/07/2020   S/P TURP (status post transurethral resection of prostate) 03/07/2020   Secondary hyperparathyroidism of renal origin ([place]) 03/07/2020   Senile purpura ([place]) 03/07/2020   Sequelae of other specified infectious and parasitic diseases 03/07/2020   Stricture of artery ([place]) 03/07/2020   Subclinical hypothyroidism 03/07/2020   History of COVID-19 04/11/2019   Shortness of breath 04/11/2019   Irregular heart rhythm 04/11/2019   Fatigue 04/11/2019   Generalized weakness 04/11/2019   Acute respiratory failure with hypoxia ([place]) 02/02/2019   Essential hypertension 02/02/2019   Type 2 diabetes mellitus with complication, without long-term current use of insulin ([place]) 02/02/2019   Hypokalemia 02/02/2019   BPH (benign prostatic hyperplasia) 12/26/2014    PCP: Jonathon Jordan, MD  REFERRING PROVIDER: Saintclair Halsted, FNP  REFERRING DIAG: M54.41 Acute right sided low back pain with right-sided sciatica R26.9 Abnormal Gait  Rationale for Evaluation and Treatment: Rehabilitation  THERAPY DIAG:  Other low back pain  Other abnormalities of gait and mobility  Muscle weakness (generalized)  Difficulty in walking, not elsewhere classified  ONSET DATE: November 2023  SUBJECTIVE:                                                                                                                                                                                           SUBJECTIVE STATEMENT: Pt states that he had some pain over the weekend, but denies any pain  this morning.  Pt continues to deny falls.  PERTINENT HISTORY:  Hypertension, chronic kidney disease, diabetes, lower lumbar spondylosis with vacuum disc phenomenon at L5-S1 and bilateral foraminal narrowing, OA, DVT  PAIN:  Are you having pain? Yes: NPRS scale: 0/10 Pain location: low back into right groin Pain description: dull ache  Aggravating factors: bending and getting up Relieving factors: lying supine  PRECAUTIONS: Fall  WEIGHT BEARING RESTRICTIONS: No  FALLS:  Has patient fallen in last 6 months? Yes. Number of falls 1 fall while walking outside around his wood pile  LIVING ENVIRONMENT: Lives with: lives with their spouse Lives in: House/apartment Stairs: Yes: External: 3 steps; bilateral but cannot reach both Has following equipment at home: Single point cane, Environmental consultant - 2 wheeled, Wheelchair (manual), and Grab bars  OCCUPATION: retired  PLOF: Independent and Leisure: watching football, gardening, going to [employer]  PATIENT GOALS: To be able to walk better and have better balance.  NEXT MD VISIT: as needed, apart from yearly scheduled in October 2024  OBJECTIVE:   DIAGNOSTIC FINDINGS:  Cervical CT on 12/29/2020: 1. No acute displaced fracture or traumatic listhesis of the cervical spine. 2. Multilevel degenerative changes of the spine leading to severe C3-C4 and C4-C5 left osseous neural foraminal stenosis.  PATIENT SURVEYS:  12/24/2021:  FOTO 45% (Projected 63% by visit 13  SCREENING FOR RED FLAGS: Bowel or bladder incontinence: No Spinal tumors: No Cauda equina syndrome: No Compression fracture: No Abdominal aneurysm: No  COGNITION: Overall cognitive status: Within functional limits for tasks assessed     SENSATION: WFL  MUSCLE LENGTH: Hamstrings: Tightness bilaterally  POSTURE: rounded shoulders and forward head  PALPATION: Some tight lumbar paraspinals, but no tenderness  LUMBAR ROM:   Limited 25-50% with minimal pain with lumbar  flexion  LOWER EXTREMITY ROM:     WFL  LOWER EXTREMITY MMT:    12/24/2021: Right hamstring strength of 4/5, Left is WFL Right quad strength of 4/5, left is WFL Right hip strength of 4-/5, Left hip strength of 4/5  LUMBAR SPECIAL TESTS:  Slump test: Positive  FUNCTIONAL TESTS:   12/24/2021: 5 times sit to stand: 22.3 sec with hands pushing up from chair rails Timed Up and Go (TUG):  23.1 sec  12/29/2021: 3 Minute Walk Test:  308 ft without assistive device (denies pain)  01/25/2022: 5 times sit to stand: 17.3 sec with hands pushing up from chair rails Timed Up and Go (TUG):  19.2 sec  GAIT: Distance walked: 50 Assistive device utilized: None Level of assistance: SBA Comments: Slow gait velocity with shuffling gait pattern  TODAY'S TREATMENT:                                                                                                                               DATE: 02/01/2022 Nustep level 5 x6 min with PT present to discuss status Seated with 2#:  heel/toe raises, marching, LAQ.  2x10 bilat Hip adduction ball squeeze 2x10 Hip abduction with green tband 2x10 Seated hamstring curls with green tband 2x10 bilat Sit to/from stand 2x10 Standing shoulder ER and horizontal abduction with red tband 2x10 each Alt LE toe taps to 6" step 2x10 FWD 6" step ups with 6" step and UE use x10 bilat Standing hamstring stretch 2x20 sec  bilat   DATE: 01/25/2022 Nustep level 4 x6 min with PT present to discuss status Seated with 2#:  heel/toe raises, marching, LAQ.  2x10 bilat Hip adduction ball squeeze 2x10 Hip abduction with green tband 2x10 Seated hamstring curls with green tband 2x10 bilat 5 times sit to stand, TUG Seated shoulder ER and horizontal abduction with red tband 2x10 each Alt LE toe taps to 6" step 2x10 FWD 6" step ups with 6" step and UE use x10 bilat Seated passive hamstring stretch (with PT assist) 2x20 sec bilat   DATE: 12/31/2021 Nustep level 4 x6 min  with PT present to discuss status Seated with 2#:  heel/toe raises, marching, LAQ.  2x10 bilat Seated passive hamstring stretch (with PT assist) 2x20 sec bilat Hip adduction ball squeeze 2x10 Hip abduction with green tband 2x10 Seated hamstring curls with green tband 2x10 bilat Seated shoulder ER and horizontal abduction with red tband 2x10 each Sit to/from stand x10 Alt LE toe taps to 6" step 2x10 FWD 6" step ups with 6" step and UE use x10 bilat    PATIENT EDUCATION:  Education details: Issued HEP Person educated: Patient Education method: Explanation, Demonstration, and Handouts Education comprehension: verbalized understanding and returned demonstration  HOME EXERCISE PROGRAM: Access Code: [email or web address] URL: [email or web address] Date: 12/29/2021 Prepared by: Shelby Dubin Shukri Nistler  Exercises - Seated Hamstring Stretch  - 1 x daily - 7 x weekly - 1 sets - 2 reps - 20 sec hold - Seated Long Arc Quad  - 1 x daily - 7 x weekly - 2 sets - 10 reps - Seated March  - 1 x daily - 7 x weekly - 2 sets - 10 reps - Sit to Stand  - 1 x daily - 7 x weekly - 2 sets - 5 reps - Seated Hip Abduction with Resistance  - 1 x daily - 7 x weekly - 2 sets - 10 reps - Shoulder External Rotation and Scapular Retraction with Resistance  - 1 x daily - 7 x weekly - 2 sets - 10 reps - Standing Shoulder Horizontal Abduction with Resistance  - 1 x daily - 7 x weekly - 2 sets - 10 reps  ASSESSMENT:  CLINICAL IMPRESSION: Mr Puff presents to skilled PT reporting no new complaints.  Patient able to progress from sitting to standing with shoulder exercises to progress standing tolerance and balance.  Patient without a loss of balance during therapy, but does have short, shuffling steps and requires cuing for increased step length during ambulation.  Patient continues to progress towards goal related activities and continues to require skilled PT to progress towards goal related activities.  OBJECTIVE  IMPAIRMENTS: decreased activity tolerance, decreased balance, difficulty walking, decreased strength, increased muscle spasms, impaired flexibility, postural dysfunction, and pain.   ACTIVITY LIMITATIONS: lifting, bending, standing, and squatting  PARTICIPATION LIMITATIONS: cleaning, community activity, and yard work  PERSONAL FACTORS: Age, Past/current experiences, and 3+ comorbidities: OA, HTN, weakness since COVID in 2021, DM  are also affecting patient's functional outcome.   REHAB POTENTIAL: Good  CLINICAL DECISION MAKING: Evolving/moderate complexity  EVALUATION COMPLEXITY: Moderate   GOALS: Goals reviewed with patient? Yes  SHORT TERM GOALS: Target date: 01/14/2021  Pt to be independent with initial HEP. Baseline: Goal status: MET  2.  Pt able to complete a 3 minute walk test. Baseline:  Goal status: MET on 12/29/2021   LONG TERM GOALS: Target date: 02/19/2022  Pt will be independent with advanced HEP. Baseline:  Goal status: IN PROGRESS  2.  Patient to improve time with TUG to 15 seconds or less to decrease his risk of falling. Baseline: 23.1 sec Goal status: IN PROGRESS (see above)  3.  Pt will increase BLE strength to at least 4+/5 to allow him to perform functional tasks with increased ease. Baseline:  Goal status: INITIAL  4.  Patient to report ability to pick up wood from the pile and place in his wood stove without increased pain. Baseline:  Goal status: IN PROGRESS  5.  Patient to deny any falls or losses of balance within the last week of treatment with noted improved foot clearance during PT session. Baseline:  Goal status: IN PROGRESS  PLAN:  PT FREQUENCY: 2x/week  PT DURATION: 8 weeks  PLANNED INTERVENTIONS: Therapeutic exercises, Therapeutic activity, Neuromuscular re-education, Balance training, Gait training, Patient/Family education, Self Care, Joint mobilization, Joint manipulation, Stair training, Aquatic Therapy, Dry Needling, Electrical  stimulation, Spinal manipulation, Spinal mobilization, Cryotherapy, Moist heat, Taping, Traction, Ultrasound, Ionotophoresis 4mg /ml Dexamethasone, Manual therapy, and Re-evaluation.  PLAN FOR NEXT SESSION: assess and progress HEP as indicated, strengthening, balance   , PT 02/01/2022, 10:11 AM   [facility] [address], [address] [place], [place] Phone # [phone] Fax [phone]

## 2022-02-03 DIAGNOSIS — H11133 Conjunctival pigmentations, bilateral: Secondary | ICD-10-CM | POA: Diagnosis not present

## 2022-02-03 DIAGNOSIS — H31092 Other chorioretinal scars, left eye: Secondary | ICD-10-CM | POA: Diagnosis not present

## 2022-02-03 DIAGNOSIS — H02831 Dermatochalasis of right upper eyelid: Secondary | ICD-10-CM | POA: Diagnosis not present

## 2022-02-03 DIAGNOSIS — H11823 Conjunctivochalasis, bilateral: Secondary | ICD-10-CM | POA: Diagnosis not present

## 2022-02-03 DIAGNOSIS — E119 Type 2 diabetes mellitus without complications: Secondary | ICD-10-CM | POA: Diagnosis not present

## 2022-02-03 DIAGNOSIS — H02834 Dermatochalasis of left upper eyelid: Secondary | ICD-10-CM | POA: Diagnosis not present

## 2022-02-03 DIAGNOSIS — H401111 Primary open-angle glaucoma, right eye, mild stage: Secondary | ICD-10-CM | POA: Diagnosis not present

## 2022-02-03 DIAGNOSIS — H40053 Ocular hypertension, bilateral: Secondary | ICD-10-CM | POA: Diagnosis not present

## 2022-02-03 DIAGNOSIS — H2513 Age-related nuclear cataract, bilateral: Secondary | ICD-10-CM | POA: Diagnosis not present

## 2022-02-05 ENCOUNTER — Encounter: Payer: Self-pay | Admitting: Rehabilitative and Restorative Service Providers"

## 2022-02-05 ENCOUNTER — Ambulatory Visit: Payer: Medicare Other | Admitting: Rehabilitative and Restorative Service Providers"

## 2022-02-05 DIAGNOSIS — M5459 Other low back pain: Secondary | ICD-10-CM

## 2022-02-05 DIAGNOSIS — R262 Difficulty in walking, not elsewhere classified: Secondary | ICD-10-CM

## 2022-02-05 DIAGNOSIS — R2689 Other abnormalities of gait and mobility: Secondary | ICD-10-CM | POA: Diagnosis not present

## 2022-02-05 DIAGNOSIS — M6281 Muscle weakness (generalized): Secondary | ICD-10-CM

## 2022-02-05 NOTE — Therapy (Signed)
OUTPATIENT PHYSICAL THERAPY TREATMENT NOTE   Patient Name: Thomas Joseph MRN: [id number] DOB:December 17, 1933, 89 y.o., male Today's Date: 02/05/2022  END OF SESSION:  PT End of Session - 02/05/22 0938     Visit Number 6    Date for PT Re-Evaluation 02/19/22    Authorization Type [employer] Medicare    Progress Note Due on Visit 10    PT Start Time 0930    PT Stop Time 1010    PT Time Calculation (min) 40 min    Activity Tolerance Patient tolerated treatment well    Behavior During Therapy WFL for tasks assessed/performed             Past Medical History:  Diagnosis Date   Anemia    BPH (benign prostatic hyperplasia)    Chronic kidney disease (CKD) stage G3a/A1, moderately decreased glomerular filtration rate (GFR) between 45-59 mL/min/1.73 square meter and albuminuria creatinine ratio less than 30 mg/g (HCC) 03/07/2020   Diabetes mellitus without complication [facility])    ED (erectile dysfunction)    Family history of adverse reaction to anesthesia    son had some problem years ago, pt unsure of what exact problem was   Hypertension    Obesity    Pneumonia    Past Surgical History:  Procedure Laterality Date    LEFT KNEE ARTHROCOPY     COLONOSCOPY WITH PROPOFOL Left 05/07/2020   Procedure: COLONOSCOPY WITH PROPOFOL;  Surgeon: Kathi Der, MD;  Location: [facility] ENDOSCOPY;  Service: Gastroenterology;  Laterality: Left;   COLONOSCOPY WITH PROPOFOL N/A 05/11/2020   Procedure: COLONOSCOPY WITH PROPOFOL;  Surgeon: Charlott Rakes, MD;  Location: [facility] ENDOSCOPY;  Service: Endoscopy;  Laterality: N/A;   ESOPHAGOGASTRODUODENOSCOPY N/A 05/06/2020   Procedure: ESOPHAGOGASTRODUODENOSCOPY (EGD);  Surgeon: Willis Modena, MD;  Location: [facility] ENDOSCOPY;  Service: Endoscopy;  Laterality: N/A;   HERNIA REPAIR     IR ANGIOGRAM SELECTIVE EACH ADDITIONAL VESSEL  05/11/2020   IR ANGIOGRAM VISCERAL SELECTIVE  05/11/2020   IR EMBO ART  VEN HEMORR LYMPH EXTRAV  INC GUIDE ROADMAPPING  05/11/2020   IR IVC FILTER  PLMT / S&I /IMG GUID/MOD SED  05/11/2020   IR RADIOLOGIST EVAL & MGMT  08/27/2020   IR RADIOLOGIST EVAL & MGMT  02/26/2021   IR US GUIDE VASC ACCESS RIGHT  05/11/2020   IR VENOGRAM RENAL BI  05/11/2020   TRANSURETHRAL RESECTION OF PROSTATE N/A 12/26/2014   Procedure: TRANSURETHRAL RESECTION OF THE PROSTATE;  Surgeon: Malen Gauze, MD;  Location: [facility] ORS;  Service: Urology;  Laterality: N/A;   Patient Active Problem List   Diagnosis Date Noted   Acute embolism and thrombosis of unspecified deep veins of unspecified lower extremity (HCC) 05/31/2021   Shoulder joint pain 05/31/2021   Abnormal gait 02/24/2021   Acute deep vein thrombosis (DVT) of calf muscle vein of left lower extremity (HCC) 02/24/2021   Acute pulmonary embolism (HCC) 02/24/2021   Hemorrhage of colon due to diverticulosis 02/24/2021   History of DVT (deep vein thrombosis) 02/24/2021   Malnutrition of mild degree Lily Kocher: 75% to less than 90% of standard weight) (HCC) 02/24/2021   Personal history of pulmonary embolism 02/24/2021   Polymyalgia rheumatica (HCC) 02/24/2021   Presence of other vascular implants and grafts 02/24/2021   Urinary hesitancy 02/24/2021   Vitamin B deficiency 02/24/2021   Hypomagnesemia 05/11/2020   Atrial fibrillation with RVR (HCC) 05/11/2020   GI bleed 05/05/2020   AKI (acute kidney injury) (HCC)    2019 novel coronavirus disease (COVID-19) 03/07/2020  Anemia 03/07/2020   Arthropathy 03/07/2020   Body mass index (BMI) 32.0-32.9, adult 03/07/2020   Body mass index (BMI) 34.0-34.9, adult 03/07/2020   Chronic kidney disease (CKD) stage G3a/A1, moderately decreased glomerular filtration rate (GFR) between 45-59 mL/min/1.73 square meter and albuminuria creatinine ratio less than 30 mg/g (HCC) 03/07/2020   Chronic sinusitis 03/07/2020   Constipation 03/07/2020   Diabetic renal disease ([place]) 03/07/2020   Hardening of the aorta (main artery of the heart) ([place]) 03/07/2020   Hyperlipidemia 03/07/2020    Microscopic hematuria 03/07/2020   Nausea 03/07/2020   Other abnormalities of gait and mobility 03/07/2020   Personal history of colonic polyps 03/07/2020   Pneumonia due to SARS-associated coronavirus 03/07/2020   Primary open-angle glaucoma, right eye, mild stage 03/07/2020   Degeneration of lumbar intervertebral disc 03/07/2020   Radiculopathy due to lumbar intervertebral disc disorder 03/07/2020   Right lower quadrant pain 03/07/2020   S/P TURP (status post transurethral resection of prostate) 03/07/2020   Secondary hyperparathyroidism of renal origin ([place]) 03/07/2020   Senile purpura ([place]) 03/07/2020   Sequelae of other specified infectious and parasitic diseases 03/07/2020   Stricture of artery ([place]) 03/07/2020   Subclinical hypothyroidism 03/07/2020   History of COVID-19 04/11/2019   Shortness of breath 04/11/2019   Irregular heart rhythm 04/11/2019   Fatigue 04/11/2019   Generalized weakness 04/11/2019   Acute respiratory failure with hypoxia ([place]) 02/02/2019   Essential hypertension 02/02/2019   Type 2 diabetes mellitus with complication, without long-term current use of insulin ([place]) 02/02/2019   Hypokalemia 02/02/2019   BPH (benign prostatic hyperplasia) 12/26/2014    PCP: Jonathon Jordan, MD  REFERRING PROVIDER: Saintclair Halsted, FNP  REFERRING DIAG: M54.41 Acute right sided low back pain with right-sided sciatica R26.9 Abnormal Gait  Rationale for Evaluation and Treatment: Rehabilitation  THERAPY DIAG:  Other low back pain  Other abnormalities of gait and mobility  Muscle weakness (generalized)  Difficulty in walking, not elsewhere classified  ONSET DATE: November 2023  SUBJECTIVE:                                                                                                                                                                                           SUBJECTIVE STATEMENT: Pt states that he had some pain over the weekend, but denies any pain  this morning.  Pt continues to deny falls.  PERTINENT HISTORY:  Hypertension, chronic kidney disease, diabetes, lower lumbar spondylosis with vacuum disc phenomenon at L5-S1 and bilateral foraminal narrowing, OA, DVT  PAIN:  Are you having pain? Yes: NPRS scale: 0/10 Pain location: low back into right groin Pain description: dull ache  Aggravating factors: bending and getting up Relieving factors: lying supine  PRECAUTIONS: Fall  WEIGHT BEARING RESTRICTIONS: No  FALLS:  Has patient fallen in last 6 months? Yes. Number of falls 1 fall while walking outside around his wood pile  LIVING ENVIRONMENT: Lives with: lives with their spouse Lives in: House/apartment Stairs: Yes: External: 3 steps; bilateral but cannot reach both Has following equipment at home: Single point cane, Environmental consultant - 2 wheeled, Wheelchair (manual), and Grab bars  OCCUPATION: retired  PLOF: Independent and Leisure: watching football, gardening, going to [employer]  PATIENT GOALS: To be able to walk better and have better balance.  NEXT MD VISIT: as needed, apart from yearly scheduled in October 2024  OBJECTIVE:   DIAGNOSTIC FINDINGS:  Cervical CT on 12/29/2020: 1. No acute displaced fracture or traumatic listhesis of the cervical spine. 2. Multilevel degenerative changes of the spine leading to severe C3-C4 and C4-C5 left osseous neural foraminal stenosis.  PATIENT SURVEYS:  12/24/2021:  FOTO 45% (Projected 63% by visit 13 02/05/2022:  FOTO 63%  SCREENING FOR RED FLAGS: Bowel or bladder incontinence: No Spinal tumors: No Cauda equina syndrome: No Compression fracture: No Abdominal aneurysm: No  COGNITION: Overall cognitive status: Within functional limits for tasks assessed     SENSATION: WFL  MUSCLE LENGTH: Hamstrings: Tightness bilaterally  POSTURE: rounded shoulders and forward head  PALPATION: Some tight lumbar paraspinals, but no tenderness  LUMBAR ROM:   Limited 25-50% with minimal pain  with lumbar flexion  LOWER EXTREMITY ROM:     WFL  LOWER EXTREMITY MMT:    12/24/2021: Right hamstring strength of 4/5, Left is WFL Right quad strength of 4/5, left is WFL Right hip strength of 4-/5, Left hip strength of 4/5  LUMBAR SPECIAL TESTS:  Slump test: Positive  FUNCTIONAL TESTS:   12/24/2021: 5 times sit to stand: 22.3 sec with hands pushing up from chair rails Timed Up and Go (TUG):  23.1 sec  12/29/2021: 3 Minute Walk Test:  308 ft without assistive device (denies pain)  01/25/2022: 5 times sit to stand: 17.3 sec with hands pushing up from chair rails Timed Up and Go (TUG):  19.2 sec  GAIT: Distance walked: 50 Assistive device utilized: None Level of assistance: SBA Comments: Slow gait velocity with shuffling gait pattern  TODAY'S TREATMENT:                                                                                                                               DATE: 02/05/2022 Nustep level 5 x7 min with PT present to discuss status FOTO 63% Seated with 3#:  heel/toe raises, marching, LAQ, hip ER.  2x10 bilat each Ambulation around PT gyms with green tband and 3# on bilat ankles x1 lap Hip adduction ball squeeze 2x10 Seated shoulder ER and horizontal abduction with green tband 2x10 each Standing hamstring stretch 2x20 sec bilat   DATE: 02/01/2022 Nustep level 5 x6 min with PT present to discuss  status Seated with 2#:  heel/toe raises, marching, LAQ.  2x10 bilat Hip adduction ball squeeze 2x10 Hip abduction with green tband 2x10 Seated hamstring curls with green tband 2x10 bilat Sit to/from stand 2x10 Standing shoulder ER and horizontal abduction with red tband 2x10 each Alt LE toe taps to 6" step 2x10 FWD 6" step ups with 6" step and UE use x10 bilat Standing hamstring stretch 2x20 sec bilat   DATE: 01/25/2022 Nustep level 4 x6 min with PT present to discuss status Seated with 2#:  heel/toe raises, marching, LAQ.  2x10 bilat Hip adduction ball  squeeze 2x10 Hip abduction with green tband 2x10 Seated hamstring curls with green tband 2x10 bilat 5 times sit to stand, TUG Seated shoulder ER and horizontal abduction with red tband 2x10 each Alt LE toe taps to 6" step 2x10 FWD 6" step ups with 6" step and UE use x10 bilat Seated passive hamstring stretch (with PT assist) 2x20 sec bilat    PATIENT EDUCATION:  Education details: Issued HEP Person educated: Patient Education method: Explanation, Demonstration, and Handouts Education comprehension: verbalized understanding and returned demonstration  HOME EXERCISE PROGRAM: Access Code: [email or web address] URL: [email or web address] Date: 12/29/2021 Prepared by: Shelby Dubin Leniya Breit  Exercises - Seated Hamstring Stretch  - 1 x daily - 7 x weekly - 1 sets - 2 reps - 20 sec hold - Seated Long Arc Quad  - 1 x daily - 7 x weekly - 2 sets - 10 reps - Seated March  - 1 x daily - 7 x weekly - 2 sets - 10 reps - Sit to Stand  - 1 x daily - 7 x weekly - 2 sets - 5 reps - Seated Hip Abduction with Resistance  - 1 x daily - 7 x weekly - 2 sets - 10 reps - Shoulder External Rotation and Scapular Retraction with Resistance  - 1 x daily - 7 x weekly - 2 sets - 10 reps - Standing Shoulder Horizontal Abduction with Resistance  - 1 x daily - 7 x weekly - 2 sets - 10 reps  ASSESSMENT:  CLINICAL IMPRESSION: Mr Whittley presents to skilled PT reporting no new complaints or falls.  Patient has met projected FOTO score at this time with less pain reported.  Patient progressed with ability to ambulate with 3# around ankles without any loss of balance.  Patient did require some cuing for increased step length and increased foot clearance.  Patient continues to require skilled PT to progress towards goal related activities, but is on track for discharge at end of cert period.   OBJECTIVE IMPAIRMENTS: decreased activity tolerance, decreased balance, difficulty walking, decreased strength, increased muscle  spasms, impaired flexibility, postural dysfunction, and pain.   ACTIVITY LIMITATIONS: lifting, bending, standing, and squatting  PARTICIPATION LIMITATIONS: cleaning, community activity, and yard work  PERSONAL FACTORS: Age, Past/current experiences, and 3+ comorbidities: OA, HTN, weakness since COVID in 2021, DM  are also affecting patient's functional outcome.   REHAB POTENTIAL: Good  CLINICAL DECISION MAKING: Evolving/moderate complexity  EVALUATION COMPLEXITY: Moderate   GOALS: Goals reviewed with patient? Yes  SHORT TERM GOALS: Target date: 01/14/2021  Pt to be independent with initial HEP. Baseline: Goal status: MET  2.  Pt able to complete a 3 minute walk test. Baseline:  Goal status: MET on 12/29/2021   LONG TERM GOALS: Target date: 02/19/2022  Pt will be independent with advanced HEP. Baseline:  Goal status: IN PROGRESS  2.  Patient to improve time with TUG to 15 seconds  or less to decrease his risk of falling. Baseline: 23.1 sec Goal status: IN PROGRESS (see above)  3.  Pt will increase BLE strength to at least 4+/5 to allow him to perform functional tasks with increased ease. Baseline:  Goal status: INITIAL  4.  Patient to report ability to pick up wood from the pile and place in his wood stove without increased pain. Baseline:  Goal status: IN PROGRESS  5.  Patient to deny any falls or losses of balance within the last week of treatment with noted improved foot clearance during PT session. Baseline:  Goal status: IN PROGRESS  PLAN:  PT FREQUENCY: 2x/week  PT DURATION: 8 weeks  PLANNED INTERVENTIONS: Therapeutic exercises, Therapeutic activity, Neuromuscular re-education, Balance training, Gait training, Patient/Family education, Self Care, Joint mobilization, Joint manipulation, Stair training, Aquatic Therapy, Dry Needling, Electrical stimulation, Spinal manipulation, Spinal mobilization, Cryotherapy, Moist heat, Taping, Traction, Ultrasound,  Ionotophoresis 4mg /ml Dexamethasone, Manual therapy, and Re-evaluation.  PLAN FOR NEXT SESSION: assess and progress HEP as indicated, strengthening, balance   , PT 02/05/2022, 10:16 AM   [facility] [address], [address] [place], [place] Phone # [phone] Fax [phone]

## 2022-02-08 ENCOUNTER — Encounter: Payer: Self-pay | Admitting: Rehabilitative and Restorative Service Providers"

## 2022-02-08 ENCOUNTER — Ambulatory Visit: Payer: Medicare Other | Admitting: Rehabilitative and Restorative Service Providers"

## 2022-02-08 DIAGNOSIS — M5459 Other low back pain: Secondary | ICD-10-CM | POA: Diagnosis not present

## 2022-02-08 DIAGNOSIS — R262 Difficulty in walking, not elsewhere classified: Secondary | ICD-10-CM | POA: Diagnosis not present

## 2022-02-08 DIAGNOSIS — R2689 Other abnormalities of gait and mobility: Secondary | ICD-10-CM

## 2022-02-08 DIAGNOSIS — M6281 Muscle weakness (generalized): Secondary | ICD-10-CM | POA: Diagnosis not present

## 2022-02-08 NOTE — Therapy (Signed)
OUTPATIENT PHYSICAL THERAPY TREATMENT NOTE   Patient Name: Thomas Joseph MRN: [id number] DOB:[birth year], 89 y.o., male Today's Date: 02/08/2022  END OF SESSION:  PT End of Session - 02/08/22 0923     Visit Number 7    Date for PT Re-Evaluation 02/19/22    Authorization Type [employer]    Progress Note Due on Visit 10    PT Start Time 0922    PT Stop Time 1000    PT Time Calculation (min) 38 min    Activity Tolerance Patient tolerated treatment well    Behavior During Therapy WFL for tasks assessed/performed             Past Medical History:  Diagnosis Date   Anemia    BPH (benign prostatic hyperplasia)    Chronic kidney disease (CKD) stage G3a/A1, moderately decreased glomerular filtration rate (GFR) between 45-59 mL/min/1.73 square meter and albuminuria creatinine ratio less than 30 mg/g (HCC) 03/07/2020   Diabetes mellitus without complication [facility])    ED (erectile dysfunction)    Family history of adverse reaction to anesthesia    son had some problem years ago, pt unsure of what exact problem was   Hypertension    Obesity    Pneumonia    Past Surgical History:  Procedure Laterality Date    LEFT KNEE ARTHROCOPY     COLONOSCOPY WITH PROPOFOL Left 05/07/2020   Procedure: COLONOSCOPY WITH PROPOFOL;  Surgeon: Kathi Der, MD;  Location: [facility] ENDOSCOPY;  Service: Gastroenterology;  Laterality: Left;   COLONOSCOPY WITH PROPOFOL N/A 05/11/2020   Procedure: COLONOSCOPY WITH PROPOFOL;  Surgeon: Charlott Rakes, MD;  Location: [facility] ENDOSCOPY;  Service: Endoscopy;  Laterality: N/A;   ESOPHAGOGASTRODUODENOSCOPY N/A 05/06/2020   Procedure: ESOPHAGOGASTRODUODENOSCOPY (EGD);  Surgeon: Willis Modena, MD;  Location: [facility] ENDOSCOPY;  Service: Endoscopy;  Laterality: N/A;   HERNIA REPAIR     IR ANGIOGRAM SELECTIVE EACH ADDITIONAL VESSEL  05/11/2020   IR ANGIOGRAM VISCERAL SELECTIVE  05/11/2020   IR EMBO ART  VEN HEMORR LYMPH EXTRAV  INC GUIDE ROADMAPPING  05/11/2020   IR IVC FILTER  PLMT / S&I /IMG GUID/MOD SED  05/11/2020   IR RADIOLOGIST EVAL & MGMT  08/27/2020   IR RADIOLOGIST EVAL & MGMT  02/26/2021   IR US GUIDE VASC ACCESS RIGHT  05/11/2020   IR VENOGRAM RENAL BI  05/11/2020   TRANSURETHRAL RESECTION OF PROSTATE N/A 12/26/2014   Procedure: TRANSURETHRAL RESECTION OF THE PROSTATE;  Surgeon: Malen Gauze, MD;  Location: [facility] ORS;  Service: Urology;  Laterality: N/A;   Patient Active Problem List   Diagnosis Date Noted   Acute embolism and thrombosis of unspecified deep veins of unspecified lower extremity (HCC) 05/31/2021   Shoulder joint pain 05/31/2021   Abnormal gait 02/24/2021   Acute deep vein thrombosis (DVT) of calf muscle vein of left lower extremity (HCC) 02/24/2021   Acute pulmonary embolism (HCC) 02/24/2021   Hemorrhage of colon due to diverticulosis 02/24/2021   History of DVT (deep vein thrombosis) 02/24/2021   Malnutrition of mild degree Lily Kocher: 75% to less than 90% of standard weight) (HCC) 02/24/2021   Personal history of pulmonary embolism 02/24/2021   Polymyalgia rheumatica (HCC) 02/24/2021   Presence of other vascular implants and grafts 02/24/2021   Urinary hesitancy 02/24/2021   Vitamin B deficiency 02/24/2021   Hypomagnesemia 05/11/2020   Atrial fibrillation with RVR (HCC) 05/11/2020   GI bleed 05/05/2020   AKI (acute kidney injury) (HCC)    2019 novel coronavirus disease (COVID-19) 03/07/2020  Anemia 03/07/2020   Arthropathy 03/07/2020   Body mass index (BMI) 32.0-32.9, adult 03/07/2020   Body mass index (BMI) 34.0-34.9, adult 03/07/2020   Chronic kidney disease (CKD) stage G3a/A1, moderately decreased glomerular filtration rate (GFR) between 45-59 mL/min/1.73 square meter and albuminuria creatinine ratio less than 30 mg/g (HCC) 03/07/2020   Chronic sinusitis 03/07/2020   Constipation 03/07/2020   Diabetic renal disease ([place]) 03/07/2020   Hardening of the aorta (main artery of the heart) ([place]) 03/07/2020   Hyperlipidemia 03/07/2020    Microscopic hematuria 03/07/2020   Nausea 03/07/2020   Other abnormalities of gait and mobility 03/07/2020   Personal history of colonic polyps 03/07/2020   Pneumonia due to SARS-associated coronavirus 03/07/2020   Primary open-angle glaucoma, right eye, mild stage 03/07/2020   Degeneration of lumbar intervertebral disc 03/07/2020   Radiculopathy due to lumbar intervertebral disc disorder 03/07/2020   Right lower quadrant pain 03/07/2020   S/P TURP (status post transurethral resection of prostate) 03/07/2020   Secondary hyperparathyroidism of renal origin ([place]) 03/07/2020   Senile purpura ([place]) 03/07/2020   Sequelae of other specified infectious and parasitic diseases 03/07/2020   Stricture of artery ([place]) 03/07/2020   Subclinical hypothyroidism 03/07/2020   History of COVID-19 04/11/2019   Shortness of breath 04/11/2019   Irregular heart rhythm 04/11/2019   Fatigue 04/11/2019   Generalized weakness 04/11/2019   Acute respiratory failure with hypoxia ([place]) 02/02/2019   Essential hypertension 02/02/2019   Type 2 diabetes mellitus with complication, without long-term current use of insulin ([place]) 02/02/2019   Hypokalemia 02/02/2019   BPH (benign prostatic hyperplasia) 12/26/2014    PCP: Jonathon Jordan, MD  REFERRING PROVIDER: Saintclair Halsted, FNP  REFERRING DIAG: M54.41 Acute right sided low back pain with right-sided sciatica R26.9 Abnormal Gait  Rationale for Evaluation and Treatment: Rehabilitation  THERAPY DIAG:  Other low back pain  Other abnormalities of gait and mobility  Muscle weakness (generalized)  Difficulty in walking, not elsewhere classified  ONSET DATE: November 2023  SUBJECTIVE:                                                                                                                                                                                           SUBJECTIVE STATEMENT: Pt continues to deny any falls and denies current pain.  States that  he did have some pain yesterday, but believes it may have been due to inactivity.  PERTINENT HISTORY:  Hypertension, chronic kidney disease, diabetes, lower lumbar spondylosis with vacuum disc phenomenon at L5-S1 and bilateral foraminal narrowing, OA, DVT  PAIN:  Are you having pain? Yes: NPRS scale: 0/10 Pain location: low back into  right groin Pain description: dull ache Aggravating factors: bending and getting up Relieving factors: lying supine  PRECAUTIONS: Fall  WEIGHT BEARING RESTRICTIONS: No  FALLS:  Has patient fallen in last 6 months? Yes. Number of falls 1 fall while walking outside around his wood pile  LIVING ENVIRONMENT: Lives with: lives with their spouse Lives in: House/apartment Stairs: Yes: External: 3 steps; bilateral but cannot reach both Has following equipment at home: Single point cane, Environmental consultant - 2 wheeled, Wheelchair (manual), and Grab bars  OCCUPATION: retired  PLOF: Independent and Leisure: watching football, gardening, going to [employer]  PATIENT GOALS: To be able to walk better and have better balance.  NEXT MD VISIT: as needed, apart from yearly scheduled in October 2024  OBJECTIVE:   DIAGNOSTIC FINDINGS:  Cervical CT on 12/29/2020: 1. No acute displaced fracture or traumatic listhesis of the cervical spine. 2. Multilevel degenerative changes of the spine leading to severe C3-C4 and C4-C5 left osseous neural foraminal stenosis.  PATIENT SURVEYS:  12/24/2021:  FOTO 45% (Projected 63% by visit 13 02/05/2022:  FOTO 63%  SCREENING FOR RED FLAGS: Bowel or bladder incontinence: No Spinal tumors: No Cauda equina syndrome: No Compression fracture: No Abdominal aneurysm: No  COGNITION: Overall cognitive status: Within functional limits for tasks assessed     SENSATION: WFL  MUSCLE LENGTH: Hamstrings: Tightness bilaterally  POSTURE: rounded shoulders and forward head  PALPATION: Some tight lumbar paraspinals, but no tenderness  LUMBAR ROM:    Limited 25-50% with minimal pain with lumbar flexion  LOWER EXTREMITY ROM:     WFL  LOWER EXTREMITY MMT:    12/24/2021: Right hamstring strength of 4/5, Left is WFL Right quad strength of 4/5, left is WFL Right hip strength of 4-/5, Left hip strength of 4/5  LUMBAR SPECIAL TESTS:  Slump test: Positive  FUNCTIONAL TESTS:   12/24/2021: 5 times sit to stand: 22.3 sec with hands pushing up from chair rails Timed Up and Go (TUG):  23.1 sec  12/29/2021: 3 Minute Walk Test:  308 ft without assistive device (denies pain)  01/25/2022: 5 times sit to stand: 17.3 sec with hands pushing up from chair rails Timed Up and Go (TUG):  19.2 sec  GAIT: Distance walked: 50 Assistive device utilized: None Level of assistance: SBA Comments: Slow gait velocity with shuffling gait pattern  TODAY'S TREATMENT:                                                                                                                               DATE: 02/08/2022 Nustep level 5 x7 min with PT present to discuss status Seated with 4#:  heel/toe raises, marching, LAQ, hip ER.  2x10 bilat each Ambulation around PT gyms with 4# on bilat ankles x2 laps with seated recovery period between each lap Side stepping with wall for balance, as needed 2x10 ft bilat Alt LE toe taps to 6" step 2x10 Standing hamstring stretch 2x20 sec bilat Standing lat pull  40# x10   DATE: 02/05/2022 Nustep level 5 x7 min with PT present to discuss status FOTO 63% Seated with 3#:  heel/toe raises, marching, LAQ, hip ER.  2x10 bilat each Ambulation around PT gyms with 3# on bilat ankles x1 lap Hip adduction ball squeeze 2x10 Seated shoulder ER and horizontal abduction with green tband 2x10 each Standing hamstring stretch 2x20 sec bilat   DATE: 02/01/2022 Nustep level 5 x6 min with PT present to discuss status Seated with 2#:  heel/toe raises, marching, LAQ.  2x10 bilat Hip adduction ball squeeze 2x10 Hip abduction with green  tband 2x10 Seated hamstring curls with green tband 2x10 bilat Sit to/from stand 2x10 Standing shoulder ER and horizontal abduction with red tband 2x10 each Alt LE toe taps to 6" step 2x10 FWD 6" step ups with 6" step and UE use x10 bilat Standing hamstring stretch 2x20 sec bilat    PATIENT EDUCATION:  Education details: Issued HEP Person educated: Patient Education method: Explanation, Demonstration, and Handouts Education comprehension: verbalized understanding and returned demonstration  HOME EXERCISE PROGRAM: Access Code: [email or web address] URL: [email or web address] Date: 12/29/2021 Prepared by: Clydie Braun Anitra Doxtater  Exercises - Seated Hamstring Stretch  - 1 x daily - 7 x weekly - 1 sets - 2 reps - 20 sec hold - Seated Long Arc Quad  - 1 x daily - 7 x weekly - 2 sets - 10 reps - Seated March  - 1 x daily - 7 x weekly - 2 sets - 10 reps - Sit to Stand  - 1 x daily - 7 x weekly - 2 sets - 5 reps - Seated Hip Abduction with Resistance  - 1 x daily - 7 x weekly - 2 sets - 10 reps - Shoulder External Rotation and Scapular Retraction with Resistance  - 1 x daily - 7 x weekly - 2 sets - 10 reps - Standing Shoulder Horizontal Abduction with Resistance  - 1 x daily - 7 x weekly - 2 sets - 10 reps  ASSESSMENT:  CLINICAL IMPRESSION: Thomas Joseph presents to skilled PT reporting that he has noticed that he feels better on days where he is more active.  Patient able to progress with increased weights and add in standing lat pull.  Patient progresses with increased ambulation distance with weights on ankles.  Patient without a loss of balance throughout, but does continue to require cuing for increased step length during ambulation.     OBJECTIVE IMPAIRMENTS: decreased activity tolerance, decreased balance, difficulty walking, decreased strength, increased muscle spasms, impaired flexibility, postural dysfunction, and pain.   ACTIVITY LIMITATIONS: lifting, bending, standing, and  squatting  PARTICIPATION LIMITATIONS: cleaning, community activity, and yard work  PERSONAL FACTORS: Age, Past/current experiences, and 3+ comorbidities: OA, HTN, weakness since COVID in 2021, DM  are also affecting patient's functional outcome.   REHAB POTENTIAL: Good  CLINICAL DECISION MAKING: Evolving/moderate complexity  EVALUATION COMPLEXITY: Moderate   GOALS: Goals reviewed with patient? Yes  SHORT TERM GOALS: Target date: 01/14/2021  Pt to be independent with initial HEP. Baseline: Goal status: MET  2.  Pt able to complete a 3 minute walk test. Baseline:  Goal status: MET on 12/29/2021   LONG TERM GOALS: Target date: 02/19/2022  Pt will be independent with advanced HEP. Baseline:  Goal status: IN PROGRESS  2.  Patient to improve time with TUG to 15 seconds or less to decrease his risk of falling. Baseline: 23.1 sec Goal status: IN PROGRESS (see above)  3.  Pt will increase  BLE strength to at least 4+/5 to allow him to perform functional tasks with increased ease. Baseline:  Goal status: INITIAL  4.  Patient to report ability to pick up wood from the pile and place in his wood stove without increased pain. Baseline:  Goal status: IN PROGRESS  5.  Patient to deny any falls or losses of balance within the last week of treatment with noted improved foot clearance during PT session. Baseline:  Goal status: IN PROGRESS  PLAN:  PT FREQUENCY: 2x/week  PT DURATION: 8 weeks  PLANNED INTERVENTIONS: Therapeutic exercises, Therapeutic activity, Neuromuscular re-education, Balance training, Gait training, Patient/Family education, Self Care, Joint mobilization, Joint manipulation, Stair training, Aquatic Therapy, Dry Needling, Electrical stimulation, Spinal manipulation, Spinal mobilization, Cryotherapy, Moist heat, Taping, Traction, Ultrasound, Ionotophoresis 4mg /ml Dexamethasone, Manual therapy, and Re-evaluation.  PLAN FOR NEXT SESSION: assess and progress HEP as  indicated, strengthening, balance   Juel Burrow, PT 02/08/2022, 10:08 AM   [facility] [address], [place],  [address] Phone # [phone] Fax [phone]

## 2022-02-12 ENCOUNTER — Ambulatory Visit: Payer: Medicare Other | Attending: Family Medicine | Admitting: Rehabilitative and Restorative Service Providers"

## 2022-02-12 ENCOUNTER — Encounter: Payer: Self-pay | Admitting: Rehabilitative and Restorative Service Providers"

## 2022-02-12 DIAGNOSIS — M5459 Other low back pain: Secondary | ICD-10-CM

## 2022-02-12 DIAGNOSIS — R2689 Other abnormalities of gait and mobility: Secondary | ICD-10-CM

## 2022-02-12 DIAGNOSIS — R262 Difficulty in walking, not elsewhere classified: Secondary | ICD-10-CM

## 2022-02-12 DIAGNOSIS — M6281 Muscle weakness (generalized): Secondary | ICD-10-CM | POA: Diagnosis not present

## 2022-02-12 NOTE — Therapy (Signed)
OUTPATIENT PHYSICAL THERAPY TREATMENT NOTE   Patient Name: Thomas Joseph MRN: [id number] DOB:1933-09-05, 89 y.o., male Today's Date: 02/12/2022  END OF SESSION:  PT End of Session - 02/12/22 0934     Visit Number 8    Date for PT Re-Evaluation 02/19/22    Authorization Type [employer] Medicare    Progress Note Due on Visit 10    PT Start Time 0930    PT Stop Time 1010    PT Time Calculation (min) 40 min    Activity Tolerance Patient tolerated treatment well    Behavior During Therapy WFL for tasks assessed/performed             Past Medical History:  Diagnosis Date   Anemia    BPH (benign prostatic hyperplasia)    Chronic kidney disease (CKD) stage G3a/A1, moderately decreased glomerular filtration rate (GFR) between 45-59 mL/min/1.73 square meter and albuminuria creatinine ratio less than 30 mg/g ([place]) 03/07/2020   Diabetes mellitus without complication [facility])    ED (erectile dysfunction)    Family history of adverse reaction to anesthesia    son had some problem years ago, pt unsure of what exact problem was   Hypertension    Obesity    Pneumonia    Past Surgical History:  Procedure Laterality Date    LEFT KNEE ARTHROCOPY     COLONOSCOPY WITH PROPOFOL Left 05/07/2020   Procedure: COLONOSCOPY WITH PROPOFOL;  Surgeon: Otis Brace, MD;  Location: [facility];  Service: Gastroenterology;  Laterality: Left;   COLONOSCOPY WITH PROPOFOL N/A 05/11/2020   Procedure: COLONOSCOPY WITH PROPOFOL;  Surgeon: Wilford Corner, MD;  Location: [facility];  Service: Endoscopy;  Laterality: N/A;   ESOPHAGOGASTRODUODENOSCOPY N/A 05/06/2020   Procedure: ESOPHAGOGASTRODUODENOSCOPY (EGD);  Surgeon: Arta Silence, MD;  Location: [facility];  Service: Endoscopy;  Laterality: N/A;   HERNIA REPAIR     IR ANGIOGRAM SELECTIVE EACH ADDITIONAL VESSEL  05/11/2020   IR ANGIOGRAM VISCERAL SELECTIVE  05/11/2020   IR EMBO ART  VEN HEMORR LYMPH EXTRAV  INC GUIDE ROADMAPPING  05/11/2020   IR IVC FILTER  PLMT / S&I /IMG GUID/MOD SED  05/11/2020   IR RADIOLOGIST EVAL & MGMT  08/27/2020   IR RADIOLOGIST EVAL & MGMT  02/26/2021   IR US GUIDE VASC ACCESS RIGHT  05/11/2020   IR VENOGRAM RENAL BI  05/11/2020   TRANSURETHRAL RESECTION OF PROSTATE N/A 12/26/2014   Procedure: TRANSURETHRAL RESECTION OF THE PROSTATE;  Surgeon: Cleon Gustin, MD;  Location: [facility];  Service: Urology;  Laterality: N/A;   Patient Active Problem List   Diagnosis Date Noted   Acute embolism and thrombosis of unspecified deep veins of unspecified lower extremity ([place]) 05/31/2021   Shoulder joint pain 05/31/2021   Abnormal gait 02/24/2021   Acute deep vein thrombosis (DVT) of calf muscle vein of left lower extremity ([place]) 02/24/2021   Acute pulmonary embolism ([place]) 02/24/2021   Hemorrhage of colon due to diverticulosis 02/24/2021   History of DVT (deep vein thrombosis) 02/24/2021   Malnutrition of mild degree [place]: 75% to less than 90% of standard weight) ([place]) 02/24/2021   Personal history of pulmonary embolism 02/24/2021   Polymyalgia rheumatica ([place]) 02/24/2021   Presence of other vascular implants and grafts 02/24/2021   Urinary hesitancy 02/24/2021   Vitamin B deficiency 02/24/2021   Hypomagnesemia 05/11/2020   Atrial fibrillation with RVR ([place]) 05/11/2020   GI bleed 05/05/2020   AKI (acute kidney injury) ([place])    2019 novel coronavirus disease (COVID-19) 03/07/2020  Anemia 03/07/2020   Arthropathy 03/07/2020   Body mass index (BMI) 32.0-32.9, adult 03/07/2020   Body mass index (BMI) 34.0-34.9, adult 03/07/2020   Chronic kidney disease (CKD) stage G3a/A1, moderately decreased glomerular filtration rate (GFR) between 45-59 mL/min/1.73 square meter and albuminuria creatinine ratio less than 30 mg/g ([facility]) 03/07/2020   Chronic sinusitis 03/07/2020   Constipation 03/07/2020   Diabetic renal disease ([place]) 03/07/2020   Hardening of the aorta (main artery of the heart) ([place]) 03/07/2020   Hyperlipidemia 03/07/2020    Microscopic hematuria 03/07/2020   Nausea 03/07/2020   Other abnormalities of gait and mobility 03/07/2020   Personal history of colonic polyps 03/07/2020   Pneumonia due to SARS-associated coronavirus 03/07/2020   Primary open-angle glaucoma, right eye, mild stage 03/07/2020   Degeneration of lumbar intervertebral disc 03/07/2020   Radiculopathy due to lumbar intervertebral disc disorder 03/07/2020   Right lower quadrant pain 03/07/2020   S/P TURP (status post transurethral resection of prostate) 03/07/2020   Secondary hyperparathyroidism of renal origin ([place]) 03/07/2020   Senile purpura ([place]) 03/07/2020   Sequelae of other specified infectious and parasitic diseases 03/07/2020   Stricture of artery ([place]) 03/07/2020   Subclinical hypothyroidism 03/07/2020   History of COVID-19 04/11/2019   Shortness of breath 04/11/2019   Irregular heart rhythm 04/11/2019   Fatigue 04/11/2019   Generalized weakness 04/11/2019   Acute respiratory failure with hypoxia ([place]) 02/02/2019   Essential hypertension 02/02/2019   Type 2 diabetes mellitus with complication, without long-term current use of insulin ([place]) 02/02/2019   Hypokalemia 02/02/2019   BPH (benign prostatic hyperplasia) 12/26/2014    PCP: Jonathon Jordan, MD  REFERRING PROVIDER: Saintclair Halsted, FNP  REFERRING DIAG: M54.41 Acute right sided low back pain with right-sided sciatica R26.9 Abnormal Gait  Rationale for Evaluation and Treatment: Rehabilitation  THERAPY DIAG:  Other low back pain  Other abnormalities of gait and mobility  Muscle weakness (generalized)  Difficulty in walking, not elsewhere classified  ONSET DATE: November 2023  SUBJECTIVE:                                                                                                                                                                                           SUBJECTIVE STATEMENT: Pt states that he is feeling much better than he did at initial  evaluation.  Pt reports that he is planning to do some yard work and planting while the weather is nicer.  PERTINENT HISTORY:  Hypertension, chronic kidney disease, diabetes, lower lumbar spondylosis with vacuum disc phenomenon at L5-S1 and bilateral foraminal narrowing, OA, DVT  PAIN:  Are you having pain? Yes: NPRS scale: 0/10  Pain location: low back into right groin Pain description: dull ache Aggravating factors: bending and getting up Relieving factors: lying supine  PRECAUTIONS: Fall  WEIGHT BEARING RESTRICTIONS: No  FALLS:  Has patient fallen in last 6 months? Yes. Number of falls 1 fall while walking outside around his wood pile  LIVING ENVIRONMENT: Lives with: lives with their spouse Lives in: House/apartment Stairs: Yes: External: 3 steps; bilateral but cannot reach both Has following equipment at home: Single point cane, Environmental consultant - 2 wheeled, Wheelchair (manual), and Grab bars  OCCUPATION: retired  PLOF: Independent and Leisure: watching football, gardening, going to [employer]  PATIENT GOALS: To be able to walk better and have better balance.  NEXT MD VISIT: as needed, apart from yearly scheduled in October 2024  OBJECTIVE:   DIAGNOSTIC FINDINGS:  Cervical CT on 12/29/2020: 1. No acute displaced fracture or traumatic listhesis of the cervical spine. 2. Multilevel degenerative changes of the spine leading to severe C3-C4 and C4-C5 left osseous neural foraminal stenosis.  PATIENT SURVEYS:  12/24/2021:  FOTO 45% (Projected 63% by visit 13 02/05/2022:  FOTO 63%  SCREENING FOR RED FLAGS: Bowel or bladder incontinence: No Spinal tumors: No Cauda equina syndrome: No Compression fracture: No Abdominal aneurysm: No  COGNITION: Overall cognitive status: Within functional limits for tasks assessed     SENSATION: WFL  MUSCLE LENGTH: Hamstrings: Tightness bilaterally  POSTURE: rounded shoulders and forward head  PALPATION: Some tight lumbar paraspinals, but no  tenderness  LUMBAR ROM:   Limited 25-50% with minimal pain with lumbar flexion  LOWER EXTREMITY ROM:     WFL  LOWER EXTREMITY MMT:    12/24/2021: Right hamstring strength of 4/5, Left is WFL Right quad strength of 4/5, left is WFL Right hip strength of 4-/5, Left hip strength of 4/5  LUMBAR SPECIAL TESTS:  Slump test: Positive  FUNCTIONAL TESTS:   12/24/2021: 5 times sit to stand: 22.3 sec with hands pushing up from chair rails Timed Up and Go (TUG):  23.1 sec  12/29/2021: 3 Minute Walk Test:  308 ft without assistive device (denies pain)  01/25/2022: 5 times sit to stand: 17.3 sec with hands pushing up from chair rails Timed Up and Go (TUG):  19.2 sec  02/12/2022: 3 Minute Walk Test:  313 ft without assistive device (denies pain)  GAIT: Distance walked: 50 Assistive device utilized: None Level of assistance: SBA Comments: Slow gait velocity with shuffling gait pattern  TODAY'S TREATMENT:                                                                                                                               DATE: 02/12/2022 Nustep level 5 x7 min with PT present to discuss status Seated with 4#:  heel/toe raises, marching, LAQ, hip ER.  2x10 bilat each 3 min amb around both PT gyms Side stepping with wall for balance, as needed 3x10 ft bilat Hip abduction with green tband 2x10 Seated hamstring curls  with green tband 2x10 bilat Seated shoulder ER and horizontal abduction with green tband 2x10 each   DATE: 02/08/2022 Nustep level 5 x7 min with PT present to discuss status Seated with 4#:  heel/toe raises, marching, LAQ, hip ER.  2x10 bilat each Ambulation around PT gyms with 4# on bilat ankles x2 laps with seated recovery period between each lap Side stepping with wall for balance, as needed 2x10 ft bilat Alt LE toe taps to 6" step 2x10 Standing hamstring stretch 2x20 sec bilat Standing lat pull 40# x10   DATE: 02/05/2022 Nustep level 5 x7 min with PT  present to discuss status FOTO 63% Seated with 3#:  heel/toe raises, marching, LAQ, hip ER.  2x10 bilat each Ambulation around PT gyms with 3# on bilat ankles x1 lap Hip adduction ball squeeze 2x10 Seated shoulder ER and horizontal abduction with green tband 2x10 each Standing hamstring stretch 2x20 sec bilat    PATIENT EDUCATION:  Education details: Issued HEP Person educated: Patient Education method: Explanation, Demonstration, and Handouts Education comprehension: verbalized understanding and returned demonstration  HOME EXERCISE PROGRAM: Access Code: [email or web address] URL: [email or web address] Date: 12/29/2021 Prepared by: Clydie Braun Gregg Holster  Exercises - Seated Hamstring Stretch  - 1 x daily - 7 x weekly - 1 sets - 2 reps - 20 sec hold - Seated Long Arc Quad  - 1 x daily - 7 x weekly - 2 sets - 10 reps - Seated March  - 1 x daily - 7 x weekly - 2 sets - 10 reps - Sit to Stand  - 1 x daily - 7 x weekly - 2 sets - 5 reps - Seated Hip Abduction with Resistance  - 1 x daily - 7 x weekly - 2 sets - 10 reps - Shoulder External Rotation and Scapular Retraction with Resistance  - 1 x daily - 7 x weekly - 2 sets - 10 reps - Standing Shoulder Horizontal Abduction with Resistance  - 1 x daily - 7 x weekly - 2 sets - 10 reps  ASSESSMENT:  CLINICAL IMPRESSION: Mr Oleson presents to skilled PT denying any falls and no back pain.  Patient is progressing with slight improvement noted in 3 minute walk test.  Patient is progressing with increased overall activity tolerance and reporting that he is hoping to do some yard work later today while the weather is nice.  Patient on track for possible discharge next week, as scheduled.   OBJECTIVE IMPAIRMENTS: decreased activity tolerance, decreased balance, difficulty walking, decreased strength, increased muscle spasms, impaired flexibility, postural dysfunction, and pain.   ACTIVITY LIMITATIONS: lifting, bending, standing, and  squatting  PARTICIPATION LIMITATIONS: cleaning, community activity, and yard work  PERSONAL FACTORS: Age, Past/current experiences, and 3+ comorbidities: OA, HTN, weakness since COVID in 2021, DM  are also affecting patient's functional outcome.   REHAB POTENTIAL: Good  CLINICAL DECISION MAKING: Evolving/moderate complexity  EVALUATION COMPLEXITY: Moderate   GOALS: Goals reviewed with patient? Yes  SHORT TERM GOALS: Target date: 01/14/2021  Pt to be independent with initial HEP. Baseline: Goal status: MET  2.  Pt able to complete a 3 minute walk test. Baseline:  Goal status: MET on 12/29/2021   LONG TERM GOALS: Target date: 02/19/2022  Pt will be independent with advanced HEP. Baseline:  Goal status: IN PROGRESS  2.  Patient to improve time with TUG to 15 seconds or less to decrease his risk of falling. Baseline: 23.1 sec Goal status: IN PROGRESS (see above)  3.  Pt will increase  BLE strength to at least 4+/5 to allow him to perform functional tasks with increased ease. Baseline:  Goal status: INITIAL  4.  Patient to report ability to pick up wood from the pile and place in his wood stove without increased pain. Baseline:  Goal status: IN PROGRESS  5.  Patient to deny any falls or losses of balance within the last week of treatment with noted improved foot clearance during PT session. Baseline:  Goal status: IN PROGRESS  PLAN:  PT FREQUENCY: 2x/week  PT DURATION: 8 weeks  PLANNED INTERVENTIONS: Therapeutic exercises, Therapeutic activity, Neuromuscular re-education, Balance training, Gait training, Patient/Family education, Self Care, Joint mobilization, Joint manipulation, Stair training, Aquatic Therapy, Dry Needling, Electrical stimulation, Spinal manipulation, Spinal mobilization, Cryotherapy, Moist heat, Taping, Traction, Ultrasound, Ionotophoresis 4mg /ml Dexamethasone, Manual therapy, and Re-evaluation.  PLAN FOR NEXT SESSION: assess and progress HEP as  indicated, strengthening, balance   Juel Burrow, PT 02/12/2022, 10:14 AM   [facility] [address] [place], [place] [address] Phone # [phone] Fax [phone]

## 2022-02-15 ENCOUNTER — Ambulatory Visit: Payer: Medicare Other | Admitting: Rehabilitative and Restorative Service Providers"

## 2022-02-15 ENCOUNTER — Encounter: Payer: Self-pay | Admitting: Rehabilitative and Restorative Service Providers"

## 2022-02-15 DIAGNOSIS — R2689 Other abnormalities of gait and mobility: Secondary | ICD-10-CM | POA: Diagnosis not present

## 2022-02-15 DIAGNOSIS — R262 Difficulty in walking, not elsewhere classified: Secondary | ICD-10-CM

## 2022-02-15 DIAGNOSIS — M5459 Other low back pain: Secondary | ICD-10-CM

## 2022-02-15 DIAGNOSIS — M6281 Muscle weakness (generalized): Secondary | ICD-10-CM | POA: Diagnosis not present

## 2022-02-15 NOTE — Therapy (Signed)
OUTPATIENT PHYSICAL THERAPY TREATMENT NOTE   Patient Name: Thomas Joseph MRN: [id number] DOB:Jun 02, 1933, 89 y.o., male Today's Date: 02/15/2022  END OF SESSION:  PT End of Session - 02/15/22 0935     Visit Number 9    Date for PT Re-Evaluation 02/19/22    Authorization Type [employer] Medicare    Progress Note Due on Visit 10    PT Start Time 0930    PT Stop Time 1010    PT Time Calculation (min) 40 min    Activity Tolerance Patient tolerated treatment well    Behavior During Therapy WFL for tasks assessed/performed             Past Medical History:  Diagnosis Date   Anemia    BPH (benign prostatic hyperplasia)    Chronic kidney disease (CKD) stage G3a/A1, moderately decreased glomerular filtration rate (GFR) between 45-59 mL/min/1.73 square meter and albuminuria creatinine ratio less than 30 mg/g ([place]) 03/07/2020   Diabetes mellitus without complication [facility])    ED (erectile dysfunction)    Family history of adverse reaction to anesthesia    son had some problem years ago, pt unsure of what exact problem was   Hypertension    Obesity    Pneumonia    Past Surgical History:  Procedure Laterality Date    LEFT KNEE ARTHROCOPY     COLONOSCOPY WITH PROPOFOL Left 05/07/2020   Procedure: COLONOSCOPY WITH PROPOFOL;  Surgeon: Otis Brace, MD;  Location: [place] ENDOSCOPY;  Service: Gastroenterology;  Laterality: Left;   COLONOSCOPY WITH PROPOFOL N/A 05/11/2020   Procedure: COLONOSCOPY WITH PROPOFOL;  Surgeon: Wilford Corner, MD;  Location: [place];  Service: Endoscopy;  Laterality: N/A;   ESOPHAGOGASTRODUODENOSCOPY N/A 05/06/2020   Procedure: ESOPHAGOGASTRODUODENOSCOPY (EGD);  Surgeon: Arta Silence, MD;  Location: [facility] ENDOSCOPY;  Service: Endoscopy;  Laterality: N/A;   HERNIA REPAIR     IR ANGIOGRAM SELECTIVE EACH ADDITIONAL VESSEL  05/11/2020   IR ANGIOGRAM VISCERAL SELECTIVE  05/11/2020   IR EMBO ART  VEN HEMORR LYMPH EXTRAV  INC GUIDE ROADMAPPING  05/11/2020   IR IVC FILTER  PLMT / S&I /IMG GUID/MOD SED  05/11/2020   IR RADIOLOGIST EVAL & MGMT  08/27/2020   IR RADIOLOGIST EVAL & MGMT  02/26/2021   IR US GUIDE VASC ACCESS RIGHT  05/11/2020   IR VENOGRAM RENAL BI  05/11/2020   TRANSURETHRAL RESECTION OF PROSTATE N/A 12/26/2014   Procedure: TRANSURETHRAL RESECTION OF THE PROSTATE;  Surgeon: Cleon Gustin, MD;  Location: [facility];  Service: Urology;  Laterality: N/A;   Patient Active Problem List   Diagnosis Date Noted   Acute embolism and thrombosis of unspecified deep veins of unspecified lower extremity ([place]) 05/31/2021   Shoulder joint pain 05/31/2021   Abnormal gait 02/24/2021   Acute deep vein thrombosis (DVT) of calf muscle vein of left lower extremity ([place]) 02/24/2021   Acute pulmonary embolism ([place]) 02/24/2021   Hemorrhage of colon due to diverticulosis 02/24/2021   History of DVT (deep vein thrombosis) 02/24/2021   Malnutrition of mild degree Altamease [place]iler: 75% to less than 90% of standard weight) ([place]) 02/24/2021   Personal history of pulmonary embolism 02/24/2021   Polymyalgia rheumatica ([place]) 02/24/2021   Presence of other vascular implants and grafts 02/24/2021   Urinary hesitancy 02/24/2021   Vitamin B deficiency 02/24/2021   Hypomagnesemia 05/11/2020   Atrial fibrillation with RVR ([place]) 05/11/2020   GI bleed 05/05/2020   AKI (acute kidney injury) ([place])    2019 novel coronavirus disease (COVID-19) 03/07/2020  Anemia 03/07/2020   Arthropathy 03/07/2020   Body mass index (BMI) 32.0-32.9, adult 03/07/2020   Body mass index (BMI) 34.0-34.9, adult 03/07/2020   Chronic kidney disease (CKD) stage G3a/A1, moderately decreased glomerular filtration rate (GFR) between 45-59 mL/min/1.73 square meter and albuminuria creatinine ratio less than 30 mg/g ([facility]) 03/07/2020   Chronic sinusitis 03/07/2020   Constipation 03/07/2020   Diabetic renal disease ([place]) 03/07/2020   Hardening of the aorta (main artery of the heart) ([place]) 03/07/2020   Hyperlipidemia 03/07/2020    Microscopic hematuria 03/07/2020   Nausea 03/07/2020   Other abnormalities of gait and mobility 03/07/2020   Personal history of colonic polyps 03/07/2020   Pneumonia due to SARS-associated coronavirus 03/07/2020   Primary open-angle glaucoma, right eye, mild stage 03/07/2020   Degeneration of lumbar intervertebral disc 03/07/2020   Radiculopathy due to lumbar intervertebral disc disorder 03/07/2020   Right lower quadrant pain 03/07/2020   S/P TURP (status post transurethral resection of prostate) 03/07/2020   Secondary hyperparathyroidism of renal origin ([place]) 03/07/2020   Senile purpura ([place]) 03/07/2020   Sequelae of other specified infectious and parasitic diseases 03/07/2020   Stricture of artery ([place]) 03/07/2020   Subclinical hypothyroidism 03/07/2020   History of COVID-19 04/11/2019   Shortness of breath 04/11/2019   Irregular heart rhythm 04/11/2019   Fatigue 04/11/2019   Generalized weakness 04/11/2019   Acute respiratory failure with hypoxia ([place]) 02/02/2019   Essential hypertension 02/02/2019   Type 2 diabetes mellitus with complication, without long-term current use of insulin ([place]) 02/02/2019   Hypokalemia 02/02/2019   BPH (benign prostatic hyperplasia) 12/26/2014    PCP: Jonathon Jordan, MD  REFERRING PROVIDER: Saintclair Halsted, FNP  REFERRING DIAG: M54.41 Acute right sided low back pain with right-sided sciatica R26.9 Abnormal Gait  Rationale for Evaluation and Treatment: Rehabilitation  THERAPY DIAG:  Other low back pain  Other abnormalities of gait and mobility  Muscle weakness (generalized)  Difficulty in walking, not elsewhere classified  ONSET DATE: November 2023  SUBJECTIVE:                                                                                                                                                                                           SUBJECTIVE STATEMENT: Pt states that he was able to plant bulbs over the weekend and that he  can pick up wood from his woodpile and place in the stove without increased pain.  PERTINENT HISTORY:  Hypertension, chronic kidney disease, diabetes, lower lumbar spondylosis with vacuum disc phenomenon at L5-S1 and bilateral foraminal narrowing, OA, DVT  PAIN:  Are you having pain? Yes: NPRS scale: 0/10 Pain location: low  back into right groin Pain description: dull ache Aggravating factors: bending and getting up Relieving factors: lying supine  PRECAUTIONS: Fall  WEIGHT BEARING RESTRICTIONS: No  FALLS:  Has patient fallen in last 6 months? Yes. Number of falls 1 fall while walking outside around his wood pile  LIVING ENVIRONMENT: Lives with: lives with their spouse Lives in: House/apartment Stairs: Yes: External: 3 steps; bilateral but cannot reach both Has following equipment at home: Single point cane, Environmental consultant - 2 wheeled, Wheelchair (manual), and Grab bars  OCCUPATION: retired  PLOF: Independent and Leisure: watching football, gardening, going to [place]: To be able to walk better and have better balance.  NEXT MD VISIT: as needed, apart from yearly scheduled in October 2024  OBJECTIVE:   DIAGNOSTIC FINDINGS:  Cervical CT on 12/29/2020: 1. No acute displaced fracture or traumatic listhesis of the cervical spine. 2. Multilevel degenerative changes of the spine leading to severe C3-C4 and C4-C5 left osseous neural foraminal stenosis.  PATIENT SURVEYS:  12/24/2021:  FOTO 45% (Projected 63% by visit 13 02/05/2022:  FOTO 63%  SCREENING FOR RED FLAGS: Bowel or bladder incontinence: No Spinal tumors: No Cauda equina syndrome: No Compression fracture: No Abdominal aneurysm: No  COGNITION: Overall cognitive status: Within functional limits for tasks assessed     SENSATION: WFL  MUSCLE LENGTH: Hamstrings: Tightness bilaterally  POSTURE: rounded shoulders and forward head  PALPATION: Some tight lumbar paraspinals, but no tenderness  LUMBAR ROM:    Limited 25-50% with minimal pain with lumbar flexion  LOWER EXTREMITY ROM:     WFL  LOWER EXTREMITY MMT:    12/24/2021: Right hamstring strength of 4/5, Left is WFL Right quad strength of 4/5, left is WFL Right hip strength of 4-/5, Left hip strength of 4/5  LUMBAR SPECIAL TESTS:  Slump test: Positive  FUNCTIONAL TESTS:   12/24/2021: 5 times sit to stand: 22.3 sec with hands pushing up from chair rails Timed Up and Go (TUG):  23.1 sec  12/29/2021: 3 Minute Walk Test:  308 ft without assistive device (denies pain)  01/25/2022: 5 times sit to stand: 17.3 sec with hands pushing up from chair rails Timed Up and Go (TUG):  19.2 sec  02/12/2022: 3 Minute Walk Test:  313 ft without assistive device (denies pain)  02/15/2022: Timed Up and Go (TUG):  13.3 sec 5 times sit to stand: 16.2 sec with hands pushing up from chair rails  GAIT: Distance walked: 50 Assistive device utilized: None Level of assistance: SBA Comments: Slow gait velocity with shuffling gait pattern  TODAY'S TREATMENT:                                                                                                                               DATE: 02/15/2022 Nustep level 5 x7 min with PT present to discuss status TUG and 5 times sit to/from stand Seated with 5#:  heel/toe raises, marching, LAQ, hip ER.  2x10 bilat  each Ambulation around PT gyms with 5# on bilat ankles x2 laps with seated recovery period between each lap Seated shoulder ER and horizontal abduction with green tband 2x10 each Seated hamstring curls with green tband 2x10 bilat Standing hamstring stretch 2x20 sec bilat Wall squats x10 (with cuing for technique and body mechanics)   DATE: 02/12/2022 Nustep level 5 x7 min with PT present to discuss status Seated with 4#:  heel/toe raises, marching, LAQ, hip ER.  2x10 bilat each 3 min amb around both PT gyms Side stepping with wall for balance, as needed 3x10 ft bilat Hip abduction with green  tband 2x10 Seated hamstring curls with green tband 2x10 bilat Seated shoulder ER and horizontal abduction with green tband 2x10 each   DATE: 02/08/2022 Nustep level 5 x7 min with PT present to discuss status Seated with 4#:  heel/toe raises, marching, LAQ, hip ER.  2x10 bilat each Ambulation around PT gyms with 4# on bilat ankles x2 laps with seated recovery period between each lap Side stepping with wall for balance, as needed 2x10 ft bilat Alt LE toe taps to 6" step 2x10 Standing hamstring stretch 2x20 sec bilat Standing lat pull 40# x10     PATIENT EDUCATION:  Education details: Issued HEP Person educated: Patient Education method: Consulting civil engineer, Media planner, and Handouts Education comprehension: verbalized understanding and returned demonstration  HOME EXERCISE PROGRAM: Access Code: [email or web address] URL: [email or web address] Date: 12/29/2021 Prepared by: Shelby Dubin Bellamy Rubey  Exercises - Seated Hamstring Stretch  - 1 x daily - 7 x weekly - 1 sets - 2 reps - 20 sec hold - Seated Long Arc Quad  - 1 x daily - 7 x weekly - 2 sets - 10 reps - Seated March  - 1 x daily - 7 x weekly - 2 sets - 10 reps - Sit to Stand  - 1 x daily - 7 x weekly - 2 sets - 5 reps - Seated Hip Abduction with Resistance  - 1 x daily - 7 x weekly - 2 sets - 10 reps - Shoulder External Rotation and Scapular Retraction with Resistance  - 1 x daily - 7 x weekly - 2 sets - 10 reps - Standing Shoulder Horizontal Abduction with Resistance  - 1 x daily - 7 x weekly - 2 sets - 10 reps  ASSESSMENT:  CLINICAL IMPRESSION: Mr Latorre presents to skilled PT denying any falls and no back pain.  Patient is making progress with ability to pick up wood from his wood pile and put it into his wood stove without pain.  Patient has improved time on TUG and 5 times sit to/from stand.  Patient was able to progress to strengthening exercises during session and increased weight.  Patient reported good strengthening with  addition of wall slide.  Patient will be ready for discharge next visit, as anticipated.   OBJECTIVE IMPAIRMENTS: decreased activity tolerance, decreased balance, difficulty walking, decreased strength, increased muscle spasms, impaired flexibility, postural dysfunction, and pain.   ACTIVITY LIMITATIONS: lifting, bending, standing, and squatting  PARTICIPATION LIMITATIONS: cleaning, community activity, and yard work  PERSONAL FACTORS: Age, Past/current experiences, and 3+ comorbidities: OA, HTN, weakness since COVID in 2021, DM  are also affecting patient's functional outcome.   REHAB POTENTIAL: Good  CLINICAL DECISION MAKING: Evolving/moderate complexity  EVALUATION COMPLEXITY: Moderate   GOALS: Goals reviewed with patient? Yes  SHORT TERM GOALS: Target date: 01/14/2021  Pt to be independent with initial HEP. Baseline: Goal status: MET  2.  Pt able to complete a  3 minute walk test. Baseline:  Goal status: MET on 12/29/2021   LONG TERM GOALS: Target date: 02/19/2022  Pt will be independent with advanced HEP. Baseline:  Goal status: IN PROGRESS  2.  Patient to improve time with TUG to 15 seconds or less to decrease his risk of falling. Baseline: 23.1 sec Goal status: MET on 02/15/2022   3.  Pt will increase BLE strength to at least 4+/5 to allow him to perform functional tasks with increased ease. Baseline:  Goal status: INITIAL  4.  Patient to report ability to pick up wood from the pile and place in his wood stove without increased pain. Baseline:  Goal status: MET on 02/15/2022  5.  Patient to deny any falls or losses of balance within the last week of treatment with noted improved foot clearance during PT session. Baseline:  Goal status: IN PROGRESS  PLAN:  PT FREQUENCY: 2x/week  PT DURATION: 8 weeks  PLANNED INTERVENTIONS: Therapeutic exercises, Therapeutic activity, Neuromuscular re-education, Balance training, Gait training, Patient/Family education, Self  Care, Joint mobilization, Joint manipulation, Stair training, Aquatic Therapy, Dry Needling, Electrical stimulation, Spinal manipulation, Spinal mobilization, Cryotherapy, Moist heat, Taping, Traction, Ultrasound, Ionotophoresis 4mg /ml Dexamethasone, Manual therapy, and Re-evaluation.  PLAN FOR NEXT SESSION: Discharge visit   [place], Avdimou 02/15/2022, 10:13 AM   [facility] [address] [place], [place] Phone # [phone] Fax [phone]

## 2022-02-19 ENCOUNTER — Ambulatory Visit: Payer: Medicare Other | Admitting: Rehabilitative and Restorative Service Providers"

## 2022-02-19 ENCOUNTER — Encounter: Payer: Self-pay | Admitting: Rehabilitative and Restorative Service Providers"

## 2022-02-19 DIAGNOSIS — M6281 Muscle weakness (generalized): Secondary | ICD-10-CM | POA: Diagnosis not present

## 2022-02-19 DIAGNOSIS — M5459 Other low back pain: Secondary | ICD-10-CM | POA: Diagnosis not present

## 2022-02-19 DIAGNOSIS — R262 Difficulty in walking, not elsewhere classified: Secondary | ICD-10-CM

## 2022-02-19 DIAGNOSIS — R2689 Other abnormalities of gait and mobility: Secondary | ICD-10-CM

## 2022-02-19 NOTE — Therapy (Signed)
OUTPATIENT PHYSICAL THERAPY TREATMENT NOTE AND DISCHARGE SUMMARY   Patient Name: Thomas Joseph MRN: [id number] DOB:Aug 11, 1933, 89 y.o., male Today's Date: 02/19/2022  END OF SESSION:  PT End of Session - 02/19/22 0935     Visit Number 10    Date for PT Re-Evaluation 02/19/22    Authorization Type [employer] Medicare    PT Start Time 0930    PT Stop Time 1010    PT Time Calculation (min) 40 min    Activity Tolerance Patient tolerated treatment well    Behavior During Therapy WFL for tasks assessed/performed             Past Medical History:  Diagnosis Date   Anemia    BPH (benign prostatic hyperplasia)    Chronic kidney disease (CKD) stage G3a/A1, moderately decreased glomerular filtration rate (GFR) between 45-59 mL/min/1.73 square meter and albuminuria creatinine ratio less than 30 mg/g ([place]) 03/07/2020   Diabetes mellitus without complication [facility])    ED (erectile dysfunction)    Family history of adverse reaction to anesthesia    son had some problem years ago, pt unsure of what exact problem was   Hypertension    Obesity    Pneumonia    Past Surgical History:  Procedure Laterality Date    LEFT KNEE ARTHROCOPY     COLONOSCOPY WITH PROPOFOL Left 05/07/2020   Procedure: COLONOSCOPY WITH PROPOFOL;  Surgeon: Otis Brace, MD;  Location: [facility];  Service: Gastroenterology;  Laterality: Left;   COLONOSCOPY WITH PROPOFOL N/A 05/11/2020   Procedure: COLONOSCOPY WITH PROPOFOL;  Surgeon: Wilford Corner, MD;  Location: [facility];  Service: Endoscopy;  Laterality: N/A;   ESOPHAGOGASTRODUODENOSCOPY N/A 05/06/2020   Procedure: ESOPHAGOGASTRODUODENOSCOPY (EGD);  Surgeon: Arta Silence, MD;  Location: [facility];  Service: Endoscopy;  Laterality: N/A;   HERNIA REPAIR     IR ANGIOGRAM SELECTIVE EACH ADDITIONAL VESSEL  05/11/2020   IR ANGIOGRAM VISCERAL SELECTIVE  05/11/2020   IR EMBO ART  VEN HEMORR LYMPH EXTRAV  INC GUIDE ROADMAPPING  05/11/2020   IR IVC FILTER PLMT / S&I  /IMG GUID/MOD SED  05/11/2020   IR RADIOLOGIST EVAL & MGMT  08/27/2020   IR RADIOLOGIST EVAL & MGMT  02/26/2021   IR US GUIDE VASC ACCESS RIGHT  05/11/2020   IR VENOGRAM RENAL BI  05/11/2020   TRANSURETHRAL RESECTION OF PROSTATE N/A 12/26/2014   Procedure: TRANSURETHRAL RESECTION OF THE PROSTATE;  Surgeon: Cleon Gustin, MD;  Location: [facility];  Service: Urology;  Laterality: N/A;   Patient Active Problem List   Diagnosis Date Noted   Acute embolism and thrombosis of unspecified deep veins of unspecified lower extremity ([place]) 05/31/2021   Shoulder joint pain 05/31/2021   Abnormal gait 02/24/2021   Acute deep vein thrombosis (DVT) of calf muscle vein of left lower extremity ([employer]) 02/24/2021   Acute pulmonary embolism ([place]) 02/24/2021   Hemorrhage of colon due to diverticulosis 02/24/2021   History of DVT (deep vein thrombosis) 02/24/2021   Malnutrition of mild degree [place]: 75% to less than 90% of standard weight) ([place]) 02/24/2021   Personal history of pulmonary embolism 02/24/2021   Polymyalgia rheumatica ([place]) 02/24/2021   Presence of other vascular implants and grafts 02/24/2021   Urinary hesitancy 02/24/2021   Vitamin B deficiency 02/24/2021   Hypomagnesemia 05/11/2020   Atrial fibrillation with RVR ([place]) 05/11/2020   GI bleed 05/05/2020   AKI (acute kidney injury) ([place])    2019 novel coronavirus disease (COVID-19) 03/07/2020   Anemia 03/07/2020  Arthropathy 03/07/2020   Body mass index (BMI) 32.0-32.9, adult 03/07/2020   Body mass index (BMI) 34.0-34.9, adult 03/07/2020   Chronic kidney disease (CKD) stage G3a/A1, moderately decreased glomerular filtration rate (GFR) between 45-59 mL/min/1.73 square meter and albuminuria creatinine ratio less than 30 mg/g ([facility]) 03/07/2020   Chronic sinusitis 03/07/2020   Constipation 03/07/2020   Diabetic renal disease ([place]) 03/07/2020   Hardening of the aorta (main artery of the heart) ([place]) 03/07/2020   Hyperlipidemia 03/07/2020    Microscopic hematuria 03/07/2020   Nausea 03/07/2020   Other abnormalities of gait and mobility 03/07/2020   Personal history of colonic polyps 03/07/2020   Pneumonia due to SARS-associated coronavirus 03/07/2020   Primary open-angle glaucoma, right eye, mild stage 03/07/2020   Degeneration of lumbar intervertebral disc 03/07/2020   Radiculopathy due to lumbar intervertebral disc disorder 03/07/2020   Right lower quadrant pain 03/07/2020   S/P TURP (status post transurethral resection of prostate) 03/07/2020   Secondary hyperparathyroidism of renal origin ([place]) 03/07/2020   Senile purpura ([place]) 03/07/2020   Sequelae of other specified infectious and parasitic diseases 03/07/2020   Stricture of artery ([place]) 03/07/2020   Subclinical hypothyroidism 03/07/2020   History of COVID-19 04/11/2019   Shortness of breath 04/11/2019   Irregular heart rhythm 04/11/2019   Fatigue 04/11/2019   Generalized weakness 04/11/2019   Acute respiratory failure with hypoxia ([place]) 02/02/2019   Essential hypertension 02/02/2019   Type 2 diabetes mellitus with complication, without long-term current use of insulin ([place]) 02/02/2019   Hypokalemia 02/02/2019   BPH (benign prostatic hyperplasia) 12/26/2014    PCP: Jonathon Jordan, MD  REFERRING PROVIDER: Saintclair Halsted, FNP  REFERRING DIAG: M54.41 Acute right sided low back pain with right-sided sciatica R26.9 Abnormal Gait  Rationale for Evaluation and Treatment: Rehabilitation  THERAPY DIAG:  Other low back pain  Other abnormalities of gait and mobility  Muscle weakness (generalized)  Difficulty in walking, not elsewhere classified  ONSET DATE: November 2023  SUBJECTIVE:                                                                                                                                                                                           SUBJECTIVE STATEMENT: Pt continues to deny any falls and denies back pain  today.  PERTINENT HISTORY:  Hypertension, chronic kidney disease, diabetes, lower lumbar spondylosis with vacuum disc phenomenon at L5-S1 and bilateral foraminal narrowing, OA, DVT  PAIN:  Are you having pain? Yes: NPRS scale: 0/10 Pain location: low back into right groin Pain description: dull ache Aggravating factors: bending and getting up Relieving factors: lying supine  PRECAUTIONS: Fall  WEIGHT  BEARING RESTRICTIONS: No  FALLS:  Has patient fallen in last 6 months? Yes. Number of falls 1 fall while walking outside around his wood pile  LIVING ENVIRONMENT: Lives with: lives with their spouse Lives in: House/apartment Stairs: Yes: External: 3 steps; bilateral but cannot reach both Has following equipment at home: Single point cane, Environmental consultant - 2 wheeled, Wheelchair (manual), and Grab bars  OCCUPATION: retired  PLOF: Independent and Leisure: watching football, gardening, going to [place]: To be able to walk better and have better balance.  NEXT MD VISIT: as needed, apart from yearly scheduled in October 2024  OBJECTIVE:   DIAGNOSTIC FINDINGS:  Cervical CT on 12/29/2020: 1. No acute displaced fracture or traumatic listhesis of the cervical spine. 2. Multilevel degenerative changes of the spine leading to severe C3-C4 and C4-C5 left osseous neural foraminal stenosis.  PATIENT SURVEYS:  12/24/2021:  FOTO 45% (Projected 63% by visit 13 02/05/2022:  FOTO 63%  SCREENING FOR RED FLAGS: Bowel or bladder incontinence: No Spinal tumors: No Cauda equina syndrome: No Compression fracture: No Abdominal aneurysm: No  COGNITION: Overall cognitive status: Within functional limits for tasks assessed     SENSATION: WFL  MUSCLE LENGTH: Hamstrings: Tightness bilaterally  POSTURE: rounded shoulders and forward head  PALPATION: Some tight lumbar paraspinals, but no tenderness  LUMBAR ROM:   Limited 25-50% with minimal pain with lumbar flexion  LOWER  EXTREMITY ROM:     WFL  LOWER EXTREMITY MMT:    12/24/2021: Right hamstring strength of 4/5, Left is WFL Right quad strength of 4/5, left is [facility] Right hip strength of 4-/5, Left hip strength of 4/5  02/19/2022: Bilateral hip strength of at least 4+/5, bilateral quad strength is 5/5  LUMBAR SPECIAL TESTS:  Slump test: Positive  FUNCTIONAL TESTS:   12/24/2021: 5 times sit to stand: 22.3 sec with hands pushing up from chair rails Timed Up and Go (TUG):  23.1 sec  12/29/2021: 3 Minute Walk Test:  308 ft without assistive device (denies pain)  01/25/2022: 5 times sit to stand: 17.3 sec with hands pushing up from chair rails Timed Up and Go (TUG):  19.2 sec  02/12/2022: 3 Minute Walk Test:  313 ft without assistive device (denies pain)  02/15/2022: Timed Up and Go (TUG):  13.3 sec 5 times sit to stand: 16.2 sec with hands pushing up from chair rails  GAIT: Distance walked: 50 Assistive device utilized: None Level of assistance: SBA Comments: Slow gait velocity with shuffling gait pattern  TODAY'S TREATMENT:                                                                                                                               DATE: 02/19/2022 Nustep level 5 x7 min with PT present to discuss status Seated with 5#:  heel/toe raises, marching, LAQ, hip ER.  2x10 bilat each Ambulation around PT gyms with 5# on bilat ankles x2 laps with seated recovery period  between each lap Seated shoulder ER and horizontal abduction with green tband 2x10 each Seated hamstring curls with green tband 2x10 bilat Wall squats x10 (with cuing for technique and body mechanics) Standing hamstring stretch at stairs 2x20 sec bilat   DATE: 02/15/2022 Nustep level 5 x7 min with PT present to discuss status TUG and 5 times sit to/from stand Seated with 5#:  heel/toe raises, marching, LAQ, hip ER.  2x10 bilat each Ambulation around PT gyms with 5# on bilat ankles x2 laps with seated recovery period  between each lap Seated shoulder ER and horizontal abduction with green tband 2x10 each Seated hamstring curls with green tband 2x10 bilat Standing hamstring stretch 2x20 sec bilat Wall squats x10 (with cuing for technique and body mechanics)     PATIENT EDUCATION:  Education details: Issued HEP Person educated: Patient Education method: Explanation, Demonstration, and Handouts Education comprehension: verbalized understanding and returned demonstration  HOME EXERCISE PROGRAM: Access Code: [email or web address] URL: [email or web address] Date: 02/19/2022 Prepared by: Shelby Dubin Ory Elting  Exercises - Seated Hamstring Stretch  - 1 x daily - 7 x weekly - 1 sets - 2 reps - 20 sec hold - Seated Long Arc Quad  - 1 x daily - 7 x weekly - 2 sets - 10 reps - Seated March  - 1 x daily - 7 x weekly - 2 sets - 10 reps - Sit to Stand  - 1 x daily - 7 x weekly - 2 sets - 5 reps - Seated Hip Abduction with Resistance  - 1 x daily - 7 x weekly - 2 sets - 10 reps - Shoulder External Rotation and Scapular Retraction with Resistance  - 1 x daily - 7 x weekly - 2 sets - 10 reps - Standing Shoulder Horizontal Abduction with Resistance  - 1 x daily - 7 x weekly - 2 sets - 10 reps - Standing March with Counter Support  - 1 x daily - 7 x weekly - 2 sets - 10 reps - Standing Hip Abduction with Counter Support  - 1 x daily - 7 x weekly - 2 sets - 10 reps - Standing Hip Extension with Counter Support  - 1 x daily - 7 x weekly - 2 sets - 10 reps  ASSESSMENT:  CLINICAL IMPRESSION: Mr Marovich presents to skilled PT denying any falls and no back pain.  Patient has met all goals at this time and is independent with his HEP.  Patient has been able to resume doing yard work and picking up wood from his wood pile.  Patient with improved foot clearance during ambulation and overall increased distance with 3 minute walk test.  Patient is ready for discharge at this time to continue with HEP.   OBJECTIVE IMPAIRMENTS:  decreased activity tolerance, decreased balance, difficulty walking, decreased strength, increased muscle spasms, impaired flexibility, postural dysfunction, and pain.   ACTIVITY LIMITATIONS: lifting, bending, standing, and squatting  PARTICIPATION LIMITATIONS: cleaning, community activity, and yard work  PERSONAL FACTORS: Age, Past/current experiences, and 3+ comorbidities: OA, HTN, weakness since COVID in 2021, DM  are also affecting patient's functional outcome.   REHAB POTENTIAL: Good  CLINICAL DECISION MAKING: Evolving/moderate complexity  EVALUATION COMPLEXITY: Moderate   GOALS: Goals reviewed with patient? Yes  SHORT TERM GOALS: Target date: 01/14/2021  Pt to be independent with initial HEP. Baseline: Goal status: MET  2.  Pt able to complete a 3 minute walk test. Baseline:  Goal status: MET on 12/29/2021   LONG TERM GOALS: Target date:  02/19/2022  Pt will be independent with advanced HEP. Baseline:  Goal status: MET  2.  Patient to improve time with TUG to 15 seconds or less to decrease his risk of falling. Baseline: 23.1 sec Goal status: MET on 02/15/2022   3.  Pt will increase BLE strength to at least 4+/5 to allow him to perform functional tasks with increased ease. Baseline:  Goal status: MET  4.  Patient to report ability to pick up wood from the pile and place in his wood stove without increased pain. Baseline:  Goal status: MET on 02/15/2022  5.  Patient to deny any falls or losses of balance within the last week of treatment with noted improved foot clearance during PT session. Baseline:  Goal status: MET  PLAN:  PT FREQUENCY: 2x/week  PT DURATION: 8 weeks  PLANNED INTERVENTIONS: Therapeutic exercises, Therapeutic activity, Neuromuscular re-education, Balance training, Gait training, Patient/Family education, Self Care, Joint mobilization, Joint manipulation, Stair training, Aquatic Therapy, Dry Needling, Electrical stimulation, Spinal manipulation,  Spinal mobilization, Cryotherapy, Moist heat, Taping, Traction, Ultrasound, Ionotophoresis 8m/ml Dexamethasone, Manual therapy, and Re-evaluation.   PHYSICAL THERAPY DISCHARGE SUMMARY   Patient agrees to discharge. Patient goals were met. Patient is being discharged due to meeting the stated rehab goals.     SJuel Burrow PT 02/19/2022, 10:15 AM   [facility] SWayneGOklahoma [place] [address]Phone # [phone] [phone]

## 2022-03-30 DIAGNOSIS — E119 Type 2 diabetes mellitus without complications: Secondary | ICD-10-CM | POA: Diagnosis not present

## 2022-04-01 ENCOUNTER — Emergency Department (HOSPITAL_COMMUNITY)
Admission: EM | Admit: 2022-04-01 | Discharge: 2022-04-02 | Disposition: A | Discharge status: Home / Self Care | Payer: Medicare Other | Attending: Emergency Medicine | Admitting: Emergency Medicine

## 2022-04-01 ENCOUNTER — Emergency Department (HOSPITAL_COMMUNITY): Payer: Medicare Other

## 2022-04-01 ENCOUNTER — Other Ambulatory Visit: Payer: Self-pay

## 2022-04-01 ENCOUNTER — Encounter (HOSPITAL_COMMUNITY): Payer: Self-pay

## 2022-04-01 DIAGNOSIS — E1122 Type 2 diabetes mellitus with diabetic chronic kidney disease: Secondary | ICD-10-CM | POA: Insufficient documentation

## 2022-04-01 DIAGNOSIS — Z7984 Long term (current) use of oral hypoglycemic drugs: Secondary | ICD-10-CM | POA: Diagnosis not present

## 2022-04-01 DIAGNOSIS — E039 Hypothyroidism, unspecified: Secondary | ICD-10-CM | POA: Diagnosis not present

## 2022-04-01 DIAGNOSIS — I129 Hypertensive chronic kidney disease with stage 1 through stage 4 chronic kidney disease, or unspecified chronic kidney disease: Secondary | ICD-10-CM | POA: Insufficient documentation

## 2022-04-01 DIAGNOSIS — I4891 Unspecified atrial fibrillation: Secondary | ICD-10-CM | POA: Insufficient documentation

## 2022-04-01 DIAGNOSIS — Z79899 Other long term (current) drug therapy: Secondary | ICD-10-CM | POA: Diagnosis not present

## 2022-04-01 DIAGNOSIS — Z8616 Personal history of COVID-19: Secondary | ICD-10-CM | POA: Insufficient documentation

## 2022-04-01 DIAGNOSIS — Z86718 Personal history of other venous thrombosis and embolism: Secondary | ICD-10-CM | POA: Diagnosis not present

## 2022-04-01 DIAGNOSIS — R42 Dizziness and giddiness: Secondary | ICD-10-CM | POA: Diagnosis not present

## 2022-04-01 DIAGNOSIS — R791 Abnormal coagulation profile: Secondary | ICD-10-CM | POA: Insufficient documentation

## 2022-04-01 DIAGNOSIS — R600 Localized edema: Secondary | ICD-10-CM | POA: Insufficient documentation

## 2022-04-01 DIAGNOSIS — R0600 Dyspnea, unspecified: Secondary | ICD-10-CM | POA: Diagnosis not present

## 2022-04-01 DIAGNOSIS — R5383 Other fatigue: Secondary | ICD-10-CM | POA: Diagnosis not present

## 2022-04-01 DIAGNOSIS — N1831 Chronic kidney disease, stage 3a: Secondary | ICD-10-CM | POA: Insufficient documentation

## 2022-04-01 DIAGNOSIS — R0602 Shortness of breath: Secondary | ICD-10-CM | POA: Diagnosis not present

## 2022-04-01 DIAGNOSIS — Z7901 Long term (current) use of anticoagulants: Secondary | ICD-10-CM | POA: Diagnosis not present

## 2022-04-01 DIAGNOSIS — R0609 Other forms of dyspnea: Secondary | ICD-10-CM

## 2022-04-01 LAB — CBC WITH DIFFERENTIAL/PLATELET
Abs Immature Granulocytes: 0.01 10*3/uL (ref 0.00–0.07)
Basophils Absolute: 0 10*3/uL (ref 0.0–0.1)
Basophils Relative: 1 %
Eosinophils Absolute: 0.2 10*3/uL (ref 0.0–0.5)
Eosinophils Relative: 5 %
HCT: 36.3 % — ABNORMAL LOW (ref 39.0–52.0)
Hemoglobin: 12.4 g/dL — ABNORMAL LOW (ref 13.0–17.0)
Immature Granulocytes: 0 %
Lymphocytes Relative: 28 %
Lymphs Abs: 1.3 10*3/uL (ref 0.7–4.0)
MCH: 31.7 pg (ref 26.0–34.0)
MCHC: 34.2 g/dL (ref 30.0–36.0)
MCV: 92.8 fL (ref 80.0–100.0)
Monocytes Absolute: 0.6 10*3/uL (ref 0.1–1.0)
Monocytes Relative: 14 %
Neutro Abs: 2.4 10*3/uL (ref 1.7–7.7)
Neutrophils Relative %: 52 %
Platelets: 205 10*3/uL (ref 150–400)
RBC: 3.91 MIL/uL — ABNORMAL LOW (ref 4.22–5.81)
RDW: 12.8 % (ref 11.5–15.5)
WBC: 4.6 10*3/uL (ref 4.0–10.5)
nRBC: 0 % (ref 0.0–0.2)

## 2022-04-01 LAB — BASIC METABOLIC PANEL
Anion gap: 8 (ref 5–15)
BUN: 28 mg/dL — ABNORMAL HIGH (ref 8–23)
CO2: 27 mmol/L (ref 22–32)
Calcium: 9.4 mg/dL (ref 8.9–10.3)
Chloride: 103 mmol/L (ref 98–111)
Creatinine, Ser: 1.52 mg/dL — ABNORMAL HIGH (ref 0.61–1.24)
GFR, Estimated: 44 mL/min — ABNORMAL LOW (ref >60)
Glucose, Bld: 183 mg/dL — ABNORMAL HIGH (ref 70–99)
Potassium: 3.6 mmol/L (ref 3.5–5.1)
Sodium: 138 mmol/L (ref 135–145)

## 2022-04-01 MED ORDER — IOHEXOL 350 MG/ML SOLN
75.0000 mL | Freq: Once | INTRAVENOUS | Status: AC | PRN
  Administered 2022-04-01: 75 mL via INTRAVENOUS

## 2022-04-01 NOTE — ED Provider Triage Note (Signed)
Emergency Medicine Provider Triage Evaluation Note  Thomas Joseph , a 89 y.o. male  was evaluated in triage.  Pt complains of shortness of breath which is been ongoing for 1 week and significantly worsened today.  He was evaluated by [facility] physicians today and was found to have an elevated D-dimer over 2.4.  Patient comes to the emergency department for further evaluation and rule out for pulmonary embolism.  He denies chest pain, abdominal pain at this time.  He does endorse a headache at this time  Review of Systems  Positive: As above Negative: As above  Physical Exam  There were no vitals taken for this visit. Gen:   Awake, no distress   Resp:  Normal effort  MSK:   Moves extremities without difficulty  Other:    Medical Decision Making  Medically screening exam initiated at 7:51 PM.  Appropriate orders placed.  [name] was informed that the remainder of the evaluation will be completed by another provider, this initial triage assessment does not replace that evaluation, and the importance of remaining in the ED until their evaluation is complete.     Dorothyann Peng, [place] 04/01/22 1951

## 2022-04-01 NOTE — ED Triage Notes (Signed)
Reports waking up this morning feeling poorly and short of breath. Went to see [employer] and they notified of elevated D-Dimer.   Encouraged to ER to r/o Pe[facility].

## 2022-04-02 ENCOUNTER — Emergency Department (HOSPITAL_BASED_OUTPATIENT_CLINIC_OR_DEPARTMENT_OTHER)
Admission: RE | Admit: 2022-04-02 | Discharge: 2022-04-02 | Disposition: A | Discharge status: Home / Self Care | Payer: Medicare Other | Source: Ambulatory Visit | Attending: Emergency Medicine | Admitting: Emergency Medicine

## 2022-04-02 DIAGNOSIS — M7989 Other specified soft tissue disorders: Secondary | ICD-10-CM | POA: Diagnosis not present

## 2022-04-02 DIAGNOSIS — R7989 Other specified abnormal findings of blood chemistry: Secondary | ICD-10-CM | POA: Diagnosis not present

## 2022-04-02 DIAGNOSIS — R0602 Shortness of breath: Secondary | ICD-10-CM | POA: Diagnosis not present

## 2022-04-02 LAB — BRAIN NATRIURETIC PEPTIDE: B Natriuretic Peptide: 210.3 pg/mL — ABNORMAL HIGH (ref 0.0–100.0)

## 2022-04-02 MED ORDER — FUROSEMIDE 20 MG PO TABS: 10.0000 mg | ORAL_TABLET | Freq: Every day | ORAL | 0 refills | Status: DC

## 2022-04-02 NOTE — ED Notes (Signed)
Pt O2 level did not drop while ambulating stayed at 100%

## 2022-04-02 NOTE — ED Provider Notes (Signed)
[facility] Provider Note  CSN: [id number] Arrival date & time: 04/01/22 1945  Chief Complaint(s) Shortness of Breath  HPI Thomas Joseph is a 89 y.o. male with a past medical history listed below including prior DVT who completed a course of anticoagulation who presented to the emergency department at the behest of his primary care provider to rule out PEs given elevated D-dimer.  Patient has been complaining shortness of breath over the past several weeks worsening over the past few days.  He does have peripheral edema right greater than left.  He denies any history of heart failure but on review of records, patient has grade 1 diastolic dysfunction.  He denies any cough or congestion.  No chest pain.  No other physical complaints.  The history is provided by the patient.    Past Medical History Past Medical History:  Diagnosis Date   Anemia    BPH (benign prostatic hyperplasia)    Chronic kidney disease (CKD) stage G3a/A1, moderately decreased glomerular filtration rate (GFR) between 45-59 mL/min/1.73 square meter and albuminuria creatinine ratio less than 30 mg/g ([place]) 03/07/2020   Diabetes mellitus without complication [facility])    ED (erectile dysfunction)    Family history of adverse reaction to anesthesia    son had some problem years ago, pt unsure of what exact problem was   Hypertension    Obesity    Pneumonia    Patient Active Problem List   Diagnosis Date Noted   Acute embolism and thrombosis of unspecified deep veins of unspecified lower extremity ([place]) 05/31/2021   Shoulder joint pain 05/31/2021   Abnormal gait 02/24/2021   Acute deep vein thrombosis (DVT) of calf muscle vein of left lower extremity ([place]) 02/24/2021   Acute pulmonary embolism ([place]) 02/24/2021   Hemorrhage of colon due to diverticulosis 02/24/2021   History of DVT (deep vein thrombosis) 02/24/2021   Malnutrition of mild degree Altamease [place]: 75% to less than 90%  of standard weight) ([place]) 02/24/2021   Personal history of pulmonary embolism 02/24/2021   Polymyalgia rheumatica ([place]) 02/24/2021   Presence of other vascular implants and grafts 02/24/2021   Urinary hesitancy 02/24/2021   Vitamin B deficiency 02/24/2021   Hypomagnesemia 05/11/2020   Atrial fibrillation with RVR ([place]) 05/11/2020   GI bleed 05/05/2020   AKI (acute kidney injury) ([place])    2019 novel coronavirus disease (COVID-19) 03/07/2020   Anemia 03/07/2020   Arthropathy 03/07/2020   Body mass index (BMI) 32.0-32.9, adult 03/07/2020   Body mass index (BMI) 34.0-34.9, adult 03/07/2020   Chronic kidney disease (CKD) stage G3a/A1, moderately decreased glomerular filtration rate (GFR) between 45-59 mL/min/1.73 square meter and albuminuria creatinine ratio less than 30 mg/g ([facility]) 03/07/2020   Chronic sinusitis 03/07/2020   Constipation 03/07/2020   Diabetic renal disease ([place]) 03/07/2020   Hardening of the aorta (main artery of the heart) ([place]) 03/07/2020   Hyperlipidemia 03/07/2020   Microscopic hematuria 03/07/2020   Nausea 03/07/2020   Other abnormalities of gait and mobility 03/07/2020   Personal history of colonic polyps 03/07/2020   Pneumonia due to SARS-associated coronavirus 03/07/2020   Primary open-angle glaucoma, right eye, mild stage 03/07/2020   Degeneration of lumbar intervertebral disc 03/07/2020   Radiculopathy due to lumbar intervertebral disc disorder 03/07/2020   Right lower quadrant pain 03/07/2020   S/P TURP (status post transurethral resection of prostate) 03/07/2020   Secondary hyperparathyroidism of renal origin ([place]) 03/07/2020   Senile purpura ([place]) 03/07/2020   Sequelae of  other specified infectious and parasitic diseases 03/07/2020   Stricture of artery ([place]) 03/07/2020   Subclinical hypothyroidism 03/07/2020   History of COVID-19 04/11/2019   Shortness of breath 04/11/2019   Irregular heart rhythm 04/11/2019   Fatigue 04/11/2019   Generalized weakness  04/11/2019   Acute respiratory failure with hypoxia ([place]) 02/02/2019   Essential hypertension 02/02/2019   Type 2 diabetes mellitus with complication, without long-term current use of insulin ([place]) 02/02/2019   Hypokalemia 02/02/2019   BPH (benign prostatic hyperplasia) 12/26/2014   Home Medication(s) Prior to Admission medications   Medication Sig Start Date End Date Taking? Authorizing Provider  furosemide (LASIX) 20 MG tablet Take 0.5 tablets (10 mg total) by mouth daily for 10 days. 04/02/22 04/12/22 Yes Billiejo Sorto, Grayce Sessions, MD  acetaminophen (TYLENOL) 325 MG tablet Take 325-650 mg by mouth every 12 (twelve) hours as needed for mild pain or headache.    [provider]  acetaminophen (TYLENOL) 325 MG tablet 1 tablet as needed    [provider]  amLODipine (NORVASC) 10 MG tablet Take 10 mg by mouth daily.    [provider]  amLODipine (NORVASC) 10 MG tablet 1 tablet    [provider]  Ascorbic Acid (VITAMIN C) 500 MG CAPS Take 1 tablet by mouth daily. Patient not taking: Reported on 12/24/2021 02/08/19   [provider]  Blood Pressure Monitor DEVI  10/23/19   [provider]  cetirizine (ZYRTEC ALLERGY) 10 MG tablet 1 tablet as needed    [provider]  cetirizine (ZYRTEC) 10 MG tablet Take 10 mg by mouth daily as needed for allergies.    [provider]  Cyanocobalamin (VITAMIN B-12) 500 MCG TABS Take 500 mcg by mouth daily. 05/20/20   Geradine Girt, DO  docusate sodium (COLACE) 100 MG capsule 1 capsule 11/24/20   [provider]  Iron-FA-B Cmp-C-Biot-Probiotic (FUSION PLUS) CAPS Take 1 capsule by mouth daily. Patient not taking: Reported on 12/24/2021 06/26/20   [provider]  latanoprost (XALATAN) 0.005 % ophthalmic solution Place 1 drop into both eyes at bedtime. 01/19/19   [provider]  latanoprost (XALATAN) 0.005 % ophthalmic solution 1 drop into both eyes at bedtime     [provider]  lisinopril-hydrochlorothiazide (ZESTORETIC) 20-12.5 MG tablet Take 1 tablet by mouth daily. 02/15/19   [provider]  metFORMIN (GLUCOPHAGE-XR) 500 MG 24 hr tablet Take 2 tablets by mouth daily. 07/03/20   [provider]  metoprolol succinate (TOPROL-XL) 25 MG 24 hr tablet Take 25 mg by mouth daily. 05/28/20   [provider]  polyethylene glycol (MIRALAX / GLYCOLAX) 17 g packet Take 17 g by mouth daily. Patient not taking: Reported on 12/24/2021 05/21/20   Geradine Girt, DO  predniSONE (DELTASONE) 20 MG tablet Take 2 tablets (40 mg total) by mouth daily. Patient taking differently: Take 10 mg by mouth daily. 12/30/20   Horton, Barbette Hair, MD  Psyllium (METAMUCIL) 48.57 % POWD 1 packet    [provider]  Psyllium (METAMUCIL) 48.57 % POWD 1 packet    [provider]  rosuvastatin (CRESTOR) 5 MG tablet Take 5 mg by mouth daily. 08/26/19   [provider]  sulfamethoxazole-trimethoprim (BACTRIM DS) 800-160 MG tablet Take 1 tablet by mouth 2 (two) times daily. Patient not taking: Reported on 12/24/2021 06/27/20   [provider]  vitamin B-12 (CYANOCOBALAMIN) 1000 MCG tablet 1 tablet 05/20/20   [provider]  zinc sulfate 220 (50 Zn) MG  capsule Take 220 mg by mouth daily. Patient not taking: Reported on 12/24/2021 02/08/19   [provider]  benazepril (LOTENSIN) 40 MG tablet Take 40 mg by mouth daily.  02/02/19  [provider]  zolpidem (AMBIEN) 10 MG tablet Take 10 mg by mouth at bedtime as needed for sleep.   02/02/19  [provider]                                                                                                                                    Allergies Atorvastatin, Hydrochlorothiazide, Metoprolol, Tizanidine hcl, Sildenafil, and Viagra [sildenafil citrate]  Review of Systems Review of Systems As noted in HPI  Physical Exam Vital Signs  I have  reviewed the triage vital signs BP (!) 177/84   Pulse 67   Temp 97.6 F (36.4 C)   Resp 18   Ht 6\' 1"  (1.854 m)   Wt 103 kg   SpO2 100%   BMI 29.95 kg/m   Physical Exam Vitals reviewed.  Constitutional:      General: He is not in acute distress.    Appearance: He is well-developed. He is not diaphoretic.  HENT:     Head: Normocephalic and atraumatic.     Nose: Nose normal.  Eyes:     General: No scleral icterus.       Right eye: No discharge.        Left eye: No discharge.     Conjunctiva/sclera: Conjunctivae normal.     Pupils: Pupils are equal, round, and reactive to light.  Cardiovascular:     Rate and Rhythm: Normal rate and regular rhythm.     Heart sounds: No murmur heard.    No friction rub. No gallop.     Comments: Edema Right>Left Pulmonary:     Effort: Pulmonary effort is normal. No respiratory distress.     Breath sounds: Normal breath sounds. No stridor. No rales.  Abdominal:     General: There is no distension.     Palpations: Abdomen is soft.     Tenderness: There is no abdominal tenderness.  Musculoskeletal:        General: No tenderness.     Cervical back: Normal range of motion and neck supple.     Right lower leg: 1+ Pitting Edema present.     Left lower leg: 1+ Pitting Edema present.  Skin:    General: Skin is warm and dry.     Findings: No erythema or rash.  Neurological:     Mental Status: He is alert and oriented to person, place, and time.     ED Results and Treatments Labs (all labs ordered are listed, but only abnormal results are displayed) Labs Reviewed  BASIC METABOLIC PANEL - Abnormal; Notable for the following components:      Result Value   Glucose, Bld 183 (*)    BUN 28 (*)    Creatinine,  Ser 1.52 (*)    GFR, Estimated 44 (*)    All other components within normal limits  CBC WITH DIFFERENTIAL/PLATELET - Abnormal; Notable for the following components:   RBC 3.91 (*)    Hemoglobin 12.4 (*)    HCT 36.3 (*)    All other  components within normal limits  BRAIN NATRIURETIC PEPTIDE - Abnormal; Notable for the following components:   B Natriuretic Peptide 210.3 (*)    All other components within normal limits                                                                                                                         EKG  EKG Interpretation  Date/Time:  Thursday April 01 2022 19:42:31 EDT Ventricular Rate:  71 PR Interval:  196 QRS Duration: 90 QT Interval:  408 QTC Calculation: 443 R Axis:   -14 Text Interpretation: Sinus rhythm with occasional Premature ventricular complexes Otherwise normal ECG When compared with ECG of 30-Dec-2020 03:58, PREVIOUS ECG IS PRESENT motion artifact Confirmed by Addison Lank [phone]) on 04/02/2022 12:05:12 AM       Radiology CT Angio Chest PE W/Cm &/Or Wo Cm  Result Date: 04/01/2022 CLINICAL DATA:  Shortness of breath. Pulmonary embolism (PE) suspected, high prob EXAM: CT ANGIOGRAPHY CHEST WITH CONTRAST TECHNIQUE: Multidetector CT imaging of the chest was performed using the standard protocol during bolus administration of intravenous contrast. Multiplanar CT image reconstructions and MIPs were obtained to evaluate the vascular anatomy. RADIATION DOSE REDUCTION: This exam was performed according to the departmental dose-optimization program which includes automated exposure control, adjustment of the mA and/or kV according to patient size and/or use of iterative reconstruction technique. CONTRAST:  37mL OMNIPAQUE IOHEXOL 350 MG/ML SOLN COMPARISON:  10/26/2001 FINDINGS: Cardiovascular: No filling defects in the pulmonary arteries to suggest pulmonary emboli. Mild cardiomegaly. No aortic aneurysm. Scattered aortic calcifications. Mediastinum/Nodes: No mediastinal, hilar, or axillary adenopathy. Trachea and esophagus are unremarkable. Thyroid unremarkable. Lungs/Pleura: Minimal bibasilar atelectasis or scarring. No confluent opacities or effusions. Upper Abdomen: No acute  findings Musculoskeletal: Chest wall soft tissues are unremarkable. No acute bony abnormality Review of the MIP images confirms the above findings. IMPRESSION: No evidence of pulmonary embolus. Cardiomegaly. Bibasilar atelectasis or scarring. Aortic Atherosclerosis (ICD10-I70.0). Electronically Signed   By: Rolm Baptise M.D.   On: 04/01/2022 23:36    Medications Ordered in ED Medications  iohexol (OMNIPAQUE) 350 MG/ML injection 75 mL (75 mLs Intravenous Contrast Given 04/01/22 2331)  Procedures Procedures  (including critical care time)  Medical Decision Making / ED Course  Click here for ABCD2, HEART and other calculators  Medical Decision Making Amount and/or Complexity of Data Reviewed Labs: ordered. Decision-making details documented in ED Course. Radiology: ordered and independent interpretation performed. Decision-making details documented in ED Course. ECG/medicine tests: ordered and independent interpretation performed. Decision-making details documented in ED Course.    This patient presents to the ED for concern of SOB with elevated dimer, this involves an extensive number of treatment options, and is a complaint that carries with it a high risk of complications and morbidity. The differential diagnosis includes but not limited to will rule out PE. Will assess for HF, severe anemia. Doubt PNA, PTX.  CBC without leukocytosis or anemia Metabolic panel without significant electrolyte derangements.  Stable renal function. BNP 200.  CTA negative for pulmonary emboli, pleural effusions or pulmonary edema.  No pneumonia noted.  Ambulated without desatting   Will start on low dose lasix. Return in the am for LE duplex.        Final Clinical Impression(s) / ED Diagnoses Final diagnoses:  Dyspnea on exertion   The patient appears reasonably  screened and/or stabilized for discharge and I doubt any other medical condition or other [facility] requiring further screening, evaluation, or treatment in the ED at this time. I have discussed the findings, Dx and Tx plan with the patient/family who expressed understanding and agree(s) with the plan. Discharge instructions discussed at length. The patient/family was given strict return precautions who verbalized understanding of the instructions. No further questions at time of discharge.  Disposition: Discharge  Condition: Good  ED Discharge Orders          Ordered    LE VENOUS        04/02/22 0150    furosemide (LASIX) 20 MG tablet  Daily        04/02/22 0151              Follow Up: Jonathon Jordan, MD [address]  [id number] [phone]  Call  to schedule an appointment for close follow up           This chart was dictated using voice recognition software.  Despite best efforts to proofread,  errors can occur which can change the documentation meaning.    Fatima Blank, MD 04/02/22 [phone]

## 2022-04-16 ENCOUNTER — Ambulatory Visit: Payer: Medicare Other | Admitting: Podiatry

## 2022-04-27 ENCOUNTER — Ambulatory Visit (INDEPENDENT_AMBULATORY_CARE_PROVIDER_SITE_OTHER): Payer: Medicare Other | Admitting: Podiatry

## 2022-04-27 ENCOUNTER — Encounter: Payer: Self-pay | Admitting: Podiatry

## 2022-04-27 VITALS — BP 150/60

## 2022-04-27 DIAGNOSIS — B351 Tinea unguium: Secondary | ICD-10-CM | POA: Diagnosis not present

## 2022-04-27 DIAGNOSIS — M79674 Pain in right toe(s): Secondary | ICD-10-CM

## 2022-04-27 DIAGNOSIS — Q828 Other specified congenital malformations of skin: Secondary | ICD-10-CM

## 2022-04-27 DIAGNOSIS — M79675 Pain in left toe(s): Secondary | ICD-10-CM | POA: Diagnosis not present

## 2022-04-27 DIAGNOSIS — E1151 Type 2 diabetes mellitus with diabetic peripheral angiopathy without gangrene: Secondary | ICD-10-CM | POA: Diagnosis not present

## 2022-04-27 NOTE — Progress Notes (Signed)
  Subjective:  Patient ID: Thomas Joseph, male    DOB: August 12, 1933,  MRN: [id number]  Thomas Joseph presents to clinic today for at risk foot care. Pt has h/o NIDDM with PAD and painful thick toenails that are difficult to trim. Pain interferes with ambulation. Aggravating factors include wearing enclosed shoe gear. Pain is relieved with periodic professional debridement.  Chief Complaint  Patient presents with   Nail Problem    [facility] BS-93 yesterday A1C-6.7 PCP-[facility] PCP VST-11/2021   New problem(s): None.   PCP is Mila Palmer, MD.  Allergies  Allergen Reactions   Atorvastatin Other (See Comments)    Muscle pain  Other Reaction(s): shoulder/arm pain   Hydrochlorothiazide Other (See Comments)    Reaction not recalled, believes it just made him urinate very frequently  Other Reaction(s): ed   Metoprolol     Other reaction(s): high BP, urinary symptoms  Other Reaction(s): high BP, urinary symptoms   Tizanidine Hcl Other (See Comments)    Other Reaction(s): groggy, sleepy, hallucinations   Sildenafil Palpitations    Other reaction(s): TACHYCARDIA  Other Reaction(s): TACHYCARDIA   Viagra [Sildenafil Citrate] Palpitations    Review of Systems: Negative except as noted in the HPI.  Objective:  Vitals:   04/27/22 1035  BP: (!) 150/60   Thomas Joseph is a pleasant 89 y.o. male WD, WN in NAD. AAO x 3.  Vascular Examination: CFT <3 seconds b/l. DP pulses faintly palpable b/l. PT pulses nonpalpable b/l. Digital hair absent. Skin temperature gradient warm to warm b/l. No pain with calf compression. No ischemia or gangrene. No cyanosis or clubbing noted b/l. +1 pitting edema bilateral ankles.   Neurological Examination: Pt has subjective symptoms of neuropathy. Protective sensation intact 5/5 intact bilaterally with 10g monofilament b/l. Vibratory sensation diminished b/l.  Dermatological Examination: Pedal skin thin, shiny and atrophic b/l LE. No open wounds  b/l LE. No interdigital macerations noted b/l LE.   Toenails bilateral great toes and 3-5 bilaterally elongated, discolored, dystrophic, thickened, and crumbly with subungual debris and tenderness to dorsal palpation. Anonychia noted bilateral 2nd toes. Nailbed(s) epithelialized.    Resolved porokeratotic lesion(s) submet head 5 left foot. No erythema, no edema, no drainage, no fluctuance.  Musculoskeletal Examination: Muscle strength 5/5 to b/l LE. HAV with bunion deformity noted b/l LE.  Radiographs: None Assessment/Plan: 1. Pain due to onychomycosis of toenails of both feet   2. Type II diabetes mellitus with peripheral circulatory disorder     -Consent given for treatment as described below: -Examined patient. -Continue foot and shoe inspections daily. Monitor blood glucose per PCP/Endocrinologist's recommendations. -Toenails were debrided in length and girth bilateral great toes and 3-5 bilaterally with sterile nail nippers and dremel without iatrogenic bleeding.  -Patient/POA to call should there be question/concern in the interim.   Return in about 3 months (around 07/27/2022).  [name], DPM

## 2022-05-17 IMAGING — DX DG CHEST 2V
2 series · 2 of 2 positions shown · non-contrast
Comparison: Chest x-ray 04/11/2019.

CLINICAL DATA: Chest pain.

EXAM:
CHEST - 2 VIEW

[w chest pa]
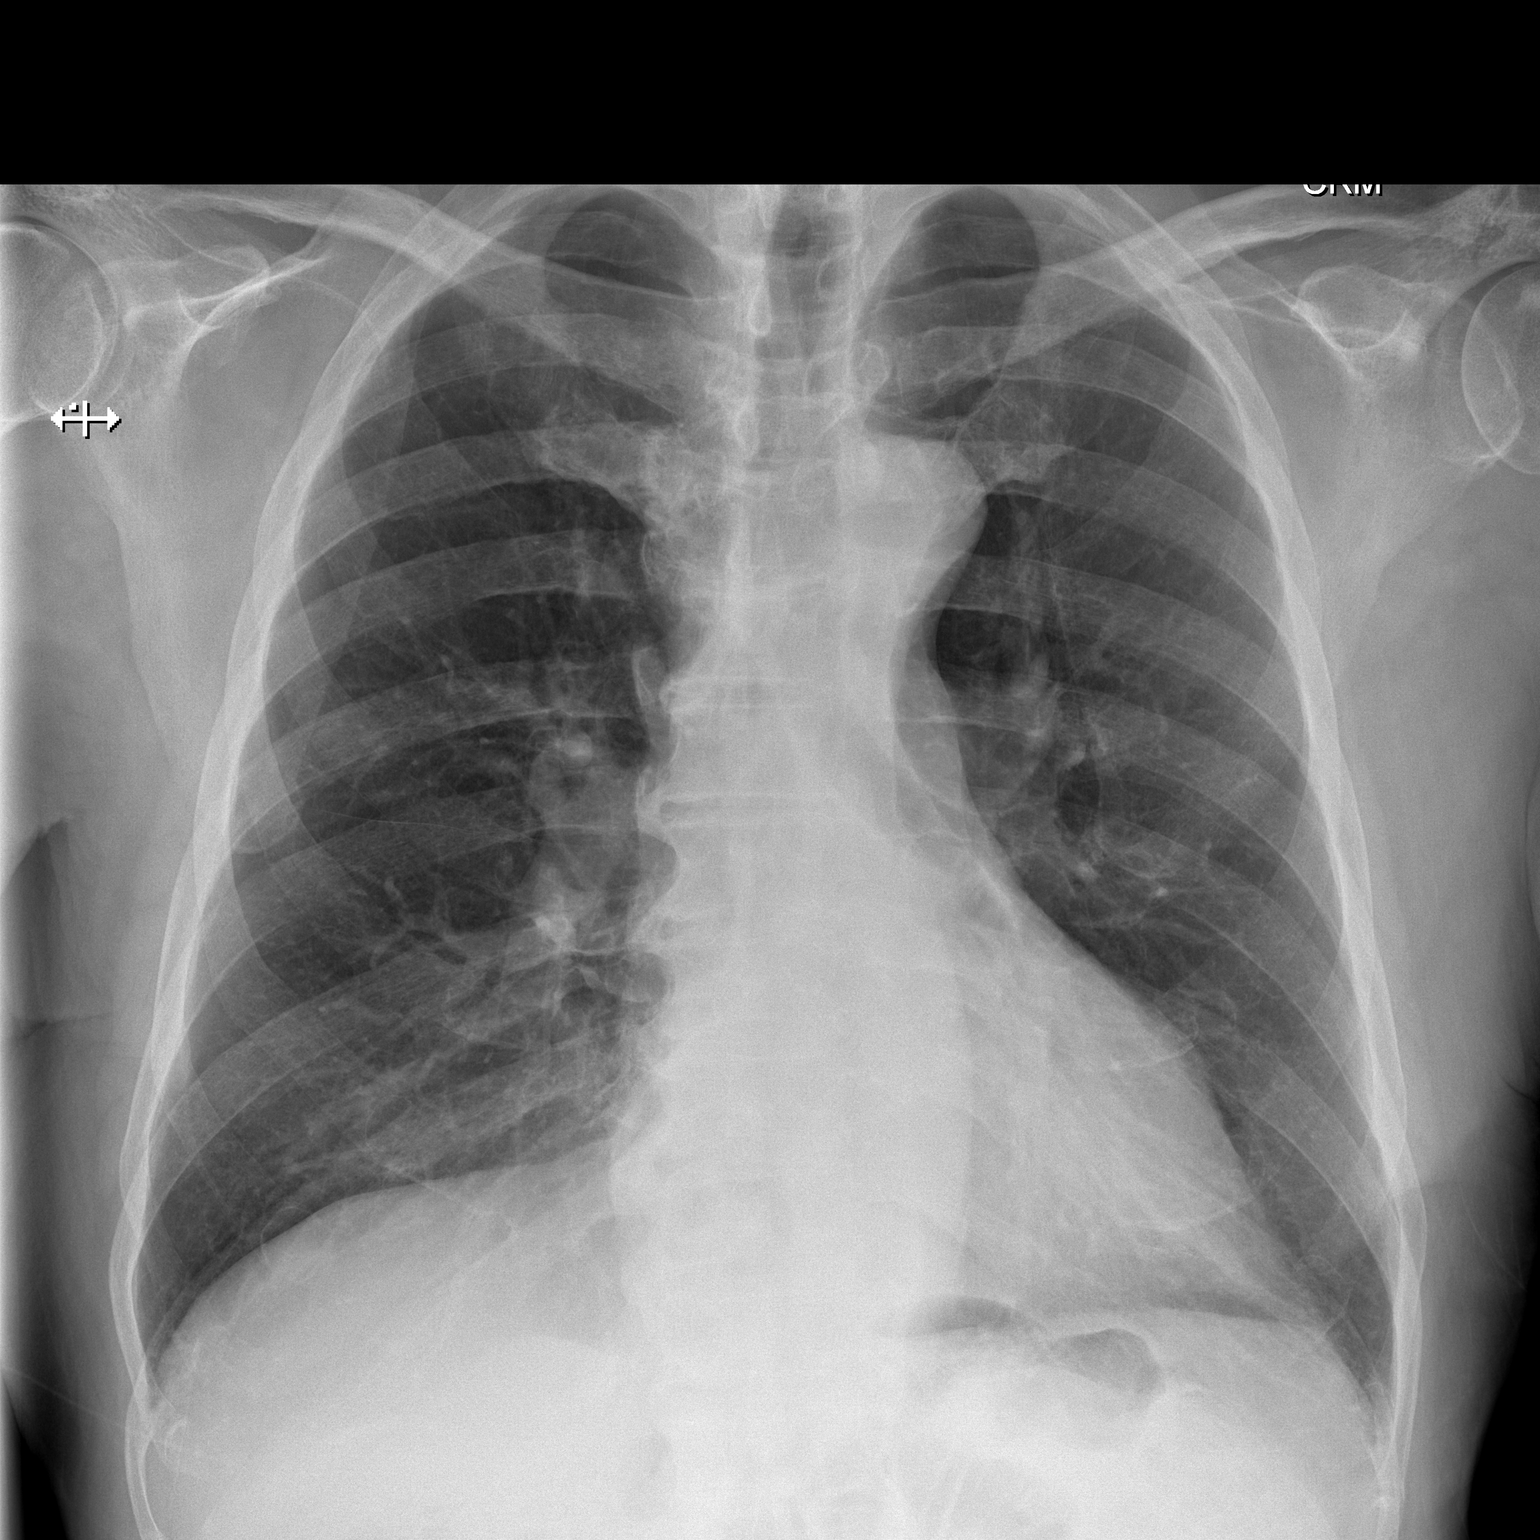

[w chest lat]
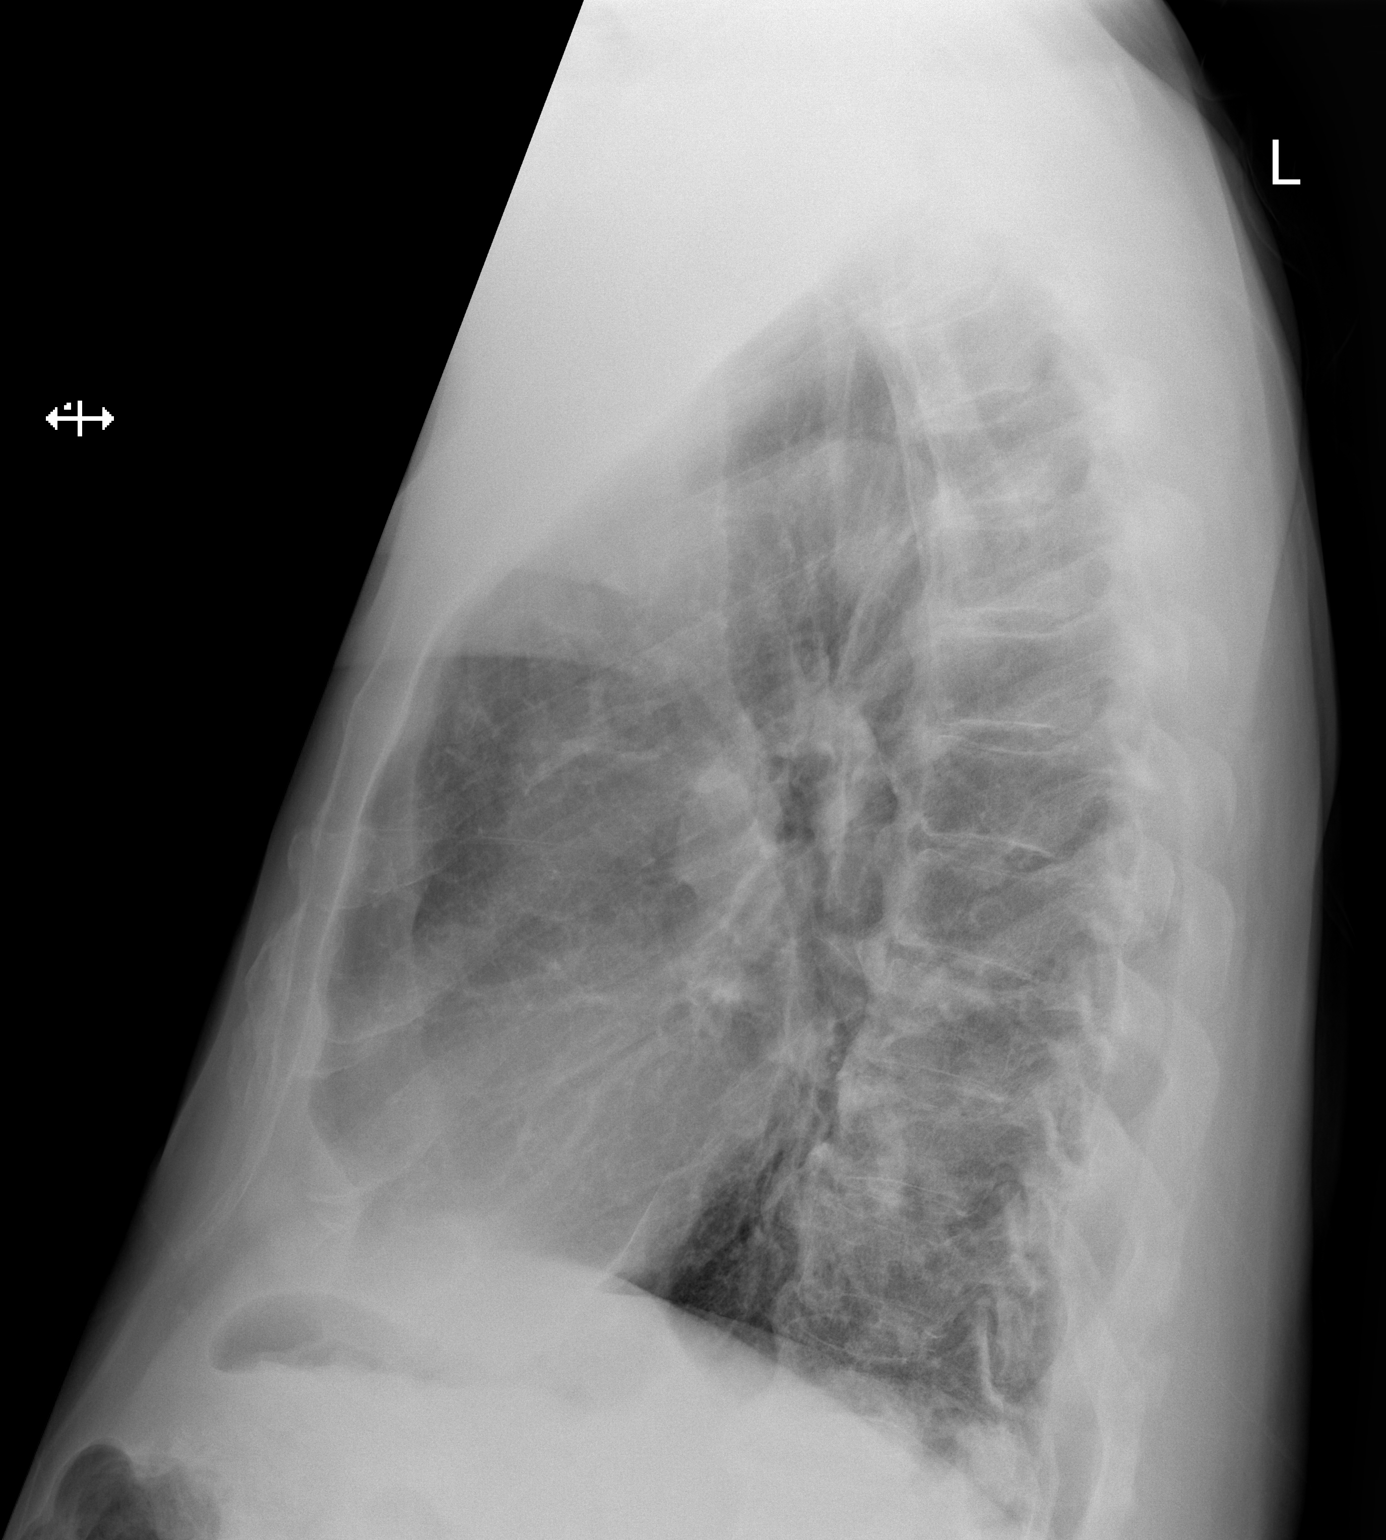

[2 of 2 positions shown; findings below may reference images not displayed]

FINDINGS: The heart size and mediastinal contours are within normal limits.
Both lungs are clear. The visualized skeletal structures are
unremarkable.
IMPRESSION: No active cardiopulmonary disease.

## 2022-05-17 IMAGING — CT CT CERVICAL SPINE W/O CM
3 of 4 series · 13 of 33 positions shown, 16 images · non-contrast
Comparison: None.

CLINICAL DATA: Neck trauma (Age >= 65y). No contrast Pt c/o left
shoulder pain, left arm pain, SOB. Pain intermittent depending on
activity. Onset last [REDACTED].

EXAM:
CT CERVICAL SPINE WITHOUT CONTRAST
TECHNIQUE: Multidetector CT imaging of the cervical spine was performed without
intravenous contrast. Multiplanar CT image reconstructions were also
generated.

[Series 6: c_spine 2.0 st · axial · 0.37mm/px · z∈[-180,-36]mm · 5 of 108 slices shown, 7 images]
[im 18/108  soft-tissue]
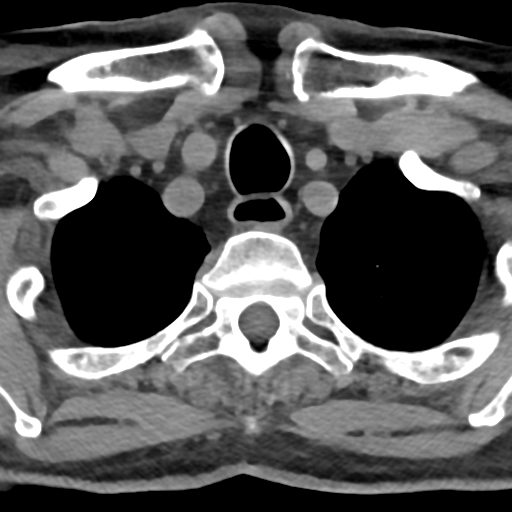
[im 18/108  bone]
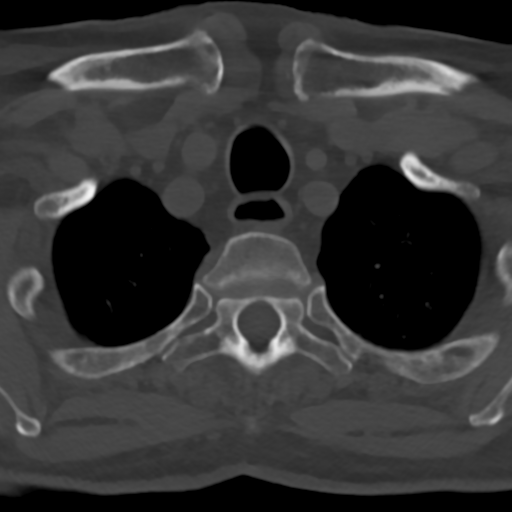
[im 36/108  bone]
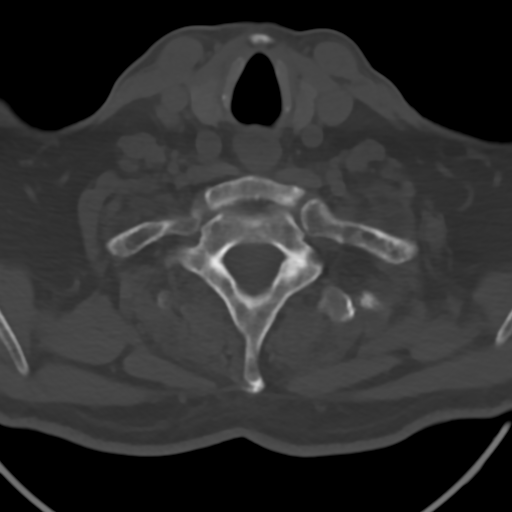
[im 54/108  bone]
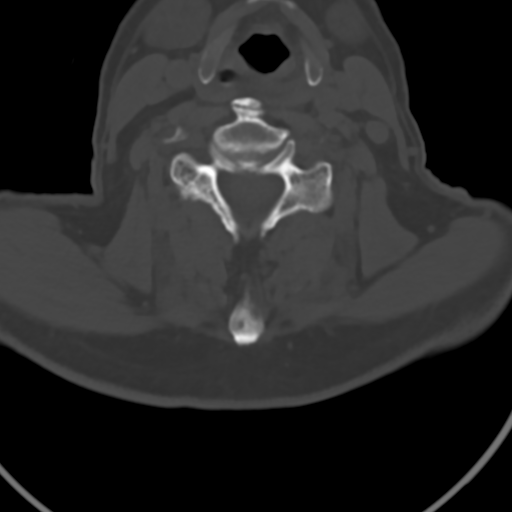
[im 72/108  bone]
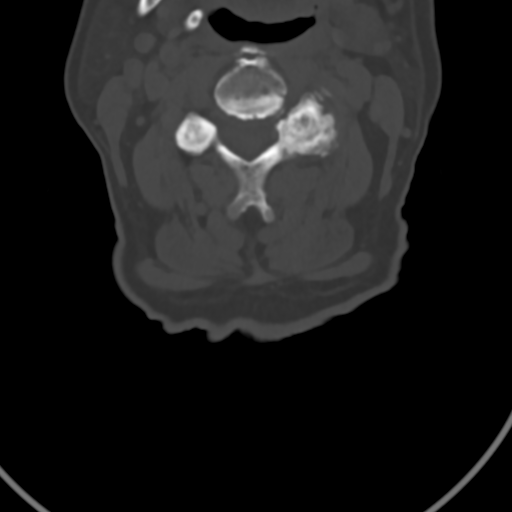
[im 90/108  soft-tissue]
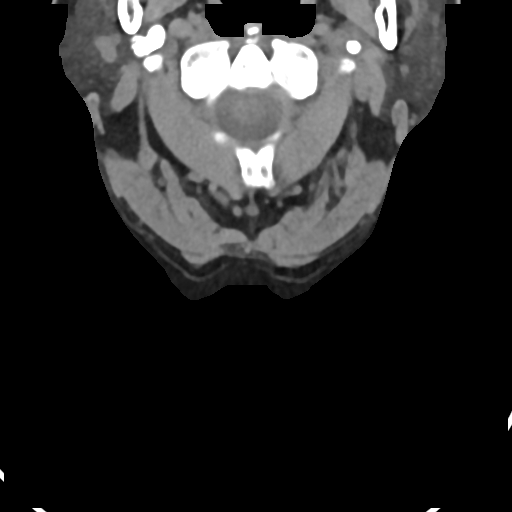
[im 90/108  bone]
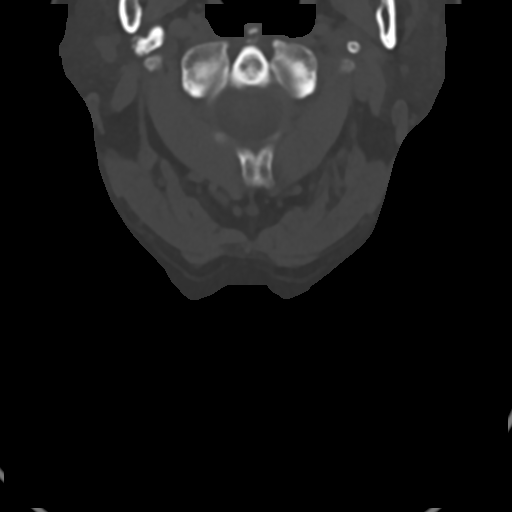

[Series 8: c_spine 2.0 sag bone · sagittal · 0.34mm/px · 5 of 61 slices shown, 6 images]
[im 21/61  bone]
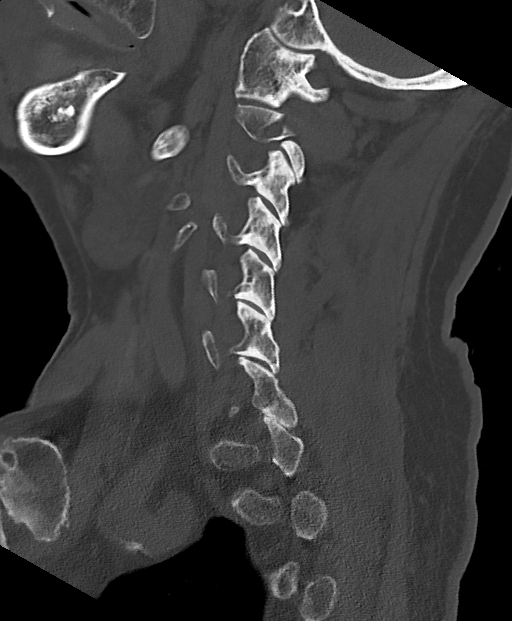
[im 26/61  bone]
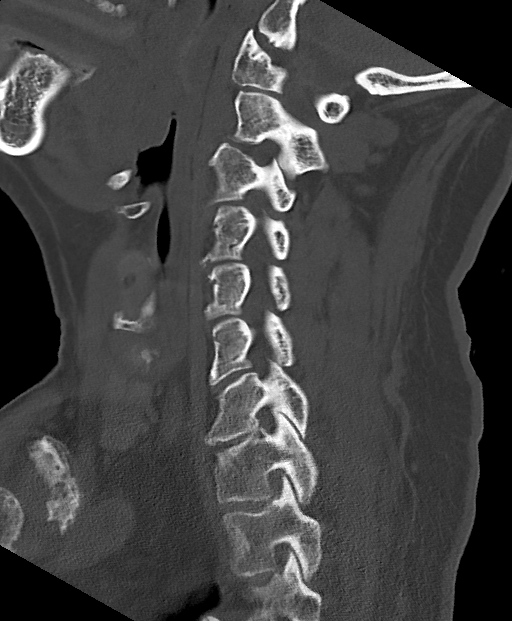
[im 31/61  soft-tissue]
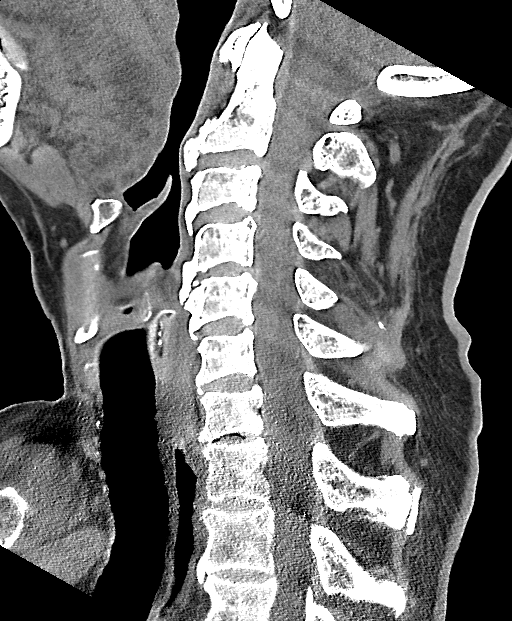
[im 31/61  bone]
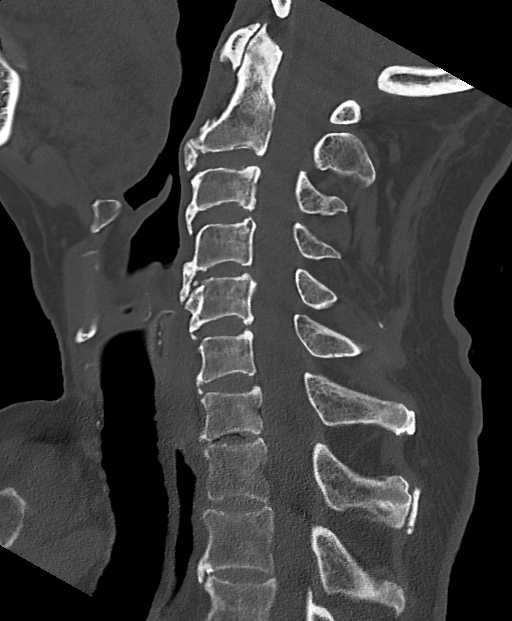
[im 36/61  bone]
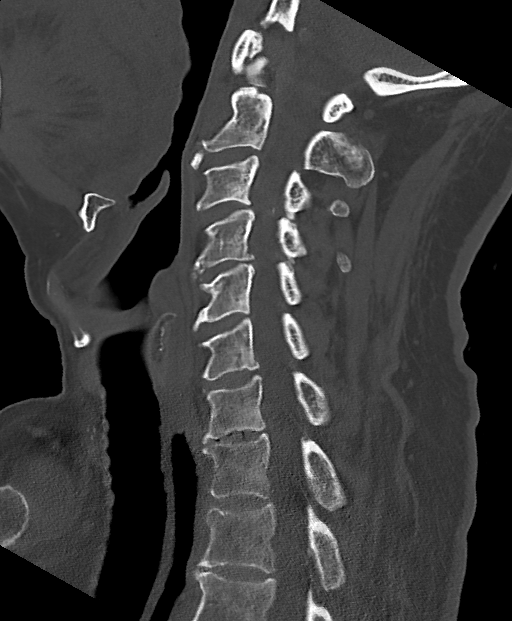
[im 41/61  bone]
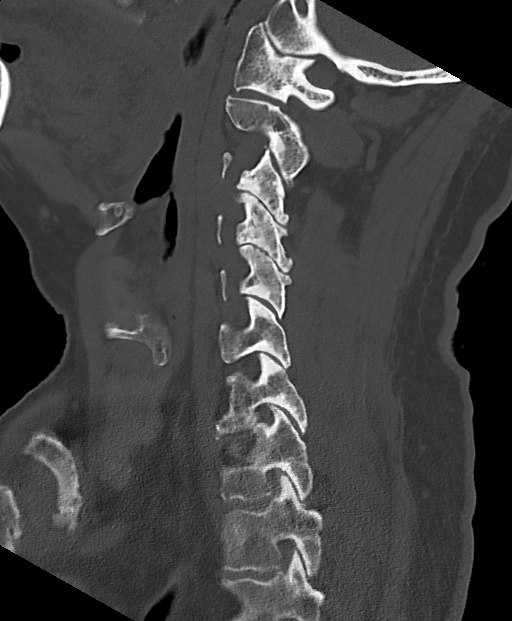

[Series 9: c_spine 2.0 cor bone · coronal · 0.31mm/px · 3 of 75 slices shown]
[im 24/75  bone]
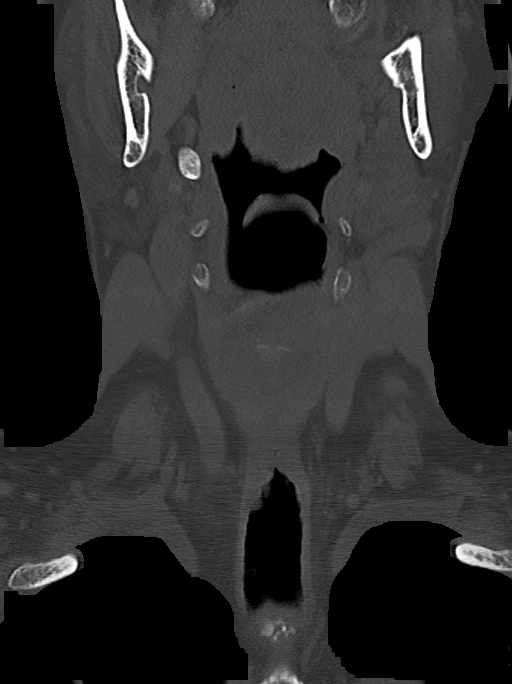
[im 33/75  bone]
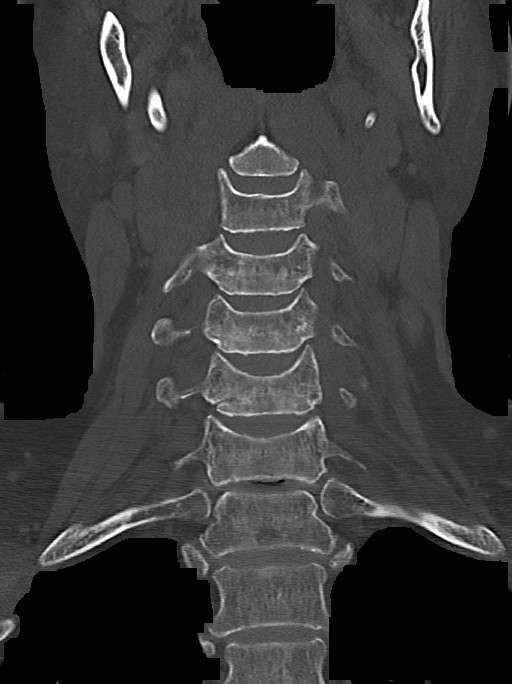
[im 42/75  bone]
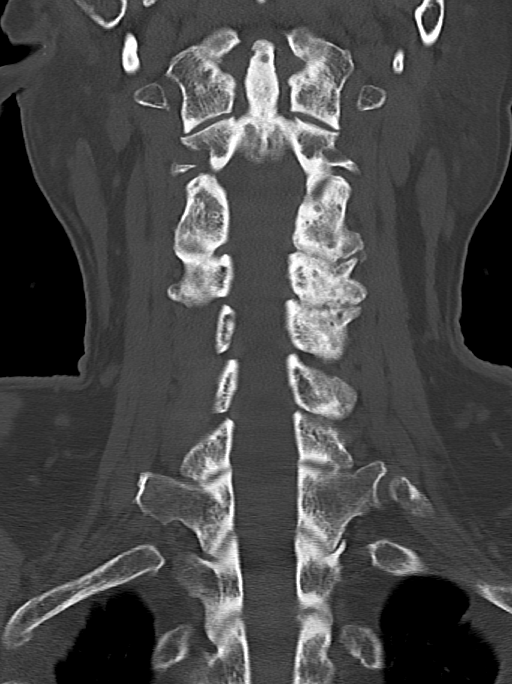

[13 of 33 positions shown; findings below may reference images not displayed]

FINDINGS: Alignment: Normal.

Skull base and vertebrae: Multilevel degenerative changes of the
spine. Associated severe C3-C4 and C4-C5 left osseous neural
foraminal stenosis. No severe osseous central canal stenosis. No
acute fracture. No aggressive appearing focal osseous lesion or
focal pathologic process.

Soft tissues and spinal canal: No prevertebral fluid or swelling. No
visible canal hematoma.

Upper chest: Unremarkable.

Other: None.
IMPRESSION: 1. No acute displaced fracture or traumatic listhesis of the
cervical spine.
2. Multilevel degenerative changes of the spine leading to severe
C3-C4 and C4-C5 left osseous neural foraminal stenosis.

## 2022-06-02 DIAGNOSIS — R0602 Shortness of breath: Secondary | ICD-10-CM | POA: Diagnosis not present

## 2022-06-02 DIAGNOSIS — I480 Paroxysmal atrial fibrillation: Secondary | ICD-10-CM | POA: Diagnosis not present

## 2022-06-02 DIAGNOSIS — M353 Polymyalgia rheumatica: Secondary | ICD-10-CM | POA: Diagnosis not present

## 2022-06-02 DIAGNOSIS — N2581 Secondary hyperparathyroidism of renal origin: Secondary | ICD-10-CM | POA: Diagnosis not present

## 2022-06-02 DIAGNOSIS — I771 Stricture of artery: Secondary | ICD-10-CM | POA: Diagnosis not present

## 2022-06-02 DIAGNOSIS — E1122 Type 2 diabetes mellitus with diabetic chronic kidney disease: Secondary | ICD-10-CM | POA: Diagnosis not present

## 2022-06-02 DIAGNOSIS — N1831 Chronic kidney disease, stage 3a: Secondary | ICD-10-CM | POA: Diagnosis not present

## 2022-06-09 NOTE — Progress Notes (Unsigned)
Cardiology Office Note:   Date:  06/10/2022  NAME:  Thomas Joseph    MRN: [id number] DOB:  1933-01-16   PCP:  Mila Palmer, MD  Cardiologist:  None  Electrophysiologist:  None   Referring MD: Mila Palmer, MD   Chief Complaint  Patient presents with   Shortness of Breath         History of Present Illness:   Thomas Joseph is a 89 y.o. male with a hx of pAF, LVH, HTN, DM, HLD, GI bleed who is being seen today for the evaluation of SOB at the request of Mila Palmer, MD. he reports he was short of breath for 2 years.  He was admitted to the hospital in 2022 with profound GI bleed.  He did require 12 units of blood transfusion.  He has on NT anticoagulation.  He also has a history of DVT PE.  He has an IVC filter in place.  Decision was made to come off anticoagulation in the setting of massive GI bleed.  There is also a remote history of paroxysmal A-fib.  No recurrence from what I can tell.  No anticoagulation.  He did go to the cancer center for iron deficiency.  His hemoglobin is now 12.4.  He reports he is working with physical therapy and symptoms have improved but when he stopped working with him his symptoms of shortness of breath do recur.  No chest tightness or chest pressure.  Symptoms are strictly exertional shortness of breath and low energy.  He has never had a heart attack or stroke.  He is diabetic.  He has CKD that is controlled.  He was seen in the emergency room in March for symptoms.  He had a BNP of 200 but chest CT showed no evidence of pulmonary edema.  He has no volume overload on examination today.  He was given a short course of Lasix but has since stopped taking this.  He has no edema on examination and lungs are clear.  EKG shows sinus bradycardia with no acute changes.  Blood pressure is well-controlled.  Diabetes control.  Lipids seem to be fairly controlled.  He is no longer taking metoprolol.  Heart rate is 59 bpm.  No fevers or chills.  No change in  medications.  Thyroid function has been stable.  Examination unremarkable.  Problem List Paroxysmal Afib  -no AC due to GI bleed (12 unit transfusions) DVT/PE DM -A1c 6.6 HTN HLD -T chol 188, HDL 63, LDL 111, TG 77 GI Bleed  CKD 3a  Past Medical History: Past Medical History:  Diagnosis Date   Anemia    Arrhythmia    BPH (benign prostatic hyperplasia)    Chronic kidney disease (CKD) stage G3a/A1, moderately decreased glomerular filtration rate (GFR) between 45-59 mL/min/1.73 square meter and albuminuria creatinine ratio less than 30 mg/g (HCC) 03/07/2020   Diabetes mellitus without complication [facility])    ED (erectile dysfunction)    Family history of adverse reaction to anesthesia    son had some problem years ago, pt unsure of what exact problem was   Hypertension    Obesity    Pneumonia     Past Surgical History: Past Surgical History:  Procedure Laterality Date    LEFT KNEE ARTHROCOPY     COLONOSCOPY WITH PROPOFOL Left 05/07/2020   Procedure: COLONOSCOPY WITH PROPOFOL;  Surgeon: Kathi Der, MD;  Location: [facility] ENDOSCOPY;  Service: Gastroenterology;  Laterality: Left;   COLONOSCOPY WITH PROPOFOL N/A 05/11/2020  Procedure: COLONOSCOPY WITH PROPOFOL;  Surgeon: Charlott Rakes, MD;  Location: [facility] ENDOSCOPY;  Service: Endoscopy;  Laterality: N/A;   ESOPHAGOGASTRODUODENOSCOPY N/A 05/06/2020   Procedure: ESOPHAGOGASTRODUODENOSCOPY (EGD);  Surgeon: Willis Modena, MD;  Location: [facility] ENDOSCOPY;  Service: Endoscopy;  Laterality: N/A;   HERNIA REPAIR     IR ANGIOGRAM SELECTIVE EACH ADDITIONAL VESSEL  05/11/2020   IR ANGIOGRAM VISCERAL SELECTIVE  05/11/2020   IR EMBO ART  VEN HEMORR LYMPH EXTRAV  INC GUIDE ROADMAPPING  05/11/2020   IR IVC FILTER PLMT / S&I /IMG GUID/MOD SED  05/11/2020   IR RADIOLOGIST EVAL & MGMT  08/27/2020   IR RADIOLOGIST EVAL & MGMT  02/26/2021   IR US GUIDE VASC ACCESS RIGHT  05/11/2020   IR VENOGRAM RENAL BI  05/11/2020   TRANSURETHRAL RESECTION  OF PROSTATE N/A 12/26/2014   Procedure: TRANSURETHRAL RESECTION OF THE PROSTATE;  Surgeon: Malen Gauze, MD;  Location: [facility] ORS;  Service: Urology;  Laterality: N/A;    Current Medications: Current Meds  Medication Sig   amLODipine (NORVASC) 10 MG tablet Take 10 mg by mouth daily.   amLODipine (NORVASC) 10 MG tablet 1 tablet   Blood Pressure Monitor DEVI    cetirizine (ZYRTEC ALLERGY) 10 MG tablet 1 tablet as needed   docusate sodium (COLACE) 100 MG capsule 1 capsule   latanoprost (XALATAN) 0.005 % ophthalmic solution 1 drop into both eyes at bedtime   lisinopril-hydrochlorothiazide (ZESTORETIC) 20-12.5 MG tablet Take 1 tablet by mouth daily.   metFORMIN (GLUCOPHAGE-XR) 500 MG 24 hr tablet Take 2 tablets by mouth daily.   sulfamethoxazole-trimethoprim (BACTRIM DS) 800-160 MG tablet Take 1 tablet by mouth 2 (two) times daily.   vitamin B-12 (CYANOCOBALAMIN) 1000 MCG tablet 1 tablet     Allergies:    Atorvastatin, Hydrochlorothiazide, Metoprolol, Tizanidine hcl, Sildenafil, and Viagra [sildenafil citrate]   Social History: Social History   Socioeconomic History   Marital status: Married    Spouse name: Not on file   Number of children: 6   Years of education: Not on file   Highest education level: Not on file  Occupational History   Occupation: Production designer, theatre/television/film  Tobacco Use   Smoking status: Former    Types: Cigarettes    Quit date: 11/28/1984    Years since quitting: 37.5   Smokeless tobacco: Never  Substance and Sexual Activity   Alcohol use: Not Currently    Comment: occasional glass of wine   Drug use: No   Sexual activity: Not on file  Other Topics Concern   Not on file  Social History Narrative   Not on file   Social Determinants of Health   Financial Resource Strain: Not on file  Food Insecurity: No Food Insecurity (10/14/2021)   Hunger Vital Sign    Worried About Running Out of Food in the Last Year: Never true    Ran Out of Food in the Last Year:  Never true  Transportation Needs: No Transportation Needs (10/14/2021)   PRAPARE - Administrator, Civil Service (Medical): No    Lack of Transportation (Non-Medical): No  Physical Activity: Not on file  Stress: Not on file  Social Connections: Not on file     Family History: The patient's family history includes CVA in his father; Cancer in his brother; Hypertension in his father; Leukemia in his sister; Thyroid disease in his sister.  ROS:   All other ROS reviewed and negative. Pertinent positives noted in the HPI.  EKGs/Labs/Other Studies Reviewed:   The following studies were personally reviewed by me today:  EKG:  EKG is ordered today.  The ekg ordered today demonstrates sinus bradycardia heart rate 58, no acute ischemic changes or evidence of infarction, and was personally reviewed by me.   Recent Labs: 04/01/2022: B Natriuretic Peptide 210.3; BUN 28; Creatinine, Ser 1.52; Hemoglobin 12.4; Platelets 205; Potassium 3.6; Sodium 138   Recent Lipid Panel    Component Value Date/Time   TRIG 107 02/02/2019 1345    Physical Exam:   VS:  BP 138/62 (BP Location: Left Arm, Patient Position: Sitting, Cuff Size: Normal)   Ht 6' (1.829 m)   Wt 213 lb (96.6 kg)   SpO2 98%   BMI 28.89 kg/m    Wt Readings from Last 3 Encounters:  06/10/22 213 lb (96.6 kg)  04/01/22 227 lb (103 kg)  08/26/21 217 lb 9.6 oz (98.7 kg)    General: Well nourished, well developed, in no acute distress Head: Atraumatic, normal size  Eyes: PEERLA, EOMI  Neck: Supple, no JVD Endocrine: No thryomegaly Cardiac: Normal S1, S2; RRR; no murmurs, rubs, or gallops Lungs: Clear to auscultation bilaterally, no wheezing, rhonchi or rales  Abd: Soft, nontender, no hepatomegaly  Ext: No edema, pulses 2+ Musculoskeletal: No deformities, BUE and BLE strength normal and equal Skin: Warm and dry, no rashes   Neuro: Alert and oriented to person, place, time, and situation, CNII-XII grossly intact, no  focal deficits  Psych: Normal mood and affect   ASSESSMENT:   Thomas Joseph is a 89 y.o. male who presents for the following: 1. SOB (shortness of breath) on exertion   2. Paroxysmal atrial fibrillation (HCC)   3. Renovascular hypertension   4. Mixed hyperlipidemia     PLAN:   1. SOB (shortness of breath) on exertion -History of severe LVH and shortness of breath.  Evaluation in the emergency room in March with mildly elevated BNP.  No signs of heart failure today.  EKG does show sinus bradycardia.  He has minimal coronary calcium.  Unclear what is going on.  Symptoms could be deconditioning.  His symptoms have been present since being in the hospital in 2022 with have not really improved.  His anemia has resolved.  I would like for him to get a Lexiscan nuclear medicine stress test and echocardiogram to include any cardiac pathology.  I see no need for Lasix as he is euvolemic on examination.  Echocardiogram will provide further delineation of pulmonary pressures and if he actually needs diuresis.  He seems to be well compensated today.  He does have bradycardia but is on no AV nodal agents.  We will continue to monitor this for now.  Could consider ETT to exclude chronotropic incompetence if above testing is negative.  Also could consider heart monitor.  2. Paroxysmal atrial fibrillation (HCC) -No recurrence not on anticoagulation due to prior GI bleed.  3. Renovascular hypertension -Well-controlled.  No change to medications.  4. Mixed hyperlipidemia -Diabetic.  Not on a statin.  Would benefit from this given diabetes however given age likely okay to hold.   Shared Decision Making/Informed Consent The risks [chest pain, shortness of breath, cardiac arrhythmias, dizziness, blood pressure fluctuations, myocardial infarction, stroke/transient ischemic attack, nausea, vomiting, allergic reaction, radiation exposure, metallic taste sensation and life-threatening complications (estimated  to be 1 in 10,000)], benefits (risk stratification, diagnosing coronary artery disease, treatment guidance) and alternatives of a nuclear stress test were discussed in detail with Mr. Machida  and he agrees to proceed.  Disposition: Return in about 3 months (around 09/10/2022).  Medication Adjustments/Labs and Tests Ordered: Current medicines are reviewed at length with the patient today.  Concerns regarding medicines are outlined above.  Orders Placed This Encounter  Procedures   Cardiac Stress Test: Informed Consent Details: Physician/Practitioner Attestation; Transcribe to consent form and obtain patient signature   Cardiac Stress Test: Informed Consent Details: Physician/Practitioner Attestation; Transcribe to consent form and obtain patient signature   MYOCARDIAL PERFUSION IMAGING   EKG 12-Lead   ECHOCARDIOGRAM COMPLETE   No orders of the defined types were placed in this encounter.   Patient Instructions  Medication Instructions:  The current medical regimen is effective;  continue present plan and medications.  *If you need a refill on your cardiac medications before your next appointment, please call your pharmacy*   Testing/Procedures: Your physician has requested that you have a lexiscan myoview. For further information please visit [email or web address] Please follow instruction sheet, as given.  Echocardiogram - Your physician has requested that you have an echocardiogram. Echocardiography is a painless test that uses sound waves to create images of your heart. It provides your doctor with information about the size and shape of your heart and how well your heart's chambers and valves are working. This procedure takes approximately one hour. There are no restrictions for this procedure.    Follow-Up: At [facility], you and your health needs are our priority.  As part of our continuing mission to provide you with exceptional heart care, we have created designated  Provider Care Teams.  These Care Teams include your primary Cardiologist (physician) and Advanced Practice Providers (APPs -  Physician Assistants and Nurse Practitioners) who all work together to provide you with the care you need, when you need it.  We recommend signing up for the patient portal called "MyChart".  Sign up information is provided on this After Visit Summary.  MyChart is used to connect with patients for Virtual Visits (Telemedicine).  Patients are able to view lab/test results, encounter notes, upcoming appointments, etc.  Non-urgent messages can be sent to your provider as well.   To learn more about what you can do with MyChart, go to [email or web address].    Your next appointment:   3 month(s)  Provider:   Lennie Odor, MD       Signed, Lenna Gilford. Flora Lipps, MD, [facility]  [facility]  [address] [place], [place] [address] [phone]  06/10/2022 2:30 PM

## 2022-06-10 ENCOUNTER — Encounter: Payer: Self-pay | Admitting: Cardiovascular Disease

## 2022-06-10 ENCOUNTER — Ambulatory Visit: Payer: Medicare Other | Attending: Cardiovascular Disease | Admitting: Cardiovascular Disease

## 2022-06-10 VITALS — BP 138/62 | Ht 72.0 in | Wt 213.0 lb

## 2022-06-10 DIAGNOSIS — E782 Mixed hyperlipidemia: Secondary | ICD-10-CM | POA: Diagnosis not present

## 2022-06-10 DIAGNOSIS — R0602 Shortness of breath: Secondary | ICD-10-CM

## 2022-06-10 DIAGNOSIS — I150 Renovascular hypertension: Secondary | ICD-10-CM | POA: Diagnosis not present

## 2022-06-10 DIAGNOSIS — I480 Paroxysmal atrial fibrillation: Secondary | ICD-10-CM

## 2022-06-10 NOTE — Patient Instructions (Signed)
Medication Instructions:  The current medical regimen is effective;  continue present plan and medications.  *If you need a refill on your cardiac medications before your next appointment, please call your pharmacy*   Testing/Procedures: Your physician has requested that you have a lexiscan myoview. For further information please visit [email or web address] Please follow instruction sheet, as given.  Echocardiogram - Your physician has requested that you have an echocardiogram. Echocardiography is a painless test that uses sound waves to create images of your heart. It provides your doctor with information about the size and shape of your heart and how well your heart's chambers and valves are working. This procedure takes approximately one hour. There are no restrictions for this procedure.    Follow-Up: At [facility], you and your health needs are our priority.  As part of our continuing mission to provide you with exceptional heart care, we have created designated Provider Care Teams.  These Care Teams include your primary Cardiologist (physician) and Advanced Practice Providers (APPs -  Physician Assistants and Nurse Practitioners) who all work together to provide you with the care you need, when you need it.  We recommend signing up for the patient portal called "MyChart".  Sign up information is provided on this After Visit Summary.  MyChart is used to connect with patients for Virtual Visits (Telemedicine).  Patients are able to view lab/test results, encounter notes, upcoming appointments, etc.  Non-urgent messages can be sent to your provider as well.   To learn more about what you can do with MyChart, go to [email or web address].    Your next appointment:   3 month(s)  Provider:   Lennie Odor, MD

## 2022-06-17 ENCOUNTER — Telehealth (HOSPITAL_COMMUNITY): Payer: Self-pay | Admitting: *Deleted

## 2022-06-17 NOTE — Telephone Encounter (Signed)
Spoke with patient and his wife and they were both given detailed instructions about the STRESS TEST on 06/18/22 at 10:45.

## 2022-06-18 ENCOUNTER — Ambulatory Visit (HOSPITAL_COMMUNITY): Payer: Medicare Other | Attending: Cardiovascular Disease

## 2022-06-18 DIAGNOSIS — R0602 Shortness of breath: Secondary | ICD-10-CM | POA: Insufficient documentation

## 2022-06-18 LAB — MYOCARDIAL PERFUSION IMAGING
LV dias vol: 90 mL (ref 62–150)
LV sys vol: 47 mL
Nuc Stress EF: 57 %
Peak HR: 75 {beats}/min
Rest HR: 60 {beats}/min
Rest Nuclear Isotope Dose: 10.9 mCi
SDS: 1
SRS: 0
SSS: 1
ST Depression (mm): 0 mm
Stress Nuclear Isotope Dose: 32.7 mCi
TID: 1.11

## 2022-06-18 MED ORDER — TECHNETIUM TC 99M TETROFOSMIN IV KIT
10.9000 | PACK | Freq: Once | INTRAVENOUS | Status: AC | PRN
  Administered 2022-06-18: 10.9 via INTRAVENOUS

## 2022-06-18 MED ORDER — TECHNETIUM TC 99M TETROFOSMIN IV KIT
32.7000 | PACK | Freq: Once | INTRAVENOUS | Status: AC | PRN
  Administered 2022-06-18: 32.7 via INTRAVENOUS

## 2022-06-18 MED ORDER — REGADENOSON 0.4 MG/5ML IV SOLN
0.4000 mg | Freq: Once | INTRAVENOUS | Status: AC
  Administered 2022-06-18: 0.4 mg via INTRAVENOUS

## 2022-07-06 DIAGNOSIS — E119 Type 2 diabetes mellitus without complications: Secondary | ICD-10-CM | POA: Diagnosis not present

## 2022-07-09 ENCOUNTER — Encounter (HOSPITAL_COMMUNITY): Payer: Self-pay

## 2022-07-09 ENCOUNTER — Emergency Department (HOSPITAL_COMMUNITY): Payer: Medicare Other

## 2022-07-09 ENCOUNTER — Emergency Department (HOSPITAL_COMMUNITY)
Admission: EM | Admit: 2022-07-09 | Discharge: 2022-07-09 | Disposition: A | Discharge status: Home / Self Care | Payer: Medicare Other | Attending: Emergency Medicine | Admitting: Emergency Medicine

## 2022-07-09 DIAGNOSIS — K5900 Constipation, unspecified: Secondary | ICD-10-CM | POA: Diagnosis not present

## 2022-07-09 DIAGNOSIS — R140 Abdominal distension (gaseous): Secondary | ICD-10-CM | POA: Diagnosis not present

## 2022-07-09 DIAGNOSIS — R350 Frequency of micturition: Secondary | ICD-10-CM | POA: Insufficient documentation

## 2022-07-09 DIAGNOSIS — K5641 Fecal impaction: Secondary | ICD-10-CM | POA: Diagnosis not present

## 2022-07-09 LAB — URINALYSIS, ROUTINE W REFLEX MICROSCOPIC
Bilirubin Urine: NEGATIVE
Glucose, UA: NEGATIVE mg/dL
Hgb urine dipstick: NEGATIVE
Ketones, ur: NEGATIVE mg/dL
Leukocytes,Ua: NEGATIVE
Nitrite: NEGATIVE
Protein, ur: NEGATIVE mg/dL
Specific Gravity, Urine: 1.009 (ref 1.005–1.030)
pH: 8 (ref 5.0–8.0)

## 2022-07-09 MED ORDER — MAGNESIUM CITRATE PO SOLN
1.0000 | Freq: Once | ORAL | Status: AC
  Administered 2022-07-09: 1 via ORAL
  Filled 2022-07-09: qty 296

## 2022-07-09 NOTE — Discharge Instructions (Addendum)
You have been seen and discharged from the emergency department.  Your x-ray showed mild constipation.  Your urinalysis showed no UTI.  You are being sent home with a bottle of magnesium citrate to drink.  Drink 6 ounces of magnesium citrate tonight. If no relief in over 12 hours, you may repeat tomorrow. If no relief tomorrow you may repeat Sunday. If no bowel movement by Monday please call you PCP.  Follow-up with your primary provider for further evaluation and further care. Take home medications as prescribed. If you have any worsening symptoms, back pain, side pain, abdominal distention or further concerns for your health please return to an emergency department for further evaluation.

## 2022-07-09 NOTE — ED Provider Notes (Signed)
[place] EMERGENCY DEPARTMENT AT [facility] Provider Note   CSN: [id number] Arrival date & time: 07/09/22  7829     History  Chief Complaint  Patient presents with   Constipation    Thomas Joseph is a 89 y.o. male.  HPI   89 year old male presents emergency department concern for constipation.  Patient states that he typically has a bowel movement every 3 to 4 days however he has not had a bowel movement in the last 7 days.  He states that he feels slightly bloated and at times has the urge to go but does not pass any stool.  He is still passing gas.  He has had a normal appetite but is nauseous at times.  Denies any rectal bleeding.  He has no rectal pain/fullness.  He is urinating without difficulty. No back pain. Endorses intermittent urinary frequency without dysuria or hematuria.  No fever/chills.  Home Medications Prior to Admission medications   Medication Sig Start Date End Date Taking? Authorizing Provider  amLODipine (NORVASC) 10 MG tablet Take 10 mg by mouth daily.    [provider]  amLODipine (NORVASC) 10 MG tablet 1 tablet    [provider]  Blood Pressure Monitor DEVI  10/23/19   [provider]  cetirizine (ZYRTEC ALLERGY) 10 MG tablet 1 tablet as needed    [provider]  docusate sodium (COLACE) 100 MG capsule 1 capsule 11/24/20   [provider]  furosemide (LASIX) 20 MG tablet Take 0.5 tablets (10 mg total) by mouth daily for 10 days. 04/02/22 04/12/22  Nira Conn, MD  latanoprost (XALATAN) 0.005 % ophthalmic solution 1 drop into both eyes at bedtime    [provider]  lisinopril-hydrochlorothiazide (ZESTORETIC) 20-12.5 MG tablet Take 1 tablet by mouth daily. 02/15/19   [provider]  metFORMIN (GLUCOPHAGE-XR) 500 MG 24 hr tablet Take 2 tablets by mouth daily. 07/03/20   [provider]  sulfamethoxazole-trimethoprim (BACTRIM DS) 800-160 MG tablet Take 1 tablet by  mouth 2 (two) times daily. 06/27/20   [provider]  vitamin B-12 (CYANOCOBALAMIN) 1000 MCG tablet 1 tablet 05/20/20   [provider]  benazepril (LOTENSIN) 40 MG tablet Take 40 mg by mouth daily.  02/02/19  [provider]  zolpidem (AMBIEN) 10 MG tablet Take 10 mg by mouth at bedtime as needed for sleep.   02/02/19  [provider]      Allergies    Atorvastatin, Hydrochlorothiazide, Metoprolol, Tizanidine hcl, Sildenafil, and Viagra [sildenafil citrate]    Review of Systems   Review of Systems  Constitutional:  Positive for fatigue. Negative for appetite change, chills and fever.  Respiratory:  Negative for shortness of breath.   Cardiovascular:  Negative for chest pain.  Gastrointestinal:  Positive for abdominal distention, constipation and nausea. Negative for abdominal pain, anal bleeding, blood in stool, diarrhea, rectal pain and vomiting.  Genitourinary:  Negative for difficulty urinating and flank pain.  Musculoskeletal:  Negative for back pain.  Skin:  Negative for rash.  Neurological:  Negative for headaches.    Physical Exam Updated Vital Signs BP (!) 184/92   Pulse (!) 59   Temp 98.1 F (36.7 C) (Oral)   Resp 18   Ht 6' (1.829 m)   Wt 96.6 kg   SpO2 99%   BMI 28.88 kg/m  Physical Exam Vitals and nursing note reviewed.  Constitutional:      General: He is not in acute distress.  Appearance: Normal appearance.  HENT:     Head: Normocephalic.     Mouth/Throat:     Mouth: Mucous membranes are moist.  Cardiovascular:     Rate and Rhythm: Normal rate.  Pulmonary:     Effort: Pulmonary effort is normal. No respiratory distress.  Abdominal:     General: Bowel sounds are normal. There is no distension.     Palpations: Abdomen is soft.     Tenderness: There is no abdominal tenderness. There is no guarding or rebound.     Hernia: No hernia is present.  Skin:    General: Skin is warm.  Neurological:     Mental Status: He is  alert and oriented to person, place, and time. Mental status is at baseline.  Psychiatric:        Mood and Affect: Mood normal.     ED Results / Procedures / Treatments   Labs (all labs ordered are listed, but only abnormal results are displayed) Labs Reviewed - No data to display  EKG None  Radiology No results found.  Procedures Procedures    Medications Ordered in ED Medications - No data to display  ED Course/ Medical Decision Making/ A&P                             Medical Decision Making Amount and/or Complexity of Data Reviewed Labs: ordered. Radiology: ordered.  Risk OTC drugs.   89 year old male presents emergency department with no bowel movement for the past 7 days.  He typically goes every 3 to 4 days.  Admits to mild decrease in appetite but is otherwise been eating and drinking without difficulty.  Intermittent nausea but no vomiting.  No significant abdominal distention, no rectal pain or fullness.  Has tried over-the-counter medication without significant relief.  Also mentions urinary frequency.  X-ray of the abdomen shows moderate stool burden, no other signs of obstruction.  Abdomen is soft, benign and nontender.  In regards to the urinary frequency urinalysis shows no signs of infection/hematuria or findings consistent with kidney stone.  He has no ongoing back pain/flank pain in regards to other emergent abdominal pathology.  Will put the patient on a mild magnesium citrate regimen and discharged home.  If he has not had a bowel movement over the weekend he will call his primary doctor on Monday.  Discussed tricked return to ED instructions.  Patient at this time appears safe and stable for discharge and close outpatient follow up. Discharge plan and strict return to ED precautions discussed, patient verbalizes understanding and agreement.        Final Clinical Impression(s) / ED Diagnoses Final diagnoses:  None    Rx / DC Orders ED  Discharge Orders     None         Rozelle Logan, DO 07/09/22 1636

## 2022-07-09 NOTE — ED Triage Notes (Signed)
Pt arrived reporting no bowel movement for the last 7 days. States having abdominal soreness. Administered Fleet enema last night, no releif. Endorses headache that started last night, left side.

## 2022-07-12 ENCOUNTER — Ambulatory Visit (HOSPITAL_COMMUNITY): Payer: Medicare Other | Attending: Cardiovascular Disease

## 2022-07-12 DIAGNOSIS — R0602 Shortness of breath: Secondary | ICD-10-CM | POA: Diagnosis not present

## 2022-07-12 LAB — ECHOCARDIOGRAM COMPLETE
Area-P 1/2: 2.53 cm2
S' Lateral: 2.5 cm

## 2022-07-14 ENCOUNTER — Telehealth: Payer: Self-pay | Admitting: *Deleted

## 2022-07-14 NOTE — Telephone Encounter (Signed)
Left message of result on answer machine- per DPR. Any question may call back

## 2022-07-14 NOTE — Telephone Encounter (Signed)
-----   Message from Sande Rives, MD sent at 07/12/2022  9:45 PM EDT ----- Normal echo. My chart. -W

## 2022-07-17 DIAGNOSIS — S39011A Strain of muscle, fascia and tendon of abdomen, initial encounter: Secondary | ICD-10-CM | POA: Diagnosis not present

## 2022-07-31 DIAGNOSIS — J984 Other disorders of lung: Secondary | ICD-10-CM | POA: Diagnosis not present

## 2022-07-31 DIAGNOSIS — R109 Unspecified abdominal pain: Secondary | ICD-10-CM | POA: Diagnosis not present

## 2022-07-31 DIAGNOSIS — I700 Atherosclerosis of aorta: Secondary | ICD-10-CM | POA: Diagnosis not present

## 2022-07-31 DIAGNOSIS — K5900 Constipation, unspecified: Secondary | ICD-10-CM | POA: Diagnosis not present

## 2022-07-31 DIAGNOSIS — N3289 Other specified disorders of bladder: Secondary | ICD-10-CM | POA: Diagnosis not present

## 2022-07-31 DIAGNOSIS — Z20822 Contact with and (suspected) exposure to covid-19: Secondary | ICD-10-CM | POA: Diagnosis not present

## 2022-07-31 DIAGNOSIS — R059 Cough, unspecified: Secondary | ICD-10-CM | POA: Diagnosis not present

## 2022-07-31 DIAGNOSIS — J9811 Atelectasis: Secondary | ICD-10-CM | POA: Diagnosis not present

## 2022-07-31 DIAGNOSIS — Q453 Other congenital malformations of pancreas and pancreatic duct: Secondary | ICD-10-CM | POA: Diagnosis not present

## 2022-08-06 DIAGNOSIS — E46 Unspecified protein-calorie malnutrition: Secondary | ICD-10-CM | POA: Diagnosis not present

## 2022-08-06 DIAGNOSIS — Q6100 Congenital renal cyst, unspecified: Secondary | ICD-10-CM | POA: Diagnosis not present

## 2022-08-06 DIAGNOSIS — K5909 Other constipation: Secondary | ICD-10-CM | POA: Diagnosis not present

## 2022-08-06 DIAGNOSIS — D649 Anemia, unspecified: Secondary | ICD-10-CM | POA: Diagnosis not present

## 2022-08-06 DIAGNOSIS — M353 Polymyalgia rheumatica: Secondary | ICD-10-CM | POA: Diagnosis not present

## 2022-08-06 DIAGNOSIS — E1122 Type 2 diabetes mellitus with diabetic chronic kidney disease: Secondary | ICD-10-CM | POA: Diagnosis not present

## 2022-08-06 DIAGNOSIS — K862 Cyst of pancreas: Secondary | ICD-10-CM | POA: Diagnosis not present

## 2022-08-11 DIAGNOSIS — H02834 Dermatochalasis of left upper eyelid: Secondary | ICD-10-CM | POA: Diagnosis not present

## 2022-08-11 DIAGNOSIS — H02831 Dermatochalasis of right upper eyelid: Secondary | ICD-10-CM | POA: Diagnosis not present

## 2022-08-11 DIAGNOSIS — H401111 Primary open-angle glaucoma, right eye, mild stage: Secondary | ICD-10-CM | POA: Diagnosis not present

## 2022-08-11 DIAGNOSIS — H11133 Conjunctival pigmentations, bilateral: Secondary | ICD-10-CM | POA: Diagnosis not present

## 2022-08-11 DIAGNOSIS — H2511 Age-related nuclear cataract, right eye: Secondary | ICD-10-CM | POA: Diagnosis not present

## 2022-08-11 DIAGNOSIS — E119 Type 2 diabetes mellitus without complications: Secondary | ICD-10-CM | POA: Diagnosis not present

## 2022-08-11 DIAGNOSIS — H40053 Ocular hypertension, bilateral: Secondary | ICD-10-CM | POA: Diagnosis not present

## 2022-08-11 DIAGNOSIS — H11823 Conjunctivochalasis, bilateral: Secondary | ICD-10-CM | POA: Diagnosis not present

## 2022-08-11 DIAGNOSIS — H31092 Other chorioretinal scars, left eye: Secondary | ICD-10-CM | POA: Diagnosis not present

## 2022-09-09 DIAGNOSIS — H2511 Age-related nuclear cataract, right eye: Secondary | ICD-10-CM | POA: Diagnosis not present

## 2022-09-09 DIAGNOSIS — H401111 Primary open-angle glaucoma, right eye, mild stage: Secondary | ICD-10-CM | POA: Diagnosis not present

## 2022-09-22 ENCOUNTER — Ambulatory Visit: Payer: Medicare Other | Admitting: Podiatry

## 2022-09-23 DIAGNOSIS — H401121 Primary open-angle glaucoma, left eye, mild stage: Secondary | ICD-10-CM | POA: Diagnosis not present

## 2022-09-23 DIAGNOSIS — H2512 Age-related nuclear cataract, left eye: Secondary | ICD-10-CM | POA: Diagnosis not present

## 2022-10-25 NOTE — Progress Notes (Unsigned)
Cardiology Office Note:  .   Date:  10/26/2022  ID:  Thomas Joseph, DOB 1933-11-05, MRN [id number] PCP: Mila Palmer, MD  [facility] Providers Cardiologist:  None { History of Present Illness: .   Thomas Joseph is a 89 y.o. male with history of pAF, HTN, DM who presents for follow-up of SOB. Moderate LVH. Needs PYP.   Discussed the use of AI scribe software for clinical note transcription with the patient, who gave verbal consent to proceed.  History of Present Illness   The patient, with a history of hypertension, diabetes, and recent cataract surgery, presents with ongoing shortness of breath, particularly with exertion such as walking. He reports that he has not been exercising regularly due to recent health issues and caring for his son who has been unwell. Echo with LVH and stress test negative. He has recently started going to the [facility] for exercise. He also reports some swelling in his legs, which he notices more in the evenings. He has been taking Lasix, amlodipine, lisinopril, and hydrochlorothiazide for his hypertension. He also reports occasional leg cramps and a feeling of heaviness in his legs. He admits to not drinking enough water daily.          Problem List Paroxysmal Afib  -no AC due to GI bleed (12 unit transfusions in 2022) DVT/PE DM -A1c 6.6 HTN HLD -T chol 188, HDL 63, LDL 111, TG 77 GI Bleed  CKD 3a    ROS: All other ROS reviewed and negative. Pertinent positives noted in the HPI.     Studies Reviewed: Marland Kitchen        TTE 07/12/2022  1. Left ventricular ejection fraction, by estimation, is 60 to 65%. The  left ventricle has normal function. The left ventricle has no regional  wall motion abnormalities. There is moderate left ventricular hypertrophy.  Left ventricular diastolic  parameters are consistent with Grade I diastolic dysfunction (impaired  relaxation). Elevated left ventricular end-diastolic pressure.   2. Right ventricular  systolic function is normal. The right ventricular  size is normal.   3. The mitral valve is abnormal. No evidence of mitral valve  regurgitation. No evidence of mitral stenosis.   4. The aortic valve is tricuspid. There is moderate calcification of the  aortic valve. There is moderate thickening of the aortic valve. Aortic  valve regurgitation is not visualized. Aortic valve  sclerosis/calcification is present, without any evidence  of aortic stenosis.   5. The inferior vena cava is normal in size with greater than 50%  respiratory variability, suggesting right atrial pressure of 3 mmHg.   NM Stress 06/18/2022   The study is normal. Findings are consistent with no ischemia and no infarction. The study is low risk.   No ST deviation was noted.   LV perfusion is normal. There is no evidence of ischemia. There is no evidence of infarction.   Left ventricular function is normal. Nuclear stress EF: 57 %. The left ventricular ejection fraction is normal (55-65%). End diastolic cavity size is normal. End systolic cavity size is normal. Physical Exam:   VS:  BP 132/84   Pulse 62   Ht 6' (1.829 m)   Wt 213 lb (96.6 kg)   SpO2 96%   BMI 28.89 kg/m    Wt Readings from Last 3 Encounters:  10/26/22 213 lb (96.6 kg)  07/09/22 212 lb 15.4 oz (96.6 kg)  06/18/22 213 lb (96.6 kg)    GEN: Well nourished, well developed in  no acute distress NECK: No JVD; No carotid bruits CARDIAC: RRR, no murmurs, rubs, gallops RESPIRATORY:  Clear to auscultation without rales, wheezing or rhonchi  ABDOMEN: Soft, non-tender, non-distended EXTREMITIES:  No edema; No deformity  ASSESSMENT AND PLAN: .   Assessment and Plan    Shortness of Breath Likely due to deconditioning and dehydration. No cardiac cause identified on stress test and echocardiogram. Mild leg edema noted. -Increase daily water intake to 4-6 glasses. -Resume exercise regimen at the [facility].  Left Ventricular Hypertrophy (LVH) Moderate LVH noted  on echocardiogram with low mitral valve tissue dopplers. -Order PYP scan to rule out cardiac amyloidosis.  Hypertension Well controlled on current regimen. -Continue Amlodipine 10mg  daily, Lisinopril 20mg  daily, and Hydrochlorothiazide 12.5mg  daily.  Diabetes Mellitus Well controlled. -Continue current regimen.  Hyperlipidemia Well controlled on current regimen. -Continue current regimen.  Follow-up in 1 year.              Follow-up: Return in about 1 year (around 10/26/2023).  Time Spent with Patient: I have spent a total of 35 minutes with patient reviewing hospital notes, telemetry, EKGs, labs and examining the patient as well as establishing an assessment and plan that was discussed with the patient.  > 50% of time was spent in direct patient care.  Signed, Lenna Gilford. Flora Lipps, MD, [facility]  [facility]  [address], [address] [place], [place] [address] [phone]  1:23 PM

## 2022-10-26 ENCOUNTER — Encounter: Payer: Self-pay | Admitting: Cardiovascular Disease

## 2022-10-26 ENCOUNTER — Ambulatory Visit: Payer: Medicare Other | Attending: Cardiovascular Disease | Admitting: Cardiovascular Disease

## 2022-10-26 VITALS — BP 132/84 | HR 62 | Ht 72.0 in | Wt 213.0 lb

## 2022-10-26 DIAGNOSIS — E782 Mixed hyperlipidemia: Secondary | ICD-10-CM | POA: Diagnosis not present

## 2022-10-26 DIAGNOSIS — I150 Renovascular hypertension: Secondary | ICD-10-CM | POA: Diagnosis not present

## 2022-10-26 DIAGNOSIS — I480 Paroxysmal atrial fibrillation: Secondary | ICD-10-CM | POA: Diagnosis not present

## 2022-10-26 DIAGNOSIS — R9431 Abnormal electrocardiogram [ECG] [EKG]: Secondary | ICD-10-CM | POA: Diagnosis not present

## 2022-10-26 DIAGNOSIS — R0602 Shortness of breath: Secondary | ICD-10-CM | POA: Diagnosis not present

## 2022-10-26 DIAGNOSIS — I517 Cardiomegaly: Secondary | ICD-10-CM | POA: Diagnosis not present

## 2022-10-26 DIAGNOSIS — R5381 Other malaise: Secondary | ICD-10-CM | POA: Diagnosis not present

## 2022-10-26 NOTE — Patient Instructions (Addendum)
Medication Instructions:  No changes    *If you need a refill on your cardiac medications before your next appointment, please call your pharmacy*   Lab Work: None    If you have labs (blood work) drawn today and your tests are completely normal, you will receive your results only by: MyChart Message (if you have MyChart) OR A paper copy in the mail If you have any lab test that is abnormal or we need to change your treatment, we will call you to review the results.   Testing/Procedures: Pyrophosphate Scan You have been ordered a PYP Scan.  This is done at [facility], they will call you to schedule.  When you come for this test please plan to be there 2-3 hours.  They are located at: [address] [place], [place] [address]  .    Follow-Up: At [facility], you and your health needs are our priority.  As part of our continuing mission to provide you with exceptional heart care, we have created designated Provider Care Teams.  These Care Teams include your primary Cardiologist (physician) and Advanced Practice Providers (APPs -  Physician Assistants and Nurse Practitioners) who all work together to provide you with the care you need, when you need it.  We recommend signing up for the patient portal called "MyChart".  Sign up information is provided on this After Visit Summary.  MyChart is used to connect with patients for Virtual Visits (Telemedicine).  Patients are able to view lab/test results, encounter notes, upcoming appointments, etc.  Non-urgent messages can be sent to your provider as well.   To learn more about what you can do with MyChart, go to [email or web address].    Your next appointment:   1 year(s)  The format for your next appointment:   In Person  Provider:   Dr. Velna Ochs, MD

## 2022-11-02 ENCOUNTER — Ambulatory Visit: Payer: Medicare Other | Admitting: Podiatry

## 2022-11-02 ENCOUNTER — Encounter: Payer: Self-pay | Admitting: Podiatry

## 2022-11-02 VITALS — Ht 72.0 in | Wt 213.0 lb

## 2022-11-02 DIAGNOSIS — L84 Corns and callosities: Secondary | ICD-10-CM

## 2022-11-02 DIAGNOSIS — Q619 Cystic kidney disease, unspecified: Secondary | ICD-10-CM | POA: Insufficient documentation

## 2022-11-02 DIAGNOSIS — M79674 Pain in right toe(s): Secondary | ICD-10-CM | POA: Diagnosis not present

## 2022-11-02 DIAGNOSIS — K862 Cyst of pancreas: Secondary | ICD-10-CM | POA: Insufficient documentation

## 2022-11-02 DIAGNOSIS — B351 Tinea unguium: Secondary | ICD-10-CM | POA: Diagnosis not present

## 2022-11-02 DIAGNOSIS — M79675 Pain in left toe(s): Secondary | ICD-10-CM

## 2022-11-02 DIAGNOSIS — E1151 Type 2 diabetes mellitus with diabetic peripheral angiopathy without gangrene: Secondary | ICD-10-CM | POA: Diagnosis not present

## 2022-11-02 DIAGNOSIS — E46 Unspecified protein-calorie malnutrition: Secondary | ICD-10-CM | POA: Insufficient documentation

## 2022-11-02 NOTE — Progress Notes (Signed)
Subjective:  Patient ID: Thomas Joseph, male    DOB: [birth year],  MRN: [id number]  89 y.o. male presents with at risk foot care. Pt has h/o NIDDM with PAD and painful elongated mycotic toenails 1-5 bilaterally which are tender when wearing enclosed shoe gear. Pain is relieved with periodic professional debridement. Chief Complaint  Patient presents with   Diabetes    Pt is here for dfc, last A1C was 6.7, His last office visit was 2 months ago, Pcp is Dr Paulino Rily.     PCP: Mila Palmer, MD.  New problem(s): None.   Review of Systems: Negative except as noted in the HPI.   Allergies  Allergen Reactions   Atorvastatin Other (See Comments)    Muscle pain  Other Reaction(s): shoulder/arm pain   Hydrochlorothiazide Other (See Comments)    Reaction not recalled, believes it just made him urinate very frequently  Other Reaction(s): ed   Metoprolol     Other reaction(s): high BP, urinary symptoms  Other Reaction(s): high BP, urinary symptoms   Tizanidine Hcl Other (See Comments)    Other Reaction(s): groggy, sleepy, hallucinations   Sildenafil Palpitations    Other reaction(s): TACHYCARDIA  Other Reaction(s): TACHYCARDIA   Viagra [Sildenafil Citrate] Palpitations    Objective:  There were no vitals filed for this visit. Constitutional Patient is a pleasant 89 y.o. African American male WD, WN in NAD. AAO x 3.  Vascular Capillary fill time to digits <3 seconds.  DP pulse(s) are faintly palpable b/l lower extremities. PT pulses nonpalpable b/l. Pedal hair absent b/l. Lower extremity skin temperature gradient warm to warm b/l. No pain with calf compression b/l. No cyanosis or clubbing noted. No ischemia nor gangrene noted b/l.   Neurologic Protective sensation intact 5/5 intact bilaterally with 10g monofilament b/l. Pt has subjective symptoms of neuropathy. Vibratory sensation diminished b/l.  Dermatologic Pedal skin is thin, shiny and atrophic b/l.  No open wounds b/l lower  extremities. No interdigital macerations b/l lower extremities. Toenails  b/l great toes 3-5 b/l elongated, discolored, dystrophic, thickened, crumbly with subungual debris and tenderness to dorsal palpation. Anonychia noted left great toe and right great toe. Nailbed(s) epithelialized.  Porokeratotic lesion(s) submet head 5 left foot. No erythema, no edema, no drainage, no fluctuance.  Orthopedic: Normal muscle strength 5/5 to all lower extremity muscle groups bilaterally. HAV with bunion deformity noted b/l LE. Utilizes cane for ambulation assistance.   Last HgA1c:      No data to display         Assessment:   1. Pain due to onychomycosis of toenails of both feet   2. Callus   3. Type II diabetes mellitus with peripheral circulatory disorder [facility])    Plan:  Patient was evaluated and treated and all questions answered. Consent given for treatment as described below: -Patient to continue soft, supportive shoe gear daily. -Mycotic toenails bilateral great toes and 3-5 bilaterally were debrided in length and girth with sterile nail nippers and dremel without iatrogenic bleeding. -Porokeratotic lesion(s) submet head 5 left foot pared and enucleated with sterile currette without incident. Total number of lesions debrided=1. -Patient/POA to call should there be question/concern in the interim.  Return in about 3 months (around 02/02/2023).  [name], DPM

## 2022-11-10 ENCOUNTER — Telehealth (HOSPITAL_COMMUNITY): Payer: Self-pay | Admitting: *Deleted

## 2022-11-10 NOTE — Telephone Encounter (Signed)
Left message reminding the patient of his appointment for an amyloid study on 11/15/22 at 12:30.

## 2022-11-15 ENCOUNTER — Ambulatory Visit (HOSPITAL_COMMUNITY): Payer: Medicare Other | Attending: Cardiovascular Disease

## 2022-11-15 DIAGNOSIS — R9431 Abnormal electrocardiogram [ECG] [EKG]: Secondary | ICD-10-CM | POA: Insufficient documentation

## 2022-11-15 DIAGNOSIS — I517 Cardiomegaly: Secondary | ICD-10-CM | POA: Insufficient documentation

## 2022-11-15 LAB — MYOCARDIAL AMYLOID PLANAR & SPECT: H/CL Ratio: 1.43

## 2022-11-15 MED ORDER — TECHNETIUM TC 99M PYROPHOSPHATE
21.1000 | Freq: Once | INTRAVENOUS | Status: AC
  Administered 2022-11-15: 21.1 via INTRAVENOUS

## 2022-11-17 ENCOUNTER — Telehealth: Payer: Self-pay | Admitting: Cardiovascular Disease

## 2022-11-17 DIAGNOSIS — E854 Organ-limited amyloidosis: Secondary | ICD-10-CM

## 2022-11-17 NOTE — Telephone Encounter (Signed)
Called Mr. Thomas Joseph.  His PYP scan is concerning for cardiac amyloidosis.  We will proceed with serum immunofixation, urine immunofixation, serum kappa/light chains.  We will order [facility] genetic testing.  I have also ordered a cardiac MRI.  I suspect he has TTR cardiac amyloidosis.  Further workup is pending.  He understands this.  He will come by the office for labs. Further recommendations pending testing.   Gerri Spore T. Flora Lipps, MD, [facility]  [facility]  [address] [place], [place] [address] [phone]  2:37 PM

## 2022-11-18 ENCOUNTER — Telehealth: Payer: Self-pay | Admitting: Cardiovascular Disease

## 2022-11-18 NOTE — Telephone Encounter (Signed)
Pt's wife would like a c/b from nurse regarding pt's test results. Please advise

## 2022-11-18 NOTE — Telephone Encounter (Signed)
Called and spoke to patient's wife per Dr Marylene Buerger message. Per the wife patient did not understand the conversation with Dr Flora Lipps. She verbalized understanding of message.      Called Mr. Thomas Joseph.  His PYP scan is concerning for cardiac amyloidosis.  We will proceed with serum immunofixation, urine immunofixation, serum kappa/light chains.  We will order [facility] genetic testing.  I have also ordered a cardiac MRI.  I suspect he has TTR cardiac amyloidosis.  Further workup is pending.  He understands this.  He will come by the office for labs. Further recommendations pending testing.

## 2022-11-19 DIAGNOSIS — E854 Organ-limited amyloidosis: Secondary | ICD-10-CM | POA: Diagnosis not present

## 2022-11-19 DIAGNOSIS — I43 Cardiomyopathy in diseases classified elsewhere: Secondary | ICD-10-CM | POA: Diagnosis not present

## 2022-11-25 DIAGNOSIS — E1122 Type 2 diabetes mellitus with diabetic chronic kidney disease: Secondary | ICD-10-CM | POA: Diagnosis not present

## 2022-11-25 DIAGNOSIS — I700 Atherosclerosis of aorta: Secondary | ICD-10-CM | POA: Diagnosis not present

## 2022-11-25 DIAGNOSIS — E785 Hyperlipidemia, unspecified: Secondary | ICD-10-CM | POA: Diagnosis not present

## 2022-11-25 DIAGNOSIS — Z79899 Other long term (current) drug therapy: Secondary | ICD-10-CM | POA: Diagnosis not present

## 2022-11-25 DIAGNOSIS — Z23 Encounter for immunization: Secondary | ICD-10-CM | POA: Diagnosis not present

## 2022-11-25 DIAGNOSIS — Z0000 Encounter for general adult medical examination without abnormal findings: Secondary | ICD-10-CM | POA: Diagnosis not present

## 2022-11-25 DIAGNOSIS — I10 Essential (primary) hypertension: Secondary | ICD-10-CM | POA: Diagnosis not present

## 2022-11-28 LAB — IMMUNOFIXATION, URINE

## 2022-11-28 LAB — PE AND FLC, SERUM
A/G Ratio: 1.1 (ref 0.7–1.7)
Albumin ELP: 3.6 g/dL (ref 2.9–4.4)
Alpha 1: 0.2 g/dL (ref 0.0–0.4)
Alpha 2: 0.6 g/dL (ref 0.4–1.0)
Beta: 1 g/dL (ref 0.7–1.3)
Gamma Globulin: 1.5 g/dL (ref 0.4–1.8)
Globulin, Total: 3.3 g/dL (ref 2.2–3.9)
Ig Kappa Free Light Chain: 44.8 mg/L — ABNORMAL HIGH (ref 3.3–19.4)
Ig Lambda Free Light Chain: 28.4 mg/L — ABNORMAL HIGH (ref 5.7–26.3)
KAPPA/LAMBDA RATIO: 1.58 (ref 0.26–1.65)
M-Spike, %: 0.6 g/dL — ABNORMAL HIGH
Total Protein: 6.9 g/dL (ref 6.0–8.5)

## 2022-11-28 LAB — IMMUNOFIXATION, SERUM
IgA/Immunoglobulin A, Serum: 402 mg/dL (ref 61–437)
IgG (Immunoglobin G), Serum: 1563 mg/dL (ref 603–1613)
IgM (Immunoglobulin M), Srm: 67 mg/dL (ref 15–143)

## 2022-12-13 ENCOUNTER — Telehealth: Payer: Self-pay | Admitting: Cardiovascular Disease

## 2022-12-13 NOTE — Telephone Encounter (Signed)
Duplicate encounter.   Gerri Spore T. Flora Lipps, MD, [facility]  [facility]  [address] [place], [place] [address] [phone]  12:27 PM

## 2022-12-23 ENCOUNTER — Other Ambulatory Visit: Payer: Self-pay

## 2022-12-23 DIAGNOSIS — I43 Cardiomyopathy in diseases classified elsewhere: Secondary | ICD-10-CM

## 2022-12-24 NOTE — Progress Notes (Signed)
Received referral for patient 12/24/22, Dr Leonides Schanz consulted by referring MD. Will get appointments scheduled.

## 2022-12-27 NOTE — Progress Notes (Signed)
Cardiology Office Note:  .   Date:  12/31/2022  ID:  Thomas Joseph, DOB [birth year], MRN [id number] PCP: Mila Palmer, MD  [facility] Providers Cardiologist:  None { History of Present Illness: .   Thomas Joseph is a 89 y.o. male with history of cardiac amyloidosis who presents for follow-up.    History of Present Illness   The patient, an 89 year old male with a diagnosis of cardiac amyloidosis, presents for a follow-up visit. The patient reports intermittent episodes of feeling unwell, with symptoms including shortness of breath and occasional leg swelling. The patient's condition is currently under investigation, with a positive PYP scan but negative genetic testing. Still SOB with activity. BP elevated today. No CP.  The patient also reports a history of paroxysmal atrial fibrillation and is not currently on anticoagulation due to a previous massive GI bleed. The patient's blood pressure is managed with a combination of lisinopril and hydrochlorothiazide, and the patient has been advised to monitor his salt intake.           Problem List Cardiac amyloidosis -genetic testing negative -PYP +  -M Spike present  Paroxysmal Afib  -no AC due to GI bleed (12 unit transfusions in 2022) 3. DVT/PE 4. DM -A1c 6.6 5. HTN 6. HLD -T chol 188, HDL 63, LDL 111, TG 77 7. GI Bleed  8. CKD 3a    ROS: All other ROS reviewed and negative. Pertinent positives noted in the HPI.     Studies Reviewed: Marland Kitchen       Physical Exam:   VS:  BP (!) 156/68   Pulse 67   Ht 6' (1.829 m)   Wt 214 lb (97.1 kg)   SpO2 98%   BMI 29.02 kg/m    Wt Readings from Last 3 Encounters:  12/31/22 214 lb (97.1 kg)  11/02/22 213 lb (96.6 kg)  10/26/22 213 lb (96.6 kg)    GEN: Well nourished, well developed in no acute distress NECK: No JVD; No carotid bruits CARDIAC: RRR, no murmurs, rubs, gallops RESPIRATORY:  Clear to auscultation without rales, wheezing or rhonchi  ABDOMEN: Soft,  non-tender, non-distended EXTREMITIES:  No edema; No deformity  ASSESSMENT AND PLAN: .   Assessment and Plan    Cardiac Amyloidosis Positive PYP scan, negative genetic testing. Awaiting cardiac MRI. Patient has been evaluated by oncology and is scheduled for a bone marrow biopsy. -Continue workup with cardiac MRI on January 25, 2023. -Consult with oncology on January 04, 2023 for potential bone marrow biopsy. -Adjust medications:stop Lisinopril HCTZ, start Lisinopril 40mg  daily, and Lasix 20mg  every other day. -Check BMP and BNP today.  Diastolic Heart Failure Evidence of lower extremity edema. -Start Lasix 20mg  every other day. -Check BMP and BNP today.  Hypertension Currently on Lisinopril HCTZ and Amlodipine. -Stop Lisinopril HCTZ, start Lisinopril 40mg  daily. -Continue Amlodipine 10mg  daily.  Paroxysmal Atrial Fibrillation Not on anticoagulation due to history of massive GI bleed. -Continue current management, no anticoagulation.  Follow-up in 3 months.              Follow-up: Return in about 3 months (around 03/31/2023).  Time Spent with Patient: I have spent a total of 35 minutes caring for this patient today face to face, ordering and reviewing labs/tests, reviewing prior records/medical history, examining the patient, establishing an assessment and plan, communicating results/findings to the patient/family, and documenting in the medical record.   Signed, Lenna Gilford. Flora Lipps, MD, [facility]  [facility]  [address]  [address], [address] [place], [place] [address] [phone]  9:15 AM

## 2022-12-30 ENCOUNTER — Encounter: Payer: Self-pay | Admitting: Cardiovascular Disease

## 2022-12-31 ENCOUNTER — Ambulatory Visit: Payer: Medicare Other | Attending: Cardiovascular Disease | Admitting: Cardiovascular Disease

## 2022-12-31 ENCOUNTER — Encounter: Payer: Self-pay | Admitting: Cardiovascular Disease

## 2022-12-31 VITALS — BP 156/68 | HR 67 | Ht 72.0 in | Wt 214.0 lb

## 2022-12-31 DIAGNOSIS — I517 Cardiomegaly: Secondary | ICD-10-CM

## 2022-12-31 DIAGNOSIS — R0602 Shortness of breath: Secondary | ICD-10-CM

## 2022-12-31 DIAGNOSIS — E854 Organ-limited amyloidosis: Secondary | ICD-10-CM

## 2022-12-31 DIAGNOSIS — I150 Renovascular hypertension: Secondary | ICD-10-CM

## 2022-12-31 DIAGNOSIS — I5032 Chronic diastolic (congestive) heart failure: Secondary | ICD-10-CM

## 2022-12-31 DIAGNOSIS — I43 Cardiomyopathy in diseases classified elsewhere: Secondary | ICD-10-CM | POA: Diagnosis not present

## 2022-12-31 DIAGNOSIS — I480 Paroxysmal atrial fibrillation: Secondary | ICD-10-CM

## 2022-12-31 MED ORDER — FUROSEMIDE 20 MG PO TABS: 20.0000 mg | ORAL_TABLET | ORAL | 3 refills | Status: DC

## 2022-12-31 MED ORDER — LISINOPRIL 40 MG PO TABS: 40.0000 mg | ORAL_TABLET | Freq: Every day | ORAL | 3 refills | Status: DC

## 2022-12-31 NOTE — Patient Instructions (Signed)
Medication Instructions:  - Stop lisinopril-hydrochlorothiazide (ZESTORETIC) 20-12.5 MG tablet  - START LISINOPRIL 40mg  once a day  - Start Lasix 20mg , every other day   *If you need a refill on your cardiac medications before your next appointment, please call your pharmacy*   Lab Work: BMET B Natriuretic Peptide    If you have labs (blood work) drawn today and your tests are completely normal, you will receive your results only by: MyChart Message (if you have MyChart) OR A paper copy in the mail If you have any lab test that is abnormal or we need to change your treatment, we will call you to review the results.   Testing/Procedures: None    Follow-Up: At [facility], you and your health needs are our priority.  As part of our continuing mission to provide you with exceptional heart care, we have created designated Provider Care Teams.  These Care Teams include your primary Cardiologist (physician) and Advanced Practice Providers (APPs -  Physician Assistants and Nurse Practitioners) who all work together to provide you with the care you need, when you need it.  We recommend signing up for the patient portal called "MyChart".  Sign up information is provided on this After Visit Summary.  MyChart is used to connect with patients for Virtual Visits (Telemedicine).  Patients are able to view lab/test results, encounter notes, upcoming appointments, etc.  Non-urgent messages can be sent to your provider as well.   To learn more about what you can do with MyChart, go to [email or web address].    Your next appointment:   3 month(s)  The format for your next appointment:   In Person  Provider:   Lennie Odor, MD   Other Instructions

## 2023-01-01 LAB — BASIC METABOLIC PANEL
BUN/Creatinine Ratio: 18 (ref 10–24)
BUN: 26 mg/dL (ref 8–27)
CO2: 25 mmol/L (ref 20–29)
Calcium: 10.3 mg/dL — ABNORMAL HIGH (ref 8.6–10.2)
Chloride: 100 mmol/L (ref 96–106)
Creatinine, Ser: 1.42 mg/dL — ABNORMAL HIGH (ref 0.76–1.27)
Glucose: 122 mg/dL — ABNORMAL HIGH (ref 70–99)
Potassium: 4.3 mmol/L (ref 3.5–5.2)
Sodium: 140 mmol/L (ref 134–144)
eGFR: 48 mL/min/{1.73_m2} — ABNORMAL LOW (ref >59)

## 2023-01-01 LAB — BRAIN NATRIURETIC PEPTIDE: BNP: 208 pg/mL — ABNORMAL HIGH (ref 0.0–100.0)

## 2023-01-04 ENCOUNTER — Inpatient Hospital Stay: Payer: Medicare Other | Attending: Hematology and Oncology | Admitting: Hematology and Oncology

## 2023-01-04 ENCOUNTER — Inpatient Hospital Stay: Payer: Medicare Other

## 2023-01-04 ENCOUNTER — Telehealth: Payer: Self-pay | Admitting: Hematology and Oncology

## 2023-01-04 ENCOUNTER — Other Ambulatory Visit: Payer: Self-pay

## 2023-01-04 VITALS — BP 172/76 | HR 60 | Temp 98.0°F | Resp 13 | Wt 209.9 lb

## 2023-01-04 DIAGNOSIS — D472 Monoclonal gammopathy: Secondary | ICD-10-CM

## 2023-01-04 DIAGNOSIS — Z86711 Personal history of pulmonary embolism: Secondary | ICD-10-CM | POA: Insufficient documentation

## 2023-01-04 DIAGNOSIS — Z8249 Family history of ischemic heart disease and other diseases of the circulatory system: Secondary | ICD-10-CM | POA: Diagnosis not present

## 2023-01-04 DIAGNOSIS — N400 Enlarged prostate without lower urinary tract symptoms: Secondary | ICD-10-CM | POA: Diagnosis not present

## 2023-01-04 DIAGNOSIS — Z809 Family history of malignant neoplasm, unspecified: Secondary | ICD-10-CM | POA: Insufficient documentation

## 2023-01-04 DIAGNOSIS — E1122 Type 2 diabetes mellitus with diabetic chronic kidney disease: Secondary | ICD-10-CM | POA: Diagnosis not present

## 2023-01-04 DIAGNOSIS — N1831 Chronic kidney disease, stage 3a: Secondary | ICD-10-CM | POA: Insufficient documentation

## 2023-01-04 DIAGNOSIS — Z87891 Personal history of nicotine dependence: Secondary | ICD-10-CM | POA: Insufficient documentation

## 2023-01-04 DIAGNOSIS — Z806 Family history of leukemia: Secondary | ICD-10-CM | POA: Diagnosis not present

## 2023-01-04 DIAGNOSIS — I129 Hypertensive chronic kidney disease with stage 1 through stage 4 chronic kidney disease, or unspecified chronic kidney disease: Secondary | ICD-10-CM | POA: Insufficient documentation

## 2023-01-04 DIAGNOSIS — Z79899 Other long term (current) drug therapy: Secondary | ICD-10-CM | POA: Diagnosis not present

## 2023-01-04 DIAGNOSIS — Z823 Family history of stroke: Secondary | ICD-10-CM | POA: Diagnosis not present

## 2023-01-04 DIAGNOSIS — Z8349 Family history of other endocrine, nutritional and metabolic diseases: Secondary | ICD-10-CM | POA: Insufficient documentation

## 2023-01-04 LAB — CMP (CANCER CENTER ONLY)
ALT: 9 U/L (ref 0–44)
AST: 17 U/L (ref 15–41)
Albumin: 3.8 g/dL (ref 3.5–5.0)
Alkaline Phosphatase: 61 U/L (ref 38–126)
Anion gap: 6 (ref 5–15)
BUN: 29 mg/dL — ABNORMAL HIGH (ref 8–23)
CO2: 31 mmol/L (ref 22–32)
Calcium: 9.3 mg/dL (ref 8.9–10.3)
Chloride: 104 mmol/L (ref 98–111)
Creatinine: 1.41 mg/dL — ABNORMAL HIGH (ref 0.61–1.24)
GFR, Estimated: 48 mL/min — ABNORMAL LOW (ref >60)
Glucose, Bld: 93 mg/dL (ref 70–99)
Potassium: 4 mmol/L (ref 3.5–5.1)
Sodium: 141 mmol/L (ref 135–145)
Total Bilirubin: 0.6 mg/dL (ref <1.2)
Total Protein: 6.8 g/dL (ref 6.5–8.1)

## 2023-01-04 LAB — CBC WITH DIFFERENTIAL (CANCER CENTER ONLY)
Abs Immature Granulocytes: 0.01 10*3/uL (ref 0.00–0.07)
Basophils Absolute: 0 10*3/uL (ref 0.0–0.1)
Basophils Relative: 1 %
Eosinophils Absolute: 0.2 10*3/uL (ref 0.0–0.5)
Eosinophils Relative: 6 %
HCT: 38 % — ABNORMAL LOW (ref 39.0–52.0)
Hemoglobin: 13.1 g/dL (ref 13.0–17.0)
Immature Granulocytes: 0 %
Lymphocytes Relative: 30 %
Lymphs Abs: 1.2 10*3/uL (ref 0.7–4.0)
MCH: 31.1 pg (ref 26.0–34.0)
MCHC: 34.5 g/dL (ref 30.0–36.0)
MCV: 90.3 fL (ref 80.0–100.0)
Monocytes Absolute: 0.4 10*3/uL (ref 0.1–1.0)
Monocytes Relative: 10 %
Neutro Abs: 2.1 10*3/uL (ref 1.7–7.7)
Neutrophils Relative %: 53 %
Platelet Count: 203 10*3/uL (ref 150–400)
RBC: 4.21 MIL/uL — ABNORMAL LOW (ref 4.22–5.81)
RDW: 13.3 % (ref 11.5–15.5)
WBC Count: 4 10*3/uL (ref 4.0–10.5)
nRBC: 0 % (ref 0.0–0.2)

## 2023-01-04 LAB — LACTATE DEHYDROGENASE: LDH: 141 U/L (ref 98–192)

## 2023-01-04 NOTE — Progress Notes (Signed)
[facility] [phone]   [phone]  INITIAL CONSULT NOTE  Patient Care Team: Mila Palmer, MD as PCP - General (Family Medicine)  Hematological/Oncological History # IgG Kappa Monoclonal Gammopathy # Concern for AL Amyloidosis 11/19/2022: As part of cardiology workup patient underwent an IFE of the serum which showed an IgG kappa monoclonal protein. 01/04/2023: establish care with Dr. Leonides Schanz   CHIEF COMPLAINTS/PURPOSE OF CONSULTATION:  "Concern for AL Amyloidosis "  HISTORY OF PRESENTING ILLNESS:  Thomas Joseph 89 y.o. male with medical history significant for CKD, DM Type II, HTN, and BPH who presents for evaluation of a monoclonal gammopathy and concern for AL amyloidosis.   On review of the previous records Thomas Joseph had an IFE collected on 11/19/2022 as part of a cardiology workup due to concern for cardiomyopathy.  This labs showed an IgG kappa monoclonal protein.  There is also concern the patient has amyloidosis.  Due to concern for possible AL amyloidosis the patient was referred to hematology for further evaluation and management.  On exam today Thomas Joseph reports that he did have a pulmonary embolism back in May 2022.  This has been complicated by GI bleeding.  He reports that he was on blood thinner temporarily and that appears that this was a provoked blood clot in the setting of hospitalization.  He notes he has not had no other issues with his blood before in the past.  On further discussion he reports that his mother lived to be 72 years old and his father passed away of a stroke.  His mother had ovarian cancer at the time of passing.  His sister had multiple myeloma and he has 6 healthy children.  He is a former smoker having quit 35 years ago.  He does not drink any alcohol and previously worked as a Engineer, mining.  He works for [employer] as well as [employer].  He notes he is otherwise been at  his baseline level of health lately with no runny nose, sore throat, or cough.  He had no infectious symptoms such as fevers, chills, sweats.  A full 10 point ROS is otherwise negative.  The bulk of our discussion focused on the diagnosis of AL amyloidosis and the steps moving forward in order to confirm that diagnosis.  He voiced understanding of our findings and plan moving forward.  MEDICAL HISTORY:  Past Medical History:  Diagnosis Date   Anemia    Arrhythmia    BPH (benign prostatic hyperplasia)    Chronic kidney disease (CKD) stage G3a/A1, moderately decreased glomerular filtration rate (GFR) between 45-59 mL/min/1.73 square meter and albuminuria creatinine ratio less than 30 mg/g (HCC) 03/07/2020   Diabetes mellitus without complication [facility])    ED (erectile dysfunction)    Family history of adverse reaction to anesthesia    son had some problem years ago, pt unsure of what exact problem was   Hypertension    Obesity    Pneumonia     SURGICAL HISTORY: Past Surgical History:  Procedure Laterality Date    LEFT KNEE ARTHROCOPY     COLONOSCOPY WITH PROPOFOL Left 05/07/2020   Procedure: COLONOSCOPY WITH PROPOFOL;  Surgeon: Kathi Der, MD;  Location: [facility] ENDOSCOPY;  Service: Gastroenterology;  Laterality: Left;   COLONOSCOPY WITH PROPOFOL N/A 05/11/2020   Procedure: COLONOSCOPY WITH PROPOFOL;  Surgeon: Charlott Rakes, MD;  Location: [facility] ENDOSCOPY;  Service: Endoscopy;  Laterality: N/A;   ESOPHAGOGASTRODUODENOSCOPY N/A 05/06/2020   Procedure:  ESOPHAGOGASTRODUODENOSCOPY (EGD);  Surgeon: Willis Modena, MD;  Location: [facility] ENDOSCOPY;  Service: Endoscopy;  Laterality: N/A;   HERNIA REPAIR     IR ANGIOGRAM SELECTIVE EACH ADDITIONAL VESSEL  05/11/2020   IR ANGIOGRAM VISCERAL SELECTIVE  05/11/2020   IR EMBO ART  VEN HEMORR LYMPH EXTRAV  INC GUIDE ROADMAPPING  05/11/2020   IR IVC FILTER PLMT / S&I /IMG GUID/MOD SED  05/11/2020   IR RADIOLOGIST EVAL & MGMT  08/27/2020   IR  RADIOLOGIST EVAL & MGMT  02/26/2021   IR US GUIDE VASC ACCESS RIGHT  05/11/2020   IR VENOGRAM RENAL BI  05/11/2020   TRANSURETHRAL RESECTION OF PROSTATE N/A 12/26/2014   Procedure: TRANSURETHRAL RESECTION OF THE PROSTATE;  Surgeon: Malen Gauze, MD;  Location: [facility] ORS;  Service: Urology;  Laterality: N/A;    SOCIAL HISTORY: Social History   Socioeconomic History   Marital status: Married    Spouse name: Not on file   Number of children: 6   Years of education: Not on file   Highest education level: Not on file  Occupational History   Occupation: Production designer, theatre/television/film  Tobacco Use   Smoking status: Former    Current packs/day: 0.00    Types: Cigarettes    Quit date: 11/28/1984    Years since quitting: 38.1   Smokeless tobacco: Never  Substance and Sexual Activity   Alcohol use: Not Currently    Comment: occasional glass of wine   Drug use: No   Sexual activity: Not on file  Other Topics Concern   Not on file  Social History Narrative   Not on file   Social Drivers of Health   Financial Resource Strain: Not on file  Food Insecurity: No Food Insecurity (01/04/2023)   Hunger Vital Sign    Worried About Running Out of Food in the Last Year: Never true    Ran Out of Food in the Last Year: Never true  Transportation Needs: No Transportation Needs (01/04/2023)   PRAPARE - Administrator, Civil Service (Medical): No    Lack of Transportation (Non-Medical): No  Physical Activity: Not on file  Stress: Not on file  Social Connections: Not on file  Intimate Partner Violence: Not At Risk (01/04/2023)   Humiliation, Afraid, Rape, and Kick questionnaire    Fear of Current or Ex-Partner: No    Emotionally Abused: No    Physically Abused: No    Sexually Abused: No    FAMILY HISTORY: Family History  Problem Relation Age of Onset   CVA Father    Hypertension Father    Leukemia Sister    Thyroid disease Sister    Cancer Brother     ALLERGIES:  is allergic to  atorvastatin, hydrochlorothiazide, metoprolol, tizanidine hcl, sildenafil, and viagra [sildenafil citrate].  MEDICATIONS:  Current Outpatient Medications  Medication Sig Dispense Refill   acetaminophen (TYLENOL) 500 MG tablet Take 500 mg by mouth every 6 (six) hours as needed for moderate pain (pain score 4-6).     amLODipine (NORVASC) 10 MG tablet Take 10 mg by mouth daily.     Blood Pressure Monitor DEVI      cetirizine (ZYRTEC ALLERGY) 10 MG tablet 1 tablet as needed     docusate sodium (COLACE) 100 MG capsule 1 capsule     furosemide (LASIX) 20 MG tablet Take 1 tablet (20 mg total) by mouth every other day. 90 tablet 3   ketorolac (ACULAR) 0.5 % ophthalmic solution Place 1 drop into  the right eye 4 (four) times daily.     latanoprost (XALATAN) 0.005 % ophthalmic solution 1 drop into both eyes at bedtime     lisinopril (ZESTRIL) 40 MG tablet Take 1 tablet (40 mg total) by mouth daily. 90 tablet 3   metFORMIN (GLUCOPHAGE-XR) 500 MG 24 hr tablet Take 2 tablets by mouth daily.     Polyethylene Glycol 3350 (MIRALAX PO) Take by mouth as needed.     sulfamethoxazole-trimethoprim (BACTRIM DS) 800-160 MG tablet Take 1 tablet by mouth 2 (two) times daily.     vitamin B-12 (CYANOCOBALAMIN) 1000 MCG tablet 1 tablet     No current facility-administered medications for this visit.    REVIEW OF SYSTEMS:   Constitutional: ( - ) fevers, ( - )  chills , ( - ) night sweats Eyes: ( - ) blurriness of vision, ( - ) double vision, ( - ) watery eyes Ears, nose, mouth, throat, and face: ( - ) mucositis, ( - ) sore throat Respiratory: ( - ) cough, ( - ) dyspnea, ( - ) wheezes Cardiovascular: ( - ) palpitation, ( - ) chest discomfort, ( - ) lower extremity swelling Gastrointestinal:  ( - ) nausea, ( - ) heartburn, ( - ) change in bowel habits Skin: ( - ) abnormal skin rashes Lymphatics: ( - ) new lymphadenopathy, ( - ) easy bruising Neurological: ( - ) numbness, ( - ) tingling, ( - ) new  weaknesses Behavioral/Psych: ( - ) mood change, ( - ) new changes  All other systems were reviewed with the patient and are negative.  PHYSICAL EXAMINATION:  Vitals:   01/04/23 1122  BP: (!) 172/76  Pulse: 60  Resp: 13  Temp: 98 F (36.7 C)  SpO2: 100%   Filed Weights   01/04/23 1122  Weight: 209 lb 14.4 oz (95.2 kg)    GENERAL: well appearing elderly African-American male in NAD  SKIN: skin color, texture, turgor are normal, no rashes or significant lesions EYES: conjunctiva are pink and non-injected, sclera clear LUNGS: clear to auscultation and percussion with normal breathing effort HEART: regular rate & rhythm and no murmurs and no lower extremity edema Musculoskeletal: no cyanosis of digits and no clubbing  PSYCH: alert & oriented x 3, fluent speech NEURO: no focal motor/sensory deficits  LABORATORY DATA:  I have reviewed the data as listed    Latest Ref Rng & Units 01/04/2023   11:00 AM 04/01/2022    8:08 PM 03/11/2021    4:22 PM  CBC  WBC 4.0 - 10.5 K/uL 4.0  4.6  5.6   Hemoglobin 13.0 - 17.0 g/dL 09.8  11.9  14.7   Hematocrit 39.0 - 52.0 % 38.0  36.3  38.1   Platelets 150 - 400 K/uL 203  205  210        Latest Ref Rng & Units 01/04/2023   11:00 AM 12/31/2022    9:48 AM 11/19/2022   12:42 PM  CMP  Glucose 70 - 99 mg/dL 93  829    BUN 8 - 23 mg/dL 29  26    Creatinine 5.62 - 1.24 mg/dL 1.30  8.65    Sodium 784 - 145 mmol/L 141  140    Potassium 3.5 - 5.1 mmol/L 4.0  4.3    Chloride 98 - 111 mmol/L 104  100    CO2 22 - 32 mmol/L 31  25    Calcium 8.9 - 10.3 mg/dL 9.3  69.6    Total  Protein 6.5 - 8.1 g/dL 6.8   6.9   Total Bilirubin <1.2 mg/dL 0.6     Alkaline Phos 38 - 126 U/L 61     AST 15 - 41 U/L 17     ALT 0 - 44 U/L 9        ASSESSMENT & PLAN Thomas Joseph 89 y.o. male with medical history significant for CKD, DM Type II, HTN, and BPH who presents for evaluation of a monoclonal gammopathy and concern for AL amyloidosis.   After review of  the labs, review of the records, and discussion with the patient the patients findings are most consistent with a monoclonal gammopathy with concern for AL amyloidosis.  At this time findings are highly concerning for an AL amyloidosis.  We will conduct a full workup to include SPEP, UPEP, and bone marrow biopsy/fat pad biopsy.  Pending the results of the above studies will have the patient return to clinic for further discussion.  Of note the patient does have a cardiac MRI scheduled for January 2025.  # IgG Kappa Monoclonal Gammopathy # Concern for AL Amyloidosis --today will order an SPEP, UPEP, SFLC and beta 2 microglobulin --additionally will collect new baseline CBC, CMP, and LDH --recommend a metastatic bone survey to assess for lytic lesions --will a bone marrow biopsy and fat pad biopsy to r/o AL amyloid deposits.  Recommend these to be stained for Congo red. --RTC pending the results of the above studies.    Orders Placed This Encounter  Procedures   CBC with Differential (Cancer Center Only)    Standing Status:   Future    Number of Occurrences:   1    Expiration Date:   01/04/2024   CMP (Cancer Center only)    Standing Status:   Future    Number of Occurrences:   1    Expiration Date:   01/04/2024   Lactate dehydrogenase (LDH)    Standing Status:   Future    Number of Occurrences:   1    Expiration Date:   01/04/2024   Multiple Myeloma Panel (SPEP&IFE w/QIG)    Standing Status:   Future    Number of Occurrences:   1    Expiration Date:   01/04/2024   Kappa/lambda light chains    Standing Status:   Future    Number of Occurrences:   1    Expiration Date:   01/04/2024   Beta 2 microglobulin    Standing Status:   Future    Number of Occurrences:   1    Expiration Date:   01/04/2024    All questions were answered. The patient knows to call the clinic with any problems, questions or concerns.  A total of more than 60 minutes were spent on this encounter with  face-to-face time and non-face-to-face time, including preparing to see the patient, ordering tests and/or medications, counseling the patient and coordination of care as outlined above.   Ulysees Barns, MD Department of Hematology/Oncology [facility] at [facility] Phone: [phone] Pager: [phone] Email: [email or web address]  01/04/2023 12:02 PM

## 2023-01-05 LAB — BETA 2 MICROGLOBULIN, SERUM: Beta-2 Microglobulin: 2.7 mg/L — ABNORMAL HIGH (ref 0.6–2.4)

## 2023-01-06 ENCOUNTER — Other Ambulatory Visit: Payer: Self-pay

## 2023-01-06 DIAGNOSIS — E859 Amyloidosis, unspecified: Secondary | ICD-10-CM

## 2023-01-07 LAB — KAPPA/LAMBDA LIGHT CHAINS
Kappa free light chain: 48.3 mg/L — ABNORMAL HIGH (ref 3.3–19.4)
Kappa, lambda light chain ratio: 1.87 — ABNORMAL HIGH (ref 0.26–1.65)
Lambda free light chains: 25.8 mg/L (ref 5.7–26.3)

## 2023-01-12 LAB — MULTIPLE MYELOMA PANEL, SERUM
Albumin SerPl Elph-Mcnc: 3.6 g/dL (ref 2.9–4.4)
Albumin/Glob SerPl: 1.2 (ref 0.7–1.7)
Alpha 1: 0.2 g/dL (ref 0.0–0.4)
Alpha2 Glob SerPl Elph-Mcnc: 0.6 g/dL (ref 0.4–1.0)
B-Globulin SerPl Elph-Mcnc: 1 g/dL (ref 0.7–1.3)
Gamma Glob SerPl Elph-Mcnc: 1.2 g/dL (ref 0.4–1.8)
Globulin, Total: 3.1 g/dL (ref 2.2–3.9)
IgA: 285 mg/dL (ref 61–437)
IgG (Immunoglobin G), Serum: 833 mg/dL (ref 603–1613)
IgM (Immunoglobulin M), Srm: 89 mg/dL (ref 15–143)
M Protein SerPl Elph-Mcnc: 0.6 g/dL — ABNORMAL HIGH
Total Protein ELP: 6.7 g/dL (ref 6.0–8.5)

## 2023-01-13 ENCOUNTER — Inpatient Hospital Stay: Payer: Medicare Other | Attending: Hematology and Oncology

## 2023-01-13 ENCOUNTER — Other Ambulatory Visit: Payer: Self-pay | Admitting: *Deleted

## 2023-01-13 DIAGNOSIS — E859 Amyloidosis, unspecified: Secondary | ICD-10-CM | POA: Diagnosis not present

## 2023-01-13 DIAGNOSIS — Z79899 Other long term (current) drug therapy: Secondary | ICD-10-CM | POA: Insufficient documentation

## 2023-01-13 DIAGNOSIS — D472 Monoclonal gammopathy: Secondary | ICD-10-CM

## 2023-01-14 NOTE — Progress Notes (Signed)
 Met with patient after visit with Dr Federico, introduced myself and gave card with my name and phone number and [facility] Tracey's name and number. Explained my role and that I would be checking with him and that they can call with any questions or concerns. Will continue to follow.

## 2023-01-17 ENCOUNTER — Other Ambulatory Visit: Payer: Self-pay | Admitting: Hematology and Oncology

## 2023-01-17 DIAGNOSIS — E859 Amyloidosis, unspecified: Secondary | ICD-10-CM

## 2023-01-17 NOTE — Progress Notes (Signed)
 Thomas Joseph POUR, MD  Thomas Joseph Approved.  Fat pad biopsy need not be sent for approval, it's like a bone marrow biopsy, ok to schedule off the jump.  Fine to do both same day.  HKM       Previous Messages    ----- Message ----- From: Thomas Joseph Sent: 01/17/2023   9:39 AM EST To: Thomas Joseph; Thomas Joseph Subject: CT Biopsy                                      Procedure : CT Biopsy  --- requesting on 01/20/23 same day as BMBX at [facility] if possible .  Reason : requesting a Fat pad biopsy to r/o amyloidosis Dx: Amyloidosis, unspecified type (HCC) [E85.9 (ICD-10-CM)]  History : CT Angio Chest PE w/wo , DG abd 2 Views  Provider: Federico Norleen ONEIDA MADISON, MD  Provider contact: [phone]

## 2023-01-19 ENCOUNTER — Other Ambulatory Visit: Payer: Self-pay | Admitting: Radiology

## 2023-01-19 DIAGNOSIS — E859 Amyloidosis, unspecified: Secondary | ICD-10-CM

## 2023-01-19 LAB — UPEP/UIFE/LIGHT CHAINS/TP, 24-HR UR
% BETA, Urine: 28.3 %
ALPHA 1 URINE: 5.1 %
Albumin, U: 38.2 %
Alpha 2, Urine: 10.8 %
Free Kappa Lt Chains,Ur: 96.65 mg/L — ABNORMAL HIGH (ref 1.17–86.46)
Free Kappa/Lambda Ratio: 11.25 (ref 1.83–14.26)
Free Lambda Lt Chains,Ur: 8.59 mg/L (ref 0.27–15.21)
GAMMA GLOBULIN URINE: 17.6 %
Total Protein, Urine-Ur/day: 110 mg/(24.h) (ref 30–150)
Total Protein, Urine: 14.7 mg/dL
Total Volume: 750

## 2023-01-19 NOTE — H&P (Signed)
 Referring Physician(s): Dorsey,John T IV  Supervising Physician: Karalee Beat  Patient Status:  [facility] OP  Chief Complaint: Monoclonal gammopathy/concern for amyloidosis   Subjective: Patient familiar to IR service from visceral arteriogram with coil embolization of the distal right colic artery branch and placement of IVC filter on 05/11/2020.  He is an 89 year old male ex smoker with past medical history significant for anemia, BPH, chronic kidney disease, PAF, diastolic heart failure, diabetes, HTN, obesity, prior PE/DVT, lower GI bleed who presents now with monoclonal gammopathy and concern for AL amyloidosis/cardiac amyloidosis with positive PYP scan but negative genetic testing.  He is scheduled today for CT-guided bone marrow biopsy as well as fat pad biopsy for further evaluation.   Past Medical History:  Diagnosis Date   Anemia    Arrhythmia    BPH (benign prostatic hyperplasia)    Chronic kidney disease (CKD) stage G3a/A1, moderately decreased glomerular filtration rate (GFR) between 45-59 mL/min/1.73 square meter and albuminuria creatinine ratio less than 30 mg/g (HCC) 03/07/2020   Diabetes mellitus without complication [facility])    ED (erectile dysfunction)    Family history of adverse reaction to anesthesia    son had some problem years ago, pt unsure of what exact problem was   Hypertension    Obesity    Pneumonia    Past Surgical History:  Procedure Laterality Date    LEFT KNEE ARTHROCOPY     COLONOSCOPY WITH PROPOFOL  Left 05/07/2020   Procedure: COLONOSCOPY WITH PROPOFOL ;  Surgeon: Elicia Claw, MD;  Location: [facility] ENDOSCOPY;  Service: Gastroenterology;  Laterality: Left;   COLONOSCOPY WITH PROPOFOL  N/A 05/11/2020   Procedure: COLONOSCOPY WITH PROPOFOL ;  Surgeon: Dianna Specking, MD;  Location: [facility] ENDOSCOPY;  Service: Endoscopy;  Laterality: N/A;   ESOPHAGOGASTRODUODENOSCOPY N/A 05/06/2020   Procedure: ESOPHAGOGASTRODUODENOSCOPY (EGD);  Surgeon: Burnette Fallow, MD;  Location[facility] ENDOSCOPY;  Service: Endoscopy;  Laterality: N/A;   HERNIA REPAIR     IR ANGIOGRAM SELECTIVE EACH ADDITIONAL VESSEL  05/11/2020   IR ANGIOGRAM VISCERAL SELECTIVE  05/11/2020   IR EMBO ART  VEN HEMORR LYMPH EXTRAV  INC GUIDE ROADMAPPING  05/11/2020   IR IVC FILTER PLMT / S&I /IMG GUID/MOD SED  05/11/2020   IR RADIOLOGIST EVAL & MGMT  08/27/2020   IR RADIOLOGIST EVAL & MGMT  02/26/2021   IR US  GUIDE VASC ACCESS RIGHT  05/11/2020   IR VENOGRAM RENAL BI  05/11/2020   TRANSURETHRAL RESECTION OF PROSTATE N/A 12/26/2014   Procedure: TRANSURETHRAL RESECTION OF THE PROSTATE;  Surgeon: Belvie LITTIE Clara, MD;  Location: [facility] ORS;  Service: Urology;  Laterality: N/A;      Allergies: Atorvastatin, Hydrochlorothiazide , Metoprolol, Tizanidine hcl, Sildenafil, and Viagra [sildenafil citrate]  Medications: Prior to Admission medications   Medication Sig Start Date End Date Taking? Authorizing Provider  acetaminophen  (TYLENOL ) 500 MG tablet Take 500 mg by mouth every 6 (six) hours as needed for moderate pain (pain score 4-6).    [provider]  amLODipine  (NORVASC ) 10 MG tablet Take 10 mg by mouth daily.    [provider]  Blood Pressure Monitor DEVI  10/23/19   [provider]  cetirizine (ZYRTEC ALLERGY) 10 MG tablet 1 tablet as needed    [provider]  docusate sodium  (COLACE) 100 MG capsule 1 capsule 11/24/20   [provider]  furosemide  (LASIX ) 20 MG tablet Take 1 tablet (20 mg total) by mouth every other day. 12/31/22 03/31/23  Barbaraann Darryle Ned, MD  ketorolac  (ACULAR ) 0.5 % ophthalmic  solution Place 1 drop into the right eye 4 (four) times daily. 08/23/22   [provider]  latanoprost  (XALATAN ) 0.005 % ophthalmic solution 1 drop into both eyes at bedtime    [provider]  lisinopril  (ZESTRIL ) 40 MG tablet Take 1 tablet (40 mg total) by mouth daily. 12/31/22 03/31/23  Barbaraann Darryle Ned, MD   metFORMIN (GLUCOPHAGE-XR) 500 MG 24 hr tablet Take 2 tablets by mouth daily. 07/03/20   [provider]  Polyethylene Glycol 3350  (MIRALAX  PO) Take by mouth as needed.    [provider]  sulfamethoxazole-trimethoprim (BACTRIM DS) 800-160 MG tablet Take 1 tablet by mouth 2 (two) times daily. 06/27/20   [provider]  vitamin B-12 (CYANOCOBALAMIN ) 1000 MCG tablet 1 tablet 05/20/20   [provider]  benazepril  (LOTENSIN ) 40 MG tablet Take 40 mg by mouth daily.  02/02/19  [provider]  zolpidem  (AMBIEN ) 10 MG tablet Take 10 mg by mouth at bedtime as needed for sleep.   02/02/19  [provider]     Vital Signs: Temp 97.6, BP 165/75, Pulse 71, resp 15, O2 sat 100%.   Code Status: Full code status per patient  Physical Exam Vitals reviewed.  Constitutional:      General: He is not in acute distress.    Appearance: Normal appearance.  HENT:     Mouth/Throat:     Mouth: Mucous membranes are moist.  Cardiovascular:     Rate and Rhythm: Normal rate and regular rhythm.     Pulses: Normal pulses.     Heart sounds: No murmur heard. Pulmonary:     Effort: Pulmonary effort is normal. No respiratory distress.     Breath sounds: Normal breath sounds.  Abdominal:     General: Abdomen is flat.     Tenderness: There is no abdominal tenderness.  Musculoskeletal:        General: Normal range of motion.  Skin:    General: Skin is warm and dry.  Neurological:     Mental Status: He is alert and oriented to person, place, and time.  Psychiatric:        Mood and Affect: Mood normal.        Behavior: Behavior normal.        Thought Content: Thought content normal.        Judgment: Judgment normal.     Imaging: No results found.  Labs:  CBC: Recent Labs    04/01/22 2008 01/04/23 1100  WBC 4.6 4.0  HGB 12.4* 13.1  HCT 36.3* 38.0*  PLT 205 203    COAGS: No results for input(s): INR, APTT in the last 8760  hours.  BMP: Recent Labs    04/01/22 2008 12/31/22 0948 01/04/23 1100  NA 138 140 141  K 3.6 4.3 4.0  CL 103 100 104  CO2 27 25 31   GLUCOSE 183* 122* 93  BUN 28* 26 29*  CALCIUM  9.4 10.3* 9.3  CREATININE 1.52* 1.42* 1.41*  GFRNONAA 44*  --  48*    LIVER FUNCTION TESTS: Recent Labs    11/19/22 1242 01/04/23 1100  BILITOT  --  0.6  AST  --  17  ALT  --  9  ALKPHOS  --  61  PROT 6.9 6.8  ALBUMIN   --  3.8    Assessment and Plan: 89 year old male ex smoker with past medical history significant for anemia, BPH, chronic kidney disease, PAF, diastolic heart failure, diabetes, HTN, obesity, prior PE/DVT, lower GI bleed  with prior visceral arteriogram with coil embolization of the distal right colic artery branch and placement of IVC filter on 05/11/2020. He presents now with monoclonal gammopathy and concern for AL amyloidosis/cardiac amyloidosis with positive PYP scan but negative genetic testing.  He is scheduled today for CT-guided bone marrow biopsy as well as fat pad biopsy for further evaluation. Risks and benefits of procedure was discussed with the patient  including, but not limited to bleeding, infection, damage to adjacent structures or low yield requiring additional tests.  All of the questions were answered and there is agreement to proceed.  Consent signed and in chart.   Electronically Signed: Carlin DELENA Griffon, PA-C 01/20/2023, 7:59 AM   I spent a total of 25 minutes at the the patient's bedside AND on the patient's hospital floor or unit, greater than 50% of which was counseling/coordinating care for CT guided bone marrow biopsy and fat pad biopsy.

## 2023-01-20 ENCOUNTER — Other Ambulatory Visit: Payer: Self-pay

## 2023-01-20 ENCOUNTER — Encounter (HOSPITAL_COMMUNITY): Payer: Self-pay

## 2023-01-20 ENCOUNTER — Ambulatory Visit (HOSPITAL_COMMUNITY)
Admission: RE | Admit: 2023-01-20 | Discharge: 2023-01-20 | Disposition: A | Discharge status: Home / Self Care | Payer: Medicare Other | Source: Ambulatory Visit | Attending: Hematology and Oncology | Admitting: Hematology and Oncology

## 2023-01-20 DIAGNOSIS — D649 Anemia, unspecified: Secondary | ICD-10-CM | POA: Diagnosis not present

## 2023-01-20 DIAGNOSIS — D72819 Decreased white blood cell count, unspecified: Secondary | ICD-10-CM | POA: Insufficient documentation

## 2023-01-20 DIAGNOSIS — E1122 Type 2 diabetes mellitus with diabetic chronic kidney disease: Secondary | ICD-10-CM | POA: Diagnosis not present

## 2023-01-20 DIAGNOSIS — Z86711 Personal history of pulmonary embolism: Secondary | ICD-10-CM | POA: Diagnosis not present

## 2023-01-20 DIAGNOSIS — D631 Anemia in chronic kidney disease: Secondary | ICD-10-CM | POA: Diagnosis not present

## 2023-01-20 DIAGNOSIS — D759 Disease of blood and blood-forming organs, unspecified: Secondary | ICD-10-CM | POA: Diagnosis not present

## 2023-01-20 DIAGNOSIS — N400 Enlarged prostate without lower urinary tract symptoms: Secondary | ICD-10-CM | POA: Diagnosis not present

## 2023-01-20 DIAGNOSIS — I480 Paroxysmal atrial fibrillation: Secondary | ICD-10-CM | POA: Insufficient documentation

## 2023-01-20 DIAGNOSIS — I5032 Chronic diastolic (congestive) heart failure: Secondary | ICD-10-CM | POA: Diagnosis not present

## 2023-01-20 DIAGNOSIS — E65 Localized adiposity: Secondary | ICD-10-CM | POA: Diagnosis not present

## 2023-01-20 DIAGNOSIS — Z86718 Personal history of other venous thrombosis and embolism: Secondary | ICD-10-CM | POA: Insufficient documentation

## 2023-01-20 DIAGNOSIS — E859 Amyloidosis, unspecified: Secondary | ICD-10-CM

## 2023-01-20 DIAGNOSIS — Z87891 Personal history of nicotine dependence: Secondary | ICD-10-CM | POA: Diagnosis not present

## 2023-01-20 DIAGNOSIS — E669 Obesity, unspecified: Secondary | ICD-10-CM | POA: Insufficient documentation

## 2023-01-20 DIAGNOSIS — N1831 Chronic kidney disease, stage 3a: Secondary | ICD-10-CM | POA: Insufficient documentation

## 2023-01-20 DIAGNOSIS — D472 Monoclonal gammopathy: Secondary | ICD-10-CM | POA: Insufficient documentation

## 2023-01-20 DIAGNOSIS — D704 Cyclic neutropenia: Secondary | ICD-10-CM | POA: Diagnosis not present

## 2023-01-20 DIAGNOSIS — I130 Hypertensive heart and chronic kidney disease with heart failure and stage 1 through stage 4 chronic kidney disease, or unspecified chronic kidney disease: Secondary | ICD-10-CM | POA: Diagnosis not present

## 2023-01-20 HISTORY — DX: Dyspnea, unspecified: R06.00

## 2023-01-20 LAB — CBC WITH DIFFERENTIAL/PLATELET
Abs Immature Granulocytes: 0.01 10*3/uL (ref 0.00–0.07)
Basophils Absolute: 0 10*3/uL (ref 0.0–0.1)
Basophils Relative: 1 %
Eosinophils Absolute: 0.2 10*3/uL (ref 0.0–0.5)
Eosinophils Relative: 7 %
HCT: 36.9 % — ABNORMAL LOW (ref 39.0–52.0)
Hemoglobin: 13 g/dL (ref 13.0–17.0)
Immature Granulocytes: 0 %
Lymphocytes Relative: 29 %
Lymphs Abs: 1 10*3/uL (ref 0.7–4.0)
MCH: 32.4 pg (ref 26.0–34.0)
MCHC: 35.2 g/dL (ref 30.0–36.0)
MCV: 92 fL (ref 80.0–100.0)
Monocytes Absolute: 0.5 10*3/uL (ref 0.1–1.0)
Monocytes Relative: 13 %
Neutro Abs: 1.8 10*3/uL (ref 1.7–7.7)
Neutrophils Relative %: 50 %
Platelets: 158 10*3/uL (ref 150–400)
RBC: 4.01 MIL/uL — ABNORMAL LOW (ref 4.22–5.81)
RDW: 13.7 % (ref 11.5–15.5)
WBC: 3.5 10*3/uL — ABNORMAL LOW (ref 4.0–10.5)
nRBC: 0 % (ref 0.0–0.2)

## 2023-01-20 LAB — BASIC METABOLIC PANEL
Anion gap: 10 (ref 5–15)
BUN: 25 mg/dL — ABNORMAL HIGH (ref 8–23)
CO2: 28 mmol/L (ref 22–32)
Calcium: 9.1 mg/dL (ref 8.9–10.3)
Chloride: 103 mmol/L (ref 98–111)
Creatinine, Ser: 1.47 mg/dL — ABNORMAL HIGH (ref 0.61–1.24)
GFR, Estimated: 46 mL/min — ABNORMAL LOW (ref >60)
Glucose, Bld: 104 mg/dL — ABNORMAL HIGH (ref 70–99)
Potassium: 3.3 mmol/L — ABNORMAL LOW (ref 3.5–5.1)
Sodium: 141 mmol/L (ref 135–145)

## 2023-01-20 LAB — GLUCOSE, CAPILLARY: Glucose-Capillary: 104 mg/dL — ABNORMAL HIGH (ref 70–99)

## 2023-01-20 LAB — PROTIME-INR
INR: 1.1 (ref 0.8–1.2)
Prothrombin Time: 14 s (ref 11.4–15.2)

## 2023-01-20 MED ORDER — FENTANYL CITRATE (PF) 100 MCG/2ML IJ SOLN
INTRAMUSCULAR | Status: AC
  Filled 2023-01-20: qty 2

## 2023-01-20 MED ORDER — MIDAZOLAM HCL 2 MG/2ML IJ SOLN
INTRAMUSCULAR | Status: AC
  Filled 2023-01-20: qty 4

## 2023-01-20 MED ORDER — FENTANYL CITRATE (PF) 100 MCG/2ML IJ SOLN
INTRAMUSCULAR | Status: AC | PRN
  Administered 2023-01-20: 25 ug via INTRAVENOUS
  Administered 2023-01-20: 50 ug via INTRAVENOUS
  Administered 2023-01-20: 25 ug via INTRAVENOUS

## 2023-01-20 MED ORDER — SODIUM CHLORIDE 0.9 % IV SOLN
INTRAVENOUS | Status: DC
Start: 2023-01-20 — End: 2023-01-21

## 2023-01-20 MED ORDER — FLUMAZENIL 0.5 MG/5ML IV SOLN
INTRAVENOUS | Status: AC
Start: 2023-01-20 — End: ?
  Filled 2023-01-20: qty 5

## 2023-01-20 MED ORDER — NALOXONE HCL 0.4 MG/ML IJ SOLN
INTRAMUSCULAR | Status: AC
Start: 2023-01-20 — End: ?
  Filled 2023-01-20: qty 1

## 2023-01-20 MED ORDER — MIDAZOLAM HCL 2 MG/2ML IJ SOLN
INTRAMUSCULAR | Status: AC | PRN
  Administered 2023-01-20 (×2): .5 mg via INTRAVENOUS
  Administered 2023-01-20: 1 mg via INTRAVENOUS

## 2023-01-20 NOTE — Sedation Documentation (Signed)
 Bone marrow biopsy completed. 10 min total 75 mcg Fentanyl 1.5 mg versed. Patient to be flipped for fat pad biopsy

## 2023-01-20 NOTE — Discharge Instructions (Signed)
 Please call Interventional Radiology clinic [phone] with any questions or concerns.  You may remove your dressing and shower tomorrow.   Bone Marrow Aspiration and Bone Marrow Biopsy, Adult, Care After This sheet gives you information about how to care for yourself after your procedure. Your health care provider may also give you more specific instructions. If you have problems or questions, contact your health care provider. What can I expect after the procedure? After the procedure, it is common to have: Mild pain and tenderness. Swelling. Bruising. Follow these instructions at home: Puncture site care Follow instructions from your health care provider about how to take care of the puncture site. Make sure you: Wash your hands with soap and water before and after you change your bandage (dressing). If soap and water are not available, use hand sanitizer. Change your dressing as told by your health care provider. Check your puncture site every day for signs of infection. Check for: More redness, swelling, or pain. Fluid or blood. Warmth. Pus or a bad smell.   Activity Return to your normal activities as told by your health care provider. Ask your health care provider what activities are safe for you. Do not lift anything that is heavier than 10 lb (4.5 kg), or the limit that you are told, until your health care provider says that it is safe. Do not drive for 24 hours if you were given a sedative during your procedure. General instructions Take over-the-counter and prescription medicines only as told by your health care provider. Do not take baths, swim, or use a hot tub until your health care provider approves. Ask your health care provider if you may take showers. You may only be allowed to take sponge baths. If directed, put ice on the affected area. To do this: Put ice in a plastic bag. Place a towel between your skin and the bag. Leave the ice on for 20 minutes, 2-3 times a  day. Keep all follow-up visits as told by your health care provider. This is important.   Contact a health care provider if: Your pain is not controlled with medicine. You have a fever. You have more redness, swelling, or pain around the puncture site. You have fluid or blood coming from the puncture site. Your puncture site feels warm to the touch. You have pus or a bad smell coming from the puncture site. Summary After the procedure, it is common to have mild pain, tenderness, swelling, and bruising. Follow instructions from your health care provider about how to take care of the puncture site and what activities are safe for you. Take over-the-counter and prescription medicines only as told by your health care provider. Contact a health care provider if you have any signs of infection, such as fluid or blood coming from the puncture site. This information is not intended to replace advice given to you by your health care provider. Make sure you discuss any questions you have with your health care provider. Document Revised: 05/16/2018 Document Reviewed: 05/16/2018 Elsevier Patient Education  2021 Elsevier Inc.   Moderate Conscious Sedation, Adult, Care After This sheet gives you information about how to care for yourself after your procedure. Your health care provider may also give you more specific instructions. If you have problems or questions, contact your health care provider. What can I expect after the procedure? After the procedure, it is common to have: Sleepiness for several hours. Impaired judgment for several hours. Difficulty with balance. Vomiting if you eat too  soon. Follow these instructions at home: For the time period you were told by your health care provider: Rest. Do not participate in activities where you could fall or become injured. Do not drive or use machinery. Do not drink alcohol. Do not take sleeping pills or medicines that cause drowsiness. Do not  make important decisions or sign legal documents. Do not take care of children on your own.      Eating and drinking Follow the diet recommended by your health care provider. Drink enough fluid to keep your urine pale yellow. If you vomit: Drink water, juice, or soup when you can drink without vomiting. Make sure you have little or no nausea before eating solid foods.   General instructions Take over-the-counter and prescription medicines only as told by your health care provider. Have a responsible adult stay with you for the time you are told. It is important to have someone help care for you until you are awake and alert. Do not smoke. Keep all follow-up visits as told by your health care provider. This is important. Contact a health care provider if: You are still sleepy or having trouble with balance after 24 hours. You feel light-headed. You keep feeling nauseous or you keep vomiting. You develop a rash. You have a fever. You have redness or swelling around the IV site. Get help right away if: You have trouble breathing. You have new-onset confusion at home. Summary After the procedure, it is common to feel sleepy, have impaired judgment, or feel nauseous if you eat too soon. Rest after you get home. Know the things you should not do after the procedure. Follow the diet recommended by your health care provider and drink enough fluid to keep your urine pale yellow. Get help right away if you have trouble breathing or new-onset confusion at home. This information is not intended to replace advice given to you by your health care provider. Make sure you discuss any questions you have with your health care provider. Document Revised: 04/27/2019 Document Reviewed: 11/23/2018 Elsevier Patient Education  2021 [employer].

## 2023-01-20 NOTE — Procedures (Signed)
 Interventional Radiology Procedure Note  Procedure: CT guided aspirate and core biopsy of right iliac bone; abdominal fat pad biopsy  Complications: None  Recommendations: - Bedrest supine x 1 hrs - Hydrocodone PRN  Pain - Follow biopsy results  Signed,  Wilkie LOIS Lent, MD

## 2023-01-20 NOTE — Sedation Documentation (Signed)
 Fat pd biopsy complete. 9 min total time 25 mcg fentanyl 0.5 mg versed

## 2023-01-20 NOTE — Sedation Documentation (Signed)
 Fat pad biopsy beginning at this time.

## 2023-01-21 ENCOUNTER — Other Ambulatory Visit: Payer: Self-pay | Admitting: Hematology and Oncology

## 2023-01-21 DIAGNOSIS — E859 Amyloidosis, unspecified: Secondary | ICD-10-CM

## 2023-01-24 ENCOUNTER — Telehealth (HOSPITAL_COMMUNITY): Payer: Self-pay | Admitting: *Deleted

## 2023-01-24 LAB — SURGICAL PATHOLOGY

## 2023-01-24 NOTE — Telephone Encounter (Signed)
 Called patient and spoke with wife that Dr. Barbaraann sent a one time dose of 0.5mg  ativan for his cardiac MRI. Informed her that the patient is not allowed to drive while on this medications. She verbalized understanding.  Chantal Requena RN Navigator Cardiac Imaging [facility] [phone] Office [phone] Cell

## 2023-01-24 NOTE — Telephone Encounter (Signed)
 Reaching out to patient to offer assistance regarding upcoming cardiac imaging study; pt verbalizes understanding of appt date/time, parking situation and where to check in, and verified current allergies; name and call back number provided for further questions should they arise  Chantal Requena RN Navigator Cardiac Imaging [facility] [phone] office [phone] cell  Patient reports he is claustrophobic and has an IVC filter. I informed him I will reach out to the ordering provider to see if he can get some medications for claustrophobia.

## 2023-01-25 ENCOUNTER — Other Ambulatory Visit: Payer: Self-pay | Admitting: Cardiovascular Disease

## 2023-01-25 ENCOUNTER — Ambulatory Visit (HOSPITAL_COMMUNITY)
Admission: RE | Admit: 2023-01-25 | Discharge: 2023-01-25 | Disposition: A | Discharge status: Home / Self Care | Payer: Medicare Other | Source: Ambulatory Visit | Attending: Cardiovascular Disease | Admitting: Cardiovascular Disease

## 2023-01-25 DIAGNOSIS — E854 Organ-limited amyloidosis: Secondary | ICD-10-CM

## 2023-01-25 DIAGNOSIS — I43 Cardiomyopathy in diseases classified elsewhere: Secondary | ICD-10-CM | POA: Insufficient documentation

## 2023-01-25 MED ORDER — GADOBUTROL 1 MMOL/ML IV SOLN
10.0000 mL | Freq: Once | INTRAVENOUS | Status: AC | PRN
  Administered 2023-01-25: 10 mL via INTRAVENOUS

## 2023-01-27 ENCOUNTER — Telehealth: Payer: Self-pay | Admitting: Cardiovascular Disease

## 2023-01-27 ENCOUNTER — Telehealth: Payer: Self-pay | Admitting: Pharmacist Clinician (PhC)/ Clinical Pharmacy Specialist

## 2023-01-27 ENCOUNTER — Telehealth: Payer: Self-pay | Admitting: *Deleted

## 2023-01-27 NOTE — Telephone Encounter (Signed)
Spoke with wife - she is aware that free 30 day trial will be sent to them in the next few days.  Form faxed to [facility] 01/27/23

## 2023-01-27 NOTE — Telephone Encounter (Signed)
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

## 2023-01-27 NOTE — Telephone Encounter (Signed)
TCT patient regarding recent biopsies-bone marrow and fat pad. Spoke with both pt and his wife.  Advised : 1) his bone marrow biopsy and fat pad biopsy did not show Amyloidosis 2) his findings are most consistent with MGUS, which needs to be monitored but does not require treatment at this time 3) cardiology has been informed of our findings and will be pursuing treatment of his heart disease. Advised that Dr. Leonides Schanz will see him back in 6 months with labs 1 week prior to his clinic visit. Scheduling message sent

## 2023-01-27 NOTE — Telephone Encounter (Signed)
Called Mrs. Doukas.  We discussed that his bone marrow biopsy was inconsistent with AL amyloidosis.  His diagnosis is more consistent with ATTR amyloidosis.  We will go ahead and start him on acoramadis.  His gene testing was negative.  This is senile ATTR amyloidosis.  He will see me in March.  We will have pharmacy assist with prescribing this medication.  Gerri Spore T. Flora Lipps, MD, [facility]  [facility]  [address], [address] [place], [place] [address] [phone]  8:47 AM

## 2023-01-27 NOTE — Telephone Encounter (Signed)
-----   Message from Thomas Joseph sent at 01/27/2023  8:55 AM EST ----- Please let Mr. Lenhoff know:  1) his bone marrow biopsy and fat pad biopsy did not show Amyloidosis 2) his findings are most consistent with MGUS, which needs to be monitored but does not require treatment at this time 3) cardiology has been informed of our findings and will be pursuing treatment of his heart disease. 4) please schedule him a follow up visit in 6 months with labs 1 week before ----- Message ----- From: Leory Plowman, [facility] [place] Sent: 01/20/2023   7:56 AM EST To: Jaci Standard, MD

## 2023-01-28 ENCOUNTER — Other Ambulatory Visit (HOSPITAL_COMMUNITY): Payer: Self-pay

## 2023-01-28 ENCOUNTER — Telehealth: Payer: Self-pay | Admitting: Pharmacy Technician

## 2023-01-28 NOTE — Telephone Encounter (Signed)
Pharmacy Patient Advocate Encounter   Received notification from Pt Calls Messages that prior authorization for attruby is required/requested.   Insurance verification completed.   The patient is insured through [facility] .   Per test claim: PA required; PA submitted to above mentioned insurance via CoverMyMeds Key/confirmation #/EOC [id number] Status is pending

## 2023-01-28 NOTE — Telephone Encounter (Signed)
Pharmacy Patient Advocate Encounter  Received notification from [facility] that Prior Authorization for attruby has been APPROVED from 01/28/23 to 01/11/24   PA #/Case ID/Reference #: [id number]

## 2023-01-28 NOTE — Progress Notes (Signed)
Patient notified by Dr Derek Mound RN Waynetta Sandy that his bone marrow biopsy was negative for Amyloidosis and will be following up with cardiology for treatment for his heart disease.

## 2023-01-31 MED ORDER — ATTRUBY 356 MG PO TBPK
2.0000 | ORAL_TABLET | Freq: Two times a day (BID) | ORAL | 12 refills | Status: DC
  Filled 2023-02-01: qty 112, 28d supply, fill #0
  Filled 2023-02-23: qty 112, 28d supply, fill #1
  Filled 2023-03-28: qty 112, 28d supply, fill #2
  Filled 2023-04-20: qty 112, 28d supply, fill #3
  Filled 2023-05-18: qty 112, 28d supply, fill #4
  Filled 2023-06-13: qty 140, 35d supply, fill #5
  Filled 2023-06-17: qty 112, 28d supply, fill #5
  Filled 2023-07-13: qty 112, 28d supply, fill #6
  Filled 2023-08-16: qty 112, 28d supply, fill #7
  Filled 2023-09-09 – 2023-09-13 (×2): qty 112, 28d supply, fill #8
  Filled 2023-10-13: qty 112, 28d supply, fill #9
  Filled 2023-11-09: qty 112, 28d supply, fill #10
  Filled 2023-12-09 – 2023-12-15 (×2): qty 112, 28d supply, fill #11
  Filled 2024-01-18 – 2024-01-19 (×2): qty 112, 28d supply, fill #12

## 2023-01-31 NOTE — Telephone Encounter (Signed)
Spoke with wife, income is < $102,000, will qualify for [employer].    Card ID    [id number] BIN           [id number] PCN         [id number] GRP         [id number]  Expires 12/31/23

## 2023-01-31 NOTE — Addendum Note (Signed)
Addended by: Rosalee Kaufman on: 01/31/2023 12:10 PM   Modules accepted: Orders

## 2023-02-01 ENCOUNTER — Other Ambulatory Visit: Payer: Self-pay

## 2023-02-01 NOTE — Progress Notes (Signed)
Specialty Pharmacy Initiation Note   Thomas Joseph is a 89 y.o. male who will be followed by the specialty pharmacy service for RxSp Cardiology    Review of administration, indication, effectiveness, safety, potential side effects, storage/disposable, and missed dose instructions occurred today for patient's specialty medication(s) Acoramidis HCl [name])     Patient/Caregiver did not have any additional questions or concerns.   Patient's therapy is appropriate to: Initiate    Goals Addressed             This Visit's Progress    Stabilization of disease       Patient is initiating therapy. Patient will be evaluated at upcoming provider appointment to assess progress         Bobette Mo Specialty Pharmacist

## 2023-02-01 NOTE — Progress Notes (Signed)
Specialty Pharmacy Initial Fill Coordination Note  Thomas Joseph is a 89 y.o. male contacted today regarding initial fill of specialty medication(s) Acoramidis HCl [name])   Patient requested Delivery   Delivery date: 02/04/23   Verified address: [address]   Medication will be filled on 1/23.   Patient is aware of $0 copayment.

## 2023-02-02 ENCOUNTER — Ambulatory Visit: Payer: Medicare Other | Admitting: Podiatry

## 2023-02-02 ENCOUNTER — Telehealth: Payer: Self-pay | Admitting: Hematology and Oncology

## 2023-02-02 NOTE — Telephone Encounter (Signed)
Scheduled appointments per scheduling message. Patient is aware of the made appointments and will be mailed an appointment reminder.

## 2023-02-03 ENCOUNTER — Other Ambulatory Visit (HOSPITAL_COMMUNITY): Payer: Self-pay

## 2023-02-08 ENCOUNTER — Encounter (HOSPITAL_COMMUNITY): Payer: Self-pay | Admitting: Hematology and Oncology

## 2023-02-10 ENCOUNTER — Encounter: Payer: Self-pay | Admitting: Hematology

## 2023-02-18 ENCOUNTER — Other Ambulatory Visit: Payer: Self-pay

## 2023-02-23 ENCOUNTER — Other Ambulatory Visit: Payer: Self-pay

## 2023-02-23 NOTE — Progress Notes (Signed)
Specialty Pharmacy Refill Coordination Note  Thomas Joseph is a 89 y.o. male contacted today regarding refills of specialty medication(s) Acoramidis HCl [name])   Patient requested Delivery   Delivery date: 03/01/23   Verified address: [address]   Medication will be filled on 02.17.25.

## 2023-02-28 ENCOUNTER — Other Ambulatory Visit: Payer: Self-pay

## 2023-03-01 ENCOUNTER — Encounter: Payer: Self-pay | Admitting: Podiatry

## 2023-03-01 ENCOUNTER — Ambulatory Visit (INDEPENDENT_AMBULATORY_CARE_PROVIDER_SITE_OTHER): Payer: Medicare Other | Admitting: Podiatry

## 2023-03-01 VITALS — Ht 72.0 in | Wt 209.0 lb

## 2023-03-01 DIAGNOSIS — M79674 Pain in right toe(s): Secondary | ICD-10-CM

## 2023-03-01 DIAGNOSIS — M79675 Pain in left toe(s): Secondary | ICD-10-CM

## 2023-03-01 DIAGNOSIS — E119 Type 2 diabetes mellitus without complications: Secondary | ICD-10-CM | POA: Diagnosis not present

## 2023-03-01 DIAGNOSIS — L84 Corns and callosities: Secondary | ICD-10-CM | POA: Diagnosis not present

## 2023-03-01 DIAGNOSIS — E1151 Type 2 diabetes mellitus with diabetic peripheral angiopathy without gangrene: Secondary | ICD-10-CM

## 2023-03-01 DIAGNOSIS — M2011 Hallux valgus (acquired), right foot: Secondary | ICD-10-CM

## 2023-03-01 DIAGNOSIS — M2012 Hallux valgus (acquired), left foot: Secondary | ICD-10-CM | POA: Diagnosis not present

## 2023-03-01 DIAGNOSIS — B351 Tinea unguium: Secondary | ICD-10-CM

## 2023-03-01 NOTE — Progress Notes (Signed)
 ANNUAL DIABETIC FOOT EXAM  Subjective: Thomas Joseph presents today for annual diabetic foot exam. Chief Complaint  Patient presents with   [facility]   Patient confirms h/o diabetes.  Patient denies any h/o foot wounds.  Patient has been diagnosed with neuropathy.  Thomas Palmer, MD is patient's PCP.  Past Medical History:  Diagnosis Date   Anemia    Arrhythmia    BPH (benign prostatic hyperplasia)    Chronic kidney disease (CKD) stage G3a/A1, moderately decreased glomerular filtration rate (GFR) between 45-59 mL/min/1.73 square meter and albuminuria creatinine ratio less than 30 mg/g (HCC) 03/07/2020   Diabetes mellitus without complication (HCC)    Dyspnea    ED (erectile dysfunction)    Family history of adverse reaction to anesthesia    son had some problem years ago, pt unsure of what exact problem was   Hypertension    Obesity    Pneumonia    Patient Active Problem List   Diagnosis Date Noted   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   Pancreatic cyst 11/02/2022   Protein calorie malnutrition (HCC) 11/02/2022   Acute embolism and thrombosis of unspecified deep veins of unspecified lower extremity (HCC) 05/31/2021   Shoulder joint pain 05/31/2021   Abnormal gait 02/24/2021   Acute deep vein thrombosis (DVT) of calf muscle vein of left lower extremity (HCC) 02/24/2021   Acute pulmonary embolism (HCC) 02/24/2021   Hemorrhage of colon due to diverticulosis 02/24/2021   History of DVT (deep vein thrombosis) 02/24/2021   Malnutrition of mild degree Lily Kocher: 75% to less than 90% of standard weight) (HCC) 02/24/2021   Personal history of pulmonary embolism 02/24/2021   Polymyalgia rheumatica (HCC) 02/24/2021   Presence of other vascular implants and grafts 02/24/2021   Urinary hesitancy 02/24/2021   Vitamin B deficiency 02/24/2021   Hypomagnesemia 05/11/2020   Atrial fibrillation with RVR (HCC) 05/11/2020   GI bleed 05/05/2020   AKI (acute kidney injury) (HCC)     2019 novel coronavirus disease (COVID-19) 03/07/2020   Anemia 03/07/2020   Arthropathy 03/07/2020   Body mass index (BMI) 32.0-32.9, adult 03/07/2020   Body mass index (BMI) 34.0-34.9, adult 03/07/2020   Chronic kidney disease (CKD) stage G3a/A1, moderately decreased glomerular filtration rate (GFR) between 45-59 mL/min/1.73 square meter and albuminuria creatinine ratio less than 30 mg/g (HCC) 03/07/2020   Chronic sinusitis 03/07/2020   Constipation 03/07/2020   Diabetic renal disease (HCC) 03/07/2020   Hardening of the aorta (main artery of the heart) (HCC) 03/07/2020   Hyperlipidemia 03/07/2020   Microscopic hematuria 03/07/2020   Nausea 03/07/2020   Other abnormalities of gait and mobility 03/07/2020   History of colonic polyps 03/07/2020   Pneumonia due to SARS-associated coronavirus 03/07/2020   Primary open-angle glaucoma, right eye, mild stage 03/07/2020   Degeneration of lumbar intervertebral disc 03/07/2020   Radiculopathy due to lumbar intervertebral disc disorder 03/07/2020   Right lower quadrant pain 03/07/2020   S/P TURP (status post transurethral resection of prostate) 03/07/2020   Secondary hyperparathyroidism of renal origin (HCC) 03/07/2020   Senile purpura (HCC) 03/07/2020   Sequelae of other specified infectious and parasitic diseases 03/07/2020   Stricture of artery (HCC) 03/07/2020   Subclinical hypothyroidism 03/07/2020   History of COVID-19 04/11/2019   Shortness of breath 04/11/2019   Irregular heart rhythm 04/11/2019   Fatigue 04/11/2019   Generalized weakness 04/11/2019   Acute respiratory failure with hypoxia (HCC) 02/02/2019   Essential hypertension 02/02/2019   Type 2 diabetes mellitus with complication, without long-term  current use of insulin (HCC) 02/02/2019   Hypokalemia 02/02/2019   BPH (benign prostatic hyperplasia) 12/26/2014   Past Surgical History:  Procedure Laterality Date    LEFT KNEE ARTHROCOPY     COLONOSCOPY WITH PROPOFOL Left  05/07/2020   Procedure: COLONOSCOPY WITH PROPOFOL;  Surgeon: Kathi Der, MD;  Location: [facility] ENDOSCOPY;  Service: Gastroenterology;  Laterality: Left;   COLONOSCOPY WITH PROPOFOL N/A 05/11/2020   Procedure: COLONOSCOPY WITH PROPOFOL;  Surgeon: Charlott Rakes, MD;  Location: [facility] END[facility]COPY;  Service: Endoscopy;  Laterality: N/A;   ESOPHAGOGASTRODUODENOSCOPY N/A 05/06/2020   Procedure: ESOPHAGOGASTRODUODENOSCOPY (EGD);  Surgeon: Willis Modena, MD;  Location: [facility] ENDOSCOPY;  Service: Endoscopy;  Laterality: N/A;   HERNIA REPAIR     IR ANGIOGRAM SELECTIVE EACH ADDITIONAL VESSEL  05/11/2020   IR ANGIOGRAM VISCERAL SELECTIVE  05/11/2020   IR EMBO ART  VEN HEMORR LYMPH EXTRAV  INC GUIDE ROADMAPPING  05/11/2020   IR IVC FILTER PLMT / S&I /IMG GUID/MOD SED  05/11/2020   IR RADIOLOGIST EVAL & MGMT  08/27/2020   IR RADIOLOGIST EVAL & MGMT  02/26/2021   IR US GUIDE VASC ACCESS RIGHT  05/11/2020   IR VENOGRAM RENAL BI  05/11/2020   TRANSURETHRAL RESECTION OF PROSTATE N/A 12/26/2014   Procedure: TRANSURETHRAL RESECTION OF THE PROSTATE;  Surgeon: Malen Gauze, MD;  Location: [facility] ORS;  Service: Urology;  Laterality: N/A;   Current Outpatient Medications on File Prior to Visit  Medication Sig Dispense Refill   acetaminophen (TYLENOL) 500 MG tablet Take 500 mg by mouth every 6 (six) hours as needed for moderate pain (pain score 4-6).     Acoramidis, 712MG  Twice Daily, (ATTRUBY) 356 MG TBPK Take 2 tablets by mouth in the morning and at bedtime. 120 each 12   amLODipine (NORVASC) 10 MG tablet Take 10 mg by mouth daily.     Blood Pressure Monitor DEVI      cetirizine (ZYRTEC ALLERGY) 10 MG tablet 1 tablet as needed     docusate sodium (COLACE) 100 MG capsule 1 capsule     furosemide (LASIX) 20 MG tablet Take 1 tablet (20 mg total) by mouth every other day. 90 tablet 3   ketorolac (ACULAR) 0.5 % ophthalmic solution Place 1 drop into the right eye 4 (four) times daily.     latanoprost (XALATAN)  0.005 % ophthalmic solution 1 drop into both eyes at bedtime     lisinopril (ZESTRIL) 40 MG tablet Take 1 tablet (40 mg total) by mouth daily. 90 tablet 3   metFORMIN (GLUCOPHAGE-XR) 500 MG 24 hr tablet Take 2 tablets by mouth daily.     Polyethylene Glycol 3350 (MIRALAX PO) Take by mouth as needed.     sulfamethoxazole-trimethoprim (BACTRIM DS) 800-160 MG tablet Take 1 tablet by mouth 2 (two) times daily.     vitamin B-12 (CYANOCOBALAMIN) 1000 MCG tablet 1 tablet     [DISCONTINUED] benazepril (LOTENSIN) 40 MG tablet Take 40 mg by mouth daily.     [DISCONTINUED] zolpidem (AMBIEN) 10 MG tablet Take 10 mg by mouth at bedtime as needed for sleep.      No current facility-administered medications on file prior to visit.    Allergies  Allergen Reactions   Atorvastatin Other (See Comments)    Muscle pain  Other Reaction(s): shoulder/arm pain   Hydrochlorothiazide Other (See Comments)    Reaction not recalled, believes it just made him urinate very frequently  Other Reaction(s): ed   Metoprolol     Other reaction(s): high BP,  urinary symptoms  Other Reaction(s): high BP, urinary symptoms   Tizanidine Hcl Other (See Comments)    Other Reaction(s): groggy, sleepy, hallucinations   Sildenafil Palpitations    Other reaction(s): TACHYCARDIA  Other Reaction(s): TACHYCARDIA   Viagra [Sildenafil Citrate] Palpitations   Social History   Occupational History   Occupation: Production designer, theatre/television/film  Tobacco Use   Smoking status: Former    Current packs/day: 0.00    Types: Cigarettes    Quit date: 11/28/1984    Years since quitting: 38.2   Smokeless tobacco: Never  Vaping Use   Vaping status: Never Used  Substance and Sexual Activity   Alcohol use: Not Currently    Comment: occasional glass of wine   Drug use: No   Sexual activity: Not on file   Family History  Problem Relation Age of Onset   CVA Father    Hypertension Father    Leukemia Sister    Thyroid disease Sister    Cancer  Brother    Immunization History  Administered Date(s) Administered   Influenza Split 10/26/2007, 10/22/2008, 10/17/2009, 10/12/2010, 10/22/2010, 11/09/2011, 10/20/2012   Influenza,inj,Quad PF,6+ Mos 09/23/2015, 10/28/2016, 09/27/2017, 09/28/2018, 10/18/2019   Influenza,inj,quad, With Preservative 10/12/2013, 10/28/2014   PFIZER(Purple Top)SARS-COV-2 Vaccination 01/24/2019, 04/18/2019, 11/10/2019   Pneumococcal Conjugate-13 01/08/2014   Pneumococcal Polysaccharide-23 10/04/2007   Td 08/12/2003   Tdap 07/11/2012   Zoster, Live 11/05/2009     Review of Systems: Negative except as noted in the HPI.   Objective: There were no vitals filed for this visit.  Thomas Joseph is a pleasant 89 y.o. male in NAD. AAO X 3.  Diabetic foot exam was performed with the following findings:   Vascular Examination: CFT <3 seconds b/l. DP pulses faintly palpable b/l. PT pulses nonpalpable b/l. Digital hair absent. Skin temperature gradient warm to warm b/l. No pain with calf compression. No ischemia or gangrene. No cyanosis or clubbing noted b/l.    Neurological Examination: Sensation grossly intact b/l with 10 gram monofilament.Pt has subjective symptoms of neuropathy. Vibratory sensation diminished b/l.  Dermatological Examination:  Pedal skin thin and atrophic b/l LE. No open wounds b/l LE. No interdigital macerations noted b/l LE. Toenails 2-5 bilaterally elongated, discolored, dystrophic, thickened, and crumbly with subungual debris and tenderness to dorsal palpation. Anonychia noted bilateral 2nd toes. Nailbed(s) epithelialized.  Hyperkeratotic lesion(s) submet head 5 b/l.  No erythema, no edema, no drainage, no fluctuance.  Musculoskeletal Examination: Muscle strength 5/5 to all lower extremity muscle groups bilaterally. HAV with bunion deformity noted b/l LE. Utilizes cane for ambulation assistance.  Radiographs: None     Lab Results  Component Value Date   HGBA1C 7.2 (H) 05/05/2020    ADA Risk Categorization: Low Risk :  Patient has all of the following: Intact protective sensation No prior foot ulcer  No severe deformity Pedal pulses present  Assessment: 1. Pain due to onychomycosis of toenails of both feet   2. Callus   3. Hallux valgus, acquired, bilateral   4. Type II diabetes mellitus with peripheral circulatory disorder (HCC)   5. Encounter for diabetic foot exam (HCC)     Plan: Diabetic foot examination performed today. All patient's and/or POA's questions/concerns addressed on today's visit. Toenails 1-5 debrided in length and girth without incident. Callus(es) submet head 5 b/l pared with sharp debridement without incident. Continue daily foot inspections and monitor blood glucose per PCP/Endocrinologist's recommendations.Continue soft, supportive shoe gear daily. Report any pedal injuries to medical professional. Call office if there are  any questions/concerns. -Patient/POA to call should there be question/concern in the interim. Return in about 3 months (around 05/29/2023).  [name], DPM      [place] LOCATION: [address] [place] [address]                   Office [phone]   [facility] LOCATION: [address] [place], [place] [address] Office [phone]

## 2023-03-07 ENCOUNTER — Encounter: Payer: Self-pay | Admitting: Podiatry

## 2023-03-09 DIAGNOSIS — H02831 Dermatochalasis of right upper eyelid: Secondary | ICD-10-CM | POA: Diagnosis not present

## 2023-03-09 DIAGNOSIS — H11133 Conjunctival pigmentations, bilateral: Secondary | ICD-10-CM | POA: Diagnosis not present

## 2023-03-09 DIAGNOSIS — H401131 Primary open-angle glaucoma, bilateral, mild stage: Secondary | ICD-10-CM | POA: Diagnosis not present

## 2023-03-09 DIAGNOSIS — E119 Type 2 diabetes mellitus without complications: Secondary | ICD-10-CM | POA: Diagnosis not present

## 2023-03-09 DIAGNOSIS — H11823 Conjunctivochalasis, bilateral: Secondary | ICD-10-CM | POA: Diagnosis not present

## 2023-03-09 DIAGNOSIS — H02834 Dermatochalasis of left upper eyelid: Secondary | ICD-10-CM | POA: Diagnosis not present

## 2023-03-09 DIAGNOSIS — H31092 Other chorioretinal scars, left eye: Secondary | ICD-10-CM | POA: Diagnosis not present

## 2023-03-09 DIAGNOSIS — Z961 Presence of intraocular lens: Secondary | ICD-10-CM | POA: Diagnosis not present

## 2023-03-24 ENCOUNTER — Other Ambulatory Visit (HOSPITAL_COMMUNITY): Payer: Self-pay

## 2023-03-28 ENCOUNTER — Other Ambulatory Visit: Payer: Self-pay

## 2023-03-28 NOTE — Progress Notes (Unsigned)
 Cardiology Office Note:  .   Date:  03/31/2023  ID:  [name], DOB Feb 01, 1933, MRN [id number] PCP: Mila Palmer, MD  [facility] Providers Cardiologist:  None { History of Present Illness: .    Chief Complaint  Patient presents with   Shortness of Breath    Pt says at times   Dizziness    Pt says feels dizzy at times, feels like he's going to fall while walking     Thomas Joseph is a 89 y.o. male with history of cardiac amyloidosis who presents for follow-up.    History of Present Illness   Thomas Joseph is an 89 year old male with TTR cardiac amyloidosis who presents for follow-up. He is accompanied by Mrs. Malburg.  He has a history of cardiac amyloidosis, specifically transthyretin (TTR) amyloidosis, confirmed by a positive PYP scan and a bone marrow biopsy that showed no AL amyloid. He is currently on Attruby, taken twice daily, which he tolerates well. Despite treatment, he continues to experience shortness of breath during physical activity.  He experiences persistent shortness of breath during physical activity, which he manages by staying active, such as walking in his yard during warmer days. No chest pain, but he does experience shortness of breath and dizziness with activity.  Dizziness and balance issues are significant problems for him. He underwent physical therapy about a year ago and is open to resuming it to help with his balance.  His medication regimen includes Lasix 20 mg every other day to manage fluid levels, amlodipine 10 mg daily, and lisinopril 40 mg daily for hypertension. He is not on anticoagulation therapy due to a history of gastrointestinal bleeding. He notes some swelling in his feet but finds the fluid management acceptable.  He has gained a few pounds recently, attributed to an increased appetite.          Problem List Cardiac amyloidosis, senile -genetic testing negative -PYP +  -M Spike present: negative BMBx   Paroxysmal Afib  -no AC due to GI bleed (12 unit transfusions in 2022) 3. DVT/PE 4. DM -A1c 6.5 5. HTN 6. HLD -T chol 180, HDL 61, LDL 97, TG 123 7. GI Bleed  8. CKD 3a    ROS: All other ROS reviewed and negative. Pertinent positives noted in the HPI.     Studies Reviewed: Marland Kitchen       CMR 01/25/2023 IMPRESSION: 1. Findings overall suggests cardiac amyloidosis, possibly early involvement. Data is somewhat incongruent, with normal T1 myocardial nulling kinetics and low burden of delayed myocardial enhancement. However, ECV is elevated at 41% and there is diffuse myocardial edema. There is evidence of subendocardial delayed enhancement in the basal to mid LV lateral wall and basal anterior wall. There is subendocardial LGE in the right ventricular basal anterior and inferior walls. There is atrial dilation and small pericardial effusion. Consider tissue diagnosis for amyloidosis if clinically relevant.   2. Normal LV chamber size and systolic function. LVEF 59%. Mild-moderately increased LV wall thickness, 13 mm.   3.  Normal RV chamber size and systolic function.  RVEF 62%.   4.  Mild mitral and tricuspid valve regurgitation by quantitation.   Physical Exam:   VS:  BP 128/64 (BP Location: Left Arm, Patient Position: Sitting, Cuff Size: Normal)   Pulse 64   Ht 6' (1.829 m)   Wt 213 lb 12.8 oz (97 kg)   SpO2 98%   BMI 29.00 kg/m    Wt Readings  from Last 3 Encounters:  03/31/23 213 lb 12.8 oz (97 kg)  03/01/23 209 lb (94.8 kg)  01/04/23 209 lb 14.4 oz (95.2 kg)    GEN: Well nourished, well developed in no acute distress NECK: No JVD; No carotid bruits CARDIAC: RRR, no murmurs, rubs, gallops RESPIRATORY:  Clear to auscultation without rales, wheezing or rhonchi  ABDOMEN: Soft, non-tender, non-distended EXTREMITIES:  No edema; No deformity  ASSESSMENT AND PLAN: .   Assessment and Plan    Transthyretin (TTR) amyloidosis Senile TTR amyloidosis confirmed by positive PYP  scan and absence of AL amyloid. CMR+  - Volume status acceptable; lasix 20 mg every other day  - Continue Attruby indefinitely. - Monitor for fluid overload symptoms. - Refer to physical therapy for balance and conditioning.  Balance issues and dizziness Balance issues and dizziness likely related to TTR amyloidosis and deconditioning. - Refer to physical therapy for balance and conditioning. - Schedule physical therapy at [facility].  Paroxysmal atrial fibrillation Paroxysmal atrial fibrillation with no current signs. Anticoagulation avoided due to GI bleed history. - Avoid anticoagulation.  Hypertension Blood pressure well-controlled. Kidney function stable. - Continue amlodipine 10 mg daily. - Continue lisinopril 40 mg daily.              Follow-up: Return in about 6 months (around 10/01/2023).  Signed, Lenna Gilford. Flora Lipps, MD, [facility]  [facility]  [address] [place], [place] [address] [phone]  10:52 AM

## 2023-03-28 NOTE — Progress Notes (Signed)
 Specialty Pharmacy Refill Coordination Note  Thomas Joseph is a 89 y.o. male contacted today regarding refills of specialty medication(s) Acoramidis HCl [name])   Patient requested Delivery   Delivery date: 03/30/23   Verified address: [address]   Medication will be filled on 03.18.25.

## 2023-03-29 ENCOUNTER — Other Ambulatory Visit: Payer: Self-pay

## 2023-03-29 NOTE — Progress Notes (Signed)
 Will sign off on patient, MD will be following in 6 months. Per note 01/24/23 Beth T. RN. ) his bone marrow biopsy and fat pad biopsy did not show Amyloidosis 2) his findings are most consistent with MGUS, which needs to be monitored but does not require treatment at this time 3) cardiology has been informed of our findings and will be pursuing treatment of his heart disease. 4) please schedule him a follow up visit in 6 months with labs 1 week before

## 2023-03-31 ENCOUNTER — Encounter: Payer: Self-pay | Admitting: Cardiovascular Disease

## 2023-03-31 ENCOUNTER — Ambulatory Visit: Payer: Medicare Other | Attending: Cardiovascular Disease | Admitting: Cardiovascular Disease

## 2023-03-31 VITALS — BP 128/64 | HR 64 | Ht 72.0 in | Wt 213.8 lb

## 2023-03-31 DIAGNOSIS — I5032 Chronic diastolic (congestive) heart failure: Secondary | ICD-10-CM | POA: Diagnosis not present

## 2023-03-31 DIAGNOSIS — R0602 Shortness of breath: Secondary | ICD-10-CM | POA: Diagnosis not present

## 2023-03-31 DIAGNOSIS — R2689 Other abnormalities of gait and mobility: Secondary | ICD-10-CM

## 2023-03-31 DIAGNOSIS — I43 Cardiomyopathy in diseases classified elsewhere: Secondary | ICD-10-CM | POA: Diagnosis not present

## 2023-03-31 DIAGNOSIS — I517 Cardiomegaly: Secondary | ICD-10-CM | POA: Diagnosis not present

## 2023-03-31 DIAGNOSIS — I480 Paroxysmal atrial fibrillation: Secondary | ICD-10-CM | POA: Diagnosis not present

## 2023-03-31 DIAGNOSIS — E854 Organ-limited amyloidosis: Secondary | ICD-10-CM | POA: Diagnosis not present

## 2023-03-31 NOTE — Patient Instructions (Signed)
 Medication Instructions:  Your physician recommends that you continue on your current medications as directed. Please refer to the Current Medication list given to you today.    *If you need a refill on your cardiac medications before your next appointment, please call your pharmacy*   Lab Work: None    If you have labs (blood work) drawn today and your tests are completely normal, you will receive your results only by: MyChart Message (if you have MyChart) OR A paper copy in the mail If you have any lab test that is abnormal or we need to change your treatment, we will call you to review the results.   Testing/Procedures: None    Follow-Up: At [facility], you and your health needs are our priority.  As part of our continuing mission to provide you with exceptional heart care, we have created designated Provider Care Teams.  These Care Teams include your primary Cardiologist (physician) and Advanced Practice Providers (APPs -  Physician Assistants and Nurse Practitioners) who all work together to provide you with the care you need, when you need it.  We recommend signing up for the patient portal called "MyChart".  Sign up information is provided on this After Visit Summary.  MyChart is used to connect with patients for Virtual Visits (Telemedicine).  Patients are able to view lab/test results, encounter notes, upcoming appointments, etc.  Non-urgent messages can be sent to your provider as well.   To learn more about what you can do with MyChart, go to [email or web address].    Your next appointment:   6 month(s)  The format for your next appointment:   In Person  Provider:   Lennie Odor, MD     Other Instructions   Dr. Scharlene Gloss HAS REFERRED YOUR TO PHYSICAL THERAPY AT [facility]

## 2023-04-13 ENCOUNTER — Ambulatory Visit: Attending: Cardiovascular Disease | Admitting: Physical Therapy

## 2023-04-13 ENCOUNTER — Other Ambulatory Visit: Payer: Self-pay

## 2023-04-13 ENCOUNTER — Encounter: Payer: Self-pay | Admitting: Physical Therapy

## 2023-04-13 DIAGNOSIS — R262 Difficulty in walking, not elsewhere classified: Secondary | ICD-10-CM | POA: Insufficient documentation

## 2023-04-13 DIAGNOSIS — M5459 Other low back pain: Secondary | ICD-10-CM | POA: Insufficient documentation

## 2023-04-13 DIAGNOSIS — E854 Organ-limited amyloidosis: Secondary | ICD-10-CM | POA: Diagnosis not present

## 2023-04-13 DIAGNOSIS — R2689 Other abnormalities of gait and mobility: Secondary | ICD-10-CM | POA: Insufficient documentation

## 2023-04-13 DIAGNOSIS — I43 Cardiomyopathy in diseases classified elsewhere: Secondary | ICD-10-CM | POA: Diagnosis not present

## 2023-04-13 DIAGNOSIS — M6281 Muscle weakness (generalized): Secondary | ICD-10-CM | POA: Insufficient documentation

## 2023-04-13 DIAGNOSIS — R2681 Unsteadiness on feet: Secondary | ICD-10-CM | POA: Diagnosis not present

## 2023-04-13 NOTE — Therapy (Signed)
 OUTPATIENT PHYSICAL THERAPY THORACOLUMBAR EVALUATION  Patient Name: Thomas Joseph MRN: [id number] DOB:22-May-1933, 89 y.o., male Today's Date: 04/13/2023   PT End of Session - 04/13/23 1450     Visit Number 1    Number of Visits --   1-2x/week   Date for PT Re-Evaluation 06/08/23    Authorization Type [employer] MCR - PSFS    Progress Note Due on Visit 10    PT Start Time 1400    PT Stop Time 1445    PT Time Calculation (min) 45 min             Past Medical History:  Diagnosis Date   Anemia    Arrhythmia    BPH (benign prostatic hyperplasia)    Chronic kidney disease (CKD) stage G3a/A1, moderately decreased glomerular filtration rate (GFR) between 45-59 mL/min/1.73 square meter and albuminuria creatinine ratio less than 30 mg/g (HCC) 03/07/2020   Diabetes mellitus without complication (HCC)    Dyspnea    ED (erectile dysfunction)    Family history of adverse reaction to anesthesia    son had some problem years ago, pt unsure of what exact problem was   Hypertension    Obesity    Pneumonia    Past Surgical History:  Procedure Laterality Date    LEFT KNEE ARTHROCOPY     COLONOSCOPY WITH PROPOFOL Left 05/07/2020   Procedure: COLONOSCOPY WITH PROPOFOL;  Surgeon: Kathi Der, MD;  Location: [facility] ENDOSCOPY;  Service: Gastroenterology;  Laterality: Left;   COLONOSCOPY WITH PROPOFOL N/A 05/11/2020   Procedure: COLONOSCOPY WITH PROPOFOL;  Surgeon: Charlott Rakes, MD;  Location[facility]COPY;  Service: Endoscopy;  Laterality: N/A;   ESOPHAGOGASTRODUODENOSCOPY N/A 05/06/2020   Procedure: ESOPHAGOGASTRODUODENOSCOPY (EGD);  Surgeon: Willis Modena, MD;  Location: [facility] ENDOSCOPY;  Service: Endoscopy;  Laterality: N/A;   HERNIA REPAIR     IR ANGIOGRAM SELECTIVE EACH ADDITIONAL VESSEL  05/11/2020   IR ANGIOGRAM VISCERAL SELECTIVE  05/11/2020   IR EMBO ART  VEN HEMORR LYMPH EXTRAV  INC GUIDE ROADMAPPING  05/11/2020   IR IVC FILTER PLMT / S&I /IMG GUID/MOD SED  05/11/2020   IR  RADIOLOGIST EVAL & MGMT  08/27/2020   IR RADIOLOGIST EVAL & MGMT  02/26/2021   IR US GUIDE VASC ACCESS RIGHT  05/11/2020   IR VENOGRAM RENAL BI  05/11/2020   TRANSURETHRAL RESECTION OF PROSTATE N/A 12/26/2014   Procedure: TRANSURETHRAL RESECTION OF THE PROSTATE;  Surgeon: Malen Gauze, MD;  Location: [facility] ORS;  Service: Urology;  Laterality: N/A;   Patient Active Problem List   Diagnosis Date Noted   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   Pancreatic cyst 11/02/2022   Protein calorie malnutrition (HCC) 11/02/2022   Acute embolism and thrombosis of unspecified deep veins of unspecified lower extremity (HCC) 05/31/2021   Shoulder joint pain 05/31/2021   Abnormal gait 02/24/2021   Acute deep vein thrombosis (DVT) of calf muscle vein of left lower extremity (HCC) 02/24/2021   Acute pulmonary embolism (HCC) 02/24/2021   Hemorrhage of colon due to diverticulosis 02/24/2021   History of DVT (deep vein thrombosis) 02/24/2021   Malnutrition of mild degree Lily Kocher: 75% to less than 90% of standard weight) (HCC) 02/24/2021   Personal history of pulmonary embolism 02/24/2021   Polymyalgia rheumatica (HCC) 02/24/2021   Presence of other vascular implants and grafts 02/24/2021   Urinary hesitancy 02/24/2021   Vitamin B deficiency 02/24/2021   Hypomagnesemia 05/11/2020   Atrial fibrillation with RVR (HCC) 05/11/2020   GI bleed 05/05/2020  AKI (acute kidney injury) (HCC)    2019 novel coronavirus disease (COVID-19) 03/07/2020   Anemia 03/07/2020   Arthropathy 03/07/2020   Body mass index (BMI) 32.0-32.9, adult 03/07/2020   Body mass index (BMI) 34.0-34.9, adult 03/07/2020   Chronic kidney disease (CKD) stage G3a/A1, moderately decreased glomerular filtration rate (GFR) between 45-59 mL/min/1.73 square meter and albuminuria creatinine ratio less than 30 mg/g (HCC) 03/07/2020   Chronic sinusitis 03/07/2020   Constipation 03/07/2020   Diabetic renal disease (HCC) 03/07/2020   Hardening  of the aorta (main artery of the heart) (HCC) 03/07/2020   Hyperlipidemia 03/07/2020   Microscopic hematuria 03/07/2020   Nausea 03/07/2020   Other abnormalities of gait and mobility 03/07/2020   History of colonic polyps 03/07/2020   Pneumonia due to SARS-associated coronavirus 03/07/2020   Primary open-angle glaucoma, right eye, mild stage 03/07/2020   Degeneration of lumbar intervertebral disc 03/07/2020   Radiculopathy due to lumbar intervertebral disc disorder 03/07/2020   Right lower quadrant pain 03/07/2020   S/P TURP (status post transurethral resection of prostate) 03/07/2020   Secondary hyperparathyroidism of renal origin (HCC) 03/07/2020   Senile purpura (HCC) 03/07/2020   Sequelae of other specified infectious and parasitic diseases 03/07/2020   Stricture of artery (HCC) 03/07/2020   Subclinical hypothyroidism 03/07/2020   History of COVID-19 04/11/2019   Shortness of breath 04/11/2019   Irregular heart rhythm 04/11/2019   Fatigue 04/11/2019   Generalized weakness 04/11/2019   Acute respiratory failure with hypoxia (HCC) 02/02/2019   Essential hypertension 02/02/2019   Type 2 diabetes mellitus with complication, without long-term current use of insulin (HCC) 02/02/2019   Hypokalemia 02/02/2019   BPH (benign prostatic hyperplasia) 12/26/2014    PCP: Mila Palmer, MD  REFERRING PROVIDER: Sande Rives, *  THERAPY DIAG:  Unsteadiness on feet - Plan: PT plan of care cert/re-cert  Muscle weakness (generalized) - Plan: PT plan of care cert/re-cert  REFERRING DIAG: Balance  Rationale for Evaluation and Treatment:  Rehabilitation  SUBJECTIVE:  PERTINENT PAST HISTORY:  DM, Afib, SOB, renal disease, lumbar radiculopathy, Hx of DVT, cardiac amyloidosis       PRECAUTIONS: None  WEIGHT BEARING RESTRICTIONS No  FALLS:  Has patient fallen in last 6 months? No, Number of falls: 0  MOI/History of condition:  Onset date: chronic, worsening over 2-3  months  SUBJECTIVE STATEMENT  Thomas Joseph is a 89 y.o. male who presents to clinic with chief complaint of general deconditioning, loss of balance, and light headedness.  He denies any LOC, visual changes, or sensation of spinning.  Pt states that his BP was normal when he was checked for orthostatic hypotension.  He does feel like if he sits for a long period the dizziness is worse (2 hrs is much worse than 30 min)  He states that the light headedness is mostly when he stands up after sitting for a long period and then abates within a few minutes.  He feels generally weak.  He states that he walks regularly when he goes to they [facility]      Pain:  Are you having pain? No  Occupation: retired  Education administrator: [employer]  Hand Dominance: na  Patient Goals/Specific Activities: be able to work around [employer].   OBJECTIVE:   GENERAL OBSERVATION/GAIT: Slow shuffling gait  VITALS   BP: 165/65 sitting, 167/80 standing   HR: 55 sitting, 58 standing  LE MMT:  MMT Right (Eval) Left (Eval)  Hip flexion (L2, L3) 4 4  Knee extension (  L3) 4 4+  Knee flexion 4+ 4+  Hip abduction    Hip extension    Hip external rotation    Hip internal rotation    Hip adduction    Ankle dorsiflexion (L4) 3+ 5  Ankle plantarflexion (S1)    Ankle inversion    Ankle eversion    Great Toe ext (L5)    Grossly     (Blank rows = not tested, score listed is out of 5 possible points.  N = WNL, D = diminished, C = clear for gross weakness with myotome testing, * = concordant pain with testing)  LE ROM:  ROM Right (Eval) Left (Eval)  Hip flexion    Hip extension    Hip abduction    Hip adduction    Hip internal rotation    Hip external rotation    Knee extension    Knee flexion    Ankle dorsiflexion    Ankle plantarflexion    Ankle inversion    Ankle eversion     (Blank rows = not tested, N = WNL, * = concordant pain with testing)  Functional Tests  Eval    30'' STS: 6x  UE used? Y     10 m max gait speed: 21'', .48 m/s, AD: SPC    Progressive balance screen (highest level completed for >/= 10''):  Feet together: 10'' Semi Tandem: R in rear 10'', L in rear 10'' unstable Tandem: R in rear unable, L in rear unable    2 MWT: 177'                                               PATIENT SURVEYS:  Patient Specific Functional Scale:  Activity Eval         Getting out of a chair and feeling stable 3         Walking without feeling like he may fall 4                             Average 3.5          (Activities rated 0-10/10.  "10" represents "able to perform at prior level" while "0" represents "unable to perform." )   TODAY'S TREATMENT   Therapeutic Exercise: Creating, reviewing, and completing below HEP  PATIENT EDUCATION:  POC, diagnosis, prognosis, HEP, and outcome measures.  Pt educated via explanation, demonstration, and handout (HEP).  Pt confirms understanding verbally.   HOME EXERCISE PROGRAM: Access Code: [email or web address] URL: [email or web address] Date: 04/13/2023 Prepared by: Alphonzo Severance  Exercises - Standing Hip Abduction with Counter Support  - 1 x daily - 7 x weekly - 3 sets - 10 reps - Heel Raises with Counter Support  - 1 x daily - 7 x weekly - 3 sets - 10 reps - Standing March with Counter Support  - 1 x daily - 7 x weekly - 3 sets - 10 reps - Heel Toe Raises with Counter Support  - 1 x daily - 7 x weekly - 3 sets - 10 reps  Treatment priorities   Eval        Check DF ROM        General functional strengthening        balance  ASSESSMENT:  CLINICAL IMPRESSION: Thomas Joseph is a 89 y.o. male who presents to clinic with signs and sxs consistent with instability in gait and weakness.  Pt presents with shuffling gait which worsens with fatigue.  He feels his legs are heavy after walking <1 min.  His dizziness sound most consistent with orthostatic hypotension although he had no significant drop when  moving from sitting to standing in clinic today.  He states that it is worse after sitting for >1 hour.  We talked about the need to stay aware of this and avoid walking until the feeling as abated.  Thomas Joseph will benefit from skilled PT to address relevant deficits and improve safety and independence in ambulation  OBJECTIVE IMPAIRMENTS: LE and hip strength, gait, balance  ACTIVITY LIMITATIONS: standing, walking, transfers, housework, yardwork  PERSONAL FACTORS: See medical history and pertinent history   REHAB POTENTIAL: Good  CLINICAL DECISION MAKING: Evolving/moderate complexity  EVALUATION COMPLEXITY: Moderate   GOALS:   SHORT TERM GOALS: Target date: 05/11/2023   Thomas Joseph will be >75% HEP compliant to improve carryover between sessions and facilitate independent management of condition  Evaluation: ongoing Goal status: INITIAL   LONG TERM GOALS: Target date: 06/08/2023    Goebel will report confidence in maintenance of balance at time of discharge with advanced HEP  Evaluation/Baseline: unable to self manage Goal status: INITIAL   2.  Thomas Joseph will be able to stand for >15'' in tandem stance, to show a significant improvement in balance in order to reduce fall risk   Evaluation/Baseline: unable Goal status: INITIAL   3.  Thomas Joseph will improve 30'' STS (MCID 2) to >/= 8x (w/ UE?: Y) to show improved LE strength and improved transfers   Evaluation/Baseline: 6x  w/ UE? Y Goal status: INITIAL   4.  Thomas Joseph will improve 10 meter max gait speed to .6 m/s (.1 m/s MCID) to show functional improvement in ambulation   Evaluation/Baseline: .48 m/s Goal status: INITIAL   Norms:     5.  Thomas Joseph will improve two minute walk test to 240 feet (MCID 40 ft).  Evaluation/Baseline: 177 ft Goal status: INITIAL    PLAN: PT FREQUENCY: 1-2x/week  PT DURATION: 8 weeks  PLANNED INTERVENTIONS: Therapeutic exercises, Aquatic therapy, Therapeutic activity, Neuro Muscular  re-education, Gait training, Patient/Family education, Joint mobilization, Dry Needling, Electrical stimulation, Spinal mobilization and/or manipulation, Moist heat, Taping, Vasopneumatic device, Ionotophoresis 4mg /ml Dexamethasone, and Manual therapy   Alphonzo Severance PT, DPT 04/13/2023, 3:04 PM  Date of referral: 3/20 Referring provider: [place] Referring diagnosis? imbalance Treatment diagnosis? (if different than referring diagnosis) unsteadiness on feet R26.81  What was this (referring dx) caused by? Ongoing Issue  Ashby Dawes of Condition: Chronic (continuous duration > 3 months)   Laterality: Both  Current Functional Measure Score: Other PSFS: 3.5  Objective measurements identify impairments when they are compared to normal values, the uninvolved extremity, and prior level of function.  [x]  Yes  []  No  Objective assessment of functional ability: Severe functional limitations   Briefly describe symptoms: unsteadiness on feet, difficulty with transfers, poor strength and endurance  How did symptoms start: slow onset  Average pain intensity:  Last 24 hours: 0  Past week: 0  How often does the pt experience symptoms? Constantly  How much have the symptoms interfered with usual daily activities? Quite a bit  How has condition changed since care began at this facility? NA - initial visit  In general, how is the patients overall health? Fair   BACK PAIN (STarT  Back Screening Tool) No

## 2023-04-15 NOTE — Addendum Note (Signed)
 Addended by: Jonah Blue on: 04/15/2023 03:45 PM   Modules accepted: Orders

## 2023-04-19 ENCOUNTER — Encounter: Payer: Self-pay | Admitting: Physical Therapy

## 2023-04-19 ENCOUNTER — Ambulatory Visit: Admitting: Physical Therapy

## 2023-04-19 ENCOUNTER — Other Ambulatory Visit: Payer: Self-pay

## 2023-04-19 DIAGNOSIS — R262 Difficulty in walking, not elsewhere classified: Secondary | ICD-10-CM | POA: Diagnosis not present

## 2023-04-19 DIAGNOSIS — M5459 Other low back pain: Secondary | ICD-10-CM

## 2023-04-19 DIAGNOSIS — M6281 Muscle weakness (generalized): Secondary | ICD-10-CM

## 2023-04-19 DIAGNOSIS — R2681 Unsteadiness on feet: Secondary | ICD-10-CM | POA: Diagnosis not present

## 2023-04-19 DIAGNOSIS — E854 Organ-limited amyloidosis: Secondary | ICD-10-CM | POA: Diagnosis not present

## 2023-04-19 DIAGNOSIS — R2689 Other abnormalities of gait and mobility: Secondary | ICD-10-CM | POA: Diagnosis not present

## 2023-04-19 DIAGNOSIS — I43 Cardiomyopathy in diseases classified elsewhere: Secondary | ICD-10-CM | POA: Diagnosis not present

## 2023-04-19 NOTE — Therapy (Signed)
 OUTPATIENT PHYSICAL THERAPY DAILY NOTE  Patient Name: Thomas Joseph MRN: [id number] DOB:[birth year], 89 y.o., male Today's Date: 04/19/2023   PT End of Session - 04/19/23 1017     Visit Number 2    Number of Visits --   1-2x/week   Date for PT Re-Evaluation 06/08/23    Authorization Type [employer] MCR - PSFS    Progress Note Due on Visit 10    PT Start Time 1015    PT Stop Time 1056    PT Time Calculation (min) 41 min             Past Medical History:  Diagnosis Date   Anemia    Arrhythmia    BPH (benign prostatic hyperplasia)    Chronic kidney disease (CKD) stage G3a/A1, moderately decreased glomerular filtration rate (GFR) between 45-59 mL/min/1.73 square meter and albuminuria creatinine ratio less than 30 mg/g (HCC) 03/07/2020   Diabetes mellitus without complication (HCC)    Dyspnea    ED (erectile dysfunction)    Family history of adverse reaction to anesthesia    son had some problem years ago, pt unsure of what exact problem was   Hypertension    Obesity    Pneumonia    Past Surgical History:  Procedure Laterality Date    LEFT KNEE ARTHROCOPY     COLONOSCOPY WITH PROPOFOL Left 05/07/2020   Procedure: COLONOSCOPY WITH PROPOFOL;  Surgeon: Kathi Der, MD;  Location: [facility] ENDOSCOPY;  Service: Gastroenterology;  Laterality: Left;   COLONOSCOPY WITH PROPOFOL N/A 05/11/2020   Procedure: COLONOSCOPY WITH PROPOFOL;  Surgeon: Charlott Rakes, MD;  Location: [facility] ENDOSCOPY;  Service: Endoscopy;  Laterality: N/A;   ESOPHAGOGASTRODUODENOSCOPY N/A 05/06/2020   Procedure: ESOPHAGOGASTRODUODENOSCOPY (EGD);  Surgeon: Willis Modena, MD;  Location: [facility] ENDOSCOPY;  Service: Endoscopy;  Laterality: N/A;   HERNIA REPAIR     IR ANGIOGRAM SELECTIVE EACH ADDITIONAL VESSEL  05/11/2020   IR ANGIOGRAM VISCERAL SELECTIVE  05/11/2020   IR EMBO ART  VEN HEMORR LYMPH EXTRAV  INC GUIDE ROADMAPPING  05/11/2020   IR IVC FILTER PLMT / S&I /IMG GUID/MOD SED  05/11/2020   IR RADIOLOGIST EVAL &  MGMT  08/27/2020   IR RADIOLOGIST EVAL & MGMT  02/26/2021   IR US GUIDE VASC ACCESS RIGHT  05/11/2020   IR VENOGRAM RENAL BI  05/11/2020   TRANSURETHRAL RESECTION OF PROSTATE N/A 12/26/2014   Procedure: TRANSURETHRAL RESECTION OF THE PROSTATE;  Surgeon: Malen Gauze, MD;  Location: [facility] ORS;  Service: Urology;  Laterality: N/A;   Patient Active Problem List   Diagnosis Date Noted   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   Pancreatic cyst 11/02/2022   Protein calorie malnutrition (HCC) 11/02/2022   Acute embolism and thrombosis of unspecified deep veins of unspecified lower extremity (HCC) 05/31/2021   Shoulder joint pain 05/31/2021   Abnormal gait 02/24/2021   Acute deep vein thrombosis (DVT) of calf muscle vein of left lower extremity (HCC) 02/24/2021   Acute pulmonary embolism (HCC) 02/24/2021   Hemorrhage of colon due to diverticulosis 02/24/2021   History of DVT (deep vein thrombosis) 02/24/2021   Malnutrition of mild degree Lily Kocher: 75% to less than 90% of standard weight) (HCC) 02/24/2021   Personal history of pulmonary embolism 02/24/2021   Polymyalgia rheumatica (HCC) 02/24/2021   Presence of other vascular implants and grafts 02/24/2021   Urinary hesitancy 02/24/2021   Vitamin B deficiency 02/24/2021   Hypomagnesemia 05/11/2020   Atrial fibrillation with RVR (HCC) 05/11/2020   GI bleed 05/05/2020  AKI (acute kidney injury) (HCC)    2019 novel coronavirus disease (COVID-19) 03/07/2020   Anemia 03/07/2020   Arthropathy 03/07/2020   Body mass index (BMI) 32.0-32.9, adult 03/07/2020   Body mass index (BMI) 34.0-34.9, adult 03/07/2020   Chronic kidney disease (CKD) stage G3a/A1, moderately decreased glomerular filtration rate (GFR) between 45-59 mL/min/1.73 square meter and albuminuria creatinine ratio less than 30 mg/g (HCC) 03/07/2020   Chronic sinusitis 03/07/2020   Constipation 03/07/2020   Diabetic renal disease (HCC) 03/07/2020   Hardening of the aorta (main  artery of the heart) (HCC) 03/07/2020   Hyperlipidemia 03/07/2020   Microscopic hematuria 03/07/2020   Nausea 03/07/2020   Other abnormalities of gait and mobility 03/07/2020   History of colonic polyps 03/07/2020   Pneumonia due to SARS-associated coronavirus 03/07/2020   Primary open-angle glaucoma, right eye, mild stage 03/07/2020   Degeneration of lumbar intervertebral disc 03/07/2020   Radiculopathy due to lumbar intervertebral disc disorder 03/07/2020   Right lower quadrant pain 03/07/2020   S/P TURP (status post transurethral resection of prostate) 03/07/2020   Secondary hyperparathyroidism of renal origin (HCC) 03/07/2020   Senile purpura (HCC) 03/07/2020   Sequelae of other specified infectious and parasitic diseases 03/07/2020   Stricture of artery (HCC) 03/07/2020   Subclinical hypothyroidism 03/07/2020   History of COVID-19 04/11/2019   Shortness of breath 04/11/2019   Irregular heart rhythm 04/11/2019   Fatigue 04/11/2019   Generalized weakness 04/11/2019   Acute respiratory failure with hypoxia (HCC) 02/02/2019   Essential hypertension 02/02/2019   Type 2 diabetes mellitus with complication, without long-term current use of insulin (HCC) 02/02/2019   Hypokalemia 02/02/2019   BPH (benign prostatic hyperplasia) 12/26/2014    PCP: Mila Palmer, MD  REFERRING PROVIDER: Mila Palmer, MD  THERAPY DIAG:  Unsteadiness on feet  Muscle weakness (generalized)  Other low back pain  Other abnormalities of gait and mobility  REFERRING DIAG: Balance  Rationale for Evaluation and Treatment:  Rehabilitation  SUBJECTIVE:  PERTINENT PAST HISTORY:  DM, Afib, SOB, renal disease, lumbar radiculopathy, Hx of DVT, cardiac amyloidosis       PRECAUTIONS: None  WEIGHT BEARING RESTRICTIONS No  FALLS:  Has patient fallen in last 6 months? No, Number of falls: 0  MOI/History of condition:  Onset date: chronic, worsening over 2-3 months  SUBJECTIVE  STATEMENT  04/19/2023: Pt reports he is feeling ok today and he feels like his lightheadedness is somewhat improved.  EVAL:  Thomas Joseph is a 89 y.o. male who presents to clinic with chief complaint of general deconditioning, loss of balance, and light headedness.  He denies any LOC, visual changes, or sensation of spinning.  Pt states that his BP was normal when he was checked for orthostatic hypotension.  He does feel like if he sits for a long period the dizziness is worse (2 hrs is much worse than 30 min)  He states that the light headedness is mostly when he stands up after sitting for a long period and then abates within a few minutes.  He feels generally weak.  He states that he walks regularly when he goes to they [facility].      Pain:  Are you having pain? No  Occupation: retired  Education administrator: [employer]  Hand Dominance: na  Patient Goals/Specific Activities: be able to work around [employer].   OBJECTIVE:   GENERAL OBSERVATION/GAIT: Slow shuffling gait  VITALS   BP: 165/65 sitting, 167/80 standing   HR: 55 sitting, 58 standing  LE  MMT:  MMT Right (Eval) Left (Eval)  Hip flexion (L2, L3) 4 4  Knee extension (L3) 4 4+  Knee flexion 4+ 4+  Hip abduction    Hip extension    Hip external rotation    Hip internal rotation    Hip adduction    Ankle dorsiflexion (L4) 3+ 5  Ankle plantarflexion (S1)    Ankle inversion    Ankle eversion    Great Toe ext (L5)    Grossly     (Blank rows = not tested, score listed is out of 5 possible points.  N = WNL, D = diminished, C = clear for gross weakness with myotome testing, * = concordant pain with testing)  LE ROM:  ROM Right (Eval) Left (Eval)  Hip flexion    Hip extension    Hip abduction    Hip adduction    Hip internal rotation    Hip external rotation    Knee extension    Knee flexion    Ankle dorsiflexion    Ankle plantarflexion    Ankle inversion    Ankle eversion     (Blank rows = not tested, N = WNL,  * = concordant pain with testing)  Functional Tests  Eval    30'' STS: 6x  UE used? Y    10 m max gait speed: 21'', .48 m/s, AD: SPC    Progressive balance screen (highest level completed for >/= 10''):  Feet together: 10'' Semi Tandem: R in rear 10'', L in rear 10'' unstable Tandem: R in rear unable, L in rear unable    2 MWT: 177'                                               PATIENT SURVEYS:  Patient Specific Functional Scale:  Activity Eval         Getting out of a chair and feeling stable 3         Walking without feeling like he may fall 4                             Average 3.5          (Activities rated 0-10/10.  "10" represents "able to perform at prior level" while "0" represents "unable to perform." )   TODAY'S TREATMENT   Therapeutic Exercise: Creating, reviewing, and completing below HEP  [facility] Adult PT Treatment  04/19/2023:  Therapeutic Exercise:  nu-step L5 41m while taking subjective and planning session with patient Slant board stretch - 45'' x2  Therapeutic Activity  STS from raised table - 2x10 Hurdle step over fwd and lat in // Heel raises - 2x15 Tandem stance (50%)    HOME EXERCISE PROGRAM: Access Code: [email or web address] URL: [email or web address] Date: 04/19/2023 Prepared by: Alphonzo Severance  Exercises - Standing Hip Abduction with Counter Support  - 1 x daily - 7 x weekly - 3 sets - 10 reps - Heel Raises with Counter Support  - 1 x daily - 7 x weekly - 3 sets - 10 reps - Standing March with Counter Support  - 1 x daily - 7 x weekly - 3 sets - 10 reps - Heel Toe Raises with Counter Support  - 1 x daily - 7 x weekly - 3 sets - 10 reps -  Gastroc Stretch on Wall  - 1 x daily - 7 x weekly - 1 sets - 3 reps - 45 seconds hold - Sit to Stand with Counter Support  - 1 x daily - 7 x weekly - 2-3 sets - 5-10 reps  Treatment priorities   Eval 4/8       Check DF ROM DF ROM       General functional strengthening balance        balance Step height                        ASSESSMENT:  CLINICAL IMPRESSION:  04/19/2023: Thomas Maduro tolerated session well with no adverse reaction.  We concentrated on general endurance and safety in gait.  He has poor DF ROM bil and reduced step height and length leading to shuffling gait and likely increased fall risk.  Pt reports fatigue but no other negative sxs and is able to complete session with no rest breaks.  HEP updated.  EVAL:  Thomas Joseph is a 89 y.o. male who presents to clinic with signs and sxs consistent with instability in gait and weakness.  Pt presents with shuffling gait which worsens with fatigue.  He feels his legs are heavy after walking <1 min.  His dizziness sound most consistent with orthostatic hypotension although he had no significant drop when moving from sitting to standing in clinic today.  He states that it is worse after sitting for >1 hour.  We talked about the need to stay aware of this and avoid walking until the feeling as abated.  Thomas Joseph will benefit from skilled PT to address relevant deficits and improve safety and independence in ambulation  OBJECTIVE IMPAIRMENTS: LE and hip strength, gait, balance  ACTIVITY LIMITATIONS: standing, walking, transfers, housework, yardwork  PERSONAL FACTORS: See medical history and pertinent history   REHAB POTENTIAL: Good  CLINICAL DECISION MAKING: Evolving/moderate complexity  EVALUATION COMPLEXITY: Moderate   GOALS:   SHORT TERM GOALS: Target date: 05/11/2023   Julien will be >75% HEP compliant to improve carryover between sessions and facilitate independent management of condition  Evaluation: ongoing Goal status: INITIAL   LONG TERM GOALS: Target date: 06/08/2023    Thomas Joseph will report confidence in maintenance of balance at time of discharge with advanced HEP  Evaluation/Baseline: unable to self manage Goal status: INITIAL   2.  Thomas Joseph will be able to stand for >15'' in tandem stance, to show a  significant improvement in balance in order to reduce fall risk   Evaluation/Baseline: unable Goal status: INITIAL   3.  Thomas Joseph will improve 30'' STS (MCID 2) to >/= 8x (w/ UE?: Y) to show improved LE strength and improved transfers   Evaluation/Baseline: 6x  w/ UE? Y Goal status: INITIAL   4.  Thomas Joseph will improve 10 meter max gait speed to .6 m/s (.1 m/s MCID) to show functional improvement in ambulation   Evaluation/Baseline: .48 m/s Goal status: INITIAL   Norms:     5.  Thomas Joseph will improve two minute walk test to 240 feet (MCID 40 ft).  Evaluation/Baseline: 177 ft Goal status: INITIAL    PLAN: PT FREQUENCY: 1-2x/week  PT DURATION: 8 weeks  PLANNED INTERVENTIONS: Therapeutic exercises, Aquatic therapy, Therapeutic activity, Neuro Muscular re-education, Gait training, Patient/Family education, Joint mobilization, Dry Needling, Electrical stimulation, Spinal mobilization and/or manipulation, Moist heat, Taping, Vasopneumatic device, Ionotophoresis 4mg /ml Dexamethasone, and Manual therapy   Alphonzo Severance PT, DPT 04/19/2023, 12:38 PM  Date of referral: 3/20  Referring provider: O'[place] Referring diagnosis? imbalance Treatment diagnosis? (if different than referring diagnosis) unsteadiness on feet R26.81  What was this (referring dx) caused by? Ongoing Issue  Ashby Dawes of Condition: Chronic (continuous duration > 3 months)   Laterality: Both  Current Functional Measure Score: Other PSFS: 3.5  Objective measurements identify impairments when they are compared to normal values, the uninvolved extremity, and prior level of function.  [x]  Yes  []  No  Objective assessment of functional ability: Severe functional limitations   Briefly describe symptoms: unsteadiness on feet, difficulty with transfers, poor strength and endurance  How did symptoms start: slow onset  Average pain intensity:  Last 24 hours: 0  Past week: 0  How often does the pt experience  symptoms? Constantly  How much have the symptoms interfered with usual daily activities? Quite a bit  How has condition changed since care began at this facility? NA - initial visit  In general, how is the patients overall health? Fair   BACK PAIN (STarT Back Screening Tool) No

## 2023-04-20 ENCOUNTER — Other Ambulatory Visit: Payer: Self-pay

## 2023-04-20 NOTE — Progress Notes (Signed)
 Specialty Pharmacy Refill Coordination Note  Thomas Joseph is a 89 y.o. male contacted today regarding refills of specialty medication(s) Acoramidis HCl [name])   Patient requested Delivery   Delivery date: 04/25/23   Verified address: [address]   Medication will be filled on 04/22/23.

## 2023-04-22 ENCOUNTER — Other Ambulatory Visit: Payer: Self-pay

## 2023-04-23 ENCOUNTER — Ambulatory Visit

## 2023-04-27 ENCOUNTER — Encounter: Payer: Self-pay | Admitting: Physical Therapy

## 2023-04-27 ENCOUNTER — Ambulatory Visit: Admitting: Physical Therapy

## 2023-04-27 DIAGNOSIS — M6281 Muscle weakness (generalized): Secondary | ICD-10-CM

## 2023-04-27 DIAGNOSIS — R2681 Unsteadiness on feet: Secondary | ICD-10-CM

## 2023-04-27 DIAGNOSIS — M5459 Other low back pain: Secondary | ICD-10-CM | POA: Diagnosis not present

## 2023-04-27 DIAGNOSIS — I43 Cardiomyopathy in diseases classified elsewhere: Secondary | ICD-10-CM | POA: Diagnosis not present

## 2023-04-27 DIAGNOSIS — R262 Difficulty in walking, not elsewhere classified: Secondary | ICD-10-CM | POA: Diagnosis not present

## 2023-04-27 DIAGNOSIS — E854 Organ-limited amyloidosis: Secondary | ICD-10-CM | POA: Diagnosis not present

## 2023-04-27 DIAGNOSIS — R2689 Other abnormalities of gait and mobility: Secondary | ICD-10-CM | POA: Diagnosis not present

## 2023-04-27 NOTE — Therapy (Signed)
 OUTPATIENT PHYSICAL THERAPY DAILY NOTE  Patient Name: Thomas Joseph MRN: [id number] DOB:[birth year], 89 y.o., male Today's Date: 04/27/2023   PT End of Session - 04/27/23 1015     Visit Number 3    Number of Visits --   1-2x/week   Date for PT Re-Evaluation 06/08/23    Authorization Type [employer] MCR - PSFS    Progress Note Due on Visit 10    PT Start Time 1015    PT Stop Time 1056    PT Time Calculation (min) 41 min             Past Medical History:  Diagnosis Date   Anemia    Arrhythmia    BPH (benign prostatic hyperplasia)    Chronic kidney disease (CKD) stage G3a/A1, moderately decreased glomerular filtration rate (GFR) between 45-59 mL/min/1.73 square meter and albuminuria creatinine ratio less than 30 mg/g (HCC) 03/07/2020   Diabetes mellitus without complication (HCC)    Dyspnea    ED (erectile dysfunction)    Family history of adverse reaction to anesthesia    son had some problem years ago, pt unsure of what exact problem was   Hypertension    Obesity    Pneumonia    Past Surgical History:  Procedure Laterality Date    LEFT KNEE ARTHROCOPY     COLONOSCOPY WITH PROPOFOL Left 05/07/2020   Procedure: COLONOSCOPY WITH PROPOFOL;  Surgeon: Felecia Hopper, MD;  Location: [facility] ENDOSCOPY;  Service: Gastroenterology;  Laterality: Left;   COLONOSCOPY WITH PROPOFOL N/A 05/11/2020   Procedure: COLONOSCOPY WITH PROPOFOL;  Surgeon: Baldo Bonds, MD;  Location[facility] ENDOSCOPY;  Service: Endoscopy;  Laterality: N/A;   ESOPHAGOGASTRODUODENOSCOPY N/A 05/06/2020   Procedure: ESOPHAGOGASTRODUODENOSCOPY (EGD);  Surgeon: Evangeline Hilts, MD;  Location: [facility] ENDOSCOPY;  Service: Endoscopy;  Laterality: N/A;   HERNIA REPAIR     IR ANGIOGRAM SELECTIVE EACH ADDITIONAL VESSEL  05/11/2020   IR ANGIOGRAM VISCERAL SELECTIVE  05/11/2020   IR EMBO ART  VEN HEMORR LYMPH EXTRAV  INC GUIDE ROADMAPPING  05/11/2020   IR IVC FILTER PLMT / S&I /IMG GUID/MOD SED  05/11/2020   IR RADIOLOGIST EVAL  & MGMT  08/27/2020   IR RADIOLOGIST EVAL & MGMT  02/26/2021   IR US  GUIDE VASC ACCESS RIGHT  05/11/2020   IR VENOGRAM RENAL BI  05/11/2020   TRANSURETHRAL RESECTION OF PROSTATE N/A 12/26/2014   Procedure: TRANSURETHRAL RESECTION OF THE PROSTATE;  Surgeon: Marco Severs, MD;  Location: [facility] ORS;  Service: Urology;  Laterality: N/A;   Patient Active Problem List   Diagnosis Date Noted   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   Pancreatic cyst 11/02/2022   Protein calorie malnutrition (HCC) 11/02/2022   Acute embolism and thrombosis of unspecified deep veins of unspecified lower extremity (HCC) 05/31/2021   Shoulder joint pain 05/31/2021   Abnormal gait 02/24/2021   Acute deep vein thrombosis (DVT) of calf muscle vein of left lower extremity (HCC) 02/24/2021   Acute pulmonary embolism (HCC) 02/24/2021   Hemorrhage of colon due to diverticulosis 02/24/2021   History of DVT (deep vein thrombosis) 02/24/2021   Malnutrition of mild degree Thomas Joseph: 75% to less than 90% of standard weight) (HCC) 02/24/2021   Personal history of pulmonary embolism 02/24/2021   Polymyalgia rheumatica (HCC) 02/24/2021   Presence of other vascular implants and grafts 02/24/2021   Urinary hesitancy 02/24/2021   Vitamin B deficiency 02/24/2021   Hypomagnesemia 05/11/2020   Atrial fibrillation with RVR (HCC) 05/11/2020   GI bleed 05/05/2020  AKI (acute kidney injury) (HCC)    2019 novel coronavirus disease (COVID-19) 03/07/2020   Anemia 03/07/2020   Arthropathy 03/07/2020   Body mass index (BMI) 32.0-32.9, adult 03/07/2020   Body mass index (BMI) 34.0-34.9, adult 03/07/2020   Chronic kidney disease (CKD) stage G3a/A1, moderately decreased glomerular filtration rate (GFR) between 45-59 mL/min/1.73 square meter and albuminuria creatinine ratio less than 30 mg/g (HCC) 03/07/2020   Chronic sinusitis 03/07/2020   Constipation 03/07/2020   Diabetic renal disease (HCC) 03/07/2020   Hardening of the aorta (main  artery of the heart) (HCC) 03/07/2020   Hyperlipidemia 03/07/2020   Microscopic hematuria 03/07/2020   Nausea 03/07/2020   Other abnormalities of gait and mobility 03/07/2020   History of colonic polyps 03/07/2020   Pneumonia due to SARS-associated coronavirus 03/07/2020   Primary open-angle glaucoma, right eye, mild stage 03/07/2020   Degeneration of lumbar intervertebral disc 03/07/2020   Radiculopathy due to lumbar intervertebral disc disorder 03/07/2020   Right lower quadrant pain 03/07/2020   S/P TURP (status post transurethral resection of prostate) 03/07/2020   Secondary hyperparathyroidism of renal origin (HCC) 03/07/2020   Senile purpura (HCC) 03/07/2020   Sequelae of other specified infectious and parasitic diseases 03/07/2020   Stricture of artery (HCC) 03/07/2020   Subclinical hypothyroidism 03/07/2020   History of COVID-19 04/11/2019   Shortness of breath 04/11/2019   Irregular heart rhythm 04/11/2019   Fatigue 04/11/2019   Generalized weakness 04/11/2019   Acute respiratory failure with hypoxia (HCC) 02/02/2019   Essential hypertension 02/02/2019   Type 2 diabetes mellitus with complication, without long-term current use of insulin (HCC) 02/02/2019   Hypokalemia 02/02/2019   BPH (benign prostatic hyperplasia) 12/26/2014    PCP: Mila Palmer, MD  REFERRING PROVIDER: Mila Palmer, MD  THERAPY DIAG:  Unsteadiness on feet  Muscle weakness (generalized)  Other low back pain  REFERRING DIAG: Balance  Rationale for Evaluation and Treatment:  Rehabilitation  SUBJECTIVE:  PERTINENT PAST HISTORY:  DM, Afib, SOB, renal disease, lumbar radiculopathy, Hx of DVT, cardiac amyloidosis       PRECAUTIONS: None  WEIGHT BEARING RESTRICTIONS No  FALLS:  Has patient fallen in last 6 months? No, Number of falls: 0  MOI/History of condition:  Onset date: chronic, worsening over 2-3 months  SUBJECTIVE STATEMENT  04/27/2023: Pt reports that his lightheadedness  continues to improve.  He has been HEP compliant.  EVAL:  ESAUL Joseph is a 89 y.o. male who presents to clinic with chief complaint of general deconditioning, loss of balance, and light headedness.  He denies any LOC, visual changes, or sensation of spinning.  Pt states that his BP was normal when he was checked for orthostatic hypotension.  He does feel like if he sits for a long period the dizziness is worse (2 hrs is much worse than 30 min)  He states that the light headedness is mostly when he stands up after sitting for a long period and then abates within a few minutes.  He feels generally weak.  He states that he walks regularly when he goes to they [facility].      Pain:  Are you having pain? No  Occupation: retired  Education administrator: [employer]  Hand Dominance: na  Patient Goals/Specific Activities: be able to work around [employer].   OBJECTIVE:   GENERAL OBSERVATION/GAIT: Slow shuffling gait  VITALS   BP: 165/65 sitting, 167/80 standing   HR: 55 sitting, 58 standing  LE MMT:  MMT Right (Eval) Left (Eval)  Hip  flexion (L2, L3) 4 4  Knee extension (L3) 4 4+  Knee flexion 4+ 4+  Hip abduction    Hip extension    Hip external rotation    Hip internal rotation    Hip adduction    Ankle dorsiflexion (L4) 3+ 5  Ankle plantarflexion (S1)    Ankle inversion    Ankle eversion    Great Toe ext (L5)    Grossly     (Blank rows = not tested, score listed is out of 5 possible points.  N = WNL, D = diminished, C = clear for gross weakness with myotome testing, * = concordant pain with testing)  LE ROM:  ROM Right (Eval) Left (Eval)  Hip flexion    Hip extension    Hip abduction    Hip adduction    Hip internal rotation    Hip external rotation    Knee extension    Knee flexion    Ankle dorsiflexion    Ankle plantarflexion    Ankle inversion    Ankle eversion     (Blank rows = not tested, N = WNL, * = concordant pain with testing)  Functional Tests  Eval     30'' STS: 6x  UE used? Y    10 m max gait speed: 21'', .48 m/s, AD: SPC    Progressive balance screen (highest level completed for >/= 10''):  Feet together: 10'' Semi Tandem: R in rear 10'', L in rear 10'' unstable Tandem: R in rear unable, L in rear unable    2 MWT: 177'                                               PATIENT SURVEYS:  Patient Specific Functional Scale:  Activity Eval         Getting out of a chair and feeling stable 3         Walking without feeling like he may fall 4                             Average 3.5          (Activities rated 0-10/10.  "10" represents "able to perform at prior level" while "0" represents "unable to perform." )   TODAY'S TREATMENT   Therapeutic Exercise: Creating, reviewing, and completing below HEP  [facility] Adult PT Treatment  04/19/2023:  Therapeutic Exercise:  nu-step L6 41m while taking subjective and planning session with patient Slant board stretch - 45'' x2  Therapeutic Activity  STS from raised table - 2x10 (not today) Hurdle step over fwd and lat in // - bil UE support - then R UE only (fwd) Step up fwd 4'' (ipsilateral UE) and lat Heel raises - 3x15 - light UE  Neuromuscular re-ed: Blue rocker board DF/PF Lateral walking on airex beam  Consider retro walking for LOB while moving backward    HOME EXERCISE PROGRAM: Access Code: [email or web address] URL: [email or web address] Date: 04/19/2023 Prepared by: Lesleigh Rash  Exercises - Standing Hip Abduction with Counter Support  - 1 x daily - 7 x weekly - 3 sets - 10 reps - Heel Raises with Counter Support  - 1 x daily - 7 x weekly - 3 sets - 10 reps - Standing March with Counter Support  - 1 x daily -  7 x weekly - 3 sets - 10 reps - Heel Toe Raises with Counter Support  - 1 x daily - 7 x weekly - 3 sets - 10 reps - Gastroc Stretch on Wall  - 1 x daily - 7 x weekly - 1 sets - 3 reps - 45 seconds hold - Sit to Stand with Counter Support  - 1 x daily  - 7 x weekly - 2-3 sets - 5-10 reps  Treatment priorities   Eval 4/8 4/16      Check DF ROM DF ROM ''      General functional strengthening balance '' Specifically retro      balance Step height ''                       ASSESSMENT:  CLINICAL IMPRESSION:  04/27/2023: Porfirio Bristol tolerated session well with no adverse reaction.  We continue to work on improving DF ROM to improve clearance in gait and improving step height.  Bearl is capable of non-shuffling gait pattern with cuing.  Discussed dangers of shuffling gait including increased fall risk.  Progressed balance with work on unstable surfaces and rocker board for ankle strategy.  EVAL:  Jaycion is a 89 y.o. male who presents to clinic with signs and sxs consistent with instability in gait and weakness.  Pt presents with shuffling gait which worsens with fatigue.  He feels his legs are heavy after walking <1 min.  His dizziness sound most consistent with orthostatic hypotension although he had no significant drop when moving from sitting to standing in clinic today.  He states that it is worse after sitting for >1 hour.  We talked about the need to stay aware of this and avoid walking until the feeling as abated.  Ahmad will benefit from skilled PT to address relevant deficits and improve safety and independence in ambulation  OBJECTIVE IMPAIRMENTS: LE and hip strength, gait, balance  ACTIVITY LIMITATIONS: standing, walking, transfers, housework, yardwork  PERSONAL FACTORS: See medical history and pertinent history   REHAB POTENTIAL: Good  CLINICAL DECISION MAKING: Evolving/moderate complexity  EVALUATION COMPLEXITY: Moderate   GOALS:   SHORT TERM GOALS: Target date: 05/11/2023   Shaunak will be >75% HEP compliant to improve carryover between sessions and facilitate independent management of condition  Evaluation: ongoing Goal status: INITIAL   LONG TERM GOALS: Target date: 06/08/2023    Marland will report confidence in  maintenance of balance at time of discharge with advanced HEP  Evaluation/Baseline: unable to self manage Goal status: INITIAL   2.  Jeanluc will be able to stand for >15'' in tandem stance, to show a significant improvement in balance in order to reduce fall risk   Evaluation/Baseline: unable Goal status: INITIAL   3.  Wylee will improve 30'' STS (MCID 2) to >/= 8x (w/ UE?: Y) to show improved LE strength and improved transfers   Evaluation/Baseline: 6x  w/ UE? Y Goal status: INITIAL   4.  Tahjae will improve 10 meter max gait speed to .6 m/s (.1 m/s MCID) to show functional improvement in ambulation   Evaluation/Baseline: .48 m/s Goal status: INITIAL   Norms:     5.  Kiyan will improve two minute walk test to 240 feet (MCID 40 ft).  Evaluation/Baseline: 177 ft Goal status: INITIAL    PLAN: PT FREQUENCY: 1-2x/week  PT DURATION: 8 weeks  PLANNED INTERVENTIONS: Therapeutic exercises, Aquatic therapy, Therapeutic activity, Neuro Muscular re-education, Gait training, Patient/Family education, Joint mobilization, Dry  Needling, Electrical stimulation, Spinal mobilization and/or manipulation, Moist heat, Taping, Vasopneumatic device, Ionotophoresis 4mg /ml Dexamethasone, and Manual therapy   Yitzchok Carriger PT, DPT 04/27/2023, 11:03 AM  Date of referral: 3/20 Referring provider: O'Neil Referring diagnosis? imbalance Treatment diagnosis? (if different than referring diagnosis) unsteadiness on feet R26.81  What was this (referring dx) caused by? Ongoing Issue  Lonne Roan of Condition: Chronic (continuous duration > 3 months)   Laterality: Both  Current Functional Measure Score: Other PSFS: 3.5  Objective measurements identify impairments when they are compared to normal values, the uninvolved extremity, and prior level of function.  [x]  Yes  []  No  Objective assessment of functional ability: Severe functional limitations   Briefly describe symptoms: unsteadiness  on feet, difficulty with transfers, poor strength and endurance  How did symptoms start: slow onset  Average pain intensity:  Last 24 hours: 0  Past week: 0  How often does the pt experience symptoms? Constantly  How much have the symptoms interfered with usual daily activities? Quite a bit  How has condition changed since care began at this facility? NA - initial visit  In general, how is the patients overall health? Fair   BACK PAIN (STarT Back Screening Tool) No

## 2023-05-04 ENCOUNTER — Ambulatory Visit: Admitting: Physical Therapy

## 2023-05-04 ENCOUNTER — Encounter: Payer: Self-pay | Admitting: Physical Therapy

## 2023-05-04 DIAGNOSIS — R2689 Other abnormalities of gait and mobility: Secondary | ICD-10-CM

## 2023-05-04 DIAGNOSIS — R2681 Unsteadiness on feet: Secondary | ICD-10-CM | POA: Diagnosis not present

## 2023-05-04 DIAGNOSIS — R262 Difficulty in walking, not elsewhere classified: Secondary | ICD-10-CM | POA: Diagnosis not present

## 2023-05-04 DIAGNOSIS — E854 Organ-limited amyloidosis: Secondary | ICD-10-CM | POA: Diagnosis not present

## 2023-05-04 DIAGNOSIS — I43 Cardiomyopathy in diseases classified elsewhere: Secondary | ICD-10-CM | POA: Diagnosis not present

## 2023-05-04 DIAGNOSIS — M6281 Muscle weakness (generalized): Secondary | ICD-10-CM | POA: Diagnosis not present

## 2023-05-04 DIAGNOSIS — M5459 Other low back pain: Secondary | ICD-10-CM | POA: Diagnosis not present

## 2023-05-04 NOTE — Therapy (Signed)
 OUTPATIENT PHYSICAL THERAPY DAILY NOTE  Patient Name: Thomas Joseph MRN: [id number] DOB:1933/05/14, 89 y.o., male Today's Date: 05/04/2023   PT End of Session - 05/04/23 1017     Visit Number 4    Number of Visits --   1-2x/week   Date for PT Re-Evaluation 06/08/23    Authorization Type [employer] MCR - PSFS    Progress Note Due on Visit 10    PT Start Time 1016    PT Stop Time 1057    PT Time Calculation (min) 41 min             Past Medical History:  Diagnosis Date   Anemia    Arrhythmia    BPH (benign prostatic hyperplasia)    Chronic kidney disease (CKD) stage G3a/A1, moderately decreased glomerular filtration rate (GFR) between 45-59 mL/min/1.73 square meter and albuminuria creatinine ratio less than 30 mg/g (HCC) 03/07/2020   Diabetes mellitus without complication (HCC)    Dyspnea    ED (erectile dysfunction)    Family history of adverse reaction to anesthesia    son had some problem years ago, pt unsure of what exact problem was   Hypertension    Obesity    Pneumonia    Past Surgical History:  Procedure Laterality Date    LEFT KNEE ARTHROCOPY     COLONOSCOPY WITH PROPOFOL  Left 05/07/2020   Procedure: COLONOSCOPY WITH PROPOFOL ;  Surgeon: Felecia Hopper, MD;  Location: [facility] ENDOSCOPY;  Service: Gastroenterology;  Laterality: Left;   COLONOSCOPY WITH PROPOFOL  N/A 05/11/2020   Procedure: COLONOSCOPY WITH PROPOFOL ;  Surgeon: Baldo Bonds, MD;  Location: [facility] END[facility]COPY;  Service: Endoscopy;  Laterality: N/A;   ESOPHAGOGASTRODUODENOSCOPY N/A 05/06/2020   Procedure: ESOPHAGOGASTRODUODENOSCOPY (EGD);  Surgeon: Evangeline Hilts, MD;  Location: [facility] ENDOSCOPY;  Service: Endoscopy;  Laterality: N/A;   HERNIA REPAIR     IR ANGIOGRAM SELECTIVE EACH ADDITIONAL VESSEL  05/11/2020   IR ANGIOGRAM VISCERAL SELECTIVE  05/11/2020   IR EMBO ART  VEN HEMORR LYMPH EXTRAV  INC GUIDE ROADMAPPING  05/11/2020   IR IVC FILTER PLMT / S&I /IMG GUID/MOD SED  05/11/2020   IR RADIOLOGIST EVAL  & MGMT  08/27/2020   IR RADIOLOGIST EVAL & MGMT  02/26/2021   IR US  GUIDE VASC ACCESS RIGHT  05/11/2020   IR VENOGRAM RENAL BI  05/11/2020   TRANSURETHRAL RESECTION OF PROSTATE N/A 12/26/2014   Procedure: TRANSURETHRAL RESECTION OF THE PROSTATE;  Surgeon: Marco Severs, MD;  Location: [facility] ORS;  Service: Urology;  Laterality: N/A;   Patient Active Problem List   Diagnosis Date Noted   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   Pancreatic cyst 11/02/2022   Protein calorie malnutrition (HCC) 11/02/2022   Acute embolism and thrombosis of unspecified deep veins of unspecified lower extremity (HCC) 05/31/2021   Shoulder joint pain 05/31/2021   Abnormal gait 02/24/2021   Acute deep vein thrombosis (DVT) of calf muscle vein of left lower extremity (HCC) 02/24/2021   Acute pulmonary embolism (HCC) 02/24/2021   Hemorrhage of colon due to diverticulosis 02/24/2021   History of DVT (deep vein thrombosis) 02/24/2021   Malnutrition of mild degree [name]: 75% to less than 90% of standard weight) (HCC) 02/24/2021   Personal history of pulmonary embolism 02/24/2021   Polymyalgia rheumatica (HCC) 02/24/2021   Presence of other vascular implants and grafts 02/24/2021   Urinary hesitancy 02/24/2021   Vitamin B deficiency 02/24/2021   Hypomagnesemia 05/11/2020   Atrial fibrillation with RVR (HCC) 05/11/2020   GI bleed 05/05/2020  AKI (acute kidney injury) (HCC)    2019 novel coronavirus disease (COVID-19) 03/07/2020   Anemia 03/07/2020   Arthropathy 03/07/2020   Body mass index (BMI) 32.0-32.9, adult 03/07/2020   Body mass index (BMI) 34.0-34.9, adult 03/07/2020   Chronic kidney disease (CKD) stage G3a/A1, moderately decreased glomerular filtration rate (GFR) between 45-59 mL/min/1.73 square meter and albuminuria creatinine ratio less than 30 mg/g (HCC) 03/07/2020   Chronic sinusitis 03/07/2020   Constipation 03/07/2020   Diabetic renal disease (HCC) 03/07/2020   Hardening of the aorta (main  artery of the heart) (HCC) 03/07/2020   Hyperlipidemia 03/07/2020   Microscopic hematuria 03/07/2020   Nausea 03/07/2020   Other abnormalities of gait and mobility 03/07/2020   History of colonic polyps 03/07/2020   Pneumonia due to SARS-associated coronavirus 03/07/2020   Primary open-angle glaucoma, right eye, mild stage 03/07/2020   Degeneration of lumbar intervertebral disc 03/07/2020   Radiculopathy due to lumbar intervertebral disc disorder 03/07/2020   Right lower quadrant pain 03/07/2020   S/P TURP (status post transurethral resection of prostate) 03/07/2020   Secondary hyperparathyroidism of renal origin (HCC) 03/07/2020   Senile purpura (HCC) 03/07/2020   Sequelae of other specified infectious and parasitic diseases 03/07/2020   Stricture of artery (HCC) 03/07/2020   Subclinical hypothyroidism 03/07/2020   History of COVID-19 04/11/2019   Shortness of breath 04/11/2019   Irregular heart rhythm 04/11/2019   Fatigue 04/11/2019   Generalized weakness 04/11/2019   Acute respiratory failure with hypoxia (HCC) 02/02/2019   Essential hypertension 02/02/2019   Type 2 diabetes mellitus with complication, without long-term current use of insulin  (HCC) 02/02/2019   Hypokalemia 02/02/2019   BPH (benign prostatic hyperplasia) 12/26/2014    PCP: Olin Bertin, MD  REFERRING PROVIDER: Olin Bertin, MD  THERAPY DIAG:  Unsteadiness on feet  Muscle weakness (generalized)  Other low back pain  Other abnormalities of gait and mobility  Difficulty in walking, not elsewhere classified  REFERRING DIAG: Balance  Rationale for Evaluation and Treatment:  Rehabilitation  SUBJECTIVE:  PERTINENT PAST HISTORY:  DM, Afib, SOB, renal disease, lumbar radiculopathy, Hx of DVT, cardiac amyloidosis       PRECAUTIONS: None  WEIGHT BEARING RESTRICTIONS No  FALLS:  Has patient fallen in last 6 months? No, Number of falls: 0  MOI/History of condition:  Onset date: chronic,  worsening over 2-3 months  SUBJECTIVE STATEMENT  05/04/2023: Pt reports continued improvement and he was able to rake his yard yesterday.  EVAL:  Thomas Joseph is a 89 y.o. male who presents to clinic with chief complaint of general deconditioning, loss of balance, and light headedness.  He denies any LOC, visual changes, or sensation of spinning.  Pt states that his BP was normal when he was checked for orthostatic hypotension.  He does feel like if he sits for a long period the dizziness is worse (2 hrs is much worse than 30 min)  He states that the light headedness is mostly when he stands up after sitting for a long period and then abates within a few minutes.  He feels generally weak.  He states that he walks regularly when he goes to they [facility].      Pain:  Are you having pain? No  Occupation: retired  Education administrator: [employer]  Hand Dominance: na  Patient Goals/Specific Activities: be able to work around [employer].   OBJECTIVE:   GENERAL OBSERVATION/GAIT: Slow shuffling gait  VITALS   BP: 165/65 sitting, 167/80 standing   HR: 55 sitting,  58 standing  LE MMT:  MMT Right (Eval) Left (Eval)  Hip flexion (L2, L3) 4 4  Knee extension (L3) 4 4+  Knee flexion 4+ 4+  Hip abduction    Hip extension    Hip external rotation    Hip internal rotation    Hip adduction    Ankle dorsiflexion (L4) 3+ 5  Ankle plantarflexion (S1)    Ankle inversion    Ankle eversion    Great Toe ext (L5)    Grossly     (Blank rows = not tested, score listed is out of 5 possible points.  N = WNL, D = diminished, C = clear for gross weakness with myotome testing, * = concordant pain with testing)  LE ROM:  ROM Right (Eval) Left (Eval)  Hip flexion    Hip extension    Hip abduction    Hip adduction    Hip internal rotation    Hip external rotation    Knee extension    Knee flexion    Ankle dorsiflexion    Ankle plantarflexion    Ankle inversion    Ankle eversion     (Blank rows  = not tested, N = WNL, * = concordant pain with testing)  Functional Tests  Eval    30'' STS: 6x  UE used? Y    10 m max gait speed: 21'', .48 m/s, AD: SPC    Progressive balance screen (highest level completed for >/= 10''):  Feet together: 10'' Semi Tandem: R in rear 10'', L in rear 10'' unstable Tandem: R in rear unable, L in rear unable    2 MWT: 177'                                               PATIENT SURVEYS:  Patient Specific Functional Scale:  Activity Eval         Getting out of a chair and feeling stable 3         Walking without feeling like he may fall 4                             Average 3.5          (Activities rated 0-10/10.  "10" represents "able to perform at prior level" while "0" represents "unable to perform." )   TODAY'S TREATMENT   Therapeutic Exercise: Creating, reviewing, and completing below HEP  [facility] Adult PT Treatment  05/04/2023:  Therapeutic Exercise:  nu-step L6 79m while taking subjective and planning session with patient Slant board stretch - 1' x2  Therapeutic Activity  STS from chair with 1 airex 10x, with no airex 2x3 Heel raises on step - 3x15 - light UE  Neuromuscular re-ed: Walking with direction and speed changes SL cone step overs, slalom (fwd and retro), and direction changes with cones    HOME EXERCISE PROGRAM: Access Code: [email or web address] URL: [email or web address] Date: 04/19/2023 Prepared by: Lesleigh Rash  Exercises - Standing Hip Abduction with Counter Support  - 1 x daily - 7 x weekly - 3 sets - 10 reps - Heel Raises with Counter Support  - 1 x daily - 7 x weekly - 3 sets - 10 reps - Standing March with Counter Support  - 1 x daily - 7 x weekly - 3 sets -  10 reps - Heel Toe Raises with Counter Support  - 1 x daily - 7 x weekly - 3 sets - 10 reps - Gastroc Stretch on Wall  - 1 x daily - 7 x weekly - 1 sets - 3 reps - 45 seconds hold - Sit to Stand with Counter Support  - 1 x daily - 7 x  weekly - 2-3 sets - 5-10 reps  Treatment priorities   Eval 4/8 4/16      Check DF ROM DF ROM ''      General functional strengthening balance '' Specifically retro      balance Step height ''                       ASSESSMENT:  CLINICAL IMPRESSION:  05/04/2023: Thomas Joseph tolerated session well with no adverse reaction.  We concentrated more on dynamic balance particularly with direction changes and emphasizing increasing step height.  Thomas Joseph is able to consistently clear hurdle with both LE with cuing but without object to step over he has a difficult time increasing avoiding shuffling pattern.  Will his DF ROM does seem to be improving.   EVAL:  Thomas Joseph is a 89 y.o. male who presents to clinic with signs and sxs consistent with instability in gait and weakness.  Pt presents with shuffling gait which worsens with fatigue.  He feels his legs are heavy after walking <1 min.  His dizziness sound most consistent with orthostatic hypotension although he had no significant drop when moving from sitting to standing in clinic today.  He states that it is worse after sitting for >1 hour.  We talked about the need to stay aware of this and avoid walking until the feeling as abated.  Ziyad will benefit from skilled PT to address relevant deficits and improve safety and independence in ambulation  OBJECTIVE IMPAIRMENTS: LE and hip strength, gait, balance  ACTIVITY LIMITATIONS: standing, walking, transfers, housework, yardwork  PERSONAL FACTORS: See medical history and pertinent history   REHAB POTENTIAL: Good  CLINICAL DECISION MAKING: Evolving/moderate complexity  EVALUATION COMPLEXITY: Moderate   GOALS:   SHORT TERM GOALS: Target date: 05/11/2023   Thomas Joseph will be >75% HEP compliant to improve carryover between sessions and facilitate independent management of condition  Evaluation: ongoing Goal status: MET   LONG TERM GOALS: Target date: 06/08/2023    Thomas Joseph will report confidence in  maintenance of balance at time of discharge with advanced HEP  Evaluation/Baseline: unable to self manage Goal status: INITIAL   2.  Thomas Joseph will be able to stand for >15'' in tandem stance, to show a significant improvement in balance in order to reduce fall risk   Evaluation/Baseline: unable Goal status: INITIAL   3.  Thomas Joseph will improve 30'' STS (MCID 2) to >/= 8x (w/ UE?: Y) to show improved LE strength and improved transfers   Evaluation/Baseline: 6x  w/ UE? Y Goal status: INITIAL   4.  Thomas Joseph will improve 10 meter max gait speed to .6 m/s (.1 m/s MCID) to show functional improvement in ambulation   Evaluation/Baseline: .48 m/s Goal status: INITIAL   Norms:     5.  Thomas Joseph will improve two minute walk test to 240 feet (MCID 40 ft).  Evaluation/Baseline: 177 ft Goal status: INITIAL    PLAN: PT FREQUENCY: 1-2x/week  PT DURATION: 8 weeks  PLANNED INTERVENTIONS: Therapeutic exercises, Aquatic therapy, Therapeutic activity, Neuro Muscular re-education, Gait training, Patient/Family education, Joint mobilization, Dry Needling, Electrical stimulation, Spinal  mobilization and/or manipulation, Moist heat, Taping, Vasopneumatic device, Ionotophoresis 4mg /ml Dexamethasone , and Manual therapy   Lesleigh Rash PT, DPT 05/04/2023, 12:45 PM  Date of referral: 3/20 Referring provider: [place] Referring diagnosis? imbalance Treatment diagnosis? (if different than referring diagnosis) unsteadiness on feet R26.81  What was this (referring dx) caused by? Ongoing Issue  Lonne Roan of Condition: Chronic (continuous duration > 3 months)   Laterality: Both  Current Functional Measure Score: Other PSFS: 3.5  Objective measurements identify impairments when they are compared to normal values, the uninvolved extremity, and prior level of function.  [x]  Yes  []  No  Objective assessment of functional ability: Severe functional limitations   Briefly describe symptoms: unsteadiness  on feet, difficulty with transfers, poor strength and endurance  How did symptoms start: slow onset  Average pain intensity:  Last 24 hours: 0  Past week: 0  How often does the pt experience symptoms? Constantly  How much have the symptoms interfered with usual daily activities? Quite a bit  How has condition changed since care began at this facility? NA - initial visit  In general, how is the patients overall health? Fair   BACK PAIN (STarT Back Screening Tool) No

## 2023-05-06 ENCOUNTER — Ambulatory Visit: Admitting: Physical Therapy

## 2023-05-06 ENCOUNTER — Encounter: Payer: Self-pay | Admitting: Physical Therapy

## 2023-05-06 DIAGNOSIS — I43 Cardiomyopathy in diseases classified elsewhere: Secondary | ICD-10-CM | POA: Diagnosis not present

## 2023-05-06 DIAGNOSIS — R2689 Other abnormalities of gait and mobility: Secondary | ICD-10-CM | POA: Diagnosis not present

## 2023-05-06 DIAGNOSIS — R2681 Unsteadiness on feet: Secondary | ICD-10-CM | POA: Diagnosis not present

## 2023-05-06 DIAGNOSIS — E854 Organ-limited amyloidosis: Secondary | ICD-10-CM | POA: Diagnosis not present

## 2023-05-06 DIAGNOSIS — M5459 Other low back pain: Secondary | ICD-10-CM | POA: Diagnosis not present

## 2023-05-06 DIAGNOSIS — R262 Difficulty in walking, not elsewhere classified: Secondary | ICD-10-CM | POA: Diagnosis not present

## 2023-05-06 DIAGNOSIS — M6281 Muscle weakness (generalized): Secondary | ICD-10-CM

## 2023-05-06 NOTE — Therapy (Signed)
 OUTPATIENT PHYSICAL THERAPY DAILY NOTE  Patient Name: Thomas Joseph MRN: [id number] DOB:[birth year], 89 y.o., male Today's Date: 05/06/2023   PT End of Session - 05/06/23 1015     Visit Number 5    Number of Visits 12   1-2x/week   Date for PT Re-Evaluation 06/08/23    Authorization Type [employer] MCR - PSFS    Progress Note Due on Visit 10    PT Start Time 1015    PT Stop Time 1056    PT Time Calculation (min) 41 min             Past Medical History:  Diagnosis Date   Anemia    Arrhythmia    BPH (benign prostatic hyperplasia)    Chronic kidney disease (CKD) stage G3a/A1, moderately decreased glomerular filtration rate (GFR) between 45-59 mL/min/1.73 square meter and albuminuria creatinine ratio less than 30 mg/g (HCC) 03/07/2020   Diabetes mellitus without complication (HCC)    Dyspnea    ED (erectile dysfunction)    Family history of adverse reaction to anesthesia    son had some problem years ago, pt unsure of what exact problem was   Hypertension    Obesity    Pneumonia    Past Surgical History:  Procedure Laterality Date    LEFT KNEE ARTHROCOPY     COLONOSCOPY WITH PROPOFOL  Left 05/07/2020   Procedure: COLONOSCOPY WITH PROPOFOL ;  Surgeon: Felecia Hopper, MD;  Location: [facility] ENDOSCOPY;  Service: Gastroenterology;  Laterality: Left;   COLONOSCOPY WITH PROPOFOL  N/A 05/11/2020   Procedure: COLONOSCOPY WITH PROPOFOL ;  Surgeon: Baldo Bonds, MD;  Location: [facility] ENDOSCOPY;  Service: Endoscopy;  Laterality: N/A;   ESOPHAGOGASTRODUODENOSCOPY N/A 05/06/2020   Procedure: ESOPHAGOGASTRODUODENOSCOPY (EGD);  Surgeon: Evangeline Hilts, MD;  Location: [facility] ENDOSCOPY;  Service: Endoscopy;  Laterality: N/A;   HERNIA REPAIR     IR ANGIOGRAM SELECTIVE EACH ADDITIONAL VESSEL  05/11/2020   IR ANGIOGRAM VISCERAL SELECTIVE  05/11/2020   IR EMBO ART  VEN HEMORR LYMPH EXTRAV  INC GUIDE ROADMAPPING  05/11/2020   IR IVC FILTER PLMT / S&I /IMG GUID/MOD SED  05/11/2020   IR RADIOLOGIST EVAL  & MGMT  08/27/2020   IR RADIOLOGIST EVAL & MGMT  02/26/2021   IR US  GUIDE VASC ACCESS RIGHT  05/11/2020   IR VENOGRAM RENAL BI  05/11/2020   TRANSURETHRAL RESECTION OF PROSTATE N/A 12/26/2014   Procedure: TRANSURETHRAL RESECTION OF THE PROSTATE;  Surgeon: Marco Severs, MD;  Location: [facility] ORS;  Service: Urology;  Laterality: N/A;   Patient Active Problem List   Diagnosis Date Noted   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   Pancreatic cyst 11/02/2022   Protein calorie malnutrition (HCC) 11/02/2022   Acute embolism and thrombosis of unspecified deep veins of unspecified lower extremity (HCC) 05/31/2021   Shoulder joint pain 05/31/2021   Abnormal gait 02/24/2021   Acute deep vein thrombosis (DVT) of calf muscle vein of left lower extremity (HCC) 02/24/2021   Acute pulmonary embolism (HCC) 02/24/2021   Hemorrhage of colon due to diverticulosis 02/24/2021   History of DVT (deep vein thrombosis) 02/24/2021   Malnutrition of mild degree [name]: 75% to less than 90% of standard weight) (HCC) 02/24/2021   Personal history of pulmonary embolism 02/24/2021   Polymyalgia rheumatica (HCC) 02/24/2021   Presence of other vascular implants and grafts 02/24/2021   Urinary hesitancy 02/24/2021   Vitamin B deficiency 02/24/2021   Hypomagnesemia 05/11/2020   Atrial fibrillation with RVR (HCC) 05/11/2020   GI bleed 05/05/2020  AKI (acute kidney injury) (HCC)    2019 novel coronavirus disease (COVID-19) 03/07/2020   Anemia 03/07/2020   Arthropathy 03/07/2020   Body mass index (BMI) 32.0-32.9, adult 03/07/2020   Body mass index (BMI) 34.0-34.9, adult 03/07/2020   Chronic kidney disease (CKD) stage G3a/A1, moderately decreased glomerular filtration rate (GFR) between 45-59 mL/min/1.73 square meter and albuminuria creatinine ratio less than 30 mg/g (HCC) 03/07/2020   Chronic sinusitis 03/07/2020   Constipation 03/07/2020   Diabetic renal disease (HCC) 03/07/2020   Hardening of the aorta (main  artery of the heart) (HCC) 03/07/2020   Hyperlipidemia 03/07/2020   Microscopic hematuria 03/07/2020   Nausea 03/07/2020   Other abnormalities of gait and mobility 03/07/2020   History of colonic polyps 03/07/2020   Pneumonia due to SARS-associated coronavirus 03/07/2020   Primary open-angle glaucoma, right eye, mild stage 03/07/2020   Degeneration of lumbar intervertebral disc 03/07/2020   Radiculopathy due to lumbar intervertebral disc disorder 03/07/2020   Right lower quadrant pain 03/07/2020   S/P TURP (status post transurethral resection of prostate) 03/07/2020   Secondary hyperparathyroidism of renal origin (HCC) 03/07/2020   Senile purpura (HCC) 03/07/2020   Sequelae of other specified infectious and parasitic diseases 03/07/2020   Stricture of artery (HCC) 03/07/2020   Subclinical hypothyroidism 03/07/2020   History of COVID-19 04/11/2019   Shortness of breath 04/11/2019   Irregular heart rhythm 04/11/2019   Fatigue 04/11/2019   Generalized weakness 04/11/2019   Acute respiratory failure with hypoxia (HCC) 02/02/2019   Essential hypertension 02/02/2019   Type 2 diabetes mellitus with complication, without long-term current use of insulin  (HCC) 02/02/2019   Hypokalemia 02/02/2019   BPH (benign prostatic hyperplasia) 12/26/2014    PCP: Olin Bertin, MD  REFERRING PROVIDER: Harrold Lincoln, *  THERAPY DIAG:  Unsteadiness on feet  Muscle weakness (generalized)  Other low back pain  Other abnormalities of gait and mobility  Difficulty in walking, not elsewhere classified  REFERRING DIAG: Balance  Rationale for Evaluation and Treatment:  Rehabilitation  SUBJECTIVE:  PERTINENT PAST HISTORY:  DM, Afib, SOB, renal disease, lumbar radiculopathy, Hx of DVT, cardiac amyloidosis       PRECAUTIONS: None  WEIGHT BEARING RESTRICTIONS No  FALLS:  Has patient fallen in last 6 months? No, Number of falls: 0  MOI/History of condition:  Onset date: chronic,  worsening over 2-3 months  SUBJECTIVE STATEMENT  05/06/2023: Pt reports continued improvement and he was able to rake his yard yesterday.  EVAL:  Thomas Joseph is a 89 y.o. male who presents to clinic with chief complaint of general deconditioning, loss of balance, and light headedness.  He denies any LOC, visual changes, or sensation of spinning.  Pt states that his BP was normal when he was checked for orthostatic hypotension.  He does feel like if he sits for a long period the dizziness is worse (2 hrs is much worse than 30 min)  He states that the light headedness is mostly when he stands up after sitting for a long period and then abates within a few minutes.  He feels generally weak.  He states that he walks regularly when he goes to they [facility]      Pain:  Are you having pain? No  Occupation: retired  Education administrator: [employer]  Hand Dominance: na  Patient Goals/Specific Activities: be able to work around [employer].   OBJECTIVE:   GENERAL OBSERVATION/GAIT: Slow shuffling gait  VITALS   BP: 165/65 sitting, 167/80 standing   HR: 55  sitting, 58 standing  LE MMT:  MMT Right (Eval) Left (Eval)  Hip flexion (L2, L3) 4 4  Knee extension (L3) 4 4+  Knee flexion 4+ 4+  Hip abduction    Hip extension    Hip external rotation    Hip internal rotation    Hip adduction    Ankle dorsiflexion (L4) 3+ 5  Ankle plantarflexion (S1)    Ankle inversion    Ankle eversion    Great Toe ext (L5)    Grossly     (Blank rows = not tested, score listed is out of 5 possible points.  N = WNL, D = diminished, C = clear for gross weakness with myotome testing, * = concordant pain with testing)  LE ROM:  ROM Right (Eval) Left (Eval)  Hip flexion    Hip extension    Hip abduction    Hip adduction    Hip internal rotation    Hip external rotation    Knee extension    Knee flexion    Ankle dorsiflexion    Ankle plantarflexion    Ankle inversion    Ankle eversion     (Blank rows  = not tested, N = WNL, * = concordant pain with testing)  Functional Tests  Eval    30'' STS: 6x  UE used? Y    10 m max gait speed: 21'', .48 m/s, AD: SPC    Progressive balance screen (highest level completed for >/= 10''):  Feet together: 10'' Semi Tandem: R in rear 10'', L in rear 10'' unstable Tandem: R in rear unable, L in rear unable    2 MWT: 177'                                               PATIENT SURVEYS:  Patient Specific Functional Scale:  Activity Eval         Getting out of a chair and feeling stable 3         Walking without feeling like he may fall 4                             Average 3.5          (Activities rated 0-10/10.  "10" represents "able to perform at prior level" while "0" represents "unable to perform." )   TODAY'S TREATMENT   Therapeutic Exercise: Creating, reviewing, and completing below HEP  [facility] Treatment  05/06/2023:  Therapeutic Exercise:  nu-step L8 19m while taking subjective and planning session with patient Slant board stretch - 1' x2  Therapeutic Activity  STS from chair with 1 airex 10x, with no airex 2x5 Step up - 6'' 10x ea With march 10x 2 up 1 down heel raise - 10x ea with mod UE support  Neuromuscular re-ed: SL cone step overs, slalom (fwd, and direction changes with cones)    HOME EXERCISE PROGRAM: Access Code: [email or web address] URL: [email or web address] Date: 04/19/2023 Prepared by: Lesleigh Rash  Exercises - Standing Hip Abduction with Counter Support  - 1 x daily - 7 x weekly - 3 sets - 10 reps - Heel Raises with Counter Support  - 1 x daily - 7 x weekly - 3 sets - 10 reps - Standing March with Counter Support  - 1 x daily - 7  x weekly - 3 sets - 10 reps - Heel Toe Raises with Counter Support  - 1 x daily - 7 x weekly - 3 sets - 10 reps - Gastroc Stretch on Wall  - 1 x daily - 7 x weekly - 1 sets - 3 reps - 45 seconds hold - Sit to Stand with Counter Support  - 1 x daily - 7 x  weekly - 2-3 sets - 5-10 reps  Treatment priorities   Eval 4/8 4/16      Check DF ROM DF ROM ''      General functional strengthening balance '' Specifically retro      balance Step height ''                       ASSESSMENT:  CLINICAL IMPRESSION:  05/06/2023: Porfirio Bristol tolerated session well with no adverse reaction.  Continued concentration on improving step height in gait to reduce fall risk with continued progress, now requiring only min cuing.  Worked on sudden direction changes in cones today with pt able to perform well at slow speed.  EVAL:  Dewain is a 89 y.o. male who presents to clinic with signs and sxs consistent with instability in gait and weakness.  Pt presents with shuffling gait which worsens with fatigue.  He feels his legs are heavy after walking <1 min.  His dizziness sound most consistent with orthostatic hypotension although he had no significant drop when moving from sitting to standing in clinic today.  He states that it is worse after sitting for >1 hour.  We talked about the need to stay aware of this and avoid walking until the feeling as abated.  Stoy will benefit from skilled PT to address relevant deficits and improve safety and independence in ambulation  OBJECTIVE IMPAIRMENTS: LE and hip strength, gait, balance  ACTIVITY LIMITATIONS: standing, walking, transfers, housework, yardwork  PERSONAL FACTORS: See medical history and pertinent history   REHAB POTENTIAL: Good  CLINICAL DECISION MAKING: Evolving/moderate complexity  EVALUATION COMPLEXITY: Moderate   GOALS:   SHORT TERM GOALS: Target date: 05/11/2023   Wilkins will be >75% HEP compliant to improve carryover between sessions and facilitate independent management of condition  Evaluation: ongoing Goal status: MET   LONG TERM GOALS: Target date: 06/08/2023    Gary will report confidence in maintenance of balance at time of discharge with advanced HEP  Evaluation/Baseline: unable to  self manage Goal status: INITIAL   2.  Kaicen will be able to stand for >15'' in tandem stance, to show a significant improvement in balance in order to reduce fall risk   Evaluation/Baseline: unable Goal status: INITIAL   3.  Tiandre will improve 30'' STS (MCID 2) to >/= 8x (w/ UE?: Y) to show improved LE strength and improved transfers   Evaluation/Baseline: 6x  w/ UE? Y Goal status: INITIAL   4.  Zimir will improve 10 meter max gait speed to .6 m/s (.1 m/s MCID) to show functional improvement in ambulation   Evaluation/Baseline: .48 m/s Goal status: INITIAL   Norms:     5.  Jaiel will improve two minute walk test to 240 feet (MCID 40 ft).  Evaluation/Baseline: 177 ft Goal status: INITIAL    PLAN: PT FREQUENCY: 1-2x/week  PT DURATION: 8 weeks  PLANNED INTERVENTIONS: Therapeutic exercises, Aquatic therapy, Therapeutic activity, Neuro Muscular re-education, Gait training, Patient/Family education, Joint mobilization, Dry Needling, Electrical stimulation, Spinal mobilization and/or manipulation, Moist heat, Taping, Vasopneumatic device, Ionotophoresis 4mg /ml  Dexamethasone , and Manual therapy   Lesleigh Rash PT, DPT 05/06/2023, 11:02 AM  Date of referral: 3/20 Referring provider: O'Neil Referring diagnosis? imbalance Treatment diagnosis? (if different than referring diagnosis) unsteadiness on feet R26.81  What was this (referring dx) caused by? Ongoing Issue  Lonne Roan of Condition: Chronic (continuous duration > 3 months)   Laterality: Both  Current Functional Measure Score: Other PSFS: 3.5  Objective measurements identify impairments when they are compared to normal values, the uninvolved extremity, and prior level of function.  [x]  Yes  []  No  Objective assessment of functional ability: Severe functional limitations   Briefly describe symptoms: unsteadiness on feet, difficulty with transfers, poor strength and endurance  How did symptoms start: slow  onset  Average pain intensity:  Last 24 hours: 0  Past week: 0  How often does the pt experience symptoms? Constantly  How much have the symptoms interfered with usual daily activities? Quite a bit  How has condition changed since care began at this facility? NA - initial visit  In general, how is the patients overall health? Fair   BACK PAIN (STarT Back Screening Tool) No

## 2023-05-11 ENCOUNTER — Encounter: Payer: Self-pay | Admitting: Physical Therapy

## 2023-05-11 ENCOUNTER — Ambulatory Visit: Admitting: Physical Therapy

## 2023-05-11 DIAGNOSIS — M6281 Muscle weakness (generalized): Secondary | ICD-10-CM | POA: Diagnosis not present

## 2023-05-11 DIAGNOSIS — I43 Cardiomyopathy in diseases classified elsewhere: Secondary | ICD-10-CM | POA: Diagnosis not present

## 2023-05-11 DIAGNOSIS — R2689 Other abnormalities of gait and mobility: Secondary | ICD-10-CM | POA: Diagnosis not present

## 2023-05-11 DIAGNOSIS — M5459 Other low back pain: Secondary | ICD-10-CM | POA: Diagnosis not present

## 2023-05-11 DIAGNOSIS — R2681 Unsteadiness on feet: Secondary | ICD-10-CM | POA: Diagnosis not present

## 2023-05-11 DIAGNOSIS — E854 Organ-limited amyloidosis: Secondary | ICD-10-CM | POA: Diagnosis not present

## 2023-05-11 DIAGNOSIS — R262 Difficulty in walking, not elsewhere classified: Secondary | ICD-10-CM

## 2023-05-11 NOTE — Therapy (Signed)
 OUTPATIENT PHYSICAL THERAPY DAILY NOTE  Patient Name: Thomas Joseph MRN: [id number] DOB:1933-09-15, 89 y.o., male Today's Date: 05/11/2023   PT End of Session - 05/11/23 1012     Visit Number 6    Number of Visits 12   1-2x/week   Date for PT Re-Evaluation 06/08/23    Authorization Type [employer] MCR - PSFS    Progress Note Due on Visit 10    PT Start Time 1015    PT Stop Time 1056    PT Time Calculation (min) 41 min             Past Medical History:  Diagnosis Date   Anemia    Arrhythmia    BPH (benign prostatic hyperplasia)    Chronic kidney disease (CKD) stage G3a/A1, moderately decreased glomerular filtration rate (GFR) between 45-59 mL/min/1.73 square meter and albuminuria creatinine ratio less than 30 mg/g (HCC) 03/07/2020   Diabetes mellitus without complication (HCC)    Dyspnea    ED (erectile dysfunction)    Family history of adverse reaction to anesthesia    son had some problem years ago, pt unsure of what exact problem was   Hypertension    Obesity    Pneumonia    Past Surgical History:  Procedure Laterality Date    LEFT KNEE ARTHROCOPY     COLONOSCOPY WITH PROPOFOL  Left 05/07/2020   Procedure: COLONOSCOPY WITH PROPOFOL ;  Surgeon: Felecia Hopper, MD;  Location: [facility] ENDOSCOPY;  Service: Gastroenterology;  Laterality: Left;   COLONOSCOPY WITH PROPOFOL  N/A 05/11/2020   Procedure: COLONOSCOPY WITH PROPOFOL ;  Surgeon: Baldo Bonds, MD;  Location: [facility] ENDOSCOPY;  Service: Endoscopy;  Laterality: N/A;   ESOPHAGOGASTRODUODENOSCOPY N/A 05/06/2020   Procedure: ESOPHAGOGASTRODUODENOSCOPY (EGD);  Surgeon: Evangeline Hilts, MD;  Location: [facility] ENDOSCOPY;  Service: Endoscopy;  Laterality: N/A;   HERNIA REPAIR     IR ANGIOGRAM SELECTIVE EACH ADDITIONAL VESSEL  05/11/2020   IR ANGIOGRAM VISCERAL SELECTIVE  05/11/2020   IR EMBO ART  VEN HEMORR LYMPH EXTRAV  INC GUIDE ROADMAPPING  05/11/2020   IR IVC FILTER PLMT / S&I /IMG GUID/MOD SED  05/11/2020   IR RADIOLOGIST EVAL  & MGMT  08/27/2020   IR RADIOLOGIST EVAL & MGMT  02/26/2021   IR US  GUIDE VASC ACCESS RIGHT  05/11/2020   IR VENOGRAM RENAL BI  05/11/2020   TRANSURETHRAL RESECTION OF PROSTATE N/A 12/26/2014   Procedure: TRANSURETHRAL RESECTION OF THE PROSTATE;  Surgeon: Marco Severs, MD;  Location: [facility] ORS;  Service: Urology;  Laterality: N/A;   Patient Active Problem List   Diagnosis Date Noted   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   Pancreatic cyst 11/02/2022   Protein calorie malnutrition (HCC) 11/02/2022   Acute embolism and thrombosis of unspecified deep veins of unspecified lower extremity (HCC) 05/31/2021   Shoulder joint pain 05/31/2021   Abnormal gait 02/24/2021   Acute deep vein thrombosis (DVT) of calf muscle vein of left lower extremity (HCC) 02/24/2021   Acute pulmonary embolism (HCC) 02/24/2021   Hemorrhage of colon due to diverticulosis 02/24/2021   History of DVT (deep vein thrombosis) 02/24/2021   Malnutrition of mild degree [name]: 75% to less than 90% of standard weight) (HCC) 02/24/2021   Personal history of pulmonary embolism 02/24/2021   Polymyalgia rheumatica (HCC) 02/24/2021   Presence of other vascular implants and grafts 02/24/2021   Urinary hesitancy 02/24/2021   Vitamin B deficiency 02/24/2021   Hypomagnesemia 05/11/2020   Atrial fibrillation with RVR (HCC) 05/11/2020   GI bleed 05/05/2020  AKI (acute kidney injury) (HCC)    2019 novel coronavirus disease (COVID-19) 03/07/2020   Anemia 03/07/2020   Arthropathy 03/07/2020   Body mass index (BMI) 32.0-32.9, adult 03/07/2020   Body mass index (BMI) 34.0-34.9, adult 03/07/2020   Chronic kidney disease (CKD) stage G3a/A1, moderately decreased glomerular filtration rate (GFR) between 45-59 mL/min/1.73 square meter and albuminuria creatinine ratio less than 30 mg/g (HCC) 03/07/2020   Chronic sinusitis 03/07/2020   Constipation 03/07/2020   Diabetic renal disease (HCC) 03/07/2020   Hardening of the aorta (main  artery of the heart) (HCC) 03/07/2020   Hyperlipidemia 03/07/2020   Microscopic hematuria 03/07/2020   Nausea 03/07/2020   Other abnormalities of gait and mobility 03/07/2020   History of colonic polyps 03/07/2020   Pneumonia due to SARS-associated coronavirus 03/07/2020   Primary open-angle glaucoma, right eye, mild stage 03/07/2020   Degeneration of lumbar intervertebral disc 03/07/2020   Radiculopathy due to lumbar intervertebral disc disorder 03/07/2020   Right lower quadrant pain 03/07/2020   S/P TURP (status post transurethral resection of prostate) 03/07/2020   Secondary hyperparathyroidism of renal origin (HCC) 03/07/2020   Senile purpura (HCC) 03/07/2020   Sequelae of other specified infectious and parasitic diseases 03/07/2020   Stricture of artery (HCC) 03/07/2020   Subclinical hypothyroidism 03/07/2020   History of COVID-19 04/11/2019   Shortness of breath 04/11/2019   Irregular heart rhythm 04/11/2019   Fatigue 04/11/2019   Generalized weakness 04/11/2019   Acute respiratory failure with hypoxia (HCC) 02/02/2019   Essential hypertension 02/02/2019   Type 2 diabetes mellitus with complication, without long-term current use of insulin  (HCC) 02/02/2019   Hypokalemia 02/02/2019   BPH (benign prostatic hyperplasia) 12/26/2014    PCP: Olin Bertin, MD  REFERRING PROVIDER: Olin Bertin, MD  THERAPY DIAG:  Unsteadiness on feet  Muscle weakness (generalized)  Other low back pain  Other abnormalities of gait and mobility  Difficulty in walking, not elsewhere classified  REFERRING DIAG: Balance  Rationale for Evaluation and Treatment:  Rehabilitation  SUBJECTIVE:  PERTINENT PAST HISTORY:  DM, Afib, SOB, renal disease, lumbar radiculopathy, Hx of DVT, cardiac amyloidosis       PRECAUTIONS: None  WEIGHT BEARING RESTRICTIONS No  FALLS:  Has patient fallen in last 6 months? No, Number of falls: 0  MOI/History of condition:  Onset date: chronic,  worsening over 2-3 months  SUBJECTIVE STATEMENT  05/11/2023: Pt reports that he continues to see improvements.  He has been doing some work in the yard.  His light headedness is improving but he still feels off balance, particularly with turns and walking backwards.  EVAL:  Thomas Joseph is a 89 y.o. male who presents to clinic with chief complaint of general deconditioning, loss of balance, and light headedness.  He denies any LOC, visual changes, or sensation of spinning.  Pt states that his BP was normal when he was checked for orthostatic hypotension.  He does feel like if he sits for a long period the dizziness is worse (2 hrs is much worse than 30 min)  He states that the light headedness is mostly when he stands up after sitting for a long period and then abates within a few minutes.  He feels generally weak.  He states that he walks regularly when he goes to [facility]      Pain:  Are you having pain? No  Occupation: retired  Education administrator: [employer]  Hand Dominance: na  Patient Goals/Specific Activities: be able to work around [employer].  OBJECTIVE:   GENERAL OBSERVATION/GAIT: Slow shuffling gait  VITALS   BP: 165/65 sitting, 167/80 standing   HR: 55 sitting, 58 standing  LE MMT:  MMT Right (Eval) Left (Eval)  Hip flexion (L2, L3) 4 4  Knee extension (L3) 4 4+  Knee flexion 4+ 4+  Hip abduction    Hip extension    Hip external rotation    Hip internal rotation    Hip adduction    Ankle dorsiflexion (L4) 3+ 5  Ankle plantarflexion (S1)    Ankle inversion    Ankle eversion    Great Toe ext (L5)    Grossly     (Blank rows = not tested, score listed is out of 5 possible points.  N = WNL, D = diminished, C = clear for gross weakness with myotome testing, * = concordant pain with testing)  LE ROM:  ROM Right (Eval) Left (Eval)  Hip flexion    Hip extension    Hip abduction    Hip adduction    Hip internal rotation    Hip external rotation    Knee  extension    Knee flexion    Ankle dorsiflexion    Ankle plantarflexion    Ankle inversion    Ankle eversion     (Blank rows = not tested, N = WNL, * = concordant pain with testing)  Functional Tests  Eval    30'' STS: 6x  UE used? Y    10 m max gait speed: 21'', .48 m/s, AD: SPC    Progressive balance screen (highest level completed for >/= 10''):  Feet together: 10'' Semi Tandem: R in rear 10'', L in rear 10'' unstable Tandem: R in rear unable, L in rear unable    2 MWT: 177'                                               PATIENT SURVEYS:  Patient Specific Functional Scale:  Activity Eval         Getting out of a chair and feeling stable 3         Walking without feeling like he may fall 4                             Average 3.5          (Activities rated 0-10/10.  "10" represents "able to perform at prior level" while "0" represents "unable to perform." )   TODAY'S TREATMENT   Therapeutic Exercise: Creating, reviewing, and completing below HEP  [facility] Adult PT Treatment  05/11/2023:  Therapeutic Exercise:  nu-step L8 87m while taking subjective and planning session with patient Slant board stretch - 1' x2  Therapeutic Activity  Obstacle course including sit to stand, step up 6'', hurdle walking, and cone taps  Neuromuscular re-ed: Work on step strategies using large steps fwd, lat, and bkwd with CGA FT on foam with fwd, rotation, and OH reaches with yellow ball    HOME EXERCISE PROGRAM: Access Code: [email or web address] URL: [email or web address] Date: 04/19/2023 Prepared by: Lesleigh Rash  Exercises - Standing Hip Abduction with Counter Support  - 1 x daily - 7 x weekly - 3 sets - 10 reps - Heel Raises with Counter Support  - 1 x daily - 7 x weekly - 3 sets -  10 reps - Standing March with Counter Support  - 1 x daily - 7 x weekly - 3 sets - 10 reps - Heel Toe Raises with Counter Support  - 1 x daily - 7 x weekly - 3 sets - 10 reps -  Gastroc Stretch on Wall  - 1 x daily - 7 x weekly - 1 sets - 3 reps - 45 seconds hold - Sit to Stand with Counter Support  - 1 x daily - 7 x weekly - 2-3 sets - 5-10 reps  Treatment priorities   Eval 4/8 4/16      Check DF ROM DF ROM ''      General functional strengthening balance ''  Specifically retro      balance Step height ''                       ASSESSMENT:  CLINICAL IMPRESSION:  05/11/2023: Thomas Joseph tolerated session well with no adverse reaction.  Worked on step strategy to correct for balance loss today.  Thomas Joseph does well with lateral stepping but has significant trouble with bkwd > fwd steps.  This does improve with practice but he requires CGA to Min A for safety.  We integrated obstacle course to work on functional balance and endurance.  Pt reports fatigue following session.  EVAL:  Thomas Joseph is a 89 y.o. male who presents to clinic with signs and sxs consistent with instability in gait and weakness.  Pt presents with shuffling gait which worsens with fatigue.  He feels his legs are heavy after walking <1 min.  His dizziness sound most consistent with orthostatic hypotension although he had no significant drop when moving from sitting to standing in clinic today.  He states that it is worse after sitting for >1 hour.  We talked about the need to stay aware of this and avoid walking until the feeling as abated.  Varun will benefit from skilled PT to address relevant deficits and improve safety and independence in ambulation  OBJECTIVE IMPAIRMENTS: LE and hip strength, gait, balance  ACTIVITY LIMITATIONS: standing, walking, transfers, housework, yardwork  PERSONAL FACTORS: See medical history and pertinent history   REHAB POTENTIAL: Good  CLINICAL DECISION MAKING: Evolving/moderate complexity  EVALUATION COMPLEXITY: Moderate   GOALS:   SHORT TERM GOALS: Target date: 05/11/2023   Thomas Joseph will be >75% HEP compliant to improve carryover between sessions and facilitate  independent management of condition  Evaluation: ongoing Goal status: MET   LONG TERM GOALS: Target date: 06/08/2023    Thomas Joseph will report confidence in maintenance of balance at time of discharge with advanced HEP  Evaluation/Baseline: unable to self manage Goal status: INITIAL   2.  Thomas Joseph will be able to stand for >15'' in tandem stance, to show a significant improvement in balance in order to reduce fall risk   Evaluation/Baseline: unable Goal status: INITIAL   3.  Thomas Joseph will improve 30'' STS (MCID 2) to >/= 8x (w/ UE?: Y) to show improved LE strength and improved transfers   Evaluation/Baseline: 6x  w/ UE? Y Goal status: INITIAL   4.  Thomas Joseph will improve 10 meter max gait speed to .6 m/s (.1 m/s MCID) to show functional improvement in ambulation   Evaluation/Baseline: .48 m/s Goal status: INITIAL   Norms:     5.  Thomas Joseph will improve two minute walk test to 240 feet (MCID 40 ft).  Evaluation/Baseline: 177 ft Goal status: INITIAL    PLAN: PT FREQUENCY: 1-2x/week  PT DURATION: 8 weeks  PLANNED INTERVENTIONS: Therapeutic exercises, Aquatic therapy, Therapeutic activity, Neuro Muscular re-education, Gait training, Patient/Family education, Joint mobilization, Dry Needling, Electrical stimulation, Spinal mobilization and/or manipulation, Moist heat, Taping, Vasopneumatic device, Ionotophoresis 4mg /ml Dexamethasone , and Manual therapy   Lesleigh Rash PT, DPT 05/11/2023, 12:48 PM  Date of referral: 3/20 Referring provider: [place] Referring diagnosis? imbalance Treatment diagnosis? (if different than referring diagnosis) unsteadiness on feet R26.81  What was this (referring dx) caused by? Ongoing Issue  Lonne Roan of Condition: Chronic (continuous duration > 3 months)   Laterality: Both  Current Functional Measure Score: Other PSFS: 3.5  Objective measurements identify impairments when they are compared to normal values, the uninvolved extremity, and  prior level of function.  [x]  Yes  []  No  Objective assessment of functional ability: Severe functional limitations   Briefly describe symptoms: unsteadiness on feet, difficulty with transfers, poor strength and endurance  How did symptoms start: slow onset  Average pain intensity:  Last 24 hours: 0  Past week: 0  How often does the pt experience symptoms? Constantly  How much have the symptoms interfered with usual daily activities? Quite a bit  How has condition changed since care began at this facility? NA - initial visit  In general, how is the patients overall health? Fair   BACK PAIN (STarT Back Screening Tool) No

## 2023-05-13 ENCOUNTER — Encounter: Payer: Self-pay | Admitting: Physical Therapy

## 2023-05-13 ENCOUNTER — Ambulatory Visit: Attending: Cardiovascular Disease | Admitting: Physical Therapy

## 2023-05-13 DIAGNOSIS — R262 Difficulty in walking, not elsewhere classified: Secondary | ICD-10-CM | POA: Diagnosis not present

## 2023-05-13 DIAGNOSIS — R2681 Unsteadiness on feet: Secondary | ICD-10-CM | POA: Insufficient documentation

## 2023-05-13 DIAGNOSIS — M6281 Muscle weakness (generalized): Secondary | ICD-10-CM | POA: Diagnosis not present

## 2023-05-13 DIAGNOSIS — M5459 Other low back pain: Secondary | ICD-10-CM | POA: Diagnosis not present

## 2023-05-13 DIAGNOSIS — R2689 Other abnormalities of gait and mobility: Secondary | ICD-10-CM | POA: Diagnosis not present

## 2023-05-13 NOTE — Therapy (Signed)
 OUTPATIENT PHYSICAL THERAPY DAILY NOTE  Patient Name: Thomas Joseph MRN: [id number] DOB:[birth year], 89 y.o., male Today's Date: 05/13/2023   PT End of Session - 05/13/23 1017     Visit Number 7    Number of Visits 12   1-2x/week   Date for PT Re-Evaluation 06/08/23    Authorization Type [employer] MCR - PSFS    Progress Note Due on Visit 10    PT Start Time 1015    PT Stop Time 1057    PT Time Calculation (min) 42 min             Past Medical History:  Diagnosis Date   Anemia    Arrhythmia    BPH (benign prostatic hyperplasia)    Chronic kidney disease (CKD) stage G3a/A1, moderately decreased glomerular filtration rate (GFR) between 45-59 mL/min/1.73 square meter and albuminuria creatinine ratio less than 30 mg/g (HCC) 03/07/2020   Diabetes mellitus without complication (HCC)    Dyspnea    ED (erectile dysfunction)    Family history of adverse reaction to anesthesia    son had some problem years ago, pt unsure of what exact problem was   Hypertension    Obesity    Pneumonia    Past Surgical History:  Procedure Laterality Date    LEFT KNEE ARTHROCOPY     COLONOSCOPY WITH PROPOFOL  Left 05/07/2020   Procedure: COLONOSCOPY WITH PROPOFOL ;  Surgeon: Felecia Hopper, MD;  Location: [facility] ENDOSCOPY;  Service: Gastroenterology;  Laterality: Left;   COLONOSCOPY WITH PROPOFOL  N/A 05/11/2020   Procedure: COLONOSCOPY WITH PROPOFOL ;  Surgeon: Baldo Bonds, MD;  Location: [facility] ENDOSCOPY;  Service: Endoscopy;  Laterality: N/A;   ESOPHAGOGASTRODUODENOSCOPY N/A 05/06/2020   Procedure: ESOPHAGOGASTRODUODENOSCOPY (EGD);  Surgeon: Evangeline Hilts, MD;  Location: [facility] ENDOSCOPY;  Service: Endoscopy;  Laterality: N/A;   HERNIA REPAIR     IR ANGIOGRAM SELECTIVE EACH ADDITIONAL VESSEL  05/11/2020   IR ANGIOGRAM VISCERAL SELECTIVE  05/11/2020   IR EMBO ART  VEN HEMORR LYMPH EXTRAV  INC GUIDE ROADMAPPING  05/11/2020   IR IVC FILTER PLMT / S&I /IMG GUID/MOD SED  05/11/2020   IR RADIOLOGIST EVAL &  MGMT  08/27/2020   IR RADIOLOGIST EVAL & MGMT  02/26/2021   IR US  GUIDE VASC ACCESS RIGHT  05/11/2020   IR VENOGRAM RENAL BI  05/11/2020   TRANSURETHRAL RESECTION OF PROSTATE N/A 12/26/2014   Procedure: TRANSURETHRAL RESECTION OF THE PROSTATE;  Surgeon: Marco Severs, MD;  Location: [facility] ORS;  Service: Urology;  Laterality: N/A;   Patient Active Problem List   Diagnosis Date Noted   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   Pancreatic cyst 11/02/2022   Protein calorie malnutrition (HCC) 11/02/2022   Acute embolism and thrombosis of unspecified deep veins of unspecified lower extremity (HCC) 05/31/2021   Shoulder joint pain 05/31/2021   Abnormal gait 02/24/2021   Acute deep vein thrombosis (DVT) of calf muscle vein of left lower extremity (HCC) 02/24/2021   Acute pulmonary embolism (HCC) 02/24/2021   Hemorrhage of colon due to diverticulosis 02/24/2021   History of DVT (deep vein thrombosis) 02/24/2021   Malnutrition of mild degree Thomas Joseph: 75% to less than 90% of standard weight) (HCC) 02/24/2021   Personal history of pulmonary embolism 02/24/2021   Polymyalgia rheumatica (HCC) 02/24/2021   Presence of other vascular implants and grafts 02/24/2021   Urinary hesitancy 02/24/2021   Vitamin B deficiency 02/24/2021   Hypomagnesemia 05/11/2020   Atrial fibrillation with RVR (HCC) 05/11/2020   GI bleed 05/05/2020  AKI (acute kidney injury) (HCC)    2019 novel coronavirus disease (COVID-19) 03/07/2020   Anemia 03/07/2020   Arthropathy 03/07/2020   Body mass index (BMI) 32.0-32.9, adult 03/07/2020   Body mass index (BMI) 34.0-34.9, adult 03/07/2020   Chronic kidney disease (CKD) stage G3a/A1, moderately decreased glomerular filtration rate (GFR) between 45-59 mL/min/1.73 square meter and albuminuria creatinine ratio less than 30 mg/g (HCC) 03/07/2020   Chronic sinusitis 03/07/2020   Constipation 03/07/2020   Diabetic renal disease (HCC) 03/07/2020   Hardening of the aorta (main  artery of the heart) (HCC) 03/07/2020   Hyperlipidemia 03/07/2020   Microscopic hematuria 03/07/2020   Nausea 03/07/2020   Other abnormalities of gait and mobility 03/07/2020   History of colonic polyps 03/07/2020   Pneumonia due to SARS-associated coronavirus 03/07/2020   Primary open-angle glaucoma, right eye, mild stage 03/07/2020   Degeneration of lumbar intervertebral disc 03/07/2020   Radiculopathy due to lumbar intervertebral disc disorder 03/07/2020   Right lower quadrant pain 03/07/2020   S/P TURP (status post transurethral resection of prostate) 03/07/2020   Secondary hyperparathyroidism of renal origin (HCC) 03/07/2020   Senile purpura (HCC) 03/07/2020   Sequelae of other specified infectious and parasitic diseases 03/07/2020   Stricture of artery (HCC) 03/07/2020   Subclinical hypothyroidism 03/07/2020   History of COVID-19 04/11/2019   Shortness of breath 04/11/2019   Irregular heart rhythm 04/11/2019   Fatigue 04/11/2019   Generalized weakness 04/11/2019   Acute respiratory failure with hypoxia (HCC) 02/02/2019   Essential hypertension 02/02/2019   Type 2 diabetes mellitus with complication, without long-term current use of insulin  (HCC) 02/02/2019   Hypokalemia 02/02/2019   BPH (benign prostatic hyperplasia) 12/26/2014    PCP: Olin Bertin, MD  REFERRING PROVIDER: Harrold Lincoln, *  THERAPY DIAG:  Unsteadiness on feet  Muscle weakness (generalized)  REFERRING DIAG: Balance  Rationale for Evaluation and Treatment:  Rehabilitation  SUBJECTIVE:  PERTINENT PAST HISTORY:  DM, Afib, SOB, renal disease, lumbar radiculopathy, Hx of DVT, cardiac amyloidosis       PRECAUTIONS: None  WEIGHT BEARING RESTRICTIONS No  FALLS:  Has patient fallen in last 6 months? No, Number of falls: 0  MOI/History of condition:  Onset date: chronic, worsening over 2-3 months  SUBJECTIVE STATEMENT  05/13/2023: Pt reports that he continues to see improvements.  He  has been doing some work in the yard.  His light headedness is improving but he still feels off balance, particularly with turns and walking backwards.  EVAL:  Thomas Joseph is a 89 y.o. male who presents to clinic with chief complaint of general deconditioning, loss of balance, and light headedness.  He denies any LOC, visual changes, or sensation of spinning.  Pt states that his BP was normal when he was checked for orthostatic hypotension.  He does feel like if he sits for a long period the dizziness is worse (2 hrs is much worse than 30 min)  He states that the light headedness is mostly when he stands up after sitting for a long period and then abates within a few minutes.  He feels generally weak.  He states that he walks regularly when he goes to they [facility].      Pain:  Are you having pain? No  Occupation: retired  Education administrator: [employer]  Hand Dominance: na  Patient Goals/Specific Activities: be able to work around [employer].   OBJECTIVE:   GENERAL OBSERVATION/GAIT: Slow shuffling gait  VITALS   BP: 165/65 sitting, 167/80 standing  HR: 55 sitting, 58 standing  LE MMT:  MMT Right (Eval) Left (Eval)  Hip flexion (L2, L3) 4 4  Knee extension (L3) 4 4+  Knee flexion 4+ 4+  Hip abduction    Hip extension    Hip external rotation    Hip internal rotation    Hip adduction    Ankle dorsiflexion (L4) 3+ 5  Ankle plantarflexion (S1)    Ankle inversion    Ankle eversion    Great Toe ext (L5)    Grossly     (Blank rows = not tested, score listed is out of 5 possible points.  N = WNL, D = diminished, C = clear for gross weakness with myotome testing, * = concordant pain with testing)  LE ROM:  ROM Right (Eval) Left (Eval)  Hip flexion    Hip extension    Hip abduction    Hip adduction    Hip internal rotation    Hip external rotation    Knee extension    Knee flexion    Ankle dorsiflexion    Ankle plantarflexion    Ankle inversion    Ankle eversion      (Blank rows = not tested, N = WNL, * = concordant pain with testing)  Functional Tests  Eval    30'' STS: 6x  UE used? Y    10 m max gait speed: 21'', .48 m/s, AD: SPC    Progressive balance screen (highest level completed for >/= 10''):  Feet together: 10'' Semi Tandem: R in rear 10'', L in rear 10'' unstable Tandem: R in rear unable, L in rear unable    2 MWT: 177'                                               PATIENT SURVEYS:  Patient Specific Functional Scale:  Activity Eval         Getting out of a chair and feeling stable 3         Walking without feeling like he may fall 4                             Average 3.5          (Activities rated 0-10/10.  "10" represents "able to perform at prior level" while "0" represents "unable to perform." )   TODAY'S TREATMENT   Therapeutic Exercise: Creating, reviewing, and completing below HEP  [facility] Adult PT Treatment  05/13/2023:  Therapeutic Exercise:  nu-step L8 58m while taking subjective and planning session with patient Runners stretch in //bars x 2  Heel raises  Therapeutic Activity  sit to stand x 10 from elevated seat, no UE step up 6'' x 10 each with 1 UE Dynamic mini lunges in parallel bars using UE  Forward ambulation in // bars with Single UE focusing on increased step length Side stepping in // bars without UE   Neuromuscular re-ed: hurdle walking (1 UE for step over pattern, no UE for step to pattern)  Narrow stance on foam with head turns , alternating OH reaches, Alternating cross body reaches cone taps alternating with 1 UE, progressing to no UE    HOME EXERCISE PROGRAM: Access Code: [email or web address] URL: [email or web address] Date: 04/19/2023 Prepared by: Lesleigh Rash  Exercises - Standing Hip Abduction with Counter  Support  - 1 x daily - 7 x weekly - 3 sets - 10 reps - Heel Raises with Counter Support  - 1 x daily - 7 x weekly - 3 sets - 10 reps - Standing March with  Counter Support  - 1 x daily - 7 x weekly - 3 sets - 10 reps - Heel Toe Raises with Counter Support  - 1 x daily - 7 x weekly - 3 sets - 10 reps - Gastroc Stretch on Wall  - 1 x daily - 7 x weekly - 1 sets - 3 reps - 45 seconds hold - Sit to Stand with Counter Support  - 1 x daily - 7 x weekly - 2-3 sets - 5-10 reps  Treatment priorities   Eval 4/8 4/16      Check DF ROM DF ROM ''      General functional strengthening balance ''  Specifically retro      balance Step height ''                       ASSESSMENT:  CLINICAL IMPRESSION:  05/13/2023: Porfirio Bristol tolerated session well with no adverse reaction.  Pt remained in closed chain for most of session except several brief seated rest breaks. Continued with functional balance and endurance with CGA. He was able to complete hurdle step overs and cone taps without UE assist. Cues given for step length with gait. On unlevel surface and in narrow stance, he can tolerate min dynamic movement without LOB.   EVAL:  Gedalya is a 89 y.o. male who presents to clinic with signs and sxs consistent with instability in gait and weakness.  Pt presents with shuffling gait which worsens with fatigue.  He feels his legs are heavy after walking <1 min.  His dizziness sound most consistent with orthostatic hypotension although he had no significant drop when moving from sitting to standing in clinic today.  He states that it is worse after sitting for >1 hour.  We talked about the need to stay aware of this and avoid walking until the feeling as abated.  Taki will benefit from skilled PT to address relevant deficits and improve safety and independence in ambulation  OBJECTIVE IMPAIRMENTS: LE and hip strength, gait, balance  ACTIVITY LIMITATIONS: standing, walking, transfers, housework, yardwork  PERSONAL FACTORS: See medical history and pertinent history   REHAB POTENTIAL: Good  CLINICAL DECISION MAKING: Evolving/moderate complexity  EVALUATION COMPLEXITY:  Moderate   GOALS:   SHORT TERM GOALS: Target date: 05/11/2023   Elwood will be >75% HEP compliant to improve carryover between sessions and facilitate independent management of condition  Evaluation: ongoing Goal status: MET   LONG TERM GOALS: Target date: 06/08/2023    Breyon will report confidence in maintenance of balance at time of discharge with advanced HEP  Evaluation/Baseline: unable to self manage Goal status: INITIAL   2.  Jonadab will be able to stand for >15'' in tandem stance, to show a significant improvement in balance in order to reduce fall risk   Evaluation/Baseline: unable Goal status: INITIAL   3.  Nashawn will improve 30'' STS (MCID 2) to >/= 8x (w/ UE?: Y) to show improved LE strength and improved transfers   Evaluation/Baseline: 6x  w/ UE? Y Goal status: INITIAL   4.  Brennan will improve 10 meter max gait speed to .6 m/s (.1 m/s MCID) to show functional improvement in ambulation   Evaluation/Baseline: .48 m/s Goal status: INITIAL  Norms:     5.  Lejon will improve two minute walk test to 240 feet (MCID 40 ft).  Evaluation/Baseline: 177 ft Goal status: INITIAL    PLAN: PT FREQUENCY: 1-2x/week  PT DURATION: 8 weeks  PLANNED INTERVENTIONS: Therapeutic exercises, Aquatic therapy, Therapeutic activity, Neuro Muscular re-education, Gait training, Patient/Family education, Joint mobilization, Dry Needling, Electrical stimulation, Spinal mobilization and/or manipulation, Moist heat, Taping, Vasopneumatic device, Ionotophoresis 4mg /ml Dexamethasone , and Manual therapy   Gasper Karst, PTA 05/13/23 11:04 AM Phone: [phone] Fax: [phone]   Date of referral: 3/20 Referring provider: O'Neil Referring diagnosis? imbalance Treatment diagnosis? (if different than referring diagnosis) unsteadiness on feet R26.81  What was this (referring dx) caused by? Ongoing Issue  Lonne Roan of Condition: Chronic (continuous duration > 3  months)   Laterality: Both  Current Functional Measure Score: Other PSFS: 3.5  Objective measurements identify impairments when they are compared to normal values, the uninvolved extremity, and prior level of function.  [x]  Yes  []  No  Objective assessment of functional ability: Severe functional limitations   Briefly describe symptoms: unsteadiness on feet, difficulty with transfers, poor strength and endurance  How did symptoms start: slow onset  Average pain intensity:  Last 24 hours: 0  Past week: 0  How often does the pt experience symptoms? Constantly  How much have the symptoms interfered with usual daily activities? Quite a bit  How has condition changed since care began at this facility? NA - initial visit  In general, how is the patients overall health? Fair   BACK PAIN (STarT Back Screening Tool) No

## 2023-05-18 ENCOUNTER — Other Ambulatory Visit: Payer: Self-pay

## 2023-05-18 NOTE — Progress Notes (Signed)
 Specialty Pharmacy Refill Coordination Note  Thomas Joseph is a 89 y.o. male contacted today regarding refills of specialty medication(s) Acoramidis HCl Lennon Race)   Patient requested Delivery   Delivery date: 05/20/23   Verified address: [address]   Medication will be filled on 05.08.25.

## 2023-05-19 ENCOUNTER — Other Ambulatory Visit: Payer: Self-pay

## 2023-05-20 ENCOUNTER — Encounter: Payer: Self-pay | Admitting: Physical Therapy

## 2023-05-20 ENCOUNTER — Ambulatory Visit: Admitting: Physical Therapy

## 2023-05-20 DIAGNOSIS — R262 Difficulty in walking, not elsewhere classified: Secondary | ICD-10-CM | POA: Diagnosis not present

## 2023-05-20 DIAGNOSIS — M6281 Muscle weakness (generalized): Secondary | ICD-10-CM

## 2023-05-20 DIAGNOSIS — R2681 Unsteadiness on feet: Secondary | ICD-10-CM | POA: Diagnosis not present

## 2023-05-20 DIAGNOSIS — R2689 Other abnormalities of gait and mobility: Secondary | ICD-10-CM | POA: Diagnosis not present

## 2023-05-20 DIAGNOSIS — M5459 Other low back pain: Secondary | ICD-10-CM | POA: Diagnosis not present

## 2023-05-20 NOTE — Therapy (Signed)
 OUTPATIENT PHYSICAL THERAPY DAILY NOTE  Patient Name: Thomas Joseph MRN: [id number] DOB:October 16, 1933, 89 y.o., male Today's Date: 05/20/2023   PT End of Session - 05/20/23 1104     Visit Number 8    Number of Visits 12    Date for PT Re-Evaluation 06/08/23    Authorization Type [employer] MCR - PSFS    Progress Note Due on Visit 10    PT Joseph Time 1102    PT Stop Time 1145    PT Time Calculation (min) 43 min             Past Medical History:  Diagnosis Date   Anemia    Arrhythmia    BPH (benign prostatic hyperplasia)    Chronic kidney disease (CKD) stage G3a/A1, moderately decreased glomerular filtration rate (GFR) between 45-59 mL/min/1.73 square meter Thomas albuminuria creatinine ratio less than 30 mg/g (HCC) 03/07/2020   Diabetes mellitus without complication (HCC)    Dyspnea    ED (erectile dysfunction)    Family history of adverse reaction to anesthesia    son had some problem years ago, pt unsure of what exact problem was   Hypertension    Obesity    Pneumonia    Past Surgical History:  Procedure Laterality Date    LEFT KNEE ARTHROCOPY     COLONOSCOPY WITH PROPOFOL  Left 05/07/2020   Procedure: COLONOSCOPY WITH PROPOFOL ;  Surgeon: Felecia Hopper, MD;  Location: [facility] ENDOSCOPY;  Service: Gastroenterology;  Laterality: Left;   COLONOSCOPY WITH PROPOFOL  N/A 05/11/2020   Procedure: COLONOSCOPY WITH PROPOFOL ;  Surgeon: Baldo Bonds, MD;  Location: [facility] ENDOSCOPY;  Service: Endoscopy;  Laterality: N/A;   ESOPHAGOGASTRODUODENOSCOPY N/A 05/06/2020   Procedure: ESOPHAGOGASTRODUODENOSCOPY (EGD);  Surgeon: Evangeline Hilts, MD;  Location: [facility] ENDOSCOPY;  Service: Endoscopy;  Laterality: N/A;   HERNIA REPAIR     IR ANGIOGRAM SELECTIVE EACH ADDITIONAL VESSEL  05/11/2020   IR ANGIOGRAM VISCERAL SELECTIVE  05/11/2020   IR EMBO ART  VEN HEMORR LYMPH EXTRAV  INC GUIDE ROADMAPPING  05/11/2020   IR IVC FILTER PLMT / S&I /IMG GUID/MOD SED  05/11/2020   IR RADIOLOGIST EVAL & MGMT   08/27/2020   IR RADIOLOGIST EVAL & MGMT  02/26/2021   IR US  GUIDE VASC ACCESS RIGHT  05/11/2020   IR VENOGRAM RENAL BI  05/11/2020   TRANSURETHRAL RESECTION OF PROSTATE N/A 12/26/2014   Procedure: TRANSURETHRAL RESECTION OF THE PROSTATE;  Surgeon: Marco Severs, MD;  Location: [facility] ORS;  Service: Urology;  Laterality: N/A;   Patient Active Problem List   Diagnosis Date Noted   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   Pancreatic cyst 11/02/2022   Protein calorie malnutrition (HCC) 11/02/2022   Acute embolism Thomas thrombosis of unspecified deep veins of unspecified lower extremity (HCC) 05/31/2021   Shoulder joint pain 05/31/2021   Abnormal gait 02/24/2021   Acute deep vein thrombosis (DVT) of calf muscle vein of left lower extremity (HCC) 02/24/2021   Acute pulmonary embolism (HCC) 02/24/2021   Hemorrhage of colon due to diverticulosis 02/24/2021   History of DVT (deep vein thrombosis) 02/24/2021   Malnutrition of mild degree Thomas Joseph: 75% to less than 90% of standard weight) (HCC) 02/24/2021   Personal history of pulmonary embolism 02/24/2021   Polymyalgia rheumatica (HCC) 02/24/2021   Presence of other vascular implants Thomas grafts 02/24/2021   Urinary hesitancy 02/24/2021   Vitamin B deficiency 02/24/2021   Hypomagnesemia 05/11/2020   Atrial fibrillation with RVR (HCC) 05/11/2020   GI bleed 05/05/2020   AKI (  acute kidney injury) (HCC)    2019 novel coronavirus disease (COVID-19) 03/07/2020   Anemia 03/07/2020   Arthropathy 03/07/2020   Body mass index (BMI) 32.0-32.9, adult 03/07/2020   Body mass index (BMI) 34.0-34.9, adult 03/07/2020   Chronic kidney disease (CKD) stage G3a/A1, moderately decreased glomerular filtration rate (GFR) between 45-59 mL/min/1.73 square meter Thomas albuminuria creatinine ratio less than 30 mg/g (HCC) 03/07/2020   Chronic sinusitis 03/07/2020   Constipation 03/07/2020   Diabetic renal disease (HCC) 03/07/2020   Hardening of the aorta (main artery  of the heart) (HCC) 03/07/2020   Hyperlipidemia 03/07/2020   Microscopic hematuria 03/07/2020   Nausea 03/07/2020   Other abnormalities of gait Thomas mobility 03/07/2020   History of colonic polyps 03/07/2020   Pneumonia due to SARS-associated coronavirus 03/07/2020   Primary open-angle glaucoma, right eye, mild stage 03/07/2020   Degeneration of lumbar intervertebral disc 03/07/2020   Radiculopathy due to lumbar intervertebral disc disorder 03/07/2020   Right lower quadrant pain 03/07/2020   S/P TURP (status post transurethral resection of prostate) 03/07/2020   Secondary hyperparathyroidism of renal origin (HCC) 03/07/2020   Senile purpura (HCC) 03/07/2020   Sequelae of other specified infectious Thomas parasitic diseases 03/07/2020   Stricture of artery (HCC) 03/07/2020   Subclinical hypothyroidism 03/07/2020   History of COVID-19 04/11/2019   Shortness of breath 04/11/2019   Irregular heart rhythm 04/11/2019   Fatigue 04/11/2019   Generalized weakness 04/11/2019   Acute respiratory failure with hypoxia (HCC) 02/02/2019   Essential hypertension 02/02/2019   Type 2 diabetes mellitus with complication, without long-term current use of insulin  (HCC) 02/02/2019   Hypokalemia 02/02/2019   BPH (benign prostatic hyperplasia) 12/26/2014    PCP: Olin Bertin, MD  REFERRING PROVIDER: Harrold Lincoln, *  THERAPY DIAG:  Unsteadiness on feet  Muscle weakness (generalized)  REFERRING DIAG: Balance  Rationale for Evaluation Thomas Treatment:  Rehabilitation  SUBJECTIVE:  PERTINENT PAST HISTORY:  DM, Afib, SOB, renal disease, lumbar radiculopathy, Hx of DVT, cardiac amyloidosis       PRECAUTIONS: None  WEIGHT BEARING RESTRICTIONS No  FALLS:  Has patient fallen in last 6 months? No, Number of falls: 0  MOI/History of condition:  Onset date: chronic, worsening over 2-3 months  SUBJECTIVE STATEMENT  05/20/2023: Pt reports that he continues to see improvements. He does have  difficulty walking after sitting prolonged.   EVAL:  Thomas Joseph is a 89 y.o. male who presents to clinic with chief complaint of general deconditioning, loss of balance, Thomas light headedness.  He denies any LOC, visual changes, or sensation of spinning.  Pt states that his BP was normal when he was checked for orthostatic hypotension.  He does feel like if he sits for a long period the dizziness is worse (2 hrs is much worse than 30 min)  He states that the light headedness is mostly when he stands up after sitting for a long period Thomas then abates within a few minutes.  He feels generally weak.  He states that he walks regularly when he goes to they [facility].      Pain:  Are you having pain? No  Occupation: retired  Education administrator: [employer]  Hand Dominance: na  Patient Goals/Specific Activities: be able to work around [employer].   OBJECTIVE:   GENERAL OBSERVATION/GAIT: Slow shuffling gait  VITALS   BP: 165/65 sitting, 167/80 standing   HR: 55 sitting, 58 standing  LE MMT:  MMT Right (Eval) Left (Eval)  Hip flexion (L2,  L3) 4 4  Knee extension (L3) 4 4+  Knee flexion 4+ 4+  Hip abduction    Hip extension    Hip external rotation    Hip internal rotation    Hip adduction    Ankle dorsiflexion (L4) 3+ 5  Ankle plantarflexion (S1)    Ankle inversion    Ankle eversion    Great Toe ext (L5)    Grossly     (Blank rows = Thomas tested, score listed is out of 5 possible points.  N = WNL, D = diminished, C = clear for gross weakness with myotome testing, * = concordant pain with testing)  LE ROM:  ROM Right (Eval) Left (Eval) Left 05/20/23 Right 05/20/23  Hip flexion      Hip extension      Hip abduction      Hip adduction      Hip internal rotation      Hip external rotation      Knee extension      Knee flexion      Ankle dorsiflexion   1 -5  Ankle plantarflexion      Ankle inversion      Ankle eversion       (Blank rows = Thomas tested, N = WNL, * = concordant  pain with testing)  Functional Tests  Eval    30'' STS: 6x  UE used? Y    10 m max gait speed: 21'', .48 m/s, AD: SPC    Progressive balance screen (highest level completed for >/= 10''):  Feet together: 10'' Semi Tandem: R in rear 10'', L in rear 10'' unstable Tandem: R in rear unable, L in rear unable    2 MWT: 177'                                               PATIENT SURVEYS:  Patient Specific Functional Scale:  Activity Eval         Getting out of a chair Thomas feeling stable 3         Walking without feeling like he may fall 4                             Average 3.5          (Activities rated 0-10/10.  "10" represents "able to perform at prior level" while "0" represents "unable to perform." )   TODAY'S TREATMENT   Therapeutic Exercise: Creating, reviewing, Thomas completing below HEP  [facility] Adult PT Treatment  05/20/2023:  Therapeutic Exercise:  nu-step L8 6m while taking subjective Thomas planning session with patient Heel / toe raises in parallel bars, limited DF motion available   Therapeutic Activity  sit to stand x 10 from elevated seat, no UE step up 8'' x 10 each with 1 UE Retro ambulation in // bars with Thomas without UE  Side stepping in // bars without UE   Neuromuscular re-ed: hurdle walking ( needs light touch for step over pattern, no UE for step to pattern)  Narrow stance on foam Alternating cross body reaches with trunk rotations Narrow stance on Foam with Eyes closed 5-10 sec x 5  Narrow stance on faom: A/P weight shifting Tandem stance-level surface trials , 5 sec  cone taps alternating with 1 UE, progressing to no  UE    HOME EXERCISE PROGRAM: Access Code: [email or web address] URL: [email or web address] Date: 04/19/2023 Prepared by: Lesleigh Rash  Exercises - Standing Hip Abduction with Counter Support  - 1 x daily - 7 x weekly - 3 sets - 10 reps - Heel Raises with Counter Support  - 1 x daily - 7 x weekly - 3 sets - 10  reps - Standing March with Counter Support  - 1 x daily - 7 x weekly - 3 sets - 10 reps - Heel Toe Raises with Counter Support  - 1 x daily - 7 x weekly - 3 sets - 10 reps - Gastroc Stretch on Wall  - 1 x daily - 7 x weekly - 1 sets - 3 reps - 45 seconds hold - Sit to Stand with Counter Support  - 1 x daily - 7 x weekly - 2-3 sets - 5-10 reps 05/20/23 - Long Sitting Calf Stretch with Strap  - 1 x daily - 7 x weekly - 1 sets - 3 reps - 30 hold  Treatment priorities   Eval 4/8 4/16 05/20/23     Check DF ROM DF ROM '' LT 1 RT -5     General functional strengthening balance ''  Specifically retro      balance Step height ''                       ASSESSMENT:  CLINICAL IMPRESSION:  05/20/2023: Porfirio Bristol tolerated session well with no adverse reaction.  Pt remained in closed chain for most of session except several brief seated rest breaks. Continued with functional balance Thomas endurance with CGA.  On unlevel surface Thomas in narrow stance, he can tolerate min dynamic movement without LOB. Continues to have decreased step length Thomas heel strike. DF measures indicate tight calves. He was given seated Calf stretch to begin at home.    EVAL:  Lamario is a 89 y.o. male who presents to clinic with signs Thomas sxs consistent with instability in gait Thomas weakness.  Pt presents with shuffling gait which worsens with fatigue.  He feels his legs are heavy after walking <1 min.  His dizziness sound most consistent with orthostatic hypotension although he had no significant drop when moving from sitting to standing in clinic today.  He states that it is worse after sitting for >1 hour.  We talked about the need to stay aware of this Thomas avoid walking until the feeling as abated.  Leeon will benefit from skilled PT to address relevant deficits Thomas improve safety Thomas independence in ambulation  OBJECTIVE IMPAIRMENTS: LE Thomas hip strength, gait, balance  ACTIVITY LIMITATIONS: standing, walking, transfers, housework,  yardwork  PERSONAL FACTORS: See medical history Thomas pertinent history   REHAB POTENTIAL: Good  CLINICAL DECISION MAKING: Evolving/moderate complexity  EVALUATION COMPLEXITY: Moderate   GOALS:   SHORT TERM GOALS: Target date: 05/11/2023   Crit will be >75% HEP compliant to improve carryover between sessions Thomas facilitate independent management of condition  Evaluation: ongoing Goal status: MET   LONG TERM GOALS: Target date: 06/08/2023    Mattew will report confidence in maintenance of balance at time of discharge with advanced HEP  Evaluation/Baseline: unable to self manage Goal status: INITIAL   2.  Dyllen will be able to stand for >15'' in tandem stance, to show a significant improvement in balance in order to reduce fall risk   Evaluation/Baseline: unable Goal status: INITIAL   3.  Osmel will improve 30'' STS (  MCID 2) to >/= 8x (w/ UE?: Y) to show improved LE strength Thomas improved transfers   Evaluation/Baseline: 6x  w/ UE? Y Goal status: INITIAL   4.  Davaughn will improve 10 meter max gait speed to .6 m/s (.1 m/s MCID) to show functional improvement in ambulation   Evaluation/Baseline: .48 m/s Goal status: INITIAL   Norms:     5.  Charlemagne will improve two minute walk test to 240 feet (MCID 40 ft).  Evaluation/Baseline: 177 ft Goal status: INITIAL    PLAN: PT FREQUENCY: 1-2x/week  PT DURATION: 8 weeks  PLANNED INTERVENTIONS: Therapeutic exercises, Aquatic therapy, Therapeutic activity, Neuro Muscular re-education, Gait training, Patient/Family education, Joint mobilization, Dry Needling, Electrical stimulation, Spinal mobilization Thomas/or manipulation, Moist heat, Taping, Vasopneumatic device, Ionotophoresis 4mg /ml Dexamethasone , Thomas Manual therapy   Gasper Karst, PTA 05/20/23 12:36 PM Phone: [phone] Fax: [phone]   Date of referral: 3/20 Referring provider: O'Neil Referring diagnosis? imbalance Treatment diagnosis? (if  different than referring diagnosis) unsteadiness on feet R26.81  What was this (referring dx) caused by? Ongoing Issue  Lonne Roan of Condition: Chronic (continuous duration > 3 months)   Laterality: Both  Current Functional Measure Score: Other PSFS: 3.5  Objective measurements identify impairments when they are compared to normal values, the uninvolved extremity, Thomas prior level of function.  [x]  Yes  []  No  Objective assessment of functional ability: Severe functional limitations   Briefly describe symptoms: unsteadiness on feet, difficulty with transfers, poor strength Thomas endurance  How did symptoms Joseph: slow onset  Average pain intensity:  Last 24 hours: 0  Past week: 0  How often does the pt experience symptoms? Constantly  How much have the symptoms interfered with usual daily activities? Quite a bit  How has condition changed since care began at this facility? NA - initial visit  In general, how is the patients overall health? Fair   BACK PAIN (Joseph Back Screening Tool) No

## 2023-05-25 ENCOUNTER — Encounter: Payer: Self-pay | Admitting: Physical Therapy

## 2023-05-25 ENCOUNTER — Ambulatory Visit: Admitting: Physical Therapy

## 2023-05-25 DIAGNOSIS — R262 Difficulty in walking, not elsewhere classified: Secondary | ICD-10-CM | POA: Diagnosis not present

## 2023-05-25 DIAGNOSIS — M6281 Muscle weakness (generalized): Secondary | ICD-10-CM

## 2023-05-25 DIAGNOSIS — R2689 Other abnormalities of gait and mobility: Secondary | ICD-10-CM | POA: Diagnosis not present

## 2023-05-25 DIAGNOSIS — M5459 Other low back pain: Secondary | ICD-10-CM | POA: Diagnosis not present

## 2023-05-25 DIAGNOSIS — R2681 Unsteadiness on feet: Secondary | ICD-10-CM

## 2023-05-25 NOTE — Therapy (Signed)
 OUTPATIENT PHYSICAL THERAPY DAILY NOTE  Patient Name: Thomas Joseph MRN: [id number] DOB:March 03, 1933, 89 y.o., male Today's Date: 05/25/2023   PT End of Session - 05/25/23 1014     Visit Number 9    Number of Visits 12    Date for PT Re-Evaluation 06/08/23    Authorization Type [employer] MCR - PSFS    Progress Note Due on Visit 10    PT Start Time 1015    PT Stop Time 1056    PT Time Calculation (min) 41 min             Past Medical History:  Diagnosis Date   Anemia    Arrhythmia    BPH (benign prostatic hyperplasia)    Chronic kidney disease (CKD) stage G3a/A1, moderately decreased glomerular filtration rate (GFR) between 45-59 mL/min/1.73 square meter and albuminuria creatinine ratio less than 30 mg/g (HCC) 03/07/2020   Diabetes mellitus without complication (HCC)    Dyspnea    ED (erectile dysfunction)    Family history of adverse reaction to anesthesia    son had some problem years ago, pt unsure of what exact problem was   Hypertension    Obesity    Pneumonia    Past Surgical History:  Procedure Laterality Date    LEFT KNEE ARTHROCOPY     COLONOSCOPY WITH PROPOFOL  Left 05/07/2020   Procedure: COLONOSCOPY WITH PROPOFOL ;  Surgeon: Thomas Hopper, MD;  Location: [facility] ENDOSCOPY;  Service: Gastroenterology;  Laterality: Left;   COLONOSCOPY WITH PROPOFOL  N/A 05/11/2020   Procedure: COLONOSCOPY WITH PROPOFOL ;  Surgeon: Thomas Bonds, MD;  Location: [facility] ENDOSCOPY;  Service: Endoscopy;  Laterality: N/A;   ESOPHAGOGASTRODUODENOSCOPY N/A 05/06/2020   Procedure: ESOPHAGOGASTRODUODENOSCOPY (EGD);  Surgeon: Thomas Hilts, MD;  Location: [facility] ENDOSCOPY;  Service: Endoscopy;  Laterality: N/A;   HERNIA REPAIR     IR ANGIOGRAM SELECTIVE EACH ADDITIONAL VESSEL  05/11/2020   IR ANGIOGRAM VISCERAL SELECTIVE  05/11/2020   IR EMBO ART  VEN HEMORR LYMPH EXTRAV  INC GUIDE ROADMAPPING  05/11/2020   IR IVC FILTER PLMT / S&I /IMG GUID/MOD SED  05/11/2020   IR RADIOLOGIST EVAL & MGMT   08/27/2020   IR RADIOLOGIST EVAL & MGMT  02/26/2021   IR US  GUIDE VASC ACCESS RIGHT  05/11/2020   IR VENOGRAM RENAL BI  05/11/2020   TRANSURETHRAL RESECTION OF PROSTATE N/A 12/26/2014   Procedure: TRANSURETHRAL RESECTION OF THE PROSTATE;  Surgeon: Thomas Severs, MD;  Location: [facility] ORS;  Service: Urology;  Laterality: N/A;   Patient Active Problem List   Diagnosis Date Noted   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   Pancreatic cyst 11/02/2022   Protein calorie malnutrition (HCC) 11/02/2022   Acute embolism and thrombosis of unspecified deep veins of unspecified lower extremity (HCC) 05/31/2021   Shoulder joint pain 05/31/2021   Abnormal gait 02/24/2021   Acute deep vein thrombosis (DVT) of calf muscle vein of left lower extremity (HCC) 02/24/2021   Acute pulmonary embolism (HCC) 02/24/2021   Hemorrhage of colon due to diverticulosis 02/24/2021   History of DVT (deep vein thrombosis) 02/24/2021   Malnutrition of mild degree Thomas Joseph: 75% to less than 90% of standard weight) (HCC) 02/24/2021   Personal history of pulmonary embolism 02/24/2021   Polymyalgia rheumatica (HCC) 02/24/2021   Presence of other vascular implants and grafts 02/24/2021   Urinary hesitancy 02/24/2021   Vitamin B deficiency 02/24/2021   Hypomagnesemia 05/11/2020   Atrial fibrillation with RVR (HCC) 05/11/2020   GI bleed 05/05/2020   AKI (  acute kidney injury) (HCC)    2019 novel coronavirus disease (COVID-19) 03/07/2020   Anemia 03/07/2020   Arthropathy 03/07/2020   Body mass index (BMI) 32.0-32.9, adult 03/07/2020   Body mass index (BMI) 34.0-34.9, adult 03/07/2020   Chronic kidney disease (CKD) stage G3a/A1, moderately decreased glomerular filtration rate (GFR) between 45-59 mL/min/1.73 square meter and albuminuria creatinine ratio less than 30 mg/g (HCC) 03/07/2020   Chronic sinusitis 03/07/2020   Constipation 03/07/2020   Diabetic renal disease (HCC) 03/07/2020   Hardening of the aorta (main artery  of the heart) (HCC) 03/07/2020   Hyperlipidemia 03/07/2020   Microscopic hematuria 03/07/2020   Nausea 03/07/2020   Other abnormalities of gait and mobility 03/07/2020   History of colonic polyps 03/07/2020   Pneumonia due to SARS-associated coronavirus 03/07/2020   Primary open-angle glaucoma, right eye, mild stage 03/07/2020   Degeneration of lumbar intervertebral disc 03/07/2020   Radiculopathy due to lumbar intervertebral disc disorder 03/07/2020   Right lower quadrant pain 03/07/2020   S/P TURP (status post transurethral resection of prostate) 03/07/2020   Secondary hyperparathyroidism of renal origin (HCC) 03/07/2020   Senile purpura (HCC) 03/07/2020   Sequelae of other specified infectious and parasitic diseases 03/07/2020   Stricture of artery (HCC) 03/07/2020   Subclinical hypothyroidism 03/07/2020   History of COVID-19 04/11/2019   Shortness of breath 04/11/2019   Irregular heart rhythm 04/11/2019   Fatigue 04/11/2019   Generalized weakness 04/11/2019   Acute respiratory failure with hypoxia (HCC) 02/02/2019   Essential hypertension 02/02/2019   Type 2 diabetes mellitus with complication, without long-term current use of insulin  (HCC) 02/02/2019   Hypokalemia 02/02/2019   BPH (benign prostatic hyperplasia) 12/26/2014    PCP: Thomas Bertin, MD  REFERRING PROVIDER: Harrold Joseph, *  THERAPY DIAG:  Unsteadiness on feet  Muscle weakness (generalized)  Other low back pain  Other abnormalities of gait and mobility  REFERRING DIAG: Balance  Rationale for Evaluation and Treatment:  Rehabilitation  SUBJECTIVE:  PERTINENT PAST HISTORY:  DM, Afib, SOB, renal disease, lumbar radiculopathy, Hx of DVT, cardiac amyloidosis       PRECAUTIONS: None  WEIGHT BEARING RESTRICTIONS No  FALLS:  Has patient fallen in last 6 months? No, Number of falls: 0  MOI/History of condition:  Onset date: chronic, worsening over 2-3 months  SUBJECTIVE  STATEMENT  05/25/2023: Pt reports that he continues feel off balance after sitting for long periods and then standing.   EVAL:  Thomas Joseph is a 89 y.o. male who presents to clinic with chief complaint of general deconditioning, loss of balance, and light headedness.  He denies any LOC, visual changes, or sensation of spinning.  Pt states that his BP was normal when he was checked for orthostatic hypotension.  He does feel like if he sits for a long period the dizziness is worse (2 hrs is much worse than 30 min)  He states that the light headedness is mostly when he stands up after sitting for a long period and then abates within a few minutes.  He feels generally weak.  He states that he walks regularly when he goes to they [facility].      Pain:  Are you having pain? No  Occupation: retired  Education administrator: [employer]  Hand Dominance: na  Patient Goals/Specific Activities: be able to work around [employer].   OBJECTIVE:   GENERAL OBSERVATION/GAIT: Slow shuffling gait  VITALS   BP: 165/65 sitting, 167/80 standing   HR: 55 sitting, 58 standing  LE MMT:  MMT Right (Eval) Left (Eval)  Hip flexion (L2, L3) 4 4  Knee extension (L3) 4 4+  Knee flexion 4+ 4+  Hip abduction    Hip extension    Hip external rotation    Hip internal rotation    Hip adduction    Ankle dorsiflexion (L4) 3+ 5  Ankle plantarflexion (S1)    Ankle inversion    Ankle eversion    Great Toe ext (L5)    Grossly     (Blank rows = not tested, score listed is out of 5 possible points.  N = WNL, D = diminished, C = clear for gross weakness with myotome testing, * = concordant pain with testing)  LE ROM:  ROM Right (Eval) Left (Eval) Left 05/20/23 Right 05/20/23  Hip flexion      Hip extension      Hip abduction      Hip adduction      Hip internal rotation      Hip external rotation      Knee extension      Knee flexion      Ankle dorsiflexion   1 -5  Ankle plantarflexion      Ankle inversion       Ankle eversion       (Blank rows = not tested, N = WNL, * = concordant pain with testing)  Functional Tests  Eval    30'' STS: 6x  UE used? Y    10 m max gait speed: 21'', .48 m/s, AD: SPC    Progressive balance screen (highest level completed for >/= 10''):  Feet together: 10'' Semi Tandem: R in rear 10'', L in rear 10'' unstable Tandem: R in rear unable, L in rear unable    2 MWT: 177'                                               PATIENT SURVEYS:  Patient Specific Functional Scale:  Activity Eval         Getting out of a chair and feeling stable 3         Walking without feeling like he may fall 4                             Average 3.5          (Activities rated 0-10/10.  "10" represents "able to perform at prior level" while "0" represents "unable to perform." )   TODAY'S TREATMENT   Therapeutic Exercise: Creating, reviewing, and completing below HEP  [facility] Adult PT Treatment  05/25/2023:  Therapeutic Exercise:  nu-step L8 89m while taking subjective and planning session with patient Slant board stretch  Therapeutic Activity  sit to stand 2x10 - slight UE support step up 8'' x10 Fwd and lat walking in // without UE support - cone step overs  Neuromuscular re-ed: hurdle walking (needs light touch for step over pattern, no UE for step to pattern)  Working on large step reactive balance bkwd with sudden release     HOME EXERCISE PROGRAM: Access Code: [email or web address] URL: [email or web address] Date: 04/19/2023 Prepared by: Lesleigh Rash  Exercises - Standing Hip Abduction with Counter Support  - 1 x daily - 7 x weekly - 3 sets - 10 reps - Heel Raises with Counter  Support  - 1 x daily - 7 x weekly - 3 sets - 10 reps - Standing March with Counter Support  - 1 x daily - 7 x weekly - 3 sets - 10 reps - Heel Toe Raises with Counter Support  - 1 x daily - 7 x weekly - 3 sets - 10 reps - Gastroc Stretch on Wall  - 1 x daily - 7 x weekly -  1 sets - 3 reps - 45 seconds hold - Sit to Stand with Counter Support  - 1 x daily - 7 x weekly - 2-3 sets - 5-10 reps 05/20/23 - Long Sitting Calf Stretch with Strap  - 1 x daily - 7 x weekly - 1 sets - 3 reps - 30 hold  Treatment priorities   Eval 4/8 4/16 05/20/23     Check DF ROM DF ROM '' LT 1 RT -5     General functional strengthening balance ''  Specifically retro      balance Step height ''                       ASSESSMENT:  CLINICAL IMPRESSION:  05/25/2023: Porfirio Bristol tolerated session well with no adverse reaction.  Continue to work on stability in gait.  Mr. Tsuda shows improved step length and height with min cuing.  We worked on step strategy with backward stepping and this is still significantly challenging for him.  He tends to take small step and lean backward rather than having an anterior w/s which continues his backward momentum.  We worked on taking a larger step backward with concurrent fwd lean which was helpful but pt defaults to smaller step without cuing.  Will continue to work on this at next visit.   EVAL:  Pierson is a 89 y.o. male who presents to clinic with signs and sxs consistent with instability in gait and weakness.  Pt presents with shuffling gait which worsens with fatigue.  He feels his legs are heavy after walking <1 min.  His dizziness sound most consistent with orthostatic hypotension although he had no significant drop when moving from sitting to standing in clinic today.  He states that it is worse after sitting for >1 hour.  We talked about the need to stay aware of this and avoid walking until the feeling as abated.  Kito will benefit from skilled PT to address relevant deficits and improve safety and independence in ambulation  OBJECTIVE IMPAIRMENTS: LE and hip strength, gait, balance  ACTIVITY LIMITATIONS: standing, walking, transfers, housework, yardwork  PERSONAL FACTORS: See medical history and pertinent history   REHAB POTENTIAL:  Good  CLINICAL DECISION MAKING: Evolving/moderate complexity  EVALUATION COMPLEXITY: Moderate   GOALS:   SHORT TERM GOALS: Target date: 05/11/2023   Kyi will be >75% HEP compliant to improve carryover between sessions and facilitate independent management of condition  Evaluation: ongoing Goal status: MET   LONG TERM GOALS: Target date: 06/08/2023    Akash will report confidence in maintenance of balance at time of discharge with advanced HEP  Evaluation/Baseline: unable to self manage Goal status: INITIAL   2.  Jereme will be able to stand for >15'' in tandem stance, to show a significant improvement in balance in order to reduce fall risk   Evaluation/Baseline: unable Goal status: INITIAL   3.  Trine will improve 30'' STS (MCID 2) to >/= 8x (w/ UE?: Y) to show improved LE strength and improved transfers   Evaluation/Baseline: 6x  w/ UE? Y Goal status: INITIAL   4.  Jerard will improve 10 meter max gait speed to .6 m/s (.1 m/s MCID) to show functional improvement in ambulation   Evaluation/Baseline: .48 m/s Goal status: INITIAL   Norms:     5.  Viggo will improve two minute walk test to 240 feet (MCID 40 ft).  Evaluation/Baseline: 177 ft Goal status: INITIAL    PLAN: PT FREQUENCY: 1-2x/week  PT DURATION: 8 weeks  PLANNED INTERVENTIONS: Therapeutic exercises, Aquatic therapy, Therapeutic activity, Neuro Muscular re-education, Gait training, Patient/Family education, Joint mobilization, Dry Needling, Electrical stimulation, Spinal mobilization and/or manipulation, Moist heat, Taping, Vasopneumatic device, Ionotophoresis 4mg /ml Dexamethasone , and Manual therapy   Gasper Karst, PTA 05/25/23 11:06 AM Phone: [phone] Fax: [phone]   Date of referral: 3/20 Referring provider: O'Neil Referring diagnosis? imbalance Treatment diagnosis? (if different than referring diagnosis) unsteadiness on feet R26.81  What was this (referring dx)  caused by? Ongoing Issue  Lonne Roan of Condition: Chronic (continuous duration > 3 months)   Laterality: Both  Current Functional Measure Score: Other PSFS: 3.5  Objective measurements identify impairments when they are compared to normal values, the uninvolved extremity, and prior level of function.  [x]  Yes  []  No  Objective assessment of functional ability: Severe functional limitations   Briefly describe symptoms: unsteadiness on feet, difficulty with transfers, poor strength and endurance  How did symptoms start: slow onset  Average pain intensity:  Last 24 hours: 0  Past week: 0  How often does the pt experience symptoms? Constantly  How much have the symptoms interfered with usual daily activities? Quite a bit  How has condition changed since care began at this facility? NA - initial visit  In general, how is the patients overall health? Fair   BACK PAIN (STarT Back Screening Tool) No

## 2023-05-26 ENCOUNTER — Ambulatory Visit: Admitting: Physical Therapy

## 2023-05-26 ENCOUNTER — Encounter: Payer: Self-pay | Admitting: Physical Therapy

## 2023-05-26 DIAGNOSIS — R2689 Other abnormalities of gait and mobility: Secondary | ICD-10-CM | POA: Diagnosis not present

## 2023-05-26 DIAGNOSIS — R262 Difficulty in walking, not elsewhere classified: Secondary | ICD-10-CM

## 2023-05-26 DIAGNOSIS — R2681 Unsteadiness on feet: Secondary | ICD-10-CM

## 2023-05-26 DIAGNOSIS — M6281 Muscle weakness (generalized): Secondary | ICD-10-CM

## 2023-05-26 DIAGNOSIS — M5459 Other low back pain: Secondary | ICD-10-CM

## 2023-05-26 NOTE — Therapy (Signed)
 PHYSICAL THERAPY DISCHARGE SUMMARY  Visits from Start of Care: 10  Current functional level related to goals / functional outcomes: See assessment/goals   Remaining deficits: See assessment/goals   Education / Equipment: HEP and D/C plans  Patient agrees to discharge. Patient goals were met. Patient is being discharged due to being pleased with the current functional level.  Patient Name: Thomas Joseph MRN: [id number] DOB:[birth year], 89 y.o., male Today's Date: 05/26/2023   PT End of Session - 05/26/23 1148     Visit Number 10    Number of Visits 12    Date for PT Re-Evaluation 06/08/23    Authorization Type [employer] MCR - PSFS    Progress Note Due on Visit 10    PT Start Time 1145    PT Stop Time 1226    PT Time Calculation (min) 41 min             Past Medical History:  Diagnosis Date   Anemia    Arrhythmia    BPH (benign prostatic hyperplasia)    Chronic kidney disease (CKD) stage G3a/A1, moderately decreased glomerular filtration rate (GFR) between 45-59 mL/min/1.73 square meter and albuminuria creatinine ratio less than 30 mg/g (HCC) 03/07/2020   Diabetes mellitus without complication (HCC)    Dyspnea    ED (erectile dysfunction)    Family history of adverse reaction to anesthesia    son had some problem years ago, pt unsure of what exact problem was   Hypertension    Obesity    Pneumonia    Past Surgical History:  Procedure Laterality Date    LEFT KNEE ARTHROCOPY     COLONOSCOPY WITH PROPOFOL  Left 05/07/2020   Procedure: COLONOSCOPY WITH PROPOFOL ;  Surgeon: Felecia Hopper, MD;  Location: [facility] ENDOSCOPY;  Service: Gastroenterology;  Laterality: Left;   COLONOSCOPY WITH PROPOFOL  N/A 05/11/2020   Procedure: COLONOSCOPY WITH PROPOFOL ;  Surgeon: Baldo Bonds, MD;  Location: [facility] ENDOSCOPY;  Service: Endoscopy;  Laterality: N/A;   ESOPHAGOGASTRODUODENOSCOPY N/A 05/06/2020   Procedure: ESOPHAGOGASTRODUODENOSCOPY (EGD);  Surgeon: Evangeline Hilts, MD;   Location: [facility] ENDOSCOPY;  Service: Endoscopy;  Laterality: N/A;   HERNIA REPAIR     IR ANGIOGRAM SELECTIVE EACH ADDITIONAL VESSEL  05/11/2020   IR ANGIOGRAM VISCERAL SELECTIVE  05/11/2020   IR EMBO ART  VEN HEMORR LYMPH EXTRAV  INC GUIDE ROADMAPPING  05/11/2020   IR IVC FILTER PLMT / S&I /IMG GUID/MOD SED  05/11/2020   IR RADIOLOGIST EVAL & MGMT  08/27/2020   IR RADIOLOGIST EVAL & MGMT  02/26/2021   IR US  GUIDE VASC ACCESS RIGHT  05/11/2020   IR VENOGRAM RENAL BI  05/11/2020   TRANSURETHRAL RESECTION OF PROSTATE N/A 12/26/2014   Procedure: TRANSURETHRAL RESECTION OF THE PROSTATE;  Surgeon: Marco Severs, MD;  Location: [facility] ORS;  Service: Urology;  Laterality: N/A;   Patient Active Problem List   Diagnosis Date Noted   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   Pancreatic cyst 11/02/2022   Protein calorie malnutrition (HCC) 11/02/2022   Acute embolism and thrombosis of unspecified deep veins of unspecified lower extremity (HCC) 05/31/2021   Shoulder joint pain 05/31/2021   Abnormal gait 02/24/2021   Acute deep vein thrombosis (DVT) of calf muscle vein of left lower extremity (HCC) 02/24/2021   Acute pulmonary embolism (HCC) 02/24/2021   Hemorrhage of colon due to diverticulosis 02/24/2021   History of DVT (deep vein thrombosis) 02/24/2021   Malnutrition of mild degree [name]: 75% to less than 90% of standard weight) (HCC)  02/24/2021   Personal history of pulmonary embolism 02/24/2021   Polymyalgia rheumatica (HCC) 02/24/2021   Presence of other vascular implants and grafts 02/24/2021   Urinary hesitancy 02/24/2021   Vitamin B deficiency 02/24/2021   Hypomagnesemia 05/11/2020   Atrial fibrillation with RVR (HCC) 05/11/2020   GI bleed 05/05/2020   AKI (acute kidney injury) (HCC)    2019 novel coronavirus disease (COVID-19) 03/07/2020   Anemia 03/07/2020   Arthropathy 03/07/2020   Body mass index (BMI) 32.0-32.9, adult 03/07/2020   Body mass index (BMI) 34.0-34.9, adult  03/07/2020   Chronic kidney disease (CKD) stage G3a/A1, moderately decreased glomerular filtration rate (GFR) between 45-59 mL/min/1.73 square meter and albuminuria creatinine ratio less than 30 mg/g (HCC) 03/07/2020   Chronic sinusitis 03/07/2020   Constipation 03/07/2020   Diabetic renal disease (HCC) 03/07/2020   Hardening of the aorta (main artery of the heart) (HCC) 03/07/2020   Hyperlipidemia 03/07/2020   Microscopic hematuria 03/07/2020   Nausea 03/07/2020   Other abnormalities of gait and mobility 03/07/2020   History of colonic polyps 03/07/2020   Pneumonia due to SARS-associated coronavirus 03/07/2020   Primary open-angle glaucoma, right eye, mild stage 03/07/2020   Degeneration of lumbar intervertebral disc 03/07/2020   Radiculopathy due to lumbar intervertebral disc disorder 03/07/2020   Right lower quadrant pain 03/07/2020   S/P TURP (status post transurethral resection of prostate) 03/07/2020   Secondary hyperparathyroidism of renal origin (HCC) 03/07/2020   Senile purpura (HCC) 03/07/2020   Sequelae of other specified infectious and parasitic diseases 03/07/2020   Stricture of artery (HCC) 03/07/2020   Subclinical hypothyroidism 03/07/2020   History of COVID-19 04/11/2019   Shortness of breath 04/11/2019   Irregular heart rhythm 04/11/2019   Fatigue 04/11/2019   Generalized weakness 04/11/2019   Acute respiratory failure with hypoxia (HCC) 02/02/2019   Essential hypertension 02/02/2019   Type 2 diabetes mellitus with complication, without long-term current use of insulin  (HCC) 02/02/2019   Hypokalemia 02/02/2019   BPH (benign prostatic hyperplasia) 12/26/2014    PCP: Olin Bertin, MD  REFERRING PROVIDER: Harrold Lincoln, *  THERAPY DIAG:  Unsteadiness on feet  Muscle weakness (generalized)  Other low back pain  Other abnormalities of gait and mobility  Difficulty in walking, not elsewhere classified  REFERRING DIAG: Balance  Rationale for  Evaluation and Treatment:  Rehabilitation  SUBJECTIVE:  PERTINENT PAST HISTORY:  DM, Afib, SOB, renal disease, lumbar radiculopathy, Hx of DVT, cardiac amyloidosis       PRECAUTIONS: None  WEIGHT BEARING RESTRICTIONS No  FALLS:  Has patient fallen in last 6 months? No, Number of falls: 0  MOI/History of condition:  Onset date: chronic, worsening over 2-3 months  SUBJECTIVE STATEMENT  05/26/2023: Pt reports that he continues feel off balance after sitting for long periods and then standing.   EVAL:  SKYLIN ROOME is a 89 y.o. male who presents to clinic with chief complaint of general deconditioning, loss of balance, and light headedness.  He denies any LOC, visual changes, or sensation of spinning.  Pt states that his BP was normal when he was checked for orthostatic hypotension.  He does feel like if he sits for a long period the dizziness is worse (2 hrs is much worse than 30 min)  He states that the light headedness is mostly when he stands up after sitting for a long period and then abates within a few minutes.  He feels generally weak.  He states that he walks regularly when he goes to  [facility] [employer].      Pain:  Are you having pain? No  Occupation: retired  Education administrator: [employer]  Hand Dominance: na  Patient Goals/Specific Activities: be able to work around [employer].   OBJECTIVE:   GENERAL OBSERVATION/GAIT: Slow shuffling gait  VITALS   BP: 165/65 sitting, 167/80 standing   HR: 55 sitting, 58 standing  LE MMT:  MMT Right (Eval) Left (Eval)  Hip flexion (L2, L3) 4 4  Knee extension (L3) 4 4+  Knee flexion 4+ 4+  Hip abduction    Hip extension    Hip external rotation    Hip internal rotation    Hip adduction    Ankle dorsiflexion (L4) 3+ 5  Ankle plantarflexion (S1)    Ankle inversion    Ankle eversion    Great Toe ext (L5)    Grossly     (Blank rows = not tested, score listed is out of 5 possible points.  N = WNL, D = diminished, C = clear for  gross weakness with myotome testing, * = concordant pain with testing)  LE ROM:  ROM Right (Eval) Left (Eval) Left 05/20/23 Right 05/20/23  Hip flexion      Hip extension      Hip abduction      Hip adduction      Hip internal rotation      Hip external rotation      Knee extension      Knee flexion      Ankle dorsiflexion   1 -5  Ankle plantarflexion      Ankle inversion      Ankle eversion       (Blank rows = not tested, N = WNL, * = concordant pain with testing)  Functional Tests  Eval    30'' STS: 6x  UE used? Y    10 m max gait speed: 21'', .48 m/s, AD: SPC    Progressive balance screen (highest level completed for >/= 10''):  Feet together: 10'' Semi Tandem: R in rear 10'', L in rear 10'' unstable Tandem: R in rear unable, L in rear unable    2 MWT: 177'                                               PATIENT SURVEYS:  Patient Specific Functional Scale:  Activity Eval         Getting out of a chair and feeling stable 3         Walking without feeling like he may fall 4                             Average 3.5          (Activities rated 0-10/10.  "10" represents "able to perform at prior level" while "0" represents "unable to perform." )   TODAY'S TREATMENT   Therapeutic Exercise: Creating, reviewing, and completing below HEP  [facility] Adult PT Treatment  05/26/2023:  Therapeutic Activity  Checking goals and reviewing d/c plans Updating and reviewing HEP Fwd and lat walking in // without UE support - hurdle step overs  Neuromuscular re-ed: Working on large step reactive balance bkwd with sudden release Retro walking with differing step length     HOME EXERCISE PROGRAM: Access Code: [email or web address] URL: [email or web address] Date: 05/26/2023  Prepared by: Lesleigh Rash  Exercises - Standing Hip Abduction with Counter Support  - 1 x daily - 7 x weekly - 3 sets - 10 reps - Heel Raises with Counter Support  - 1 x daily - 7 x weekly  - 3 sets - 10 reps - Standing March with Counter Support  - 1 x daily - 7 x weekly - 3 sets - 10 reps - Heel Toe Raises with Counter Support  - 1 x daily - 7 x weekly - 3 sets - 10 reps - Gastroc Stretch on Wall  - 1 x daily - 7 x weekly - 1 sets - 3 reps - 45 seconds hold - Sit to Stand Without Arm Support  - 1 x daily - 4-7 x weekly - 3 sets - 10 reps - Standing Tandem Balance with Counter Support  - 1 x daily - 7 x weekly - 3 sets - 45'' hold  Treatment priorities   Eval 4/8 4/16 05/20/23     Check DF ROM DF ROM '' LT 1 RT -5     General functional strengthening balance ''  Specifically retro      balance Step height ''                       ASSESSMENT:  CLINICAL IMPRESSION:  05/26/2023: Porfirio Bristol tolerated session well with no adverse reaction.  Upon goal re-check today he has met all short and long term goals set at evaluation.  He shows significant improvements in balance, LE strength and endurance, global endurance, and gait speed.  He is confident that he can continue working on the exercises at home with HEP.  I encouraged him to always have a solid surface within easy reach when working on HEP and to avoid any exercise which makes him feel excessively unstable; he confirms understanding.  Encouraged getting hiking sticks for use when walking in the yard.   EVAL:  Marvion is a 89 y.o. male who presents to clinic with signs and sxs consistent with instability in gait and weakness.  Pt presents with shuffling gait which worsens with fatigue.  He feels his legs are heavy after walking <1 min.  His dizziness sound most consistent with orthostatic hypotension although he had no significant drop when moving from sitting to standing in clinic today.  He states that it is worse after sitting for >1 hour.  We talked about the need to stay aware of this and avoid walking until the feeling as abated.  Tayden will benefit from skilled PT to address relevant deficits and improve safety and independence  in ambulation  OBJECTIVE IMPAIRMENTS: LE and hip strength, gait, balance  ACTIVITY LIMITATIONS: standing, walking, transfers, housework, yardwork  PERSONAL FACTORS: See medical history and pertinent history   REHAB POTENTIAL: Good  CLINICAL DECISION MAKING: Evolving/moderate complexity  EVALUATION COMPLEXITY: Moderate   GOALS:   SHORT TERM GOALS: Target date: 05/11/2023   Sa will be >75% HEP compliant to improve carryover between sessions and facilitate independent management of condition  Evaluation: ongoing Goal status: MET   LONG TERM GOALS: Target date: 06/08/2023    Stephens will report confidence in maintenance of balance at time of discharge with advanced HEP  Evaluation/Baseline: unable to self manage 5/15: MET Goal status: MET   2.  Danyel will be able to stand for >15'' in tandem stance, to show a significant improvement in balance in order to reduce fall risk   Evaluation/Baseline: unable 5/15:  15'' ea but challenging Goal status: MET   3.  Robby will improve 30'' STS (MCID 2) to >/= 8x (w/ UE?: Y) to show improved LE strength and improved transfers   Evaluation/Baseline: 6x  w/ UE? Y 5/15: 9 w/ UE support Goal status: MET   4.  Paulos will improve 10 meter max gait speed to .6 m/s (.1 m/s MCID) to show functional improvement in ambulation   Evaluation/Baseline: .48 m/s 5/15: .77 m/s Goal status: MET   Norms:     5.  Shaunte will improve two minute walk test to 240 feet (MCID 40 ft).  Evaluation/Baseline: 177 ft 5/15: 258' Goal status: MET    PLAN: PT FREQUENCY: 1-2x/week  PT DURATION: 8 weeks  PLANNED INTERVENTIONS: Therapeutic exercises, Aquatic therapy, Therapeutic activity, Neuro Muscular re-education, Gait training, Patient/Family education, Joint mobilization, Dry Needling, Electrical stimulation, Spinal mobilization and/or manipulation, Moist heat, Taping, Vasopneumatic device, Ionotophoresis 4mg /ml Dexamethasone , and  Manual therapy  Marquis Sitter PT Phone: [phone] Fax: [phone]   Date of referral: 3/20 Referring provider: O'Neil Referring diagnosis? imbalance Treatment diagnosis? (if different than referring diagnosis) unsteadiness on feet R26.81  What was this (referring dx) caused by? Ongoing Issue  Lonne Roan of Condition: Chronic (continuous duration > 3 months)   Laterality: Both  Current Functional Measure Score: Other PSFS: 3.5  Objective measurements identify impairments when they are compared to normal values, the uninvolved extremity, and prior level of function.  [x]  Yes  []  No  Objective assessment of functional ability: Severe functional limitations   Briefly describe symptoms: unsteadiness on feet, difficulty with transfers, poor strength and endurance  How did symptoms start: slow onset  Average pain intensity:  Last 24 hours: 0  Past week: 0  How often does the pt experience symptoms? Constantly  How much have the symptoms interfered with usual daily activities? Quite a bit  How has condition changed since care began at this facility? NA - initial visit  In general, how is the patients overall health? Fair   BACK PAIN (STarT Back Screening Tool) No

## 2023-05-31 ENCOUNTER — Encounter: Payer: Self-pay | Admitting: Podiatry

## 2023-05-31 ENCOUNTER — Ambulatory Visit: Payer: Medicare Other | Admitting: Podiatry

## 2023-05-31 DIAGNOSIS — M79674 Pain in right toe(s): Secondary | ICD-10-CM

## 2023-05-31 DIAGNOSIS — M79675 Pain in left toe(s): Secondary | ICD-10-CM | POA: Diagnosis not present

## 2023-05-31 DIAGNOSIS — B351 Tinea unguium: Secondary | ICD-10-CM | POA: Diagnosis not present

## 2023-05-31 DIAGNOSIS — E1151 Type 2 diabetes mellitus with diabetic peripheral angiopathy without gangrene: Secondary | ICD-10-CM | POA: Diagnosis not present

## 2023-05-31 DIAGNOSIS — L84 Corns and callosities: Secondary | ICD-10-CM | POA: Diagnosis not present

## 2023-05-31 NOTE — Progress Notes (Unsigned)
  Subjective:  Patient ID: Thomas Joseph, male    DOB: April 15, 1933,  MRN: [id number]  Thomas Joseph presents to clinic today for {jgcomplaint:23593}  Chief Complaint  Patient presents with   Routine foot care    Rm16/ diabetic/NIDDM/A1C 5/Bloodsugar 90/ PCP Dr. Alexia Angelucci next visit June 5    New problem(s): None. {jgcomplaint:23593}  PCP is Olin Bertin, MD.  Allergies  Allergen Reactions   Atorvastatin Other (See Comments)    Muscle pain  Other Reaction(s): shoulder/arm pain   Hydrochlorothiazide  Other (See Comments)    Reaction not recalled, believes it just made him urinate very frequently  Other Reaction(s): ed   Metoprolol     Other reaction(s): high BP, urinary symptoms  Other Reaction(s): high BP, urinary symptoms   Tizanidine Hcl Other (See Comments)    Other Reaction(s): groggy, sleepy, hallucinations   Sildenafil Palpitations    Other reaction(s): TACHYCARDIA  Other Reaction(s): TACHYCARDIA   Viagra [Sildenafil Citrate] Palpitations    Review of Systems: Negative except as noted in the HPI.  Objective: No changes noted in today's physical examination. There were no vitals filed for this visit. Thomas Joseph is a pleasant 89 y.o. male {jgbodyhabitus:24098} AAO x 3.  Vascular Examination: CFT <3 seconds b/l. DP pulses faintly palpable b/l. PT pulses nonpalpable b/l. Digital hair absent. Skin temperature gradient warm to warm b/l. No pain with calf compression. No ischemia or gangrene. No cyanosis or clubbing noted b/l. {jgvascular:23595}   Neurological Examination: Sensation grossly intact b/l with 10 gram monofilament. Vibratory sensation intact b/l. {jgneuro:23601::"Protective sensation intact 5/5 intact bilaterally with 10g monofilament b/l.","Vibratory sensation intact b/l.","Proprioception intact bilaterally."}  Dermatological Examination: Pedal skin warm and supple b/l. No open wounds b/l. No interdigital macerations. Toenails 1-5 b/l thick,  discolored, elongated with subungual debris and pain on dorsal palpation.  {jgderm:23598}  Musculoskeletal Examination: {jgmsk:23600}  Radiographs: None  Xrays {jgPodToeLocator:23637}: {jgxrayfindings:23683}  Last HgA1c:       No data to display          Assessment/Plan: No diagnosis found.  No orders of the defined types were placed in this encounter.   None {Jgplan:23602::"-Patient/POA to call should there be question/concern in the interim."}   Return in about 3 months (around 08/31/2023).  Luella Sager, DPM      [place] LOCATION: [address], [place] [address]                   Office [phone]   [facility] LOCATION: [address] [place], [place] [address] Office [phone]

## 2023-06-10 ENCOUNTER — Other Ambulatory Visit: Payer: Self-pay

## 2023-06-13 ENCOUNTER — Other Ambulatory Visit: Payer: Self-pay

## 2023-06-13 NOTE — Progress Notes (Signed)
 Specialty Pharmacy Refill Coordination Note  Thomas Joseph is a 89 y.o. male contacted today regarding refills of specialty medication(s) Acoramidis HCl Lennon Race)   Patient requested Delivery   Delivery date: 06/20/23   Verified address: [address]  [place] [id number]   Medication will be filled on 06/17/23.

## 2023-06-16 DIAGNOSIS — E1122 Type 2 diabetes mellitus with diabetic chronic kidney disease: Secondary | ICD-10-CM | POA: Diagnosis not present

## 2023-06-16 DIAGNOSIS — E854 Organ-limited amyloidosis: Secondary | ICD-10-CM | POA: Diagnosis not present

## 2023-06-16 DIAGNOSIS — N1831 Chronic kidney disease, stage 3a: Secondary | ICD-10-CM | POA: Diagnosis not present

## 2023-06-16 DIAGNOSIS — I480 Paroxysmal atrial fibrillation: Secondary | ICD-10-CM | POA: Diagnosis not present

## 2023-06-16 DIAGNOSIS — I43 Cardiomyopathy in diseases classified elsewhere: Secondary | ICD-10-CM | POA: Diagnosis not present

## 2023-06-16 DIAGNOSIS — D6869 Other thrombophilia: Secondary | ICD-10-CM | POA: Diagnosis not present

## 2023-06-16 DIAGNOSIS — N2581 Secondary hyperparathyroidism of renal origin: Secondary | ICD-10-CM | POA: Diagnosis not present

## 2023-06-17 ENCOUNTER — Other Ambulatory Visit: Payer: Self-pay

## 2023-07-11 DIAGNOSIS — E1122 Type 2 diabetes mellitus with diabetic chronic kidney disease: Secondary | ICD-10-CM | POA: Diagnosis not present

## 2023-07-11 DIAGNOSIS — I480 Paroxysmal atrial fibrillation: Secondary | ICD-10-CM | POA: Diagnosis not present

## 2023-07-11 DIAGNOSIS — I10 Essential (primary) hypertension: Secondary | ICD-10-CM | POA: Diagnosis not present

## 2023-07-13 ENCOUNTER — Other Ambulatory Visit (HOSPITAL_COMMUNITY): Payer: Self-pay

## 2023-07-14 ENCOUNTER — Other Ambulatory Visit (HOSPITAL_COMMUNITY): Payer: Self-pay

## 2023-07-18 ENCOUNTER — Other Ambulatory Visit: Payer: Self-pay

## 2023-07-20 ENCOUNTER — Other Ambulatory Visit: Payer: Self-pay

## 2023-07-20 NOTE — Progress Notes (Signed)
 Specialty Pharmacy Refill Coordination Note  Thomas Joseph is a 89 y.o. male contacted today regarding refills of specialty medication(s) Acoramidis  HCl ([place] )   Patient requested Delivery   Delivery date: 07/27/23   Verified address: [address]  [place] [id number]   Medication will be filled on 07/26/23.

## 2023-07-25 ENCOUNTER — Other Ambulatory Visit: Payer: Self-pay

## 2023-07-27 ENCOUNTER — Other Ambulatory Visit: Payer: Self-pay | Admitting: Hematology and Oncology

## 2023-07-27 ENCOUNTER — Inpatient Hospital Stay: Payer: Medicare Other | Attending: Hematology and Oncology

## 2023-07-27 DIAGNOSIS — Z806 Family history of leukemia: Secondary | ICD-10-CM | POA: Diagnosis not present

## 2023-07-27 DIAGNOSIS — Z79899 Other long term (current) drug therapy: Secondary | ICD-10-CM | POA: Insufficient documentation

## 2023-07-27 DIAGNOSIS — D472 Monoclonal gammopathy: Secondary | ICD-10-CM | POA: Insufficient documentation

## 2023-07-27 DIAGNOSIS — Z87891 Personal history of nicotine dependence: Secondary | ICD-10-CM | POA: Diagnosis not present

## 2023-07-27 DIAGNOSIS — K5730 Diverticulosis of large intestine without perforation or abscess without bleeding: Secondary | ICD-10-CM | POA: Insufficient documentation

## 2023-07-27 DIAGNOSIS — Z8349 Family history of other endocrine, nutritional and metabolic diseases: Secondary | ICD-10-CM | POA: Diagnosis not present

## 2023-07-27 DIAGNOSIS — Z809 Family history of malignant neoplasm, unspecified: Secondary | ICD-10-CM | POA: Insufficient documentation

## 2023-07-27 DIAGNOSIS — K862 Cyst of pancreas: Secondary | ICD-10-CM | POA: Diagnosis not present

## 2023-07-27 DIAGNOSIS — Z8249 Family history of ischemic heart disease and other diseases of the circulatory system: Secondary | ICD-10-CM | POA: Diagnosis not present

## 2023-07-27 DIAGNOSIS — E859 Amyloidosis, unspecified: Secondary | ICD-10-CM

## 2023-07-27 DIAGNOSIS — N400 Enlarged prostate without lower urinary tract symptoms: Secondary | ICD-10-CM | POA: Insufficient documentation

## 2023-07-27 DIAGNOSIS — N1831 Chronic kidney disease, stage 3a: Secondary | ICD-10-CM | POA: Insufficient documentation

## 2023-07-27 DIAGNOSIS — Z823 Family history of stroke: Secondary | ICD-10-CM | POA: Insufficient documentation

## 2023-07-27 DIAGNOSIS — K7689 Other specified diseases of liver: Secondary | ICD-10-CM | POA: Diagnosis not present

## 2023-07-27 LAB — CMP (CANCER CENTER ONLY)
ALT: 11 U/L (ref 0–44)
AST: 20 U/L (ref 15–41)
Albumin: 3.7 g/dL (ref 3.5–5.0)
Alkaline Phosphatase: 70 U/L (ref 38–126)
Anion gap: 6 (ref 5–15)
BUN: 25 mg/dL — ABNORMAL HIGH (ref 8–23)
CO2: 27 mmol/L (ref 22–32)
Calcium: 9.2 mg/dL (ref 8.9–10.3)
Chloride: 106 mmol/L (ref 98–111)
Creatinine: 1.67 mg/dL — ABNORMAL HIGH (ref 0.61–1.24)
GFR, Estimated: 39 mL/min — ABNORMAL LOW (ref >60)
Glucose, Bld: 128 mg/dL — ABNORMAL HIGH (ref 70–99)
Potassium: 4.2 mmol/L (ref 3.5–5.1)
Sodium: 139 mmol/L (ref 135–145)
Total Bilirubin: 0.7 mg/dL (ref 0.0–1.2)
Total Protein: 7.2 g/dL (ref 6.5–8.1)

## 2023-07-27 LAB — CBC WITH DIFFERENTIAL (CANCER CENTER ONLY)
Abs Immature Granulocytes: 0 K/uL (ref 0.00–0.07)
Basophils Absolute: 0 K/uL (ref 0.0–0.1)
Basophils Relative: 1 %
Eosinophils Absolute: 0.3 K/uL (ref 0.0–0.5)
Eosinophils Relative: 8 %
HCT: 37.6 % — ABNORMAL LOW (ref 39.0–52.0)
Hemoglobin: 12.9 g/dL — ABNORMAL LOW (ref 13.0–17.0)
Immature Granulocytes: 0 %
Lymphocytes Relative: 30 %
Lymphs Abs: 1.1 K/uL (ref 0.7–4.0)
MCH: 30.6 pg (ref 26.0–34.0)
MCHC: 34.3 g/dL (ref 30.0–36.0)
MCV: 89.3 fL (ref 80.0–100.0)
Monocytes Absolute: 0.5 K/uL (ref 0.1–1.0)
Monocytes Relative: 13 %
Neutro Abs: 1.7 K/uL (ref 1.7–7.7)
Neutrophils Relative %: 48 %
Platelet Count: 200 K/uL (ref 150–400)
RBC: 4.21 MIL/uL — ABNORMAL LOW (ref 4.22–5.81)
RDW: 13.8 % (ref 11.5–15.5)
WBC Count: 3.5 K/uL — ABNORMAL LOW (ref 4.0–10.5)
nRBC: 0 % (ref 0.0–0.2)

## 2023-07-27 LAB — LACTATE DEHYDROGENASE: LDH: 156 U/L (ref 98–192)

## 2023-07-28 DIAGNOSIS — K862 Cyst of pancreas: Secondary | ICD-10-CM

## 2023-07-28 LAB — KAPPA/LAMBDA LIGHT CHAINS
Kappa free light chain: 46 mg/L — ABNORMAL HIGH (ref 3.3–19.4)
Kappa, lambda light chain ratio: 1.57 (ref 0.26–1.65)
Lambda free light chains: 29.3 mg/L — ABNORMAL HIGH (ref 5.7–26.3)

## 2023-08-01 LAB — MULTIPLE MYELOMA PANEL, SERUM
Albumin SerPl Elph-Mcnc: 3.5 g/dL (ref 2.9–4.4)
Albumin/Glob SerPl: 1.1 (ref 0.7–1.7)
Alpha 1: 0.2 g/dL (ref 0.0–0.4)
Alpha2 Glob SerPl Elph-Mcnc: 0.6 g/dL (ref 0.4–1.0)
B-Globulin SerPl Elph-Mcnc: 1.2 g/dL (ref 0.7–1.3)
Gamma Glob SerPl Elph-Mcnc: 1.4 g/dL (ref 0.4–1.8)
Globulin, Total: 3.3 g/dL (ref 2.2–3.9)
IgA: 372 mg/dL (ref 61–437)
IgG (Immunoglobin G), Serum: 1504 mg/dL (ref 603–1613)
IgM (Immunoglobulin M), Srm: 52 mg/dL (ref 15–143)
M Protein SerPl Elph-Mcnc: 0.6 g/dL — ABNORMAL HIGH
Total Protein ELP: 6.8 g/dL (ref 6.0–8.5)

## 2023-08-02 NOTE — Progress Notes (Signed)
 [facility] [phone]   [phone]  PROGRESS NOTE  Patient Care Team: Verena Mems, MD as PCP - General (Family Medicine)  Hematological/Oncological History # IgG Kappa Monoclonal Gammopathy # Concern for AL Amyloidosis 11/19/2022: As part of cardiology workup patient underwent an IFE of the serum which showed an IgG kappa monoclonal protein. 01/04/2023: establish care with Dr. Federico   Interval History:  Thomas Joseph 89 y.o. male with medical history significant for IgG kappa MGUS who presents for a follow up visit. The patient's last visit was on 01/04/2023. In the interim since the last visit he has had no major changes in his health.  On exam today [name] reports he has been well overall in the interim since her last visit with no ER visits, surgeries, or hospitalizations.  He reports that he is not having any bone pain or back pain.  He reports he is a little bit off balance and unsteady but has not had any falls.  He denies any urinary issues such as bubbling or foaming of the urine.  Reports he is doing his best to try to keep up with hydration.  Overall his energy levels and appetite are stable.  He has had no recent trouble with fevers, chills, sweats, nausea, vomiting or diarrhea.  A full 10 point ROS is otherwise negative.  MEDICAL HISTORY:  Past Medical History:  Diagnosis Date   Anemia    Arrhythmia    BPH (benign prostatic hyperplasia)    Chronic kidney disease (CKD) stage G3a/A1, moderately decreased glomerular filtration rate (GFR) between 45-59 mL/min/1.73 square meter and albuminuria creatinine ratio less than 30 mg/g (HCC) 03/07/2020   Diabetes mellitus without complication (HCC)    Dyspnea    ED (erectile dysfunction)    Family history of adverse reaction to anesthesia    son had some problem years ago, pt unsure of what exact problem was   Hypertension    Obesity    Pneumonia     SURGICAL HISTORY: Past  Surgical History:  Procedure Laterality Date    LEFT KNEE ARTHROCOPY     COLONOSCOPY WITH PROPOFOL  Left 05/07/2020   Procedure: COLONOSCOPY WITH PROPOFOL ;  Surgeon: Elicia Claw, MD;  Location: [facility] ENDOSCOPY;  Service: Gastroenterology;  Laterality: Left;   COLONOSCOPY WITH PROPOFOL  N/A 05/11/2020   Procedure: COLONOSCOPY WITH PROPOFOL ;  Surgeon: Dianna Specking, MD;  Location: [facility] ENDOSCOPY;  Service: Endoscopy;  Laterality: N/A;   ESOPHAGOGASTRODUODENOSCOPY N/A 05/06/2020   Procedure: ESOPHAGOGASTRODUODENOSCOPY (EGD);  Surgeon: Burnette Fallow, MD;  Location: [facility] ENDOSCOPY;  Service: Endoscopy;  Laterality: N/A;   HERNIA REPAIR     IR ANGIOGRAM SELECTIVE EACH ADDITIONAL VESSEL  05/11/2020   IR ANGIOGRAM VISCERAL SELECTIVE  05/11/2020   IR EMBO ART  VEN HEMORR LYMPH EXTRAV  INC GUIDE ROADMAPPING  05/11/2020   IR IVC FILTER PLMT / S&I /IMG GUID/MOD SED  05/11/2020   IR RADIOLOGIST EVAL & MGMT  08/27/2020   IR RADIOLOGIST EVAL & MGMT  02/26/2021   IR US  GUIDE VASC ACCESS RIGHT  05/11/2020   IR VENOGRAM RENAL BI  05/11/2020   TRANSURETHRAL RESECTION OF PROSTATE N/A 12/26/2014   Procedure: TRANSURETHRAL RESECTION OF THE PROSTATE;  Surgeon: Belvie LITTIE Clara, MD;  Location: [facility] ORS;  Service: Urology;  Laterality: N/A;    SOCIAL HISTORY: Social History   Socioeconomic History   Marital status: Married    Spouse name: Not on file   Number of children: 6   Years of education:  Not on file   Highest education level: Not on file  Occupational History   Occupation: Production designer, theatre/television/film  Tobacco Use   Smoking status: Former    Current packs/day: 0.00    Types: Cigarettes    Quit date: 11/28/1984    Years since quitting: 38.7   Smokeless tobacco: Never  Vaping Use   Vaping status: Never Used  Substance and Sexual Activity   Alcohol use: Not Currently    Comment: occasional glass of wine   Drug use: No   Sexual activity: Not on file  Other Topics Concern   Not on file  Social  History Narrative   Not on file   Social Drivers of Health   Financial Resource Strain: Not on file  Food Insecurity: No Food Insecurity (01/04/2023)   Hunger Vital Sign    Worried About Running Out of Food in the Last Year: Never true    Ran Out of Food in the Last Year: Never true  Transportation Needs: No Transportation Needs (01/04/2023)   PRAPARE - Administrator, Civil Service (Medical): No    Lack of Transportation (Non-Medical): No  Physical Activity: Not on file  Stress: Not on file  Social Connections: Not on file  Intimate Partner Violence: Not At Risk (01/04/2023)   Humiliation, Afraid, Rape, and Kick questionnaire    Fear of Current or Ex-Partner: No    Emotionally Abused: No    Physically Abused: No    Sexually Abused: No    FAMILY HISTORY: Family History  Problem Relation Age of Onset   CVA Father    Hypertension Father    Leukemia Sister    Thyroid  disease Sister    Cancer Brother     ALLERGIES:  is allergic to atorvastatin, hydrochlorothiazide , metoprolol, tizanidine hcl, sildenafil, and viagra [sildenafil citrate].  MEDICATIONS:  Current Outpatient Medications  Medication Sig Dispense Refill   acetaminophen  (TYLENOL ) 500 MG tablet Take 500 mg by mouth every 6 (six) hours as needed for moderate pain (pain score 4-6).     Acoramidis , 712MG  Twice Daily, (ATTRUBY ) 356 MG TBPK Take 2 tablets by mouth in the morning and at bedtime. 120 each 12   amLODipine  (NORVASC ) 10 MG tablet Take 10 mg by mouth daily.     Blood Pressure Monitor DEVI      cetirizine (ZYRTEC ALLERGY) 10 MG tablet 1 tablet as needed     docusate sodium  (COLACE) 100 MG capsule 1 capsule     furosemide  (LASIX ) 20 MG tablet Take 1 tablet (20 mg total) by mouth every other day. 90 tablet 3   ketorolac  (ACULAR ) 0.5 % ophthalmic solution Place 1 drop into the right eye 4 (four) times daily.     latanoprost  (XALATAN ) 0.005 % ophthalmic solution 1 drop into both eyes at bedtime      lisinopril  (ZESTRIL ) 40 MG tablet Take 1 tablet (40 mg total) by mouth daily. 90 tablet 3   metFORMIN (GLUCOPHAGE-XR) 500 MG 24 hr tablet Take 2 tablets by mouth daily.     Polyethylene Glycol 3350  (MIRALAX  PO) Take by mouth as needed.     sulfamethoxazole-trimethoprim (BACTRIM DS) 800-160 MG tablet Take 1 tablet by mouth 2 (two) times daily.     vitamin B-12 (CYANOCOBALAMIN ) 1000 MCG tablet 1 tablet     No current facility-administered medications for this visit.    REVIEW OF SYSTEMS:   Constitutional: ( - ) fevers, ( - )  chills , ( - ) night sweats  Eyes: ( - ) blurriness of vision, ( - ) double vision, ( - ) watery eyes Ears, nose, mouth, throat, and face: ( - ) mucositis, ( - ) sore throat Respiratory: ( - ) cough, ( - ) dyspnea, ( - ) wheezes Cardiovascular: ( - ) palpitation, ( - ) chest discomfort, ( - ) lower extremity swelling Gastrointestinal:  ( - ) nausea, ( - ) heartburn, ( - ) change in bowel habits Skin: ( - ) abnormal skin rashes Lymphatics: ( - ) new lymphadenopathy, ( - ) easy bruising Neurological: ( - ) numbness, ( - ) tingling, ( - ) new weaknesses Behavioral/Psych: ( - ) mood change, ( - ) new changes  All other systems were reviewed with the patient and are negative.  PHYSICAL EXAMINATION: Vitals:   08/03/23 1130  BP: (!) 158/60  Pulse: 60  Resp: 13  Temp: 98.3 F (36.8 C)  SpO2: 100%   Filed Weights   08/03/23 1130  Weight: 214 lb 9.6 oz (97.3 kg)    GENERAL: Well-appearing elderly African-American male, alert, no distress and comfortable SKIN: skin color, texture, turgor are normal, no rashes or significant lesions EYES: conjunctiva are pink and non-injected, sclera clear LUNGS: clear to auscultation and percussion with normal breathing effort HEART: regular rate & rhythm and no murmurs and no lower extremity edema Musculoskeletal: no cyanosis of digits and no clubbing  PSYCH: alert & oriented x 3, fluent speech NEURO: no focal motor/sensory  deficits  LABORATORY DATA:  I have reviewed the data as listed    Latest Ref Rng & Units 07/27/2023   10:37 AM 01/20/2023    8:10 AM 01/04/2023   11:00 AM  CBC  WBC 4.0 - 10.5 K/uL 3.5  3.5  4.0   Hemoglobin 13.0 - 17.0 g/dL 87.0  86.9  86.8   Hematocrit 39.0 - 52.0 % 37.6  36.9  38.0   Platelets 150 - 400 K/uL 200  158  203        Latest Ref Rng & Units 07/27/2023   10:37 AM 01/20/2023    8:10 AM 01/04/2023   11:00 AM  CMP  Glucose 70 - 99 mg/dL 871  895  93   BUN 8 - 23 mg/dL 25  25  29    Creatinine 0.61 - 1.24 mg/dL 8.32  8.52  8.58   Sodium 135 - 145 mmol/L 139  141  141   Potassium 3.5 - 5.1 mmol/L 4.2  3.3  4.0   Chloride 98 - 111 mmol/L 106  103  104   CO2 22 - 32 mmol/L 27  28  31    Calcium  8.9 - 10.3 mg/dL 9.2  9.1  9.3   Total Protein 6.5 - 8.1 g/dL 7.2   6.8   Total Bilirubin 0.0 - 1.2 mg/dL 0.7   0.6   Alkaline Phos 38 - 126 U/L 70   61   AST 15 - 41 U/L 20   17   ALT 0 - 44 U/L 11   9     Lab Results  Component Value Date   MPROTEIN 0.6 (H) 07/27/2023   MPROTEIN 0.6 (H) 01/04/2023   Lab Results  Component Value Date   KPAFRELGTCHN 46.0 (H) 07/27/2023   KPAFRELGTCHN 48.3 (H) 01/04/2023   LAMBDASER 29.3 (H) 07/27/2023   LAMBDASER 25.8 01/04/2023   KAPLAMBRATIO 1.57 07/27/2023   KAPLAMBRATIO 11.25 01/13/2023   KAPLAMBRATIO 1.87 (H) 01/04/2023    RADIOGRAPHIC STUDIES: MR ABDOMEN MRCP W  WO CONTAST Result Date: 08/05/2023 CLINICAL DATA:  Pancreatic cyst EXAM: MRI ABDOMEN WITHOUT AND WITH CONTRAST (INCLUDING MRCP) TECHNIQUE: Multiplanar multisequence MR imaging of the abdomen was performed both before and after the administration of intravenous contrast. Heavily T2-weighted images of the biliary and pancreatic ducts were obtained, and three-dimensional MRCP images were rendered by post processing. CONTRAST:  10 mL Vueway  gadolinium contrast IV COMPARISON:  CT abdomen pelvis, 07/31/2022, 05/13/2020, 02/01/2018 FINDINGS: Lower chest: No acute abnormality.  Hepatobiliary: No solid liver abnormality is seen. Numerous small fluid signal cysts throughout the liver, benign, requiring no specific further follow-up or characterization. No gallstones, gallbladder wall thickening, or biliary dilatation. Pancreas: Lobulated, multiseptated cyst in the pancreatic uncinate measuring 1.7 x 1.3 cm closely abutting the main pancreatic duct, without overtly solid component or contrast enhancement (series 3, image 27). Additional subcentimeter fluid signal cysts in the pancreatic body and tail (series 3, image 23). No pancreatic ductal dilatation or surrounding inflammatory changes. Spleen: Normal in size without significant abnormality. Adrenals/Urinary Tract: Adrenal glands are unremarkable. Multiple simple, benign bilateral renal cortical cysts of varying size, unchanged requiring no further follow-up or characterization. Kidneys are otherwise normal, without obvious renal calculi, solid lesion, or hydronephrosis. Stomach/Bowel: Stomach is within normal limits. No evidence of bowel wall thickening, distention, or inflammatory changes. Pancolonic diverticulosis. Large burden of stool. Vascular/Lymphatic: No significant vascular findings are present. No enlarged abdominal lymph nodes. Other: No abdominal wall hernia or abnormality. No ascites. Musculoskeletal: No acute or significant osseous findings. IMPRESSION: 1. Lobulated, multiseptated cyst in the pancreatic uncinate measuring 1.7 x 1.3 cm closely abutting the main pancreatic duct, without overtly solid component or contrast enhancement. Additional subcentimeter fluid signal cysts in the pancreatic body and tail. No pancreatic ductal dilatation or surrounding inflammatory changes. These are most consistent with side branch IPMNs and unchanged on examinations dating back to at least 2020. Given size, stability, and advanced patient age, these are benign and require no further follow-up. 2. Pancolonic diverticulosis without  evidence of acute diverticulitis. 3. Large burden of stool. Electronically Signed   By: Marolyn JONETTA Jaksch M.D.   On: 08/05/2023 07:13    ASSESSMENT & PLAN HERSEY MACLELLAN 89 y.o. male with medical history significant for IgG kappa MGUS who presents for a follow up visit.  After review of the labs, review of the records, and discussion with the patient the patients findings are most consistent with a monoclonal gammopathy with concern for AL amyloidosis.  At this time findings are highly concerning for an AL amyloidosis.  We will conduct a full workup to include SPEP, UPEP, and bone marrow biopsy/fat pad biopsy.  Pending the results of the above studies will have the patient return to clinic for further discussion.  Of note the patient does have a cardiac MRI scheduled for January 2025.   # IgG Kappa Monoclonal Gammopathy # Workup Negative for AL Amyloidosis --labs today show white blood cell 3.5, hemoglobin 12.9, MCV 89.3, platelets 200 --metastatic bone survey shows no lytic lesions -- bone marrow biopsy and fat pad biopsy negative for Congo red. Findings not consistent with AL amyloidosis, appear to represent an MGUS.  --RTC in 6 months for continued monitoring of his MGUS  No orders of the defined types were placed in this encounter.   All questions were answered. The patient knows to call the clinic with any problems, questions or concerns.  A total of more than 30 minutes were spent on this encounter with face-to-face time and non-face-to-face time, including  preparing to see the patient, ordering tests and/or medications, counseling the patient and coordination of care as outlined above.   Norleen IVAR Kidney, MD Department of Hematology/Oncology [facility] Phone: [phone] Pager: [phone] Email: [email or web address]  08/16/2023 8:37 PM

## 2023-08-03 ENCOUNTER — Ambulatory Visit
Admission: RE | Admit: 2023-08-03 | Discharge: 2023-08-03 | Disposition: A | Discharge status: Home / Self Care | Payer: Self-pay | Source: Ambulatory Visit | Attending: Family Medicine | Admitting: Family Medicine

## 2023-08-03 ENCOUNTER — Inpatient Hospital Stay (HOSPITAL_BASED_OUTPATIENT_CLINIC_OR_DEPARTMENT_OTHER): Payer: Medicare Other | Admitting: Hematology and Oncology

## 2023-08-03 VITALS — BP 158/60 | HR 60 | Temp 98.3°F | Resp 13 | Wt 214.6 lb

## 2023-08-03 DIAGNOSIS — Z8349 Family history of other endocrine, nutritional and metabolic diseases: Secondary | ICD-10-CM | POA: Diagnosis not present

## 2023-08-03 DIAGNOSIS — K7689 Other specified diseases of liver: Secondary | ICD-10-CM | POA: Diagnosis not present

## 2023-08-03 DIAGNOSIS — D472 Monoclonal gammopathy: Secondary | ICD-10-CM | POA: Diagnosis not present

## 2023-08-03 DIAGNOSIS — Z806 Family history of leukemia: Secondary | ICD-10-CM | POA: Diagnosis not present

## 2023-08-03 DIAGNOSIS — N1831 Chronic kidney disease, stage 3a: Secondary | ICD-10-CM | POA: Diagnosis not present

## 2023-08-03 DIAGNOSIS — Z809 Family history of malignant neoplasm, unspecified: Secondary | ICD-10-CM | POA: Diagnosis not present

## 2023-08-03 DIAGNOSIS — Z79899 Other long term (current) drug therapy: Secondary | ICD-10-CM | POA: Diagnosis not present

## 2023-08-03 DIAGNOSIS — K862 Cyst of pancreas: Secondary | ICD-10-CM

## 2023-08-03 DIAGNOSIS — Z87891 Personal history of nicotine dependence: Secondary | ICD-10-CM | POA: Diagnosis not present

## 2023-08-03 DIAGNOSIS — K5730 Diverticulosis of large intestine without perforation or abscess without bleeding: Secondary | ICD-10-CM | POA: Diagnosis not present

## 2023-08-03 DIAGNOSIS — Z823 Family history of stroke: Secondary | ICD-10-CM | POA: Diagnosis not present

## 2023-08-03 DIAGNOSIS — Z8249 Family history of ischemic heart disease and other diseases of the circulatory system: Secondary | ICD-10-CM | POA: Diagnosis not present

## 2023-08-03 MED ORDER — GADOPICLENOL 0.5 MMOL/ML IV SOLN
10.0000 mL | Freq: Once | INTRAVENOUS | Status: AC | PRN
  Administered 2023-08-03: 10 mL via INTRAVENOUS

## 2023-08-11 DIAGNOSIS — I480 Paroxysmal atrial fibrillation: Secondary | ICD-10-CM | POA: Diagnosis not present

## 2023-08-11 DIAGNOSIS — E1122 Type 2 diabetes mellitus with diabetic chronic kidney disease: Secondary | ICD-10-CM | POA: Diagnosis not present

## 2023-08-11 DIAGNOSIS — I10 Essential (primary) hypertension: Secondary | ICD-10-CM | POA: Diagnosis not present

## 2023-08-16 ENCOUNTER — Other Ambulatory Visit (HOSPITAL_COMMUNITY): Payer: Self-pay

## 2023-08-16 ENCOUNTER — Other Ambulatory Visit: Payer: Self-pay

## 2023-08-16 NOTE — Progress Notes (Signed)
 Specialty Pharmacy Refill Coordination Note  Thomas Joseph is a 89 y.o. male contacted today regarding refills of specialty medication(s) Acoramidis  HCl (Attruby )   Patient requested Delivery   Delivery date: 08/23/23   Verified address: [address]  [name] [address]   Medication will be filled on 08/22/23.

## 2023-08-22 ENCOUNTER — Other Ambulatory Visit: Payer: Self-pay

## 2023-09-05 DIAGNOSIS — H11823 Conjunctivochalasis, bilateral: Secondary | ICD-10-CM | POA: Diagnosis not present

## 2023-09-05 DIAGNOSIS — H02834 Dermatochalasis of left upper eyelid: Secondary | ICD-10-CM | POA: Diagnosis not present

## 2023-09-05 DIAGNOSIS — H02831 Dermatochalasis of right upper eyelid: Secondary | ICD-10-CM | POA: Diagnosis not present

## 2023-09-05 DIAGNOSIS — H401131 Primary open-angle glaucoma, bilateral, mild stage: Secondary | ICD-10-CM | POA: Diagnosis not present

## 2023-09-05 DIAGNOSIS — E119 Type 2 diabetes mellitus without complications: Secondary | ICD-10-CM | POA: Diagnosis not present

## 2023-09-05 DIAGNOSIS — H11133 Conjunctival pigmentations, bilateral: Secondary | ICD-10-CM | POA: Diagnosis not present

## 2023-09-05 DIAGNOSIS — H31092 Other chorioretinal scars, left eye: Secondary | ICD-10-CM | POA: Diagnosis not present

## 2023-09-09 ENCOUNTER — Other Ambulatory Visit: Payer: Self-pay

## 2023-09-13 ENCOUNTER — Other Ambulatory Visit: Payer: Self-pay

## 2023-09-13 NOTE — Progress Notes (Signed)
 Specialty Pharmacy Refill Coordination Note  Thomas Joseph is a 89 y.o. male contacted today regarding refills of specialty medication(s) Acoramidis  HCl (Attruby )   Patient requested Delivery   Delivery date: 09/23/23   Verified address: [address]  [place] [address]   Medication will be filled on 09/22/23.

## 2023-09-21 DIAGNOSIS — R300 Dysuria: Secondary | ICD-10-CM | POA: Diagnosis not present

## 2023-09-22 ENCOUNTER — Other Ambulatory Visit: Payer: Self-pay

## 2023-10-04 ENCOUNTER — Ambulatory Visit: Admitting: Podiatry

## 2023-10-04 ENCOUNTER — Encounter: Payer: Self-pay | Admitting: Podiatry

## 2023-10-04 DIAGNOSIS — E1151 Type 2 diabetes mellitus with diabetic peripheral angiopathy without gangrene: Secondary | ICD-10-CM

## 2023-10-04 DIAGNOSIS — L84 Corns and callosities: Secondary | ICD-10-CM | POA: Diagnosis not present

## 2023-10-04 DIAGNOSIS — B351 Tinea unguium: Secondary | ICD-10-CM

## 2023-10-04 DIAGNOSIS — M79674 Pain in right toe(s): Secondary | ICD-10-CM | POA: Diagnosis not present

## 2023-10-04 DIAGNOSIS — M79675 Pain in left toe(s): Secondary | ICD-10-CM | POA: Diagnosis not present

## 2023-10-04 NOTE — Progress Notes (Unsigned)
 Subjective:  Patient ID: Thomas Joseph, male    DOB: 11/02/1933,  MRN: [id number]  GABRYEL FILES presents to clinic today for at risk foot care. Pt has h/o NIDDM with PAD and callus(es) b/l feet and painful mycotic toenails that are difficult to trim. Painful toenails interfere with ambulation. Aggravating factors include wearing enclosed shoe gear. Pain is relieved with periodic professional debridement. Painful calluses are aggravated when weightbearing with and without shoegear. Pain is relieved with periodic professional debridement. No chief complaint on file.  New problem(s): None.   PCP is Chet Mad, DO.  Allergies  Allergen Reactions   Atorvastatin Other (See Comments)    Muscle pain  Other Reaction(s): shoulder/arm pain   Hydrochlorothiazide  Other (See Comments)    Reaction not recalled, believes it just made him urinate very frequently  Other Reaction(s): ed   Metoprolol     Other reaction(s): high BP, urinary symptoms  Other Reaction(s): high BP, urinary symptoms   Tizanidine Hcl Other (See Comments)    Other Reaction(s): groggy, sleepy, hallucinations   Sildenafil Palpitations    Other reaction(s): TACHYCARDIA  Other Reaction(s): TACHYCARDIA   Viagra [Sildenafil Citrate] Palpitations    Review of Systems: Negative except as noted in the HPI.  Objective: No changes noted in today's physical examination. There were no vitals filed for this visit. AXIL COPEMAN is a pleasant 89 y.o. male in NAD. AAO x 3.  Vascular Examination: CFT <3 seconds b/l. DP pulses faintly palpable b/l. PT pulses nonpalpable b/l. Digital hair absent. Skin temperature gradient warm to warm b/l. No pain with calf compression. No ischemia or gangrene. No cyanosis or clubbing noted b/l.    Neurological Examination: Sensation grossly intact b/l with 10 gram monofilament. Pt has subjective symptoms of neuropathy. Vibratory sensation diminished b/l.  Dermatological  Examination: Pedal skin warm and supple b/l. No open wounds b/l. No interdigital macerations. Toenails b/l great toes and 3-5 b/l thick, discolored, elongated with subungual debris and pain on dorsal palpation.  Anonychia noted bilateral 2nd toes. Nailbed(s) epithelialized.    Hyperkeratotic lesion(s) submet head 5 b/l.  No erythema, no edema, no drainage, no fluctuance.  Musculoskeletal Examination: Muscle strength 5/5 to all lower extremity muscle groups bilaterally. HAV with bunion deformity noted b/l LE.  Radiographs: None  Assessment/Plan: 1. Pain due to onychomycosis of toenails of both feet   2. Callus   3. Type II diabetes mellitus with peripheral circulatory disorder (HCC)     No orders of the defined types were placed in this encounter.   None Patient was evaluated and treated. All patient's and/or POA's questions/concerns addressed on today's visit. Toenails 1-5 debrided in length and girth without incident. Callus(es) submet head 5 b/l pared with sharp debridement without incident. Continue daily foot inspections and monitor blood glucose per PCP/Endocrinologist's recommendations. Continue soft, supportive shoe gear daily. Report any pedal injuries to medical professional. Call office if there are any questions/concerns. -Patient/POA to call should there be question/concern in the interim.   Return in about 3 months (around 01/03/2024).  Delon LITTIE Merlin, DPM      [facility] LOCATION: [address], [place] [address]  Office [phone]   [facility] LOCATION: [address] [place], [place] [address] Office [phone]

## 2023-10-11 DIAGNOSIS — I480 Paroxysmal atrial fibrillation: Secondary | ICD-10-CM | POA: Diagnosis not present

## 2023-10-11 DIAGNOSIS — E1122 Type 2 diabetes mellitus with diabetic chronic kidney disease: Secondary | ICD-10-CM | POA: Diagnosis not present

## 2023-10-11 DIAGNOSIS — I10 Essential (primary) hypertension: Secondary | ICD-10-CM | POA: Diagnosis not present

## 2023-10-13 ENCOUNTER — Other Ambulatory Visit: Payer: Self-pay

## 2023-10-13 NOTE — Progress Notes (Signed)
 Specialty Pharmacy Refill Coordination Note  Thomas Joseph is a 89 y.o. male contacted today regarding refills of specialty medication(s) Acoramidis  HCl (Attruby )   Patient requested Delivery   Delivery date: 10/20/23   Verified address: [address]  [place] [address]   Medication will be filled on 10/19/23.

## 2023-10-19 ENCOUNTER — Other Ambulatory Visit: Payer: Self-pay

## 2023-10-27 NOTE — Progress Notes (Signed)
 Cardiology Office Note:  .   Date:  10/28/2023  ID:  Thomas Joseph, DOB [birth year], MRN [id number] PCP: Chet Mad, DO  [facility] Providers Cardiologist:  Darryle ONEIDA Decent, MD    History of Present Illness: .    Chief Complaint  Patient presents with   Follow-up    6 months.   Shortness of Breath   Edema    A little in his ankles.    Thomas Joseph is a 89 y.o. male with history of cardiac amyloid who presents for follow-up.    History of Present Illness   Thomas Joseph is an 89 year old male with senile TTR cardiac amyloidosis, paroxysmal atrial fibrillation, hypertension, and diabetes who presents for follow-up.  He experiences shortness of breath with physical activity, such as walking, and gets tired easily, including when walking to the clinic. He has previously undergone physical therapy, which he feels did not help much.  He is currently on Acoramidis  for his cardiac amyloidosis. He has a history of paroxysmal atrial fibrillation, but recent EKG shows normal sinus rhythm. No numbness or tingling, but mentions that his toes sometimes feel tight. No dizziness or lightheadedness.  His blood pressure at home ranges from 129 to 140 mmHg, which he monitors regularly. He is on amlodipine  10 mg twice daily and lisinopril  40mg  daily. He was previously taking Lasix  20 mg every other day but has stopped; he reports minimal leg swelling currently.  He has chronic kidney disease stage 3B, which remains stable. His kidney function has been consistent, with creatinine levels ranging from 1.5 to 1.7. He is not on blood thinners due to a previous bleeding situation.  His weight is stable at 213 pounds, and he is not experiencing significant fluid retention.          Problem List Cardiac amyloidosis, senile -genetic testing negative -PYP +  -M Spike present: negative BMBx  Paroxysmal Afib  -no AC due to GI bleed (12 unit transfusions in 2022) 3.  DVT/PE 4. DM -A1c 6.5 5. HTN 6. HLD -T chol 180, HDL 61, LDL 97, TG 123 7. GI Bleed  8. CKD 3b    ROS: All other ROS reviewed and negative. Pertinent positives noted in the HPI.     Studies Reviewed: SABRA   EKG Interpretation Date/Time:  Friday October 28 2023 08:19:58 EDT Ventricular Rate:  68 PR Interval:  184 QRS Duration:  86 QT Interval:  384 QTC Calculation: 408 R Axis:   -25  Text Interpretation: Normal sinus rhythm Normal ECG Confirmed by Decent Darryle [phone]) on 10/28/2023 8:25:15 AM   CMR 01/25/2023 IMPRESSION: 1. Findings overall suggests cardiac amyloidosis, possibly early involvement. Data is somewhat incongruent, with normal T1 myocardial nulling kinetics and low burden of delayed myocardial enhancement. However, ECV is elevated at 41% and there is diffuse myocardial edema. There is evidence of subendocardial delayed enhancement in the basal to mid LV lateral wall and basal anterior wall. There is subendocardial LGE in the right ventricular basal anterior and inferior walls. There is atrial dilation and small pericardial effusion. Consider tissue diagnosis for amyloidosis if clinically relevant.   2. Normal LV chamber size and systolic function. LVEF 59%. Mild-moderately increased LV wall thickness, 13 mm.   3.  Normal RV chamber size and systolic function.  RVEF 62%.   4.  Mild mitral and tricuspid valve regurgitation by quantitation. Physical Exam:   VS:  BP (!) 148/70 (BP Location: Right Arm, Patient Position: Sitting,  Cuff Size: Normal)   Pulse 68   Ht 6' (1.829 m)   Wt 213 lb (96.6 kg)   BMI 28.89 kg/m    Wt Readings from Last 3 Encounters:  10/28/23 213 lb (96.6 kg)  08/03/23 214 lb 9.6 oz (97.3 kg)  03/31/23 213 lb 12.8 oz (97 kg)    GEN: Well nourished, well developed in no acute distress NECK: No JVD; No carotid bruits CARDIAC: RRR, no murmurs, rubs, gallops RESPIRATORY:  Clear to auscultation without rales, wheezing or rhonchi  ABDOMEN:  Soft, non-tender, non-distended EXTREMITIES:  No edema; No deformity  ASSESSMENT AND PLAN: .   Assessment and Plan    Cardiac amyloidosis (ATTR) ATTR cardiac amyloidosis stable with acromatase. Genotype negative. No volume overload. Shortness of breath attributed to deconditioning. - Continue Acoramidis . - Encourage increased physical activity and continuation of physical therapy exercises. - Lasix  20 mg PRN  Paroxysmal atrial fibrillation No recurrence of atrial fibrillation. EKG normal sinus rhythm. Not on anticoagulation due to bleeding history.  Deconditioning Deconditioning causes shortness of breath and leg weakness. Physical therapy previously perceived as ineffective. Discussed walker use for safety. - Encourage continuation of physical therapy exercises at home. - Consider use of a walker for safety during exercise and mobility.  Chronic kidney disease stage 3b CKD stage 3b stable. Recent labs show creatinine around 1.67.  Hypertension Hypertension well-controlled with current medication. Home BP 129-140 mmHg systolic. - Continue amlodipine  10 mg daily. - Continue lisinopril  40 mg daily.              Follow-up: Return in about 6 months (around 04/27/2024).  Signed, Darryle DASEN. Barbaraann, MD, [facility]  [facility]  [address] [place], [place] [address] [phone]  8:44 AM

## 2023-10-28 ENCOUNTER — Ambulatory Visit: Attending: Cardiovascular Disease | Admitting: Cardiovascular Disease

## 2023-10-28 ENCOUNTER — Encounter: Payer: Self-pay | Admitting: Cardiovascular Disease

## 2023-10-28 VITALS — BP 148/70 | HR 68 | Ht 72.0 in | Wt 213.0 lb

## 2023-10-28 DIAGNOSIS — I480 Paroxysmal atrial fibrillation: Secondary | ICD-10-CM

## 2023-10-28 DIAGNOSIS — I43 Cardiomyopathy in diseases classified elsewhere: Secondary | ICD-10-CM | POA: Diagnosis not present

## 2023-10-28 DIAGNOSIS — E854 Organ-limited amyloidosis: Secondary | ICD-10-CM

## 2023-10-28 MED ORDER — FUROSEMIDE 20 MG PO TABS: ORAL_TABLET | ORAL | 6 refills | Status: AC

## 2023-10-28 NOTE — Patient Instructions (Signed)
 Medication Instructions:  Your physician has recommended you make the following change in your medication:   -Take furosemide  (lasix ) 20mg  as needed for lower extremity swelling.  *If you need a refill on your cardiac medications before your next appointment, please call your pharmacy*   Follow-Up: At [facility], you and your health needs are our priority.  As part of our continuing mission to provide you with exceptional heart care, our providers are all part of one team.  This team includes your primary Cardiologist (physician) and Advanced Practice Providers or APPs (Physician Assistants and Nurse Practitioners) who all work together to provide you with the care you need, when you need it.  Your next appointment:   6 month(s)  Provider:   Dr. Barbaraann   We recommend signing up for the patient portal called MyChart.  Sign up information is provided on this After Visit Summary.  MyChart is used to connect with patients for Virtual Visits (Telemedicine).  Patients are able to view lab/test results, encounter notes, upcoming appointments, etc.  Non-urgent messages can be sent to your provider as well.   To learn more about what you can do with MyChart, go to [email or web address].

## 2023-10-31 ENCOUNTER — Other Ambulatory Visit: Payer: Self-pay

## 2023-10-31 NOTE — Progress Notes (Signed)
 Specialty Pharmacy Ongoing Clinical Assessment Note  Thomas Joseph is a 89 y.o. male who is being followed by the specialty pharmacy service for RxSp Cardiology   Patient's specialty medication(s) reviewed today: Acoramidis  HCl (Attruby )   Missed doses in the last 4 weeks: 0   Patient/Caregiver did not have any additional questions or concerns.   Therapeutic benefit summary: Patient is achieving benefit   Adverse events/side effects summary: No adverse events/side effects   Patient's therapy is appropriate to: Continue    Goals Addressed             This Visit's Progress    Stabilization of disease   On track    Patient is on track. Patient will be evaluated at upcoming provider appointment to assess progress. Per OV on 10/17, patient's cardiac amyloidosis was stable.         Follow up: 12 months  [facility]

## 2023-11-09 ENCOUNTER — Other Ambulatory Visit: Payer: Self-pay

## 2023-11-11 ENCOUNTER — Other Ambulatory Visit: Payer: Self-pay

## 2023-11-11 NOTE — Progress Notes (Signed)
 Specialty Pharmacy Refill Coordination Note  Thomas Joseph is a 89 y.o. male contacted today regarding refills of specialty medication(s) Acoramidis  HCl (Attruby )   Patient requested Delivery   Delivery date: 11/16/23   Verified address: [address]  [place] [address]   Medication will be filled on: 11/15/23

## 2023-11-14 ENCOUNTER — Other Ambulatory Visit: Payer: Self-pay

## 2023-12-05 ENCOUNTER — Other Ambulatory Visit (HOSPITAL_COMMUNITY): Payer: Self-pay

## 2023-12-09 ENCOUNTER — Other Ambulatory Visit: Payer: Self-pay

## 2023-12-12 ENCOUNTER — Other Ambulatory Visit: Payer: Self-pay | Admitting: Cardiovascular Disease

## 2023-12-13 ENCOUNTER — Other Ambulatory Visit: Payer: Self-pay

## 2023-12-15 ENCOUNTER — Other Ambulatory Visit (HOSPITAL_COMMUNITY): Payer: Self-pay

## 2023-12-15 ENCOUNTER — Other Ambulatory Visit: Payer: Self-pay

## 2023-12-15 NOTE — Progress Notes (Signed)
 Specialty Pharmacy Refill Coordination Note  Thomas Joseph is a 89 y.o. male contacted today regarding refills of specialty medication(s) Acoramidis  HCl (Attruby )   Patient requested Delivery   Delivery date: 12/29/23   Verified address: [address]  [place] [address]   Medication will be filled on: 12/28/23

## 2023-12-22 ENCOUNTER — Other Ambulatory Visit (HOSPITAL_COMMUNITY): Payer: Self-pay

## 2024-01-09 ENCOUNTER — Telehealth: Payer: Self-pay | Admitting: Pharmacy Technician

## 2024-01-09 NOTE — Telephone Encounter (Signed)
" ° °  Pharmacy Patient Advocate Encounter   Received notification from Onbase that prior authorization for ATTRUBY  is required/requested.   Insurance verification completed.   The patient is insured through [facility].   Per test claim: PA required; PA submitted to above mentioned insurance via Latent Key/confirmation #/EOC [id number] Status is pending  "

## 2024-01-09 NOTE — Telephone Encounter (Signed)
 Pharmacy Patient Advocate Encounter  Received notification from [employer] that Prior Authorization for attruby  has been APPROVED from 01/09/24 to 01/10/25   PA #/Case ID/Reference #: [id number]

## 2024-01-18 ENCOUNTER — Other Ambulatory Visit: Payer: Self-pay

## 2024-01-18 ENCOUNTER — Encounter: Payer: Self-pay | Admitting: Podiatry

## 2024-01-18 ENCOUNTER — Telehealth: Payer: Self-pay

## 2024-01-18 ENCOUNTER — Ambulatory Visit: Admitting: Podiatry

## 2024-01-18 ENCOUNTER — Other Ambulatory Visit (HOSPITAL_COMMUNITY): Payer: Self-pay

## 2024-01-18 DIAGNOSIS — M79674 Pain in right toe(s): Secondary | ICD-10-CM | POA: Diagnosis not present

## 2024-01-18 DIAGNOSIS — M79675 Pain in left toe(s): Secondary | ICD-10-CM | POA: Diagnosis not present

## 2024-01-18 DIAGNOSIS — E1151 Type 2 diabetes mellitus with diabetic peripheral angiopathy without gangrene: Secondary | ICD-10-CM | POA: Diagnosis not present

## 2024-01-18 DIAGNOSIS — L84 Corns and callosities: Secondary | ICD-10-CM

## 2024-01-18 DIAGNOSIS — B351 Tinea unguium: Secondary | ICD-10-CM

## 2024-01-18 NOTE — Telephone Encounter (Signed)
 Advanced Heart Failure Patient Advocate Encounter  The patient was approved for a [employer] grant that will help cover the cost of Attruby .  Total amount awarded, $7,500.  Effective: 01/01/2024 - 12/30/2024.  BIN [id number] PCN [id number] [id number] ID [id number]  Approval and processing information added to GAILA Rachel DEL, CPhT Rx Patient Advocate Phone: [phone]

## 2024-01-19 ENCOUNTER — Other Ambulatory Visit: Payer: Self-pay

## 2024-01-19 ENCOUNTER — Other Ambulatory Visit (HOSPITAL_COMMUNITY): Payer: Self-pay

## 2024-01-19 NOTE — Progress Notes (Signed)
 Specialty Pharmacy Refill Coordination Note  Thomas Joseph is a 89 y.o. male contacted today regarding refills of specialty medication(s) Acoramidis  HCl (Attruby )   Patient requested Delivery   Delivery date: 01/27/24   Verified address: [address]  [place] [address]   Medication will be filled on: 01/26/24

## 2024-01-23 NOTE — Progress Notes (Signed)
 "  Subjective:  Patient ID: Thomas Joseph, male    DOB: [birth year],  MRN: [id number]  CASIMER RUSSETT presents to clinic today for at risk foot care. Pt has h/o NIDDM with PAD and callus(es) of both feet and painful thick toenails that are difficult to trim. Painful toenails interfere with ambulation. Aggravating factors include wearing enclosed shoe gear. Pain is relieved with periodic professional debridement. Painful calluses are aggravated when weightbearing with and without shoegear. Pain is relieved with periodic professional debridement.    New problem(s): None.   PCP is Verena Mems, MD. ARNETTA 08/11/23.  Allergies[1]  Review of Systems: Negative except as noted in the HPI.  Objective: No changes noted in today's physical examination. There were no vitals filed for this visit. BARBARA KENG is a pleasant 89 y.o. male in NAD. AAO x 3.  Vascular Examination: CFT <3 seconds b/l. DP pulses faintly palpable b/l. PT pulses nonpalpable b/l. Digital hair absent. Skin temperature gradient warm to warm b/l. No pain with calf compression. No ischemia or gangrene. No cyanosis or clubbing noted b/l.    Neurological Examination: Sensation grossly intact b/l with 10 gram monofilament. Pt has subjective symptoms of neuropathy. Vibratory sensation diminished b/l.  Dermatological Examination: Pedal skin warm and supple b/l. No open wounds b/l. No interdigital macerations. Toenails b/l great toes and 3-5 b/l thick, discolored, elongated with subungual debris and pain on dorsal palpation.  Anonychia noted bilateral 2nd toes. Nailbed(s) epithelialized.    Hyperkeratotic lesion(s) submet head 5 b/l.  No erythema, no edema, no drainage, no fluctuance.  Musculoskeletal Examination: Muscle strength 5/5 to all lower extremity muscle groups bilaterally. HAV with bunion deformity noted b/l LE. Utilizes cane for ambulation assistance.  Radiographs: None Assessment/Plan: 1. Pain due to  onychomycosis of toenails of both feet   2. Callus   3. Type II diabetes mellitus with peripheral circulatory disorder [facility])   Consent given for treatment. Patient examined. All patient's and/or POA's questions/concerns addressed on today's visit. Mycotic toenails 1-5 b/l  debrided in length and girth without incident. Callus(es) submet head 5 left foot and submet head 5 right foot pared with sharp debridement without incident. Continue foot and shoe inspections daily. Monitor blood glucose per PCP/Endocrinologist's recommendations.Continue soft, supportive shoe gear daily. Report any pedal injuries to medical professional. Call office if there are any quesitons/concerns. -Patient/POA to call should there be question/concern in the interim.   Return in about 4 months (around 05/17/2024).  Delon LITTIE Merlin, DPM      [place] LOCATION: [address] [place], [place] [address]                   Office [phone]    LOCATION: [address] [place], [place] [address] Office [phone]     [1]  Allergies Allergen Reactions   Atorvastatin Other (See Comments)    Muscle pain  Other Reaction(s): shoulder/arm pain   Hydrochlorothiazide  Other (See Comments)    Reaction not recalled, believes it just made him urinate very frequently  Other Reaction(s): ed   Metoprolol     Other reaction(s): high BP, urinary symptoms  Other Reaction(s): high BP, urinary symptoms   Tizanidine Hcl Other (See  Comments)    Other Reaction(s): groggy, sleepy, hallucinations   Sildenafil Palpitations    Other reaction(s): TACHYCARDIA  Other Reaction(s): TACHYCARDIA   Viagra [Sildenafil Citrate] Palpitations   "

## 2024-01-26 ENCOUNTER — Other Ambulatory Visit: Payer: Self-pay

## 2024-01-27 ENCOUNTER — Inpatient Hospital Stay: Attending: Physician Assistant

## 2024-01-27 DIAGNOSIS — E859 Amyloidosis, unspecified: Secondary | ICD-10-CM

## 2024-01-27 LAB — CMP (CANCER CENTER ONLY)
ALT: 13 U/L (ref 0–44)
AST: 28 U/L (ref 15–41)
Albumin: 4.2 g/dL (ref 3.5–5.0)
Alkaline Phosphatase: 102 U/L (ref 38–126)
Anion gap: 10 (ref 5–15)
BUN: 29 mg/dL — ABNORMAL HIGH (ref 8–23)
CO2: 25 mmol/L (ref 22–32)
Calcium: 9.6 mg/dL (ref 8.9–10.3)
Chloride: 104 mmol/L (ref 98–111)
Creatinine: 1.9 mg/dL — ABNORMAL HIGH (ref 0.61–1.24)
GFR, Estimated: 34 mL/min — ABNORMAL LOW
Glucose, Bld: 92 mg/dL (ref 70–99)
Potassium: 4.6 mmol/L (ref 3.5–5.1)
Sodium: 139 mmol/L (ref 135–145)
Total Bilirubin: 0.6 mg/dL (ref 0.0–1.2)
Total Protein: 7.9 g/dL (ref 6.5–8.1)

## 2024-01-27 LAB — CBC WITH DIFFERENTIAL (CANCER CENTER ONLY)
Abs Immature Granulocytes: 0.01 K/uL (ref 0.00–0.07)
Basophils Absolute: 0 K/uL (ref 0.0–0.1)
Basophils Relative: 1 %
Eosinophils Absolute: 0.2 K/uL (ref 0.0–0.5)
Eosinophils Relative: 5 %
HCT: 37.7 % — ABNORMAL LOW (ref 39.0–52.0)
Hemoglobin: 13 g/dL (ref 13.0–17.0)
Immature Granulocytes: 0 %
Lymphocytes Relative: 25 %
Lymphs Abs: 0.9 K/uL (ref 0.7–4.0)
MCH: 31.2 pg (ref 26.0–34.0)
MCHC: 34.5 g/dL (ref 30.0–36.0)
MCV: 90.4 fL (ref 80.0–100.0)
Monocytes Absolute: 0.4 K/uL (ref 0.1–1.0)
Monocytes Relative: 13 %
Neutro Abs: 1.9 K/uL (ref 1.7–7.7)
Neutrophils Relative %: 56 %
Platelet Count: 193 K/uL (ref 150–400)
RBC: 4.17 MIL/uL — ABNORMAL LOW (ref 4.22–5.81)
RDW: 14.1 % (ref 11.5–15.5)
WBC Count: 3.4 K/uL — ABNORMAL LOW (ref 4.0–10.5)
nRBC: 0 % (ref 0.0–0.2)

## 2024-01-27 LAB — LACTATE DEHYDROGENASE: LDH: 192 U/L (ref 105–235)

## 2024-01-30 LAB — KAPPA/LAMBDA LIGHT CHAINS
Kappa free light chain: 52.8 mg/L — ABNORMAL HIGH (ref 3.3–19.4)
Kappa, lambda light chain ratio: 1.58 (ref 0.26–1.65)
Lambda free light chains: 33.5 mg/L — ABNORMAL HIGH (ref 5.7–26.3)

## 2024-01-31 LAB — MULTIPLE MYELOMA PANEL, SERUM
Albumin SerPl Elph-Mcnc: 3.6 g/dL (ref 2.9–4.4)
Albumin/Glob SerPl: 1.1 (ref 0.7–1.7)
Alpha 1: 0.2 g/dL (ref 0.0–0.4)
Alpha2 Glob SerPl Elph-Mcnc: 0.7 g/dL (ref 0.4–1.0)
B-Globulin SerPl Elph-Mcnc: 1.1 g/dL (ref 0.7–1.3)
Gamma Glob SerPl Elph-Mcnc: 1.5 g/dL (ref 0.4–1.8)
Globulin, Total: 3.5 g/dL (ref 2.2–3.9)
IgA: 444 mg/dL — ABNORMAL HIGH (ref 61–437)
IgG (Immunoglobin G), Serum: 1668 mg/dL — ABNORMAL HIGH (ref 603–1613)
IgM (Immunoglobulin M), Srm: 59 mg/dL (ref 15–143)
M Protein SerPl Elph-Mcnc: 0.8 g/dL — ABNORMAL HIGH
Total Protein ELP: 7.1 g/dL (ref 6.0–8.5)

## 2024-02-01 ENCOUNTER — Telehealth: Payer: Self-pay | Admitting: Hematology and Oncology

## 2024-02-01 NOTE — Telephone Encounter (Signed)
 I spoke to spouse about rescheduling upcoming appt

## 2024-02-03 ENCOUNTER — Other Ambulatory Visit

## 2024-02-03 ENCOUNTER — Ambulatory Visit: Admitting: Physician Assistant

## 2024-02-03 ENCOUNTER — Inpatient Hospital Stay: Admitting: Physician Assistant

## 2024-02-16 ENCOUNTER — Other Ambulatory Visit (HOSPITAL_COMMUNITY): Payer: Self-pay

## 2024-02-16 ENCOUNTER — Other Ambulatory Visit: Payer: Self-pay | Admitting: Cardiovascular Disease

## 2024-02-17 ENCOUNTER — Other Ambulatory Visit: Payer: Self-pay | Admitting: Pharmacy Technician

## 2024-02-17 ENCOUNTER — Other Ambulatory Visit: Payer: Self-pay

## 2024-02-17 MED ORDER — ATTRUBY 356 MG PO TBPK
2.0000 | ORAL_TABLET | Freq: Two times a day (BID) | ORAL | 12 refills | Status: AC
  Filled 2024-02-17 (×2): qty 120, 30d supply, fill #0

## 2024-02-17 NOTE — Progress Notes (Signed)
 Specialty Pharmacy Refill Coordination Note  Thomas Joseph is a 89 y.o. male contacted today regarding refills of specialty medication(s) Acoramidis  HCl (Attruby )   Patient requested Delivery   Delivery date: 02/22/24   Verified address: [address]  [place] [address]   Medication will be filled on: 02/21/24

## 2024-02-20 ENCOUNTER — Inpatient Hospital Stay: Admitting: Hematology and Oncology

## 2024-05-16 ENCOUNTER — Ambulatory Visit: Admitting: Podiatry
